# Patient Record
Sex: Female | Born: 1965 | Race: White | Hispanic: No | Marital: Single | Smoking: Current every day smoker
Health system: Southern US, Community
[De-identification: ages and names within clinical notes are randomized; demographics above are authoritative.]

## PROBLEM LIST (undated history)

## (undated) ENCOUNTER — Emergency Department (HOSPITAL_COMMUNITY): Disposition: A | Discharge status: Home / Self Care | Payer: Medicaid Other

## (undated) DIAGNOSIS — M51369 Other intervertebral disc degeneration, lumbar region without mention of lumbar back pain or lower extremity pain: Secondary | ICD-10-CM

## (undated) DIAGNOSIS — J449 Chronic obstructive pulmonary disease, unspecified: Secondary | ICD-10-CM

## (undated) DIAGNOSIS — J45909 Unspecified asthma, uncomplicated: Secondary | ICD-10-CM

## (undated) DIAGNOSIS — M5136 Other intervertebral disc degeneration, lumbar region: Secondary | ICD-10-CM

## (undated) DIAGNOSIS — N200 Calculus of kidney: Secondary | ICD-10-CM

## (undated) DIAGNOSIS — G4700 Insomnia, unspecified: Secondary | ICD-10-CM

## (undated) DIAGNOSIS — Z8711 Personal history of peptic ulcer disease: Secondary | ICD-10-CM

## (undated) DIAGNOSIS — M357 Hypermobility syndrome: Secondary | ICD-10-CM

## (undated) DIAGNOSIS — F419 Anxiety disorder, unspecified: Secondary | ICD-10-CM

## (undated) DIAGNOSIS — K219 Gastro-esophageal reflux disease without esophagitis: Secondary | ICD-10-CM

## (undated) DIAGNOSIS — M5126 Other intervertebral disc displacement, lumbar region: Secondary | ICD-10-CM

## (undated) DIAGNOSIS — M5430 Sciatica, unspecified side: Secondary | ICD-10-CM

## (undated) DIAGNOSIS — K589 Irritable bowel syndrome without diarrhea: Secondary | ICD-10-CM

## (undated) DIAGNOSIS — R531 Weakness: Secondary | ICD-10-CM

## (undated) DIAGNOSIS — M5431 Sciatica, right side: Secondary | ICD-10-CM

## (undated) DIAGNOSIS — M2550 Pain in unspecified joint: Secondary | ICD-10-CM

## (undated) DIAGNOSIS — J189 Pneumonia, unspecified organism: Secondary | ICD-10-CM

## (undated) HISTORY — PX: SHOULDER SURGERY: SHX246

## (undated) HISTORY — PX: TUBAL LIGATION: SHX77

## (undated) HISTORY — PX: ABDOMINAL HYSTERECTOMY: SHX81

## (undated) HISTORY — PX: TONSILLECTOMY: SUR1361

---

## 2007-12-19 ENCOUNTER — Emergency Department: Payer: Self-pay | Admitting: Emergency Medicine

## 2008-01-31 ENCOUNTER — Emergency Department: Payer: Self-pay | Admitting: Emergency Medicine

## 2008-04-19 ENCOUNTER — Emergency Department: Payer: Self-pay | Admitting: Internal Medicine

## 2008-05-17 ENCOUNTER — Emergency Department: Payer: Self-pay | Admitting: Emergency Medicine

## 2008-05-31 ENCOUNTER — Emergency Department: Payer: Self-pay | Admitting: Emergency Medicine

## 2008-06-18 ENCOUNTER — Emergency Department: Payer: Self-pay | Admitting: Emergency Medicine

## 2008-07-25 ENCOUNTER — Ambulatory Visit: Payer: Self-pay

## 2008-12-16 ENCOUNTER — Emergency Department: Payer: Self-pay | Admitting: Emergency Medicine

## 2009-02-01 ENCOUNTER — Emergency Department: Payer: Self-pay | Admitting: Internal Medicine

## 2009-02-21 ENCOUNTER — Observation Stay: Payer: Self-pay | Admitting: Psychiatry

## 2009-02-25 ENCOUNTER — Emergency Department: Payer: Self-pay | Admitting: Emergency Medicine

## 2009-03-24 ENCOUNTER — Emergency Department: Payer: Self-pay | Admitting: Emergency Medicine

## 2009-04-04 ENCOUNTER — Emergency Department: Payer: Self-pay | Admitting: Emergency Medicine

## 2009-11-10 ENCOUNTER — Inpatient Hospital Stay: Payer: Self-pay | Admitting: Surgery

## 2009-11-21 ENCOUNTER — Observation Stay: Payer: Self-pay | Admitting: Internal Medicine

## 2010-10-16 ENCOUNTER — Emergency Department: Payer: Self-pay | Admitting: Internal Medicine

## 2010-12-12 ENCOUNTER — Emergency Department: Payer: Self-pay | Admitting: Emergency Medicine

## 2011-03-19 ENCOUNTER — Emergency Department: Payer: Self-pay | Admitting: Unknown Physician Specialty

## 2011-03-31 ENCOUNTER — Ambulatory Visit: Payer: Self-pay | Admitting: Internal Medicine

## 2011-05-30 ENCOUNTER — Emergency Department: Payer: Self-pay | Admitting: Internal Medicine

## 2011-05-30 LAB — COMPREHENSIVE METABOLIC PANEL
Albumin: 3.8 g/dL (ref 3.4–5.0)
Anion Gap: 13 (ref 7–16)
BUN: 14 mg/dL (ref 7–18)
Bilirubin,Total: 0.4 mg/dL (ref 0.2–1.0)
Chloride: 111 mmol/L — ABNORMAL HIGH (ref 98–107)
Creatinine: 0.76 mg/dL (ref 0.60–1.30)
EGFR (African American): >60
Glucose: 109 mg/dL — ABNORMAL HIGH (ref 65–99)
Osmolality: 288 (ref 275–301)
Potassium: 3.6 mmol/L (ref 3.5–5.1)
Sodium: 144 mmol/L (ref 136–145)
Total Protein: 7 g/dL (ref 6.4–8.2)

## 2011-05-30 LAB — URINALYSIS, COMPLETE
Bacteria: NONE SEEN
Bilirubin,UR: NEGATIVE
Glucose,UR: NEGATIVE mg/dL (ref 0–75)
Ketone: NEGATIVE
Ph: 5 (ref 4.5–8.0)
Protein: 30
RBC,UR: 3 /HPF (ref 0–5)
Specific Gravity: 1.029 (ref 1.003–1.030)
Squamous Epithelial: 3
WBC UR: 1 /HPF (ref 0–5)

## 2011-05-30 LAB — CBC
HCT: 41.6 % (ref 35.0–47.0)
HGB: 14.3 g/dL (ref 12.0–16.0)
MCH: 33.4 pg (ref 26.0–34.0)
RBC: 4.26 10*6/uL (ref 3.80–5.20)
RDW: 12.7 % (ref 11.5–14.5)
WBC: 11.5 10*3/uL — ABNORMAL HIGH (ref 3.6–11.0)

## 2011-05-30 LAB — TROPONIN I: Troponin-I: <0.02 ng/mL

## 2011-06-26 ENCOUNTER — Emergency Department: Payer: Self-pay | Admitting: Emergency Medicine

## 2011-06-26 LAB — COMPREHENSIVE METABOLIC PANEL
Albumin: 4.5 g/dL (ref 3.4–5.0)
Alkaline Phosphatase: 70 U/L (ref 50–136)
Anion Gap: 12 (ref 7–16)
BUN: 11 mg/dL (ref 7–18)
Bilirubin,Total: 0.6 mg/dL (ref 0.2–1.0)
Calcium, Total: 9.4 mg/dL (ref 8.5–10.1)
Co2: 23 mmol/L (ref 21–32)
Creatinine: 0.89 mg/dL (ref 0.60–1.30)
EGFR (African American): >60
EGFR (Non-African Amer.): >60
Glucose: 104 mg/dL — ABNORMAL HIGH (ref 65–99)
Osmolality: 281 (ref 275–301)
Potassium: 3.8 mmol/L (ref 3.5–5.1)
SGOT(AST): 24 U/L (ref 15–37)
SGPT (ALT): 16 U/L
Sodium: 141 mmol/L (ref 136–145)
Total Protein: 8.3 g/dL — ABNORMAL HIGH (ref 6.4–8.2)

## 2011-06-26 LAB — URINALYSIS, COMPLETE
Bilirubin,UR: NEGATIVE
Blood: NEGATIVE
Glucose,UR: NEGATIVE mg/dL (ref 0–75)
Leukocyte Esterase: NEGATIVE
Nitrite: NEGATIVE
Ph: 6 (ref 4.5–8.0)
RBC,UR: <1 /HPF (ref 0–5)

## 2011-06-26 LAB — CBC
HGB: 16.4 g/dL — ABNORMAL HIGH (ref 12.0–16.0)
MCH: 33.3 pg (ref 26.0–34.0)
Platelet: 309 10*3/uL (ref 150–440)
RBC: 4.93 10*6/uL (ref 3.80–5.20)
RDW: 12.6 % (ref 11.5–14.5)
WBC: 18.4 10*3/uL — ABNORMAL HIGH (ref 3.6–11.0)

## 2011-06-26 LAB — PREGNANCY, URINE: Pregnancy Test, Urine: NEGATIVE m[IU]/mL

## 2011-10-26 ENCOUNTER — Inpatient Hospital Stay: Payer: Self-pay | Admitting: Internal Medicine

## 2011-10-26 LAB — ETHANOL
Ethanol %: <0.003 % (ref 0.000–0.080)
Ethanol: <3 mg/dL

## 2011-10-26 LAB — COMPREHENSIVE METABOLIC PANEL
Albumin: 3.8 g/dL (ref 3.4–5.0)
Bilirubin,Total: 0.4 mg/dL (ref 0.2–1.0)
Calcium, Total: 8.5 mg/dL (ref 8.5–10.1)
Chloride: 110 mmol/L — ABNORMAL HIGH (ref 98–107)
Co2: 26 mmol/L (ref 21–32)
Creatinine: 0.77 mg/dL (ref 0.60–1.30)
EGFR (African American): >60
EGFR (Non-African Amer.): >60
SGOT(AST): 44 U/L — ABNORMAL HIGH (ref 15–37)
SGPT (ALT): 17 U/L

## 2011-10-26 LAB — URINALYSIS, COMPLETE
Bacteria: NONE SEEN
Bilirubin,UR: NEGATIVE
Ketone: NEGATIVE
Leukocyte Esterase: NEGATIVE
Ph: 6 (ref 4.5–8.0)
Protein: NEGATIVE
Squamous Epithelial: 1
WBC UR: 2 /HPF (ref 0–5)

## 2011-10-26 LAB — PREGNANCY, URINE: Pregnancy Test, Urine: NEGATIVE m[IU]/mL

## 2011-10-26 LAB — CBC
MCH: 33 pg (ref 26.0–34.0)
MCV: 101 fL — ABNORMAL HIGH (ref 80–100)
Platelet: 295 10*3/uL (ref 150–440)
RDW: 12.8 % (ref 11.5–14.5)
WBC: 9.2 10*3/uL (ref 3.6–11.0)

## 2011-10-26 LAB — TSH: Thyroid Stimulating Horm: 0.975 u[IU]/mL

## 2011-10-26 LAB — DRUG SCREEN, URINE
Amphetamines, Ur Screen: NEGATIVE (ref ?–1000)
Cannabinoid 50 Ng, Ur ~~LOC~~: POSITIVE (ref ?–50)
Cocaine Metabolite,Ur ~~LOC~~: NEGATIVE (ref ?–300)
MDMA (Ecstasy)Ur Screen: NEGATIVE (ref ?–500)
Methadone, Ur Screen: NEGATIVE (ref ?–300)
Tricyclic, Ur Screen: NEGATIVE (ref ?–1000)

## 2011-10-26 LAB — TROPONIN I
Troponin-I: <0.02 ng/mL
Troponin-I: 0.02 ng/mL

## 2011-10-26 LAB — CK TOTAL AND CKMB (NOT AT ARMC): CK-MB: 24.1 ng/mL — ABNORMAL HIGH (ref 0.5–3.6)

## 2011-10-26 LAB — MAGNESIUM: Magnesium: 1.8 mg/dL

## 2011-10-26 LAB — APTT: Activated PTT: 27.1 secs (ref 23.6–35.9)

## 2011-10-27 LAB — TROPONIN I: Troponin-I: 0.06 ng/mL — ABNORMAL HIGH

## 2011-10-27 LAB — CBC WITH DIFFERENTIAL/PLATELET
Basophil #: 0.1 10*3/uL (ref 0.0–0.1)
Basophil %: 0.6 %
Eosinophil #: 0.2 10*3/uL (ref 0.0–0.7)
Eosinophil %: 1.6 %
HCT: 33.5 % — ABNORMAL LOW (ref 35.0–47.0)
Lymphocyte #: 3.1 10*3/uL (ref 1.0–3.6)
Lymphocyte %: 32.1 %
MCH: 34.3 pg — ABNORMAL HIGH (ref 26.0–34.0)
MCHC: 33.7 g/dL (ref 32.0–36.0)
MCV: 102 fL — ABNORMAL HIGH (ref 80–100)
Monocyte #: 0.7 x10 3/mm (ref 0.2–0.9)
Neutrophil #: 5.7 10*3/uL (ref 1.4–6.5)
Platelet: 229 10*3/uL (ref 150–440)
RDW: 12.8 % (ref 11.5–14.5)

## 2011-10-27 LAB — COMPREHENSIVE METABOLIC PANEL
Alkaline Phosphatase: 76 U/L (ref 50–136)
BUN: 7 mg/dL (ref 7–18)
Bilirubin,Total: 0.3 mg/dL (ref 0.2–1.0)
Calcium, Total: 8 mg/dL — ABNORMAL LOW (ref 8.5–10.1)
Chloride: 115 mmol/L — ABNORMAL HIGH (ref 98–107)
Co2: 25 mmol/L (ref 21–32)
Creatinine: 1.02 mg/dL (ref 0.60–1.30)
EGFR (Non-African Amer.): >60
Glucose: 82 mg/dL (ref 65–99)
SGOT(AST): 55 U/L — ABNORMAL HIGH (ref 15–37)
SGPT (ALT): 17 U/L
Total Protein: 5.8 g/dL — ABNORMAL LOW (ref 6.4–8.2)

## 2011-10-28 LAB — LIPID PANEL
Cholesterol: 160 mg/dL (ref 0–200)
HDL Cholesterol: 23 mg/dL — ABNORMAL LOW (ref 40–60)
Ldl Cholesterol, Calc: 68 mg/dL (ref 0–100)
VLDL Cholesterol, Calc: 69 mg/dL — ABNORMAL HIGH (ref 5–40)

## 2011-10-28 LAB — CBC WITH DIFFERENTIAL/PLATELET
Basophil %: 0.5 %
Eosinophil #: 0.5 10*3/uL (ref 0.0–0.7)
Eosinophil %: 5.6 %
HGB: 11.8 g/dL — ABNORMAL LOW (ref 12.0–16.0)
Lymphocyte %: 47.6 %
MCHC: 33.4 g/dL (ref 32.0–36.0)
Monocyte #: 0.6 x10 3/mm (ref 0.2–0.9)
Neutrophil #: 3.6 10*3/uL (ref 1.4–6.5)
Neutrophil %: 39.9 %
Platelet: 237 10*3/uL (ref 150–440)
RDW: 12.6 % (ref 11.5–14.5)

## 2011-10-28 LAB — COMPREHENSIVE METABOLIC PANEL
Albumin: 3.2 g/dL — ABNORMAL LOW (ref 3.4–5.0)
Alkaline Phosphatase: 117 U/L (ref 50–136)
Anion Gap: 7 (ref 7–16)
BUN: 12 mg/dL (ref 7–18)
Calcium, Total: 8.5 mg/dL (ref 8.5–10.1)
Co2: 25 mmol/L (ref 21–32)
Creatinine: 1.01 mg/dL (ref 0.60–1.30)
EGFR (Non-African Amer.): >60
Glucose: 86 mg/dL (ref 65–99)
SGOT(AST): 54 U/L — ABNORMAL HIGH (ref 15–37)
SGPT (ALT): 26 U/L
Total Protein: 6.4 g/dL (ref 6.4–8.2)

## 2011-10-28 LAB — CK: CK, Total: 985 U/L — ABNORMAL HIGH (ref 21–215)

## 2011-10-29 LAB — CK: CK, Total: 530 U/L — ABNORMAL HIGH (ref 21–215)

## 2011-10-29 LAB — TROPONIN I: Troponin-I: 0.02 ng/mL

## 2011-10-29 LAB — HEPATIC FUNCTION PANEL A (ARMC)
Albumin: 3.1 g/dL — ABNORMAL LOW (ref 3.4–5.0)
Alkaline Phosphatase: 99 U/L (ref 50–136)
SGOT(AST): 53 U/L — ABNORMAL HIGH (ref 15–37)
Total Protein: 6.3 g/dL — ABNORMAL LOW (ref 6.4–8.2)

## 2011-10-30 LAB — SGOT (AST)(ARMC): SGOT(AST): 48 U/L — ABNORMAL HIGH (ref 15–37)

## 2011-10-30 LAB — CK: CK, Total: 682 U/L — ABNORMAL HIGH (ref 21–215)

## 2011-10-31 ENCOUNTER — Inpatient Hospital Stay: Payer: Self-pay | Admitting: Psychiatry

## 2011-10-31 LAB — SGOT (AST)(ARMC): SGOT(AST): 103 U/L — ABNORMAL HIGH (ref 15–37)

## 2011-11-14 ENCOUNTER — Emergency Department: Payer: Self-pay | Admitting: *Deleted

## 2011-11-14 LAB — BASIC METABOLIC PANEL
Anion Gap: 6 — ABNORMAL LOW (ref 7–16)
Calcium, Total: 8.3 mg/dL — ABNORMAL LOW (ref 8.5–10.1)
Chloride: 111 mmol/L — ABNORMAL HIGH (ref 98–107)
Co2: 26 mmol/L (ref 21–32)
Creatinine: 0.72 mg/dL (ref 0.60–1.30)
EGFR (African American): >60
EGFR (Non-African Amer.): >60
Osmolality: 283 (ref 275–301)

## 2011-11-14 LAB — CBC
MCV: 101 fL — ABNORMAL HIGH (ref 80–100)
Platelet: 308 10*3/uL (ref 150–440)
RBC: 3.56 10*6/uL — ABNORMAL LOW (ref 3.80–5.20)
RDW: 12.7 % (ref 11.5–14.5)
WBC: 9.2 10*3/uL (ref 3.6–11.0)

## 2011-11-14 LAB — CK TOTAL AND CKMB (NOT AT ARMC)
CK, Total: 66 U/L (ref 21–215)
CK-MB: 0.5 ng/mL (ref 0.5–3.6)

## 2011-11-14 LAB — TROPONIN I: Troponin-I: <0.02 ng/mL

## 2012-02-06 ENCOUNTER — Emergency Department: Payer: Self-pay | Admitting: Emergency Medicine

## 2012-02-06 LAB — COMPREHENSIVE METABOLIC PANEL
Anion Gap: 8 (ref 7–16)
BUN: 12 mg/dL (ref 7–18)
Chloride: 107 mmol/L (ref 98–107)
Co2: 24 mmol/L (ref 21–32)
Creatinine: 0.74 mg/dL (ref 0.60–1.30)
EGFR (African American): >60
EGFR (Non-African Amer.): >60
Potassium: 4 mmol/L (ref 3.5–5.1)
SGOT(AST): 18 U/L (ref 15–37)
SGPT (ALT): 19 U/L (ref 12–78)
Total Protein: 8.3 g/dL — ABNORMAL HIGH (ref 6.4–8.2)

## 2012-02-06 LAB — CBC
HCT: 44.7 % (ref 35.0–47.0)
HGB: 15.5 g/dL (ref 12.0–16.0)
MCH: 33.4 pg (ref 26.0–34.0)
MCHC: 34.7 g/dL (ref 32.0–36.0)
MCV: 96 fL (ref 80–100)

## 2012-02-06 LAB — URINALYSIS, COMPLETE
Blood: NEGATIVE
Glucose,UR: NEGATIVE mg/dL (ref 0–75)
Ketone: NEGATIVE
Leukocyte Esterase: NEGATIVE
Nitrite: NEGATIVE
Ph: 7 (ref 4.5–8.0)
Protein: NEGATIVE
RBC,UR: 1 /HPF (ref 0–5)

## 2012-02-09 ENCOUNTER — Emergency Department: Payer: Self-pay | Admitting: Unknown Physician Specialty

## 2012-02-09 LAB — URINALYSIS, COMPLETE
Bacteria: NONE SEEN
Bilirubin,UR: NEGATIVE
Glucose,UR: NEGATIVE mg/dL (ref 0–75)
Ketone: NEGATIVE
Nitrite: NEGATIVE
Specific Gravity: 1.015 (ref 1.003–1.030)
WBC UR: 1 /HPF (ref 0–5)

## 2012-02-09 LAB — COMPREHENSIVE METABOLIC PANEL
Albumin: 4 g/dL (ref 3.4–5.0)
Anion Gap: 7 (ref 7–16)
Calcium, Total: 8.8 mg/dL (ref 8.5–10.1)
Co2: 26 mmol/L (ref 21–32)
Creatinine: 0.87 mg/dL (ref 0.60–1.30)
EGFR (African American): >60
EGFR (Non-African Amer.): >60
Glucose: 133 mg/dL — ABNORMAL HIGH (ref 65–99)
SGOT(AST): 17 U/L (ref 15–37)
SGPT (ALT): 14 U/L (ref 12–78)
Sodium: 143 mmol/L (ref 136–145)
Total Protein: 7.5 g/dL (ref 6.4–8.2)

## 2012-02-09 LAB — LIPASE, BLOOD: Lipase: 85 U/L (ref 73–393)

## 2012-02-09 LAB — CBC
MCH: 32.9 pg (ref 26.0–34.0)
MCHC: 34.2 g/dL (ref 32.0–36.0)
MCV: 96 fL (ref 80–100)
RBC: 4.59 10*6/uL (ref 3.80–5.20)
RDW: 12.8 % (ref 11.5–14.5)

## 2013-09-21 ENCOUNTER — Emergency Department: Payer: Self-pay | Admitting: Emergency Medicine

## 2013-09-21 LAB — URINALYSIS, COMPLETE
BACTERIA: NONE SEEN
Bilirubin,UR: NEGATIVE
Blood: NEGATIVE
GLUCOSE, UR: NEGATIVE mg/dL (ref 0–75)
Ketone: NEGATIVE
Leukocyte Esterase: NEGATIVE
Nitrite: NEGATIVE
PROTEIN: NEGATIVE
Ph: 6 (ref 4.5–8.0)
RBC,UR: <1 /HPF (ref 0–5)
Specific Gravity: 1.009 (ref 1.003–1.030)

## 2014-01-15 ENCOUNTER — Ambulatory Visit: Payer: Self-pay | Admitting: Otolaryngology

## 2014-03-03 ENCOUNTER — Emergency Department: Payer: Self-pay | Admitting: Emergency Medicine

## 2014-03-03 LAB — CBC
HCT: 42.9 % (ref 35.0–47.0)
HGB: 14.5 g/dL (ref 12.0–16.0)
MCH: 33.5 pg (ref 26.0–34.0)
MCHC: 33.8 g/dL (ref 32.0–36.0)
MCV: 99 fL (ref 80–100)
Platelet: 392 10*3/uL (ref 150–440)
RBC: 4.33 10*6/uL (ref 3.80–5.20)
RDW: 13 % (ref 11.5–14.5)
WBC: 9.7 10*3/uL (ref 3.6–11.0)

## 2014-03-03 LAB — COMPREHENSIVE METABOLIC PANEL
ALBUMIN: 3.8 g/dL (ref 3.4–5.0)
Alkaline Phosphatase: 92 U/L
Anion Gap: 10 (ref 7–16)
BILIRUBIN TOTAL: 0.6 mg/dL (ref 0.2–1.0)
BUN: 14 mg/dL (ref 7–18)
CHLORIDE: 109 mmol/L — AB (ref 98–107)
CO2: 24 mmol/L (ref 21–32)
Calcium, Total: 8.7 mg/dL (ref 8.5–10.1)
Creatinine: 0.73 mg/dL (ref 0.60–1.30)
EGFR (Non-African Amer.): >60
Glucose: 107 mg/dL — ABNORMAL HIGH (ref 65–99)
OSMOLALITY: 286 (ref 275–301)
POTASSIUM: 3.9 mmol/L (ref 3.5–5.1)
SGOT(AST): 29 U/L (ref 15–37)
SGPT (ALT): 18 U/L
SODIUM: 143 mmol/L (ref 136–145)
Total Protein: 7.3 g/dL (ref 6.4–8.2)

## 2014-03-03 LAB — LIPASE, BLOOD: Lipase: 120 U/L (ref 73–393)

## 2014-03-05 ENCOUNTER — Emergency Department: Payer: Self-pay | Admitting: Emergency Medicine

## 2014-03-05 LAB — URINALYSIS, COMPLETE
BILIRUBIN, UR: NEGATIVE
BLOOD: NEGATIVE
GLUCOSE, UR: NEGATIVE mg/dL (ref 0–75)
Ketone: NEGATIVE
LEUKOCYTE ESTERASE: NEGATIVE
Nitrite: NEGATIVE
PROTEIN: NEGATIVE
Ph: 6 (ref 4.5–8.0)
RBC,UR: 2 /HPF (ref 0–5)
Specific Gravity: 1.017 (ref 1.003–1.030)
WBC UR: 2 /HPF (ref 0–5)

## 2014-03-05 LAB — COMPREHENSIVE METABOLIC PANEL
Albumin: 4 g/dL (ref 3.4–5.0)
Alkaline Phosphatase: 86 U/L
Anion Gap: 8 (ref 7–16)
BUN: 14 mg/dL (ref 7–18)
Bilirubin,Total: 0.5 mg/dL (ref 0.2–1.0)
Calcium, Total: 8.7 mg/dL (ref 8.5–10.1)
Chloride: 107 mmol/L (ref 98–107)
Co2: 24 mmol/L (ref 21–32)
Creatinine: 0.89 mg/dL (ref 0.60–1.30)
EGFR (African American): >60
EGFR (Non-African Amer.): >60
Glucose: 138 mg/dL — ABNORMAL HIGH (ref 65–99)
Osmolality: 280 (ref 275–301)
Potassium: 3.4 mmol/L — ABNORMAL LOW (ref 3.5–5.1)
SGOT(AST): 9 U/L — ABNORMAL LOW (ref 15–37)
SGPT (ALT): 16 U/L
Sodium: 139 mmol/L (ref 136–145)
Total Protein: 7.1 g/dL (ref 6.4–8.2)

## 2014-03-05 LAB — CBC WITH DIFFERENTIAL/PLATELET
BASOS ABS: 0.1 10*3/uL (ref 0.0–0.1)
Basophil %: 0.8 %
EOS PCT: 0.2 %
Eosinophil #: 0 10*3/uL (ref 0.0–0.7)
HCT: 42.9 % (ref 35.0–47.0)
HGB: 14.3 g/dL (ref 12.0–16.0)
LYMPHS ABS: 2.7 10*3/uL (ref 1.0–3.6)
Lymphocyte %: 20.3 %
MCH: 32.8 pg (ref 26.0–34.0)
MCHC: 33.3 g/dL (ref 32.0–36.0)
MCV: 99 fL (ref 80–100)
MONO ABS: 0.8 x10 3/mm (ref 0.2–0.9)
Monocyte %: 5.6 %
NEUTROS ABS: 9.9 10*3/uL — AB (ref 1.4–6.5)
Neutrophil %: 73.1 %
PLATELETS: 426 10*3/uL (ref 150–440)
RBC: 4.35 10*6/uL (ref 3.80–5.20)
RDW: 12.8 % (ref 11.5–14.5)
WBC: 13.6 10*3/uL — ABNORMAL HIGH (ref 3.6–11.0)

## 2014-03-05 LAB — LIPASE, BLOOD: Lipase: 77 U/L (ref 73–393)

## 2014-03-07 ENCOUNTER — Inpatient Hospital Stay: Payer: Self-pay | Admitting: Internal Medicine

## 2014-03-07 LAB — COMPREHENSIVE METABOLIC PANEL
ALBUMIN: 3.9 g/dL (ref 3.4–5.0)
Alkaline Phosphatase: 83 U/L
Anion Gap: 10 (ref 7–16)
BILIRUBIN TOTAL: 0.8 mg/dL (ref 0.2–1.0)
BUN: 13 mg/dL (ref 7–18)
CO2: 23 mmol/L (ref 21–32)
Calcium, Total: 8.8 mg/dL (ref 8.5–10.1)
Chloride: 110 mmol/L — ABNORMAL HIGH (ref 98–107)
Creatinine: 1.1 mg/dL (ref 0.60–1.30)
GFR CALC NON AF AMER: 57 — AB
Glucose: 103 mg/dL — ABNORMAL HIGH (ref 65–99)
Osmolality: 285 (ref 275–301)
Potassium: 3.2 mmol/L — ABNORMAL LOW (ref 3.5–5.1)
SGOT(AST): 49 U/L — ABNORMAL HIGH (ref 15–37)
SGPT (ALT): 18 U/L
SODIUM: 143 mmol/L (ref 136–145)
TOTAL PROTEIN: 6.8 g/dL (ref 6.4–8.2)

## 2014-03-07 LAB — URINALYSIS, COMPLETE
BLOOD: NEGATIVE
Bilirubin,UR: NEGATIVE
Glucose,UR: NEGATIVE mg/dL (ref 0–75)
Ketone: NEGATIVE
Leukocyte Esterase: NEGATIVE
Nitrite: NEGATIVE
PH: 5 (ref 4.5–8.0)
Protein: NEGATIVE
RBC,UR: 1 /HPF (ref 0–5)
Specific Gravity: 1.008 (ref 1.003–1.030)
WBC UR: <1 /HPF (ref 0–5)

## 2014-03-07 LAB — DRUG SCREEN, URINE
Amphetamines, Ur Screen: NEGATIVE (ref ?–1000)
BARBITURATES, UR SCREEN: POSITIVE (ref ?–200)
Benzodiazepine, Ur Scrn: POSITIVE (ref ?–200)
CANNABINOID 50 NG, UR ~~LOC~~: NEGATIVE (ref ?–50)
Cocaine Metabolite,Ur ~~LOC~~: NEGATIVE (ref ?–300)
MDMA (ECSTASY) UR SCREEN: NEGATIVE (ref ?–500)
Methadone, Ur Screen: NEGATIVE (ref ?–300)
Opiate, Ur Screen: NEGATIVE (ref ?–300)
Phencyclidine (PCP) Ur S: NEGATIVE (ref ?–25)
TRICYCLIC, UR SCREEN: NEGATIVE (ref ?–1000)

## 2014-03-07 LAB — CBC WITH DIFFERENTIAL/PLATELET
Basophil #: 0.1 10*3/uL (ref 0.0–0.1)
Basophil %: 0.4 %
Eosinophil #: 0 10*3/uL (ref 0.0–0.7)
Eosinophil %: 0.1 %
HCT: 41.9 % (ref 35.0–47.0)
HGB: 13.9 g/dL (ref 12.0–16.0)
LYMPHS ABS: 1.4 10*3/uL (ref 1.0–3.6)
Lymphocyte %: 6.9 %
MCH: 32.8 pg (ref 26.0–34.0)
MCHC: 33.3 g/dL (ref 32.0–36.0)
MCV: 99 fL (ref 80–100)
Monocyte #: 1.1 x10 3/mm — ABNORMAL HIGH (ref 0.2–0.9)
Monocyte %: 5.5 %
Neutrophil #: 18 10*3/uL — ABNORMAL HIGH (ref 1.4–6.5)
Neutrophil %: 87.1 %
PLATELETS: 304 10*3/uL (ref 150–440)
RBC: 4.25 10*6/uL (ref 3.80–5.20)
RDW: 13.1 % (ref 11.5–14.5)
WBC: 20.6 10*3/uL — ABNORMAL HIGH (ref 3.6–11.0)

## 2014-03-07 LAB — SALICYLATE LEVEL: Salicylates, Serum: 2.7 mg/dL

## 2014-03-07 LAB — MAGNESIUM: Magnesium: 1.6 mg/dL — ABNORMAL LOW

## 2014-03-07 LAB — ACETAMINOPHEN LEVEL

## 2014-03-07 LAB — ETHANOL: Ethanol: <3 mg/dL

## 2014-03-08 LAB — CBC WITH DIFFERENTIAL/PLATELET
BASOS ABS: 0.1 10*3/uL (ref 0.0–0.1)
Basophil %: 0.8 %
EOS PCT: 1.7 %
Eosinophil #: 0.2 10*3/uL (ref 0.0–0.7)
HCT: 34.9 % — AB (ref 35.0–47.0)
HGB: 11.9 g/dL — AB (ref 12.0–16.0)
Lymphocyte #: 2.8 10*3/uL (ref 1.0–3.6)
Lymphocyte %: 29.3 %
MCH: 33.6 pg (ref 26.0–34.0)
MCHC: 34.1 g/dL (ref 32.0–36.0)
MCV: 99 fL (ref 80–100)
MONO ABS: 0.7 x10 3/mm (ref 0.2–0.9)
MONOS PCT: 7.2 %
Neutrophil #: 5.8 10*3/uL (ref 1.4–6.5)
Neutrophil %: 61 %
Platelet: 272 10*3/uL (ref 150–440)
RBC: 3.53 10*6/uL — ABNORMAL LOW (ref 3.80–5.20)
RDW: 13.1 % (ref 11.5–14.5)
WBC: 9.5 10*3/uL (ref 3.6–11.0)

## 2014-03-08 LAB — BASIC METABOLIC PANEL
Anion Gap: 6 — ABNORMAL LOW (ref 7–16)
BUN: 10 mg/dL (ref 7–18)
CREATININE: 1.18 mg/dL (ref 0.60–1.30)
Calcium, Total: 7.5 mg/dL — ABNORMAL LOW (ref 8.5–10.1)
Chloride: 117 mmol/L — ABNORMAL HIGH (ref 98–107)
Co2: 27 mmol/L (ref 21–32)
EGFR (African American): >60
GFR CALC NON AF AMER: 52 — AB
Glucose: 103 mg/dL — ABNORMAL HIGH (ref 65–99)
Osmolality: 297 (ref 275–301)
Potassium: 2.9 mmol/L — ABNORMAL LOW (ref 3.5–5.1)
SODIUM: 150 mmol/L — AB (ref 136–145)

## 2014-03-08 LAB — POTASSIUM: Potassium: 3.3 mmol/L — ABNORMAL LOW (ref 3.5–5.1)

## 2014-03-08 LAB — MAGNESIUM: Magnesium: 1.9 mg/dL

## 2014-06-01 ENCOUNTER — Ambulatory Visit: Payer: Self-pay | Admitting: Orthopedic Surgery

## 2014-08-18 NOTE — Discharge Summary (Signed)
PATIENT NAMRandall [name]:  [name], Shawna MR#:  [id number] DATE OF BIRTH:  01/07/1966  DATE OF ADMISSION:  03/07/2014 DATE OF DISCHARGE:  03/08/2014  CHIEF COMPLAINT AT THE TIME OF ADMISSION: Altered mental status.   ADMITTING DIAGNOSES:  1.  Acute encephalopathy due to drug overdose.  2.  Hypokalemia.  3.  Hypomagnesemia.  4.  History of anxiety and depression.  5.  Tobacco abuse.  DISCHARGE DIAGNOSES:  1.  Acute encephalopathy secondary to intentional drug overdose, resolved.  2.  Hypokalemia, repleted.  3.  Hypomagnesemia, repleted.  4.  History of anxiety and depression.  5.  Tobacco use.   CONSULTATIONS: Psychiatry.   PROCEDURES: None.   BRIEF HISTORY AND PHYSICAL AND HOSPITAL COURSE: The patient is a 49 year old Caucasian female with history of anxiety and depression who was sent into the Emergency Department for altered mental status and confusion. The patient denies any suicidal ideation at the time of admission. Please see history and physical for details. The patient stated that she took only 1 pill of Xanax and Ambien, but according to the patient's daughter the patient recently had a new prescription for Xanax and Ambien, and both bottles were empty. The patient was admitted to the hospital with acute encephalopathy from drug overdose. She was given IV fluids, placed on aspiration and fall precautions. Xanax and Ambien were discontinued.   The patient was observed overnight. IV fluids were continued. Today, her encephalopathy is resolved and patient is back to her normal state. She started ambulating in the hallway. Her diet was advanced. The patient tolerated the diet very well. The patient denies any depression. She denies any suicidal or homicidal ideation. She claimed that she took 12 pills of Xanax and 2 pills of Ambien to get rid of her tinnitus which is chronic in nature. According to her daughter, the patient did drug overdose in the past intentionally and she was concerned about  repetition of this. Psychiatric consult was placed. The patient was evaluated by Dr. Toni Amendlapacs. According to Dr. Toni Amendlapacs' interview, patient is clearly abusing Xanax. So, he has recommended to discontinue Xanax and Ambien. The patient is advised to follow up with [facility] as an outpatient regarding SA. The patient is to follow up with Dr. Raynald KempHsu, her psychiatrist, as an outpatient as soon as possible.   Hypokalemia, resolved with potassium supplements, as well as IV fluids with potassium.   Hypomagnesemia is repleted with magnesium supplementation. Her leukocytosis seemed to be from stress induce. The patient is cleared from psychiatric standpoint to be discharged and patient is discharged home in stable condition.   CONDITION AT THE TIME OF DISCHARGE: Stable .  VITAL SIGNS: Temperature 98 degrees Fahrenheit, pulse 82, respirations 20, blood pressure 116/78, pulse oximetry is 98% on room air.   LABORATORIES AND IMAGING STUDIES: Repeat potassium was 3.3. BUN and creatinine are normal. Serum osmolality is normal. Calcium is 7.5. Urine drug screen is positive for barbiturates and benzodiazepines. WBC 9.5, today, hemoglobin 11.9, hematocrit is 34.9, platelet count is normal. Urinalysis: Nitrites and leukocyte esterase are negative. Serum acetaminophen level is less than 2 and salicylate level is 2.7. Telemetry: Normal sinus rhythm, heart rate fluctuating between 60s to 70s. CT head without contrast and also Ct of the cervical spine multifocal paranasal sinus disease. Study otherwise unremarkable. No acute infarct apparent. CT cervical spine has revealed mild osteoarthritic changes. No fracture or spondylolisthesis.   MEDICATIONS AT THE TIME OF DISCHARGE: Omeprazole 40 mg p.o. once daily, hydroxyzine hydrochloride 50 mg 1 tablet p.o.  4 times a day as needed for anxiety and nervousness, Zofran over the counter 8 mg 3 times a day as needed for nausea and vomiting, dicyclomine 20 mg 1 tablet p.o. every 8 hours as needed  for diarrhea, loratadine 10 mg p.o. once daily at bedtime, meclizine 25 mg 1 tablet p.o. 3 times a day as needed for vertigo. Discontinued zolpidem and alprazolam as patient overdosed these medications and is abusing them.   DIET: Low sodium.   ACTIVITY: As tolerated. Return to work after following up with MD.   Elenor Quinones APPOINTMENTS: With primary care physician in 1 week and Dr. Raynald Kemp, psychiatrist, as an outpatient in 1 to 2 days or sooner as needed.   The diagnosis and plan of care were discussed in detail with the patient and her daughter at bedside. They both verbalized understanding of the plan.   TOTAL TIME SPENT ON DISCHARGE: 45 minutes.   ____________________________ Ramonita Lab, MD ag:am D: 03/08/2014 20:55:33 ET T: 03/08/2014 22:40:44 ET JOB#: [id number]  cc: Ramonita Lab, MD, <Dictator> Ramonita Lab MD ELECTRONICALLY SIGNED 03/14/2014 22:13

## 2014-08-18 NOTE — H&P (Signed)
PATIENT NAMECASSIE, [name] MR#:  [id number] DATE OF BIRTH:  1965/06/15  DATE OF ADMISSION:  03/07/2014  PRIMARY CARE PHYSICIAN:  Nonlocal.   REFERRING PHYSICIAN:  Anika S. Valentino Saxon, MD.  CHIEF COMPLAINT:  Altered mental status.   HISTORY OF PRESENT ILLNESS: This is a 49 year old Caucasian female with a history of anxiety and depression, was sent to the ED due to altered mental status and confusion. The patient now is alert, awake but confused. The patient denies any symptoms. Denies any anxiety, depression or suicidal ideation. She said she only takes 1 tab Xanax p.o. and Ambien, but according to the patient's daughter, the patient recently had a new prescription of her Xanax and Ambien, 1 bottle each, which are empty today.   PAST MEDICAL HISTORY:  Vertigo, pneumonia, GERD,  asthma, anxiety, depression, arthritis, bulging disc.   PAST SURGICAL HISTORY: Tonsillectomy, hysterectomy, tubal ligation, left rotator cuff surgery.   FAMILY HISTORY: Diabetes, hypertension, CAD in her mother, father died at a very young age.     Social  history:  the patient has smoked 1/2 pack a day for many years.  Denies any alcohol drinking or illicit drugs.    ALLERGIES:  AN ANTIBIOTIC THAT STARTS WITH "E," MORPHINE, TALWIN.   HOME MEDICATIONS: Ambien 10 mg p.o. at bedtime p.r.n., Zofran 8 mg p.o. 3 times a day p.r.n., omeprazole 40 mg p.o. once a day, magnesium 25 mg p.o. 3 times a day, loratadine 10 mg p.o. once a day at bedtime, hydroxyzine hydrochloride 50 mg p.o. 4 times a day p.r.n. for anxiety, dicyclomine 20 mg p.o. every 8 hours p.r.n. for diarrhea, Xanax 1 mg 1 tablet 3-4 times a day p.r.n. for anxiety.   REVIEW OF SYSTEMS: The patient is confused, denies any symptoms. Unreliable.  PHYSICAL EXAMINATION:  VITAL SIGNS: Temperature 98.2, blood pressure 105/89, pulse 81, oxygen saturation 96% on room air.  GENERAL: The patient is alert, awake, confused, in no acute distress.  HEENT: Pupils round and  equal and reactive to light and accommodation. Dry oral mucosa. Clear oropharynx.  NECK: Supple. No JVD or carotid bruits. No lymphadenopathy. No thyromegaly.  CARDIOVASCULAR: S1 and S2. Regular rate and rhythm. No murmurs or gallops.  PULMONARY: Bilateral air entry. No wheezing or rales. No use of accessory muscles to breathe.  ABDOMEN: Soft. No distention or tenderness. No organomegaly. Bowel sounds present.  EXTREMITIES: No edema, clubbing or cyanosis. No calf tenderness. Bilateral pedal pulses present.   NEUROLOGIC: Alert and oriented x 3, but confused, follows commands. No focal deficit. Power 4/5. Sensation intact but the patient has slurred speech.   LABORATORY DATA: WBC 20.6, hemoglobin 13.9, platelet 304,000, glucose 103, BUN 13, creatinine 1.1, sodium 143, potassium 3.2, chloride 110, bicarbonate 23, magnesium 1.6, SGPT 18, SGOT 89, acetaminophen and less than 2, salicylates 2.7.   Urine toxicology showed barbiturate positive, benzodiazepine positive. Urinalysis is negative.   IMAGING:  A CAT scan of head showed multifocal paranasal sinus disease, otherwise unremarkable cervical spine. No fracture. Mild osteoarthritic changes.   Chest x-ray: No edema or consolidation.   EKG shows normal sinus rhythm at 83 beats per minute with PVC.   IMPRESSION:  1.  Acute encephalopathy due to drug overdose.  2.  Hypokalemia.  3.  Hypomagnesemia.  4.  History of anxiety and depression,  5.  Tobacco abuse  PLAN OF TREATMENT:  1.  The patient will be admitted to the medical floor and will start telemetry. Give IV fluid support. Keep n.p.o. except  medications. Aspiration, fall, and seizure precautions.  2.  Hold the patient's home medications, Xanax and Ambien. Watch for withdrawl. 3.  Leukocytosis,  possibly reactive. We will follow up CBC.  4.  For hypokalemia, we will give IV normal saline with potassium and follow up potassium level.  5.  For hypomagnesemia, will give magnesium and follow up  level.  6.  I discussed the patient's condition and plan of treatment with the patient and the patient's daughter.  As the patient is confused she cannot make decisions. The patient's daughter says that she is the patient's health power of attorney. She wants full code for the patient.  7. Smoking cessation was counselled for 4 min. nicotine patch.  TIME SPENT: About 62 minutes.    ____________________________ Shaune PollackQing Jeweliana Dudgeon, MD qc:lt D: 03/07/2014 12:06:34 ET T: 03/07/2014 14:28:38 ET JOB#: [id number]  cc: Shaune PollackQing Rashaunda Rahl, MD, <Dictator> Shaune PollackQING Iqra Rotundo MD ELECTRONICALLY SIGNED 03/07/2014 19:37

## 2014-08-18 NOTE — Consult Note (Signed)
PATIENT NAMRandall Hiss:  Copes, Salia MR#:  [id number] DATE OF BIRTH:  Jan 24, 1966  DATE OF CONSULTATION:  03/08/2014  CONSULTING PHYSICIAN:  Audery AmelJohn T. Clapacs, MD  IDENTIFYING INFORMATION AND REASON FOR CONSULTATION:  A 49 year old woman brought in to the hospital with altered mental status and presumed overdose on prescription medicine.   CHIEF COMPLAINT:  "They said I took pills."   HISTORY OF PRESENT ILLNESS:  Information is obtained from the patient and the chart. She was brought to the hospital yesterday with an altered mental status and family reporting that they found both her Xanax bottle and her Ambien bottle empty. The patient claims that she remembers taking the pills and says that she thinks she took 12 Xanax pills and 2 or 3 of the Ambiens. Her reason for doing this is that she wanted to stop the ringing in her ears. She totally denies that she was having any suicidal ideation. She admits that her mood is a little bit down at times, but denies feeling actually depressed. She does not feel hopeless. She says that she sleeps okay with her current medicine. She does get tired during the day. She denies any suicidal thoughts. Denies any hallucinations or psychotic thoughts. She complains that she has ringing in her ears and says that she is planning to have surgery for it in the next couple of weeks. She claims that usually she is compliant with her Xanax at 1 mg 4 times a day. Denies that she is using alcohol. Denies that she is using any other drugs. Family reports that they think that she overuses her medication frequently. They are concerned also that she had been more depressed recently and had been a couple of weeks ago making vague statements about suicidality.   PAST PSYCHIATRIC HISTORY:  The patient has had previous hospitalizations for overdose of medication. She was last on the unit at our hospital in July 2013, at which time she was diagnosed with mood disorder, not otherwise specified, and anxiety  disorder, not otherwise specified, status post an overdose. At that time, she was taking Tegretol and Effexor. Recently, she says that she is only taking Xanax and says that she cannot remember ever being prescribed any other psychiatric medicine. The children of the patient metastatic me out in the hallway after the interview and report that they have witnessed her overdose on her medicines several times over the course of 20 years.   FAMILY HISTORY:  The patient says there are several people in her family who have psychiatric problems; it is vaguely described.   SOCIAL HISTORY:  She is living in a boarding house currently. She has adult children who look in on her daily and are trying to keep an eye on her. The patient is not working. She has a limited social life.   PAST MEDICAL HISTORY:  She says that she has chronic ringing in her ears. She also has some kind of vague complaints about her eye and says that she has been told that her sinuses were so clogged up that they might cause her to go blind.   MEDICATIONS:  However, seem to primarily be Ambien, Xanax, hydroxyzine, omeprazole, and some supplements.   SUBSTANCE ABUSE HISTORY:  She denies alcohol use. Denies that she uses any other drugs. I noted that there are barbiturates in her drug screen. There is no record of her being prescribed barbiturates. I inquired as to whether she might have taken some Fioricet, which are a common cause of positive  barbiturate screens. The patient tells me that she could not be sure that she did not take such a medicine.   REVIEW OF SYSTEMS:  Denies suicidal ideation. Denies hallucinations. Mood is slightly down, but she will not describe herself as depressed. She has chronic ringing in her ears. She feels weak. Otherwise, negative review of systems.   MENTAL STATUS EXAMINATION:  A somewhat disheveled and very thin woman who looks older than her stated age. She is cooperative with the interview, but insists that  her family stay in the room. She is somewhat passive at times and tends to answer questions minimally. Eye contact is good. Psychomotor activity is very limited and guarded. Speech is normal rate, tone, and volume. Affect is flat. Mood is stated as okay. Thoughts are lucid without any obvious loosening of associations. Denies auditory or visual hallucinations. Denies suicidal or homicidal ideation. She can recall 3/3 objects immediately and 2/3 at 3 minutes. Alert and oriented x 4. Judgment and insight about her medication use is probably somewhat impaired, but otherwise right now she seems to be in her right mind and understanding her situation.   LABORATORY RESULTS:  Labs done yesterday included a drug screen positive for benzodiazepines and barbiturates. Magnesium is low at 1.6. Chemistry panel:  Low potassium at 3.2, otherwise unremarkable. CBC is showing an elevated white count at 20, otherwise unremarkable. Urinalysis is unremarkable.   VITAL SIGNS:  Blood pressure was 116/78, respirations 20, pulse 82, temperature 98.   ASSESSMENT:  This is a 49 year old woman with a history of anxiety and past history of depression, who is currently being prescribed Xanax 1 mg 4 times a day by Dr. Janeece Riggers. The evidence would strongly suggest that she took a very large number of her Xanax. The documentation is that she just got it filled with 120 pills on November 4, and now she presents with an empty bottle. The family states that they do not believe that she took only 12 either. The patient, however, was not put under involuntary commitment at the time she came in to the Emergency Room and at this point is denying suicidality and does not appear to be psychotic or unable to take care of herself. The patient is not interested in any kind of hospital treatment for substance abuse.   TREATMENT PLAN:  The patient was educated about the dangers of use of benzodiazepine as well as combinations of benzodiazepine with other  sedatives. We pointed out to her that these sort of things could result in a fatal overdose. She was strongly encouraged as was her family to talk to Dr. Janeece Riggers about her continued medicine use and whether this is still appropriate. The children additionally were asking about resources for getting her into substance abuse treatment. I advised them that [facility] would be the best place to start for outpatient treatment, and I have asked social work to give them that referral information before they leave.   DIAGNOSIS, PRINCIPAL AND PRIMARY:  AXIS I:  Delirium due to sedative intoxication, resolved.   SECONDARY DIAGNOSES: AXIS I:   1.  Sedative abuse. 2.  Depression, not otherwise specified.     ____________________________ Audery Amel, MD jtc:nb D: 03/08/2014 22:50:32 ET T: 03/08/2014 23:28:42 ET JOB#: [id number]  cc: Audery Amel, MD, <Dictator> Audery Amel MD ELECTRONICALLY SIGNED 03/12/2014 17:03

## 2014-08-18 NOTE — Consult Note (Signed)
Brief Consult Note: Diagnosis: sedative abuse/ delirium from sedative intoxication, resolved.   Patient was seen by consultant.   Orders entered.   Discussed with Attending MD.   Comments: PSychiatry: PAtient seen and chart reviewed. Spoke with family and qwith hospitalist. Note done. Patient clearly abuses xanax. Not currently commitable. Encourage her to consider the dangers and to work on changing behavior. Will give referral to [facility] to consider SA treatment. Otherwise no need to detain pt in hospital.  Electronic Signatures: Clapacs, Jackquline DenmarkJohn T (MD)  (Signed 12-Nov-15 16:09)  Authored: Brief Consult Note   Last Updated: 12-Nov-15 16:09 by Audery Amellapacs, John T (MD)

## 2014-08-19 NOTE — Consult Note (Signed)
Brief Consult Note: Diagnosis: mood disorder NOS, S/P intentional overdose.   Patient was seen by consultant.   Comments: [name] OD on medications and left a suicide note. She is too sedated to be properly interviewed. She is NOT glad to be alive.   PLAN: 1. Please continue sitter.  2. Will atempt to interview tomorrow.   3. [facility].  Electronic Signatures: Kristine LineaPucilowska, Tashawn Laswell (MD)  (Signed 01-Jul-13 18:43)  Authored: Brief Consult Note   Last Updated: 01-Jul-13 18:43 by Kristine LineaPucilowska, Nysha Koplin (MD)

## 2014-08-19 NOTE — Consult Note (Signed)
PATIENT NAMRandall [name]:  [name], Shawna MR#:  [id number] DATE OF BIRTH:  1965-05-25  DATE OF CONSULTATION:  10/27/2011  REFERRING PHYSICIAN:  Dr. Katharina Caperima Va[place]ute  CONSULTING PHYSICIAN:  Raequon Catanzaro B. Danee Soller, MD  REASON FOR CONSULTATION: To evaluate a patient after a suicide attempt.   IDENTIFYING DATA: Shawna [name] is a 49 year old female with history of depression and anxiety.   CHIEF COMPLAINT: "I have a lot of problems."   HISTORY OF PRESENT ILLNESS: Shawna [name] reports that she has been treated for depression since before her youngest daughter who is now 48 years old was born. She has been maintained lately on a combination of Celexa, Xanax and Ambien prescribed by Dr. Janeece RiggersSu. She feels that she has been stable on her medication. She overdosed on all of her medications in the context of multiple stressors. She had been separated from her husband for five years now, started dating someone, wanted to move in with this man but he turned out to be an alcoholic so she had to stay at her current apartment living with her daughter. She had an argument with the daughter on the night of admission. Apparently her daughter [place] to [place] with her husband and the patient is unwilling to do so. She has very limited resources, less than $600 of disability check a month. The only way she is not homeless is to split the rent with her daughter. The patient adamantly refuses to move away from [facility]. Apparently this created problem big enough that the patient overdosed. There is also conflict with her oldest son. The patient does not like his girlfriend and did not participate in two consecutive birthday parties for her grandson. She denies psychotic symptoms or symptoms suggestive of bipolar mania. She does not use alcohol, illicit drugs. She does not abuse prescription pills.  PAST PSYCHIATRIC HISTORY: This is her second hospitalization. She has one prior suicide attempt by overdose. She is rather vague about the  circumstances. There is no history of substance abuse. [facility] The patient reports severe anxiety in addition to depression with agoraphobia and panic attacks, however, on the current regimen of medications she has no more than three or four anxiety attacks a month and has been able to function adequately.    FAMILY PSYCHIATRIC HISTORY: Mother with depression and anxiety.    PAST MEDICAL HISTORY:  1. Dyslipidemia.  2. Osteoarthritis.  3. Stomach ulcer.  4. Gastroesophageal reflux disease.   ALLERGIES: Morphine and Vicodin.   MEDICATIONS ON ADMISSION:  1. Xanax 1 mg 3 times daily plus 1 at bedtime.  2. Ambien 10 mg at bedtime. 3. Celexa 20 mg daily.  4. Omeprazole 10 mg daily.  5. Zofran as needed.   SOCIAL HISTORY: She is divorced. She is on disability since 2004 for mental illness and sciatica. She has very little money each month. She pays half the rent and lives with her daughter and her husband. 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 She does not do well in crowds or as a Technical sales engineerteam worker, she prefers to do her own projects.   REVIEW OF SYSTEMS: CONSTITUTIONAL: No fevers or chills. No weight changes. EYES: No double or blurred vision. ENT: No hearing loss. RESPIRATORY: No shortness of breath or cough. CARDIOVASCULAR: No chest pain or orthopnea. GASTROINTESTINAL: No abdominal pain, nausea, vomiting, or diarrhea. GENITOURINARY: No incontinence or frequency. ENDOCRINE: No heat or cold intolerance. LYMPHATIC: No anemia or easy bruising.  INTEGUMENTARY: No acne or rash. MUSCULOSKELETAL: No muscle or joint pain. NEUROLOGIC: No tingling or weakness. PSYCHIATRIC: See history of present illness for details.   PHYSICAL EXAMINATION:  VITAL SIGNS: Blood pressure 105/53, pulse 56, respirations 17, temperature 98.   GENERAL: This is a slender female in no acute distress. The rest of the physical examination is deferred to her primary  attending.   LABORATORY, DIAGNOSTIC AND RADIOLOGICAL DATA: Chemistries are within normal limits except for sodium of 147. LFTs within normal limits except for AST of 55. Elevated cardiac enzymes with CK 1516, CPK-MB 22.9 and troponin 0.06. TSH 0.97. Urine tox screen positive for benzodiazepines, cannabinoid and PCP. CBC within normal limits. Urinalysis is not suggestive of urinary tract infection. Pregnancy urine test is negative. EKG: Sinus bradycardia, ST and T wave abnormality, prolonged QT.   MENTAL STATUS EXAMINATION: I tried to interview the patient last night, she was too sedated. She is alert and oriented to person, place, time, and situation. She is pleasant, polite, and cooperative. She is in bed wearing hospital gown. She maintains good eye contact. Her speech is soft. Mood is depressed with flat affect. Thought processing is logical and goal oriented. Thought content: She denies suicidal ideation but is clearly unhappy that she survived the suicide attempt. She denies intention or a plan and is able to contract for safety in the hospital. There are no thoughts of hurting others. There are no delusions or paranoia. There are no auditory or visual hallucinations. Her cognition is grossly intact. She registers three out of three and recalls three out of three after five minutes. She can spell world forward and backward. She can name current president. Her insight is fair. Her judgment is questionable.   SUICIDE RISK ASSESSMENT: This is a patient with long history of depression and anxiety who overdosed on medication in a suicide attempt, left suicidal note in the face of her multiple social stressors. She is still at risk of suicide.   ASSESSMENT:  AXIS I:  1. Major depressive disorder, recurrent, severe.  2. Anxiety disorder, not otherwise specified.   AXIS II: Deferred.   AXIS III:  1. Status post overdose. 2. Gastroesophageal reflux disease. 3. Hypertension.   AXIS IV: Mental illness,  financial, housing, family conflict, relationship.   AXIS V: GAF 25.       PLAN: Please continue sitter. The patient will be transferred to [facility] Medicine unit when medically stable. Please contact admission nurse at [phone] to coordinate transfer.   ____________________________ Ellin Goodie. Jennet Maduro, MD jbp:cms D: 10/27/2011 14:35:59 ET T: 10/27/2011 15:14:23 ET JOB#: [id number]  cc: Ashton Belote B. Jennet Maduro, MD, <Dictator>  Shari Prows MD ELECTRONICALLY SIGNED 10/28/2011 19:58

## 2014-08-19 NOTE — Consult Note (Signed)
Brief Consult Note: Diagnosis: mood disorder NOS, S/P intentional overdose.   Patient was seen by consultant.   Consult note dictated.   Recommend further assessment or treatment.   Orders entered.   Discussed with Attending MD.   Comments: [name] OD on medications and left a suicide note. She feels better today but is very tearful. She also complains of a new onset stutter. [facility]   PLAN: 1.[facility]. Please contact admission nurse at [phone] to coordinate transfer.  Electronic Signatures: Kristine LineaPucilowska, Jolanta (MD)  (Signed 04-Jul-13 11:58)  Authored: Brief Consult Note   Last Updated: 04-Jul-13 11:58 by Kristine LineaPucilowska, Jolanta (MD)

## 2014-08-19 NOTE — Consult Note (Signed)
Brief Consult Note: Diagnosis: mood disorder NOS, S/P intentional overdose.   Patient was seen by consultant.   Consult note dictated.   Recommend further assessment or treatment.   Discussed with Attending MD.   Comments: [name] OD on medications and left a suicide note. She is still suicidal w/o intention or a plan. She is able to CFS.   PLAN: 1. Please continue sitter.  2. [facility] Please contact admission nurse at [phone] to coordinate transfer.  Electronic Signatures: Kristine LineaPucilowska, Jacyln Carmer (MD)  (Signed 02-Jul-13 14:36)  Authored: Brief Consult Note   Last Updated: 02-Jul-13 14:36 by Kristine LineaPucilowska, Toryn Mcclinton (MD)

## 2014-08-19 NOTE — Consult Note (Signed)
Brief Consult Note: Diagnosis: mood disorder NOS, S/P intentional overdose.   Patient was seen by consultant.   Consult note dictated.   Recommend further assessment or treatment.   Orders entered.   Discussed with Attending MD.   Comments: [name] OD on medications and left a suicide note. She is no longer suicidal. She is able to CFS. [facility]   PLAN: 1. I will d/c sitter.   2. [facility] Please contact admission nurse at [phone] to coordinate transfer.  Electronic Signatures: Kristine LineaPucilowska, Fran Neiswonger (MD)  (Signed 03-Jul-13 12:41)  Authored: Brief Consult Note   Last Updated: 03-Jul-13 12:41 by Kristine LineaPucilowska, Cassadee Vanzandt (MD)

## 2014-08-19 NOTE — Discharge Summary (Signed)
PATIENT NAMRandall [name]:  Bradburn, Shawna [name] MR#:  [id number] DATE OF BIRTH:  Jul 13, 1965  DATE OF ADMISSION:  10/26/2011 DATE OF DISCHARGE:  10/31/2011  PRIMARY CARE PHYSICIAN: [facility]   DISPOSITION: [facility].  DISCHARGE DIAGNOSES: 1. Medical overdose on Xanax, Remeron, and Celexa.  2. Major depressive disorder.  3. Anxiety disorder. 4. Rhabdomyolysis. 5. Elevated troponin secondary to rhabdomyolysis.  6. Acute encephalopathy secondary to medication overdose. 7. Tobacco abuse. 8. Hypertriglyceridemia.  9. Hyperglycemia. 10. Mild anemia, stable.  11. Suicide attempt.  CONSULTANTS: Kristine LineaJolanta Pucilowska, MD - Psychiatry.  IMAGING STUDIES: Chest x-ray showed no acute cardiopulmonary disease.  CT scan of the head showed no acute intracranial abnormality, but showed some chronic maxillary sinus inflammation.  CT scan of the cervical spine showed no acute fractures or masses.   ADMITTING HISTORY AND PHYSICAL: Please see the detailed History and Physical dictated on 10/26/2011.    In brief, this is a 49 year old Caucasian female patient with past history of TIA and depression who was brought in by the family with complaint of unresponsiveness.  The patient had empty bottles of Remeron, Celexa, and Xanax beside her and was admitted to the hospitalist service with diagnosis of polydrug overdose.    HOSPITAL COURSE:  1. Polydrug overdose.  The patient with overdose of unknown number of pills of Celexa, Remeron, and Xanax was admitted to the ICU for close monitoring on telemetry. The patient was initially hypotensive and bradycardic, but quickly recovered as the medications wore off with IV fluids.  Also her encephalopathy secondary to the medications had resolved and the patient was moved to the telemetry floor.   2. Suicide attempt. The patient had an IVC placed initially with a one-to-one sitter.  Psychiatry was consulted who suggested a sitter be continued initially. Later the  IVC was discontinued and discharged and then later admission to the Behavior Medical unit was advised.  The patient's medications for depression and anxiety have been discontinued at this time and will be placed back on later by psychiatry.  The patient seems to be improving at this time. She does not have any suicidal ideation on the day of discharge. 3. Mild rhabdomyolysis. The patient secondary to the drug overdose had some mild rhabdomyolysis with CK elevated at 1200 which also caused some mild elevation in the troponin with maximum level being 0.06 which trended down and the patient did not have any chest pain or shortness of breath.  The patient's CK is slowly trending down and on the day of discharge is at 325, close to normal. The patient does not have any myalgias or muscle tenderness.   4. The patient did have transient hyperglycemia and hypertension secondary to IV fluids and stress, which has resolved.   DISCHARGE MEDICATIONS: 1. Nicotine 21 mg patch topical once a day. 2. Aspirin 81 mg oral once a day. 3. Ibuprofen 200 mg oral tablet one tablet oral every 12 hours as needed for pain. 4. Citalopram 40 mg oral tablet once daily. 5. Xanax one tablet oral three times daily and at bedtime as needed. 6. Ambien 10 mg oral once a day as needed. 7. Omeprazole 20 mg oral once a day. 8. Zofran 4 mg oral three times daily as needed for nausea and vomiting.  DISCHARGE INSTRUCTIONS: The patient will be on a low fat, low cholesterol diet. Activity as tolerated.  Further management as per psychiatry, in the behavioral unit. She will followup with her primary care physician within a week after discharge  from the behavioral medical unit and will need repeat fasting lipid profile in 6 weeks.  This plan was discussed with the patient who is in agreement with the plan.   TIME SPENT: Time spent on discharging patient along with coordinating care, counseling of the patient, and this discharge dictation was 35  minutes.  ____________________________ Molinda Bailiff Shealyn Sean, MD srs:slb D: 10/31/2011 13:08:00 ET T: 11/02/2011 12:06:41 ET JOB#: [id number]  cc: Wardell Heath R. Rakesh Dutko, MD, <Dictator> [facility] [place] MD ELECTRONICALLY SIGNED 11/06/2011 14:12

## 2014-08-19 NOTE — H&P (Signed)
PATIENT NAMENATEISHA, [name] MR#:  [id number] DATE OF BIRTH:  08-04-1965  DATE OF ADMISSION:  10/26/2011  PRIMARY CARE PHYSICIAN: [facility]   HISTORY OF PRESENT ILLNESS: The patient is a 49 year old Caucasian female with past medical history significant for history of admission in July 2011 for TIA who presented to the hospital with complaints of unresponsiveness. Apparently the patient was found by her family, the patient's daughter, at home earlier today. It is unclear further details of her discovery unavailable but, according to medical records, the patient apparently was found lethargic as well as poorly arousable just to verbal stimuli. She had bottles of Celexa as well as Remeron empty. Her bottle of Xanax was also empty. She also left a suicide note. She was seen last at her baseline at around 22:30 last night. This morning she was noted to be unresponsive and the patient's daughter who found her called EMS. On arrival to the Emergency Room, the patient was hypotensive with systolic blood pressure around 90's as well as somewhat bradycardic with heart rate of 50's to high 40's. Oxygen saturation remained stable at around 95% on room air. She was poorly awake, not able to communicate, and hospitalist services were contacted for admission.   PAST MEDICAL HISTORY:  1. History of admission in July 2011 for what sounded like TIA with left upper extremity weakness of unclear etiology, could not rule out transient ischemic attack. At that time she also was diagnosed with infection, questionably left lower lobe pneumonia.  2. Tobacco abuse. 3. Anxiety/depression. 4. Hyperlipidemia. 5. History of osteoarthritis.   MEDICATIONS: According to medical records, the patient was on: 1. Xanax 1 mg 3 times daily as well as at bedtime, last prescribed and filled by pharmacy on 10/09/2011.  2. Ambien 10 mg p.o. at bedtime, last filled by pharmacist on 10/15/2011.  3. Citalopram 40 mg p.o. half to 1 tablet  daily, last filled on 09/30/2011.  4. Omeprazole 10 mg p.o. daily, last filled on 10/15/2011.  5. Zofran 4 mg 3 times daily. It is unclear if she is still using this medication. 6. In the past she was also on Mobic, Percocet, thiamine, nicotine patch, and Pravachol as well as aspirin. It is unclear if she is still using those medications at all.   ALLERGIES: Morphine as well as Vicodin.   SOCIAL HISTORY: She used to smoke about half pack a day as of July 2011. Has been smoking for the past 30 years. No history of alcohol abuse. No illicit drug abuse. Lives at home with her husband. Apparently has a daughter, however, no family members are available here in the hospital.   FAMILY HISTORY: According to medical records, the patient's mother has diabetes as well as hypertension as well as coronary disease. The patient's father died of unknown reasons at very young age.   REVIEW OF SYSTEMS: Not available as the patient is poorly responsive. She admits of having no significant pains, however, not able to provide much more input.   PHYSICAL EXAMINATION:   VITAL SIGNS: On arrival to the hospital, temperature was not measured, pulse 48 to 60's, respiration rate 9, blood pressure 90's to 103/60's, oxygen saturation 95% on room air.   GENERAL: This is a well developed, well nourished thin pale Caucasian female in no significant distress laying on the stretcher.   HEENT: Pupils are 1 mm, equal and reactive to light. Extraocular muscles intact. No icterus or conjunctivitis. Has normal hearing. No pharyngeal erythema. Mucosa is very dry.  NECK: Neck did not reveal any masses. It was supple, nontender. Thyroid not enlarged. No adenopathy. No JVD or carotid bruits bilaterally. Full range of motion.   LUNGS: Clear to auscultation. Few rhonchi were heard bilaterally especially on inspiration. Somewhat diminished breath sounds bilaterally but no wheezing. No labored respirations, increased effort. No dullness  to percussion. Not in overt respiratory distress.   CARDIOVASCULAR: S1, S2 appreciated. No murmurs, gallops, or rubs were noted. PMI not lateralized. Chest is nontender to palpation. 1+ pedal pulses. No lower extremity edema, calf tenderness, or cyanosis.   ABDOMEN: Soft, nontender. Bowel sounds are present. No hepatosplenomegaly or masses.   RECTAL: Deferred.   MUSCULOSKELETAL: Muscle strength not able to move her lower extremities. She is able to move upper extremities, however, not able to examine her lower extremity movement due to poor cooperation as well as significant somnolence. No cyanosis, degenerative joint disease. Not able to assess her for kyphosis. Gait is not tested.   SKIN: Skin revealed multiple lesions, bruises. No erythema, nodularity, or induration. It was warm and dry to palpation.   LYMPH: No adenopathy in the cervical region.   NEUROLOGIC: Cranial nerves grossly intact. Sensory is difficult to evaluate, however, grossly intact.  No Babinski. The patient is dysarthric. No aphasia.   PSYCH: The patient is somnolent, poorly cooperative, not able to assess memory. She has no confusion or agitation. She does have somewhat altered mental status. Not able to respond to questions or stimuli appropriately because of decreased level of consciousness.   LABORATORY, DIAGNOSTIC, AND RADIOLOGICAL DATA: BMP within normal limits except of low chloride of 110, otherwise BMP was unremarkable. Alcohol level less than 0.003%. Liver enzymes showed AST slightly elevated to 44. CK total is elevated to 1215. MB fraction was elevated to 24.1. Troponin is less than 0.02. Urine drug screen was positive for benzodiazepines, cannabinoids, as well as phencyclidine. CBC white blood cell count 9.2, hemoglobin 12.6, platelet count 295, MCV high at 101. Coagulation panel within normal limits. Urinalysis straw clear urine, negative for glucose, bilirubin, or ketones. Specific gravity 1.006, pH 6.0, 1+ blood,  negative for protein, nitrites, or leukocyte esterase, 1 red blood cell, 2 white blood cells, no bacteria, 1 epithelial cell. Mucus was present. Pregnancy test in urine was negative. ABGs were done on room air and pH showed 7.35, pCO2 44, pO2 78, saturation 95.2% on room air. Lactic acid level of 0.5.   EKG sinus brady at 56 beats per minute, ST-T wave abnormality, consider inferior ischemia according to EKG criteria. Prolonged QTc to 494 ms. No acute ST-T changes were noted.   ASSESSMENT AND PLAN:  1. Overdose with medications, likely citalopram as well as possibly Remeron as well as benzodiazepines, cannabinoids, and phencyclidine which is probably due to citalopram. Admit patient to medical floor Intensive Care Unit. Continue patient on IV fluids. Supportive therapy. Will continue neuro checks. Will follow the patient and make decisions about therapy.  2. Metabolic encephalopathy. Continue neuro checks.  3. Rhabdomyolysis. Continue IV fluids.  4. Abnormal EKG. Check cardiac enzymes x3.  5. Hypertension. Will continue IV fluids.  6. Suicide attempt. Will get Psychiatry consultation. Involuntary commitment already prepared for the patient in the Emergency Room.  7. Elevated transaminases likely due to elevated CK total level.  8. History of tobacco abuse. Nicotine replacement therapy will be initiated. I'm not able to discuss with the patient, unfortunately, due to her altered mental status.   TIME SPENT: 50 minutes.   ____________________________ Katharina Caper, MD  rv:drc D: 10/26/2011 14:06:55 ET T: 10/26/2011 15:15:33 ET JOB#: [id number]  cc: Katharina Caperima Stephano Arrants, MD, <Dictator> [facility] Tadashi Burkel MD ELECTRONICALLY SIGNED 11/15/2011 18:35

## 2014-08-19 NOTE — Consult Note (Signed)
Brief Consult Note: Diagnosis: mood disorder NOS, S/P intentional overdose.   Patient was seen by consultant.   Consult note dictated.   Recommend further assessment or treatment.   Discussed with Attending MD.   Comments: Ms. Burnetta Sabinvey OD on medications and left a suicide note. She feels better today but is very tearful. She also complains of a new onset stutter. [facility]   MSE: Alert, oriented, tearful, denies SI/HI and is able to CFS. Still interested in BMU admission.  PLAN: 1.[facility] Please contact admission nurse at [phone] to coordinate transfer.  2. She needs admission to start treatment od depression and assure proper f/u.  Electronic Signatures: Kristine LineaPucilowska, Jolanta (MD)  (Signed 05-Jul-13 23:28)  Authored: Brief Consult Note   Last Updated: 05-Jul-13 23:28 by Kristine LineaPucilowska, Jolanta (MD)

## 2014-09-16 ENCOUNTER — Encounter: Payer: Self-pay | Admitting: Emergency Medicine

## 2014-09-16 ENCOUNTER — Emergency Department
Admission: EM | Admit: 2014-09-16 | Discharge: 2014-09-16 | Disposition: A | Discharge status: Home / Self Care | Payer: Medicaid Other | Attending: Emergency Medicine | Admitting: Emergency Medicine

## 2014-09-16 DIAGNOSIS — M25561 Pain in right knee: Secondary | ICD-10-CM | POA: Insufficient documentation

## 2014-09-16 DIAGNOSIS — M25552 Pain in left hip: Secondary | ICD-10-CM | POA: Insufficient documentation

## 2014-09-16 DIAGNOSIS — M25571 Pain in right ankle and joints of right foot: Secondary | ICD-10-CM | POA: Insufficient documentation

## 2014-09-16 DIAGNOSIS — M25562 Pain in left knee: Secondary | ICD-10-CM | POA: Diagnosis not present

## 2014-09-16 DIAGNOSIS — Z720 Tobacco use: Secondary | ICD-10-CM | POA: Diagnosis not present

## 2014-09-16 DIAGNOSIS — M25551 Pain in right hip: Secondary | ICD-10-CM | POA: Diagnosis present

## 2014-09-16 DIAGNOSIS — M25572 Pain in left ankle and joints of left foot: Secondary | ICD-10-CM | POA: Diagnosis not present

## 2014-09-16 HISTORY — DX: Insomnia, unspecified: G47.00

## 2014-09-16 HISTORY — DX: Other intervertebral disc displacement, lumbar region: M51.26

## 2014-09-16 HISTORY — DX: Sciatica, right side: M54.31

## 2014-09-16 HISTORY — DX: Other intervertebral disc degeneration, lumbar region: M51.36

## 2014-09-16 HISTORY — DX: Other intervertebral disc degeneration, lumbar region without mention of lumbar back pain or lower extremity pain: M51.369

## 2014-09-16 MED ORDER — TRAMADOL HCL 50 MG PO TABS: 100.0000 mg | ORAL_TABLET | Freq: Four times a day (QID) | ORAL | Status: DC | PRN

## 2014-09-16 MED ORDER — KETOROLAC TROMETHAMINE 30 MG/ML IJ SOLN
60.0000 mg | Freq: Once | INTRAMUSCULAR | Status: AC
  Administered 2014-09-16: 60 mg via INTRAMUSCULAR

## 2014-09-16 MED ORDER — PROMETHAZINE HCL 25 MG PO TABS
ORAL_TABLET | ORAL | Status: AC
  Filled 2014-09-16: qty 1

## 2014-09-16 MED ORDER — PROMETHAZINE HCL 25 MG PO TABS
25.0000 mg | ORAL_TABLET | Freq: Once | ORAL | Status: AC
  Administered 2014-09-16: 25 mg via ORAL

## 2014-09-16 MED ORDER — HYDROCODONE-ACETAMINOPHEN 5-325 MG PO TABS
2.0000 | ORAL_TABLET | Freq: Once | ORAL | Status: AC
  Administered 2014-09-16: 2 via ORAL

## 2014-09-16 MED ORDER — PROMETHAZINE HCL 25 MG/ML IJ SOLN: 25.0000 mg | Freq: Once | INTRAMUSCULAR | Status: DC

## 2014-09-16 MED ORDER — HYDROCODONE-ACETAMINOPHEN 5-325 MG PO TABS
ORAL_TABLET | ORAL | Status: AC
  Filled 2014-09-16: qty 2

## 2014-09-16 MED ORDER — KETOROLAC TROMETHAMINE 30 MG/ML IJ SOLN
INTRAMUSCULAR | Status: AC
  Filled 2014-09-16: qty 1

## 2014-09-16 MED ORDER — KETOROLAC TROMETHAMINE 60 MG/2ML IM SOLN
INTRAMUSCULAR | Status: AC
  Filled 2014-09-16: qty 2
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
## 2014-09-16 NOTE — Discharge Instructions (Signed)
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
## 2014-12-19 ENCOUNTER — Emergency Department: Payer: Medicaid Other

## 2014-12-19 ENCOUNTER — Encounter: Payer: Self-pay | Admitting: Emergency Medicine

## 2014-12-19 ENCOUNTER — Emergency Department
Admission: EM | Admit: 2014-12-19 | Discharge: 2014-12-19 | Disposition: A | Discharge status: Home / Self Care | Payer: Medicaid Other | Attending: Emergency Medicine | Admitting: Emergency Medicine

## 2014-12-19 DIAGNOSIS — Z96611 Presence of right artificial shoulder joint: Secondary | ICD-10-CM | POA: Diagnosis not present

## 2014-12-19 DIAGNOSIS — M25511 Pain in right shoulder: Secondary | ICD-10-CM | POA: Diagnosis not present

## 2014-12-19 DIAGNOSIS — Z720 Tobacco use: Secondary | ICD-10-CM | POA: Insufficient documentation

## 2014-12-19 LAB — CBC WITH DIFFERENTIAL/PLATELET
Basophils Absolute: 0.1 10*3/uL (ref 0–0.1)
Basophils Relative: 1 %
EOS PCT: 2 %
Eosinophils Absolute: 0.2 10*3/uL (ref 0–0.7)
HEMATOCRIT: 40.3 % (ref 35.0–47.0)
Hemoglobin: 13.5 g/dL (ref 12.0–16.0)
LYMPHS ABS: 2.3 10*3/uL (ref 1.0–3.6)
LYMPHS PCT: 22 %
MCH: 28.5 pg (ref 26.0–34.0)
MCHC: 33.4 g/dL (ref 32.0–36.0)
MCV: 85.2 fL (ref 80.0–100.0)
MONO ABS: 0.5 10*3/uL (ref 0.2–0.9)
MONOS PCT: 5 %
NEUTROS ABS: 7.3 10*3/uL — AB (ref 1.4–6.5)
Neutrophils Relative %: 70 %
PLATELETS: 386 10*3/uL (ref 150–440)
RBC: 4.74 MIL/uL (ref 3.80–5.20)
RDW: 15.5 % — AB (ref 11.5–14.5)
WBC: 10.4 10*3/uL (ref 3.6–11.0)

## 2014-12-19 LAB — BASIC METABOLIC PANEL
Anion gap: 10 (ref 5–15)
BUN: 8 mg/dL (ref 6–20)
CALCIUM: 9.4 mg/dL (ref 8.9–10.3)
CO2: 22 mmol/L (ref 22–32)
Chloride: 108 mmol/L (ref 101–111)
Creatinine, Ser: 0.78 mg/dL (ref 0.44–1.00)
GFR calc Af Amer: >60 mL/min (ref >60)
Glucose, Bld: 114 mg/dL — ABNORMAL HIGH (ref 65–99)
Potassium: 3.9 mmol/L (ref 3.5–5.1)
Sodium: 140 mmol/L (ref 135–145)

## 2014-12-19 MED ORDER — ONDANSETRON HCL 4 MG/2ML IJ SOLN
4.0000 mg | Freq: Once | INTRAMUSCULAR | Status: AC
  Administered 2014-12-19: 4 mg via INTRAVENOUS
  Filled 2014-12-19: qty 2

## 2014-12-19 MED ORDER — HYDROMORPHONE HCL 1 MG/ML IJ SOLN
1.0000 mg | Freq: Once | INTRAMUSCULAR | Status: AC
  Administered 2014-12-19: 1 mg via INTRAVENOUS
  Filled 2014-12-19: qty 1

## 2014-12-19 NOTE — ED Provider Notes (Signed)
[facility] Emergency Department Provider Note  ____________________________________________  Time seen: Approximately 12:27 PM  I have reviewed the triage vital signs and the nursing notes.   HISTORY  Chief Complaint Shoulder Pain    HPI Shawna [name] is a 49 y.o. female patient complaining of right shoulder pain for a couple days. Patient states the pain is increased in the last 24 hours. Patient had a shoulder replacement June of this year. Patient states she felt like something has slipped in her shoulder the movement is abnormal. Patient states she called her surgeon and was told to go to the nearest ER to have a CT scan of the shoulder. Patient arrived via EMS. Patient is taking Percocet and Dilaudid which she states is not helping her pain. Patient rates the pain as a 10 over 10. Patient also states she is in the pain management at [facility]. Patient also state the surgeons at [facility] of patient's narcotic log show that she had a prescription for 90 Percocets on a August 4 prescription from different provider on 12/06/2014 for 90 Percocets.   Past Medical History  Diagnosis Date  . Sciatica of right side   . Bulging lumbar disc   . Insomnia     There are no active problems to display for this patient.   Past Surgical History  Procedure Laterality Date  . Tubal ligation    . Abdominal hysterectomy      Partial  . Tonsillectomy    . Shoulder surgery Right     Current Outpatient Rx  Name  Route  Sig  Dispense  Refill  . HYDROcodone-acetaminophen (NORCO/VICODIN) 5-325 MG per tablet   Oral   Take 1 tablet by mouth every 6 (six) hours as needed for moderate pain.         . traMADol (ULTRAM) 50 MG tablet   Oral   Take 2 tablets (100 mg total) by mouth every 6 (six) hours as needed for moderate pain.   30 tablet   0     Allergies Morphine and related  No family history on file.  Social History Social History  Substance Use  Topics  . Smoking status: Current Every Day Smoker -- 1.00 packs/day for 40 years  . Smokeless tobacco: Never Used  . Alcohol Use: No    Review of Systems Constitutional: No fever/chills Eyes: No visual changes. ENT: No sore throat. Cardiovascular: Denies chest pain. Respiratory: Denies shortness of breath. Gastrointestinal: No abdominal pain.  No nausea, no vomiting.  No diarrhea.  No constipation. Genitourinary: Negative for dysuria. Musculoskeletal: Right shoulder pain Skin: Negative for rash. Neurological: Negative for headaches, focal weakness or numbness. 10-point ROS otherwise negative.  ____________________________________________   PHYSICAL EXAM:  VITAL SIGNS: ED Triage Vitals  Enc Vitals Group     BP 12/19/14 1149 118/86 mmHg     Pulse Rate 12/19/14 1149 105     Resp 12/19/14 1149 18     Temp 12/19/14 1149 97.8 F (36.6 C)     Temp Source 12/19/14 1149 Oral     SpO2 12/19/14 1149 98 %     Weight 12/19/14 1149 157 lb (71.215 kg)     Height 12/19/14 1149  (1.651 m)     Head Cir --      Peak Flow --      Pain Score 12/19/14 1149 10     Pain Loc --      Pain Edu? --  Excl. in GC? --     Constitutional: Alert and oriented. Appears acute distress Eyes: Conjunctivae are normal. PERRL. EOMI. Head: Atraumatic. Nose: No congestion/rhinnorhea. Mouth/Throat: Mucous membranes are moist.  Oropharynx non-erythematous. Neck: No stridor.  No cervical spine tenderness to palpation. Hematological/Lymphatic/Immunilogical: No cervical lymphadenopathy. Cardiovascular: Normal rate, regular rhythm. Grossly normal heart sounds.  Good peripheral circulation. Respiratory: Normal respiratory effort.  No retractions. Lungs CTAB. Gastrointestinal: Soft and nontender. No distention. No abdominal bruits. No CVA tenderness. Musculoskeletal: No obvious deformity patient hold the extremity in abduction. Surgical scar consistent with history. Patient refuses any range of  motion. Neurologic:  Normal speech and language. No gross focal neurologic deficits are appreciated. No gait instability. Skin:  Skin is warm, dry and intact. No rash noted. Psychiatric: Mood and affect are normal. Speech and behavior are normal.  ____________________________________________   LABS (all labs ordered are listed, but only abnormal results are displayed)  Labs Reviewed  CBC WITH DIFFERENTIAL/PLATELET - Abnormal; Notable for the following:    RDW 15.5 (*)    Neutro Abs 7.3 (*)    All other components within normal limits  BASIC METABOLIC PANEL - Abnormal; Notable for the following:    Glucose, Bld 114 (*)    All other components within normal limits   ____________________________________________  EKG   ____________________________________________  RADIOLOGY  X-ray and CT scan of the right shoulder show no acute findings. ____________________________________________   PROCEDURES  Procedure(s) performed: None  Critical Care performed: No  ____________________________________________   INITIAL IMPRESSION / ASSESSMENT AND PLAN / ED COURSE  Pertinent labs & imaging results that were available during my care of the patient were reviewed by me and considered in my medical decision making (see chart for details).  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 Discussed negative findings of CT and x-ray of the shoulder. Advised patient to follow-up with treatment surgeon. Advised patient no  stronger narcotic medication will be written. Patient given a consult pain management. ____________________________________________   FINAL CLINICAL IMPRESSION(S) / ED DIAGNOSES  Final diagnoses:  Acute shoulder pain, right      Joni Reining, PA-C 12/19/14 1518  Governor Rooks, MD 12/20/14 0830

## 2014-12-19 NOTE — ED Notes (Signed)
Patient transported back from X-ray 

## 2014-12-19 NOTE — ED Notes (Signed)
Patient transported to X-ray 

## 2014-12-19 NOTE — Discharge Instructions (Signed)

## 2014-12-19 NOTE — ED Notes (Signed)
Inform PA and this nurse that she was told to comet o nearest ED for poss ct scan of shoulder. Thinks that something may have slipped in shoulder

## 2014-12-19 NOTE — ED Notes (Signed)
States she had shoulder surgery in June.  Tried to sleep in her bed couple of days ago  Now having increased pain

## 2014-12-21 ENCOUNTER — Emergency Department: Payer: Medicaid Other

## 2014-12-21 ENCOUNTER — Emergency Department
Admission: EM | Admit: 2014-12-21 | Discharge: 2014-12-21 | Disposition: A | Discharge status: Home / Self Care | Payer: Medicaid Other | Attending: Emergency Medicine | Admitting: Emergency Medicine

## 2014-12-21 DIAGNOSIS — W108XXA Fall (on) (from) other stairs and steps, initial encounter: Secondary | ICD-10-CM | POA: Diagnosis not present

## 2014-12-21 DIAGNOSIS — W19XXXA Unspecified fall, initial encounter: Secondary | ICD-10-CM

## 2014-12-21 DIAGNOSIS — S299XXA Unspecified injury of thorax, initial encounter: Secondary | ICD-10-CM | POA: Insufficient documentation

## 2014-12-21 DIAGNOSIS — Y9289 Other specified places as the place of occurrence of the external cause: Secondary | ICD-10-CM | POA: Diagnosis not present

## 2014-12-21 DIAGNOSIS — Y9389 Activity, other specified: Secondary | ICD-10-CM | POA: Insufficient documentation

## 2014-12-21 DIAGNOSIS — Z720 Tobacco use: Secondary | ICD-10-CM | POA: Diagnosis not present

## 2014-12-21 DIAGNOSIS — M546 Pain in thoracic spine: Secondary | ICD-10-CM

## 2014-12-21 DIAGNOSIS — Z79899 Other long term (current) drug therapy: Secondary | ICD-10-CM | POA: Diagnosis not present

## 2014-12-21 DIAGNOSIS — Y998 Other external cause status: Secondary | ICD-10-CM | POA: Insufficient documentation

## 2014-12-21 HISTORY — DX: Weakness: R53.1

## 2014-12-21 HISTORY — DX: Sciatica, unspecified side: M54.30

## 2014-12-21 MED ORDER — ONDANSETRON HCL 4 MG/2ML IJ SOLN
4.0000 mg | Freq: Once | INTRAMUSCULAR | Status: AC
  Administered 2014-12-21: 4 mg via INTRAVENOUS
  Filled 2014-12-21: qty 2

## 2014-12-21 MED ORDER — HYDROMORPHONE HCL 1 MG/ML IJ SOLN
1.0000 mg | Freq: Once | INTRAMUSCULAR | Status: AC
  Administered 2014-12-21: 1 mg via INTRAVENOUS
  Filled 2014-12-21: qty 1

## 2014-12-21 NOTE — ED Notes (Signed)
Blood sent to lab

## 2014-12-21 NOTE — ED Notes (Signed)
Pt assisted on bedpan.

## 2014-12-21 NOTE — ED Notes (Signed)
Discharge instructions reviewed, all questions answered

## 2014-12-21 NOTE — ED Provider Notes (Signed)
[facility] Emergency Department Provider Note  ____________________________________________  Time seen: Approximately 2:59 AM  I have reviewed the triage vital signs and the nursing notes.   HISTORY  Chief Complaint Fall    HPI Shawna [name] is a 49 y.o. female who presents to the ED from home via EMS s/p fall down 6 steps. Patient took an Ambien to sleep, took some blankets upstairs, slipped and fell. Denies striking head or loss of consciousness. History of chronic back pain, recent right shoulder surgery 2 months ago.Complains of 9/10 thoracic back pain. Denies associated numbness, tingling, bowel or bladder incontinence, or leg weakness.   Past Medical History  Diagnosis Date  . Sciatica of right side   . Bulging lumbar disc   . Insomnia   . Sciatic leg pain     Right side  . Weakness     Bil knees, and ankles  . Bulging lumbar disc     There are no active problems to display for this patient.   Past Surgical History  Procedure Laterality Date  . Tubal ligation    . Abdominal hysterectomy      Partial  . Tonsillectomy    . Shoulder surgery Right     Current Outpatient Rx  Name  Route  Sig  Dispense  Refill  . hydrOXYzine (ATARAX/VISTARIL) 25 MG tablet   Oral   Take 25 mg by mouth at bedtime as needed.         Marland Kitchen omeprazole (PRILOSEC) 40 MG capsule   Oral   Take 40 mg by mouth daily. QHS         . zolpidem (AMBIEN) 10 MG tablet   Oral   Take 10 mg by mouth at bedtime.         Marland Kitchen HYDROcodone-acetaminophen (NORCO/VICODIN) 5-325 MG per tablet   Oral   Take 1 tablet by mouth every 6 (six) hours as needed for moderate pain.         . traMADol (ULTRAM) 50 MG tablet   Oral   Take 2 tablets (100 mg total) by mouth every 6 (six) hours as needed for moderate pain.   30 tablet   0     Allergies Morphine and related  Family History  Problem Relation Age of Onset  . Fibromyalgia Mother     Social History Social History   Substance Use Topics  . Smoking status: Current Every Day Smoker -- 1.00 packs/day for 40 years    Types: Cigarettes  . Smokeless tobacco: Never Used  . Alcohol Use: No    Review of Systems Constitutional: No fever/chills Eyes: No visual changes. ENT: No sore throat. Cardiovascular: Denies chest pain. Respiratory: Denies shortness of breath. Gastrointestinal: No abdominal pain.  No nausea, no vomiting.  No diarrhea.  No constipation. Genitourinary: Negative for dysuria. Musculoskeletal: Positive for back pain. Skin: Negative for rash. Neurological: Negative for headaches, focal weakness or numbness.  10-point ROS otherwise negative.  ____________________________________________   PHYSICAL EXAM:  VITAL SIGNS: ED Triage Vitals  Enc Vitals Group     BP 12/21/14 0048 121/95 mmHg     Pulse Rate 12/21/14 0048 91     Resp 12/21/14 0048 21     Temp 12/21/14 0048 98.3 F (36.8 C)     Temp Source 12/21/14 0048 Oral     SpO2 12/21/14 0048 95 %     Weight 12/21/14 0048 156 lb (70.761 kg)     Height 12/21/14 0048 5\' 6"  (1.676  m)     Head Cir --      Peak Flow --      Pain Score 12/21/14 0105 10     Pain Loc --      Pain Edu? --      Excl. in GC? --     Constitutional: Alert and oriented. Well appearing and in mild acute distress. Eyes: Conjunctivae are normal. PERRL. EOMI. Head: Atraumatic. Nose: No congestion/rhinnorhea. Mouth/Throat: Mucous membranes are moist.  Oropharynx non-erythematous. Neck: No stridor. No cervical spine tenderness to palpation. Cardiovascular: Normal rate, regular rhythm. Grossly normal heart sounds.  Good peripheral circulation. Respiratory: Normal respiratory effort.  No retractions. Lungs CTAB. Gastrointestinal: Soft and nontender. No distention. No abdominal bruits. No CVA tenderness. Musculoskeletal: Thoracic spine mildly tender to palpation without step off or deformity. No lower extremity tenderness nor edema.  No joint  effusions. Neurologic:  Normal speech and language. No gross focal neurologic deficits are appreciated. No gait instability. Skin:  Skin is warm, dry and intact. No rash noted. Psychiatric: Mood and affect are normal. Speech and behavior are normal.  ____________________________________________   LABS (all labs ordered are listed, but only abnormal results are displayed)  Labs Reviewed - No data to display ____________________________________________  EKG  None ____________________________________________  RADIOLOGY  Chest 2 view (view by me, interpreted per Dr. Manus Gunning): No acute pulmonary process. No displaced rib fracture.  Thoracic spine 2 view (view by me, interpreted per Dr. Manus Gunning): Negative. ____________________________________________   PROCEDURES  Procedure(s) performed: None  Critical Care performed: No  ____________________________________________   INITIAL IMPRESSION / ASSESSMENT AND PLAN / ED COURSE  Pertinent labs & imaging results that were available during my care of the patient were reviewed by me and considered in my medical decision making (see chart for details).  49 year old female with acute on chronic back pain secondary to mechanical fall. Will obtain imaging studies. I did note patient's recent ED visit and her narcotics history. Will give 1 dose of analgesia in the emergency department for symptomatic pain relief.  ----------------------------------------- 5:03 AM on 12/21/2014 -----------------------------------------  Pain improved. Discussed with patient who has an upcoming appointment with the [facility] pain clinic she will need to speak with her doctor regarding additional narcotic pain medicines. Strict return precautions given. Patient verbalizes understanding and agrees with plan of care. ____________________________________________   FINAL CLINICAL IMPRESSION(S) / ED DIAGNOSES  Final diagnoses:  Fall, initial encounter  Bilateral  thoracic back pain      Irean Hong, MD 12/21/14 0700

## 2014-12-21 NOTE — ED Notes (Signed)
MD at bedside. 

## 2014-12-21 NOTE — ED Notes (Signed)
Patient transported to X-ray 

## 2014-12-21 NOTE — ED Notes (Signed)
[place] tray provided.

## 2014-12-21 NOTE — ED Notes (Signed)
Pt BIB EMS from [place], pt s/p fall down 6 steps post sleep aide. Pt stated she took an Ambien to sleep around 9 or 10pm, pt stated she couldn't sleep. History of bulging disc in back, s/p right shoulder surgery 2 months ago, and sciatic nerve right side. EMS: 132/89, 96% RA, HR 95, RR 16.

## 2014-12-21 NOTE — Discharge Instructions (Signed)
1. Apply moist heat to affected area several times daily. 2. Use incentive spirometer as directed. 3. Return to the ER for worsening symptoms, persistent vomiting, difficulty breathing or other concerns.  Fall Prevention and Home Safety Falls cause injuries and can affect all age groups. It is possible to use preventive measures to significantly decrease the likelihood of falls. There are many simple measures which can make your home safer and prevent falls. OUTDOORS  Repair cracks and edges of walkways and driveways.  Remove high doorway thresholds.  Trim shrubbery on the main path into your home.  Have good outside lighting.  Clear walkways of tools, rocks, debris, and clutter.  Check that handrails are not broken and are securely fastened. Both sides of steps should have handrails.  Have leaves, snow, and ice cleared regularly.  Use sand or salt on walkways during winter months.  In the garage, clean up grease or oil spills. BATHROOM  Install night lights.  Install grab bars by the toilet and in the tub and shower.  Use non-skid mats or decals in the tub or shower.  Place a plastic non-slip stool in the shower to sit on, if needed.  Keep floors dry and clean up all water on the floor immediately.  Remove soap buildup in the tub or shower on a regular basis.  Secure bath mats with non-slip, double-sided rug tape.  Remove throw rugs and tripping hazards from the floors. BEDROOMS  Install night lights.  Make sure a bedside light is easy to reach.  Do not use oversized bedding.  Keep a telephone by your bedside.  Have a firm chair with side arms to use for getting dressed.  Remove throw rugs and tripping hazards from the floor. KITCHEN  Keep handles on pots and pans turned toward the center of the stove. Use back burners when possible.  Clean up spills quickly and allow time for drying.  Avoid walking on wet floors.  Avoid hot utensils and  knives.  Position shelves so they are not too high or low.  Place commonly used objects within easy reach.  If necessary, use a sturdy step stool with a grab bar when reaching.  Keep electrical cables out of the way.  Do not use floor polish or wax that makes floors slippery. If you must use wax, use non-skid floor wax.  Remove throw rugs and tripping hazards from the floor. STAIRWAYS  Never leave objects on stairs.  Place handrails on both sides of stairways and use them. Fix any loose handrails. Make sure handrails on both sides of the stairways are as long as the stairs.  Check carpeting to make sure it is firmly attached along stairs. Make repairs to worn or loose carpet promptly.  Avoid placing throw rugs at the top or bottom of stairways, or properly secure the rug with carpet tape to prevent slippage. Get rid of throw rugs, if possible.  Have an electrician put in a light switch at the top and bottom of the stairs. OTHER FALL PREVENTION TIPS  Wear low-heel or rubber-soled shoes that are supportive and fit well. Wear closed toe shoes.  When using a stepladder, make sure it is fully opened and both spreaders are firmly locked. Do not climb a closed stepladder.  Add color or contrast paint or tape to grab bars and handrails in your home. Place contrasting color strips on first and last steps.  Learn and use mobility aids as needed. Install an electrical emergency response system.  Turn on lights to avoid dark areas. Replace light bulbs that burn out immediately. Get light switches that glow.  Arrange furniture to create clear pathways. Keep furniture in the same place.  Firmly attach carpet with non-skid or double-sided tape.  Eliminate uneven floor surfaces.  Select a carpet pattern that does not visually hide the edge of steps.  Be aware of all pets. OTHER HOME SAFETY TIPS  Set the water temperature for 120 F (48.8 C).  Keep emergency numbers on or near the  telephone.  Keep smoke detectors on every level of the home and near sleeping areas. Document Released: 04/03/2002 Document Revised: 10/13/2011 Document Reviewed: 07/03/2011 [facility] Patient Information 2015 [place], [place]. This information is not intended to replace advice given to you by your health care provider. Make sure you discuss any questions you have with your health care provider.  Back Pain, Adult Low back pain is very common. About 1 in 5 people have back pain.The cause of low back pain is rarely dangerous. The pain often gets better over time.About half of people with a sudden onset of back pain feel better in just 2 weeks. About 8 in 10 people feel better by 6 weeks.  CAUSES Some common causes of back pain include:  Strain of the muscles or ligaments supporting the spine.  Wear and tear (degeneration) of the spinal discs.  Arthritis.  Direct injury to the back. DIAGNOSIS Most of the time, the direct cause of low back pain is not known.However, back pain can be treated effectively even when the exact cause of the pain is unknown.Answering your caregiver's questions about your overall health and symptoms is one of the most accurate ways to make sure the cause of your pain is not dangerous. If your caregiver needs more information, he or she may order lab work or imaging tests (X-rays or MRIs).However, even if imaging tests show changes in your back, this usually does not require surgery. HOME CARE INSTRUCTIONS For many people, back pain returns.Since low back pain is rarely dangerous, it is often a condition that people can learn to [facility] their own.   Remain active. It is stressful on the back to sit or stand in one place. Do not sit, drive, or stand in one place for more than 30 minutes at a time. Take short walks on level surfaces as soon as pain allows.Try to increase the length of time you walk each day.  Do not stay in bed.Resting more than 1 or 2 days can  delay your recovery.  Do not avoid exercise or work.Your body is made to move.It is not dangerous to be active, even though your back may hurt.Your back will likely heal faster if you return to being active before your pain is gone.  Pay attention to your body when you bend and lift. Many people have less discomfortwhen lifting if they bend their knees, keep the load close to their bodies,and avoid twisting. Often, the most comfortable positions are those that put less stress on your recovering back.  Find a comfortable position to sleep. Use a firm mattress and lie on your side with your knees slightly bent. If you lie on your back, put a pillow under your knees.  Only take over-the-counter or prescription medicines as directed by your caregiver. Over-the-counter medicines to reduce pain and inflammation are often the most helpful.Your caregiver may prescribe muscle relaxant drugs.These medicines help dull your pain so you can more quickly return to your normal activities and  healthy exercise.  Put ice on the injured area.  Put ice in a plastic bag.  Place a towel between your skin and the bag.  Leave the ice on for 15-20 minutes, 03-04 times a day for the first 2 to 3 days. After that, ice and heat may be alternated to reduce pain and spasms.  Ask your caregiver about trying back exercises and gentle massage. This may be of some benefit.  Avoid feeling anxious or stressed.Stress increases muscle tension and can worsen back pain.It is important to recognize when you are anxious or stressed and learn ways to manage it.Exercise is a great option. SEEK MEDICAL CARE IF:  You have pain that is not relieved with rest or medicine.  You have pain that does not improve in 1 week.  You have new symptoms.  You are generally not feeling well. SEEK IMMEDIATE MEDICAL CARE IF:   You have pain that radiates from your back into your legs.  You develop new bowel or bladder control  problems.  You have unusual weakness or numbness in your arms or legs.  You develop nausea or vomiting.  You develop abdominal pain.  You feel faint. Document Released: 04/13/2005 Document Revised: 10/13/2011 Document Reviewed: 08/15/2013 [facility] Patient Information 2015 [place], [place]. This information is not intended to replace advice given to you by your health care provider. Make sure you discuss any questions you have with your health care provider.  Thoracic Strain You have injured the muscles or tendons that attach to the upper part of your back behind your chest. This injury is called a thoracic strain, thoracic sprain, or mid-back strain.  CAUSES  The cause of thoracic strain varies. A less severe injury involves pulling a muscle or tendon without tearing it. A more severe injury involves tearing (rupturing) a muscle or tendon. With less severe injuries, there may be little loss of strength. Sometimes, there are breaks (fractures) in the bones to which the muscles are attached. These fractures are rare, unless there was a direct hit (trauma) or you have weak bones due to osteoporosis or age. Longstanding strains may be caused by overuse or improper form during certain movements. Obesity can also increase your risk for back injuries. Sudden strains may occur due to injury or not warming up properly before exercise. Often, there is no obvious cause for a thoracic strain. SYMPTOMS  The main symptom is pain, especially with movement, such as during exercise. DIAGNOSIS  Your caregiver can usually tell what is wrong by taking an X-ray and doing a physical exam. TREATMENT   Physical therapy may be helpful for recovery. Your caregiver can give you exercises to do or refer you to a physical therapist after your pain improves.  After your pain improves, strengthening and conditioning programs appropriate for your sport or occupation may be helpful.  Always warm up before physical  activities or athletics. Stretching after physical activity may also help.  Certain over-the-counter medicines may also help. Ask your caregiver if there are medicines that would help you. If this is your first thoracic strain injury, proper care and proper healing time before starting activities should prevent long-term problems. Torn ligaments and tendons require as long to heal as broken bones. Average healing times may be only 1 week for a mild strain. For torn muscles and tendons, healing time may be up to 6 weeks to 2 months. HOME CARE INSTRUCTIONS   Apply ice to the injured area. Ice massages may also be used as  directed.  Put ice in a plastic bag.  Place a towel between your skin and the bag.  Leave the ice on for 15-20 minutes, 03-04 times a day, for the first 2 days.  Only take over-the-counter or prescription medicines for pain, discomfort, or fever as directed by your caregiver.  Keep your appointments for physical therapy if this was prescribed.  Use wraps and back braces as instructed. SEEK IMMEDIATE MEDICAL CARE IF:   You have an increase in bruising, swelling, or pain.  Your pain has not improved with medicines.  You develop new shortness of breath, chest pain, or fever.  Problems seem to be getting worse rather than better. MAKE SURE YOU:   Understand these instructions.  Will watch your condition.  Will get help right away if you are not doing well or get worse. Document Released: 07/04/2003 Document Revised: 07/06/2011 Document Reviewed: 05/30/2010 [facility] Patient Information 2015 [place], [place]. This information is not intended to replace advice given to you by your health care provider. Make sure you discuss any questions you have with your health care provider.

## 2014-12-22 ENCOUNTER — Emergency Department
Admission: EM | Admit: 2014-12-22 | Discharge: 2014-12-22 | Disposition: A | Discharge status: Home / Self Care | Payer: Medicaid Other | Attending: Emergency Medicine | Admitting: Emergency Medicine

## 2014-12-22 DIAGNOSIS — R197 Diarrhea, unspecified: Secondary | ICD-10-CM | POA: Diagnosis present

## 2014-12-22 DIAGNOSIS — Z79899 Other long term (current) drug therapy: Secondary | ICD-10-CM | POA: Diagnosis not present

## 2014-12-22 DIAGNOSIS — Z720 Tobacco use: Secondary | ICD-10-CM | POA: Diagnosis not present

## 2014-12-22 DIAGNOSIS — F1123 Opioid dependence with withdrawal: Secondary | ICD-10-CM | POA: Insufficient documentation

## 2014-12-22 DIAGNOSIS — F1193 Opioid use, unspecified with withdrawal: Secondary | ICD-10-CM

## 2014-12-22 DIAGNOSIS — M25511 Pain in right shoulder: Secondary | ICD-10-CM | POA: Insufficient documentation

## 2014-12-22 DIAGNOSIS — Z9889 Other specified postprocedural states: Secondary | ICD-10-CM | POA: Diagnosis not present

## 2014-12-22 LAB — COMPREHENSIVE METABOLIC PANEL
ALT: 15 U/L (ref 14–54)
ANION GAP: 12 (ref 5–15)
AST: 25 U/L (ref 15–41)
Albumin: 4.1 g/dL (ref 3.5–5.0)
Alkaline Phosphatase: 76 U/L (ref 38–126)
BILIRUBIN TOTAL: 0.9 mg/dL (ref 0.3–1.2)
BUN: 11 mg/dL (ref 6–20)
CO2: 26 mmol/L (ref 22–32)
Calcium: 9.1 mg/dL (ref 8.9–10.3)
Chloride: 102 mmol/L (ref 101–111)
Creatinine, Ser: 0.9 mg/dL (ref 0.44–1.00)
GFR calc non Af Amer: >60 mL/min (ref >60)
Glucose, Bld: 133 mg/dL — ABNORMAL HIGH (ref 65–99)
Potassium: 3.1 mmol/L — ABNORMAL LOW (ref 3.5–5.1)
Sodium: 140 mmol/L (ref 135–145)
TOTAL PROTEIN: 7.5 g/dL (ref 6.5–8.1)

## 2014-12-22 LAB — URINALYSIS COMPLETE WITH MICROSCOPIC (ARMC ONLY)
BILIRUBIN URINE: NEGATIVE
Bacteria, UA: NONE SEEN
GLUCOSE, UA: NEGATIVE mg/dL
Hgb urine dipstick: NEGATIVE
Leukocytes, UA: NEGATIVE
Nitrite: NEGATIVE
Protein, ur: 30 mg/dL — AB
RBC / HPF: NONE SEEN RBC/hpf (ref 0–5)
Specific Gravity, Urine: 1.023 (ref 1.005–1.030)
pH: 8 (ref 5.0–8.0)

## 2014-12-22 LAB — CBC WITH DIFFERENTIAL/PLATELET
Basophils Absolute: 0.1 10*3/uL (ref 0–0.1)
Basophils Relative: 1 %
EOS ABS: 0.2 10*3/uL (ref 0–0.7)
EOS PCT: 2 %
HCT: 39.6 % (ref 35.0–47.0)
Hemoglobin: 13.3 g/dL (ref 12.0–16.0)
Lymphocytes Relative: 29 %
Lymphs Abs: 2.9 10*3/uL (ref 1.0–3.6)
MCH: 28.4 pg (ref 26.0–34.0)
MCHC: 33.6 g/dL (ref 32.0–36.0)
MCV: 84.4 fL (ref 80.0–100.0)
MONOS PCT: 6 %
Monocytes Absolute: 0.6 10*3/uL (ref 0.2–0.9)
Neutro Abs: 6.2 10*3/uL (ref 1.4–6.5)
Neutrophils Relative %: 62 %
PLATELETS: 442 10*3/uL — AB (ref 150–440)
RBC: 4.69 MIL/uL (ref 3.80–5.20)
RDW: 15.5 % — ABNORMAL HIGH (ref 11.5–14.5)
WBC: 9.9 10*3/uL (ref 3.6–11.0)

## 2014-12-22 LAB — TROPONIN I: Troponin I: <0.03 ng/mL (ref <0.031)

## 2014-12-22 MED ORDER — SODIUM CHLORIDE 0.9 % IV BOLUS (SEPSIS)
1000.0000 mL | Freq: Once | INTRAVENOUS | Status: AC
  Administered 2014-12-22: 1000 mL via INTRAVENOUS

## 2014-12-22 MED ORDER — HYDROXYZINE HCL 25 MG PO TABS
ORAL_TABLET | ORAL | Status: AC
  Administered 2014-12-22: 25 mg via ORAL
  Filled 2014-12-22: qty 1

## 2014-12-22 MED ORDER — PROMETHAZINE HCL 12.5 MG PO TABS: 12.5000 mg | ORAL_TABLET | Freq: Four times a day (QID) | ORAL | Status: DC | PRN

## 2014-12-22 MED ORDER — PROMETHAZINE HCL 25 MG/ML IJ SOLN
25.0000 mg | Freq: Once | INTRAMUSCULAR | Status: AC
  Administered 2014-12-22: 25 mg via INTRAVENOUS
  Filled 2014-12-22: qty 1

## 2014-12-22 MED ORDER — KETOROLAC TROMETHAMINE 30 MG/ML IJ SOLN
30.0000 mg | Freq: Once | INTRAMUSCULAR | Status: AC
  Administered 2014-12-22: 30 mg via INTRAVENOUS

## 2014-12-22 MED ORDER — ONDANSETRON HCL 4 MG/2ML IJ SOLN: 4.0000 mg | Freq: Once | INTRAMUSCULAR | Status: DC

## 2014-12-22 MED ORDER — ONDANSETRON HCL 4 MG/2ML IJ SOLN
INTRAMUSCULAR | Status: AC
  Filled 2014-12-22: qty 2

## 2014-12-22 MED ORDER — KETOROLAC TROMETHAMINE 30 MG/ML IJ SOLN
INTRAMUSCULAR | Status: AC
  Administered 2014-12-22: 30 mg via INTRAVENOUS
  Filled 2014-12-22: qty 1

## 2014-12-22 MED ORDER — HYDROXYZINE HCL 25 MG PO TABS
25.0000 mg | ORAL_TABLET | Freq: Once | ORAL | Status: AC
  Administered 2014-12-22: 25 mg via ORAL

## 2014-12-22 MED ORDER — CLONIDINE HCL 0.1 MG PO TABS
0.1000 mg | ORAL_TABLET | Freq: Once | ORAL | Status: AC
  Administered 2014-12-22: 0.1 mg via ORAL
  Filled 2014-12-22: qty 1

## 2014-12-22 MED ORDER — ONDANSETRON HCL 4 MG/2ML IJ SOLN
4.0000 mg | Freq: Once | INTRAMUSCULAR | Status: AC
  Administered 2014-12-22: 4 mg via INTRAVENOUS

## 2014-12-22 MED ORDER — LOPERAMIDE HCL 2 MG PO CAPS
2.0000 mg | ORAL_CAPSULE | Freq: Once | ORAL | Status: AC
  Administered 2014-12-22: 2 mg via ORAL

## 2014-12-22 MED ORDER — CLONIDINE HCL 0.1 MG PO TABS: 0.1000 mg | ORAL_TABLET | Freq: Two times a day (BID) | ORAL | Status: DC

## 2014-12-22 MED ORDER — LOPERAMIDE HCL 2 MG PO CAPS
ORAL_CAPSULE | ORAL | Status: AC
  Administered 2014-12-22: 2 mg via ORAL
  Filled 2014-12-22: qty 1

## 2014-12-22 NOTE — ED Provider Notes (Signed)
[facility] Emergency Department Provider Note  Time seen: 8:47 AM  I have reviewed the triage vital signs and the nursing notes.   HISTORY  Chief Complaint Diarrhea; Emesis; and Chills    HPI Shawna [name] is a 49 y.o. female with a past medical history of shoulder surgery in June, who presents to the emergency department with nausea, vomiting, diarrhea, diffuse weakness, chills. The patient states she has been on OP weight pain medication for 4-5 months at this point. She was stopped cold [place], and is currently awaiting to get in with pain management clinic.Patient believes her symptoms are due to opiate withdrawal. Patient denies any fever, chest pain, shortness of breath. Denies any black or bloody stool or vomit. States the pain in her shoulder is unchanged since her surgery.     Past Medical History  Diagnosis Date  . Sciatica of right side   . Bulging lumbar disc   . Insomnia   . Sciatic leg pain     Right side  . Weakness     Bil knees, and ankles  . Bulging lumbar disc     There are no active problems to display for this patient.   Past Surgical History  Procedure Laterality Date  . Tubal ligation    . Abdominal hysterectomy      Partial  . Tonsillectomy    . Shoulder surgery Right     Current Outpatient Rx  Name  Route  Sig  Dispense  Refill  . HYDROcodone-acetaminophen (NORCO/VICODIN) 5-325 MG per tablet   Oral   Take 1 tablet by mouth every 6 (six) hours as needed for moderate pain.         . hydrOXYzine (ATARAX/VISTARIL) 25 MG tablet   Oral   Take 25 mg by mouth at bedtime as needed.         Marland Kitchen omeprazole (PRILOSEC) 40 MG capsule   Oral   Take 40 mg by mouth daily. QHS         . traMADol (ULTRAM) 50 MG tablet   Oral   Take 2 tablets (100 mg total) by mouth every 6 (six) hours as needed for moderate pain.   30 tablet   0   . zolpidem (AMBIEN) 10 MG tablet   Oral   Take 10 mg by mouth at bedtime.            Allergies Morphine and related  Family History  Problem Relation Age of Onset  . Fibromyalgia Mother     Social History Social History  Substance Use Topics  . Smoking status: Current Every Day Smoker -- 1.00 packs/day for 40 years    Types: Cigarettes  . Smokeless tobacco: Never Used  . Alcohol Use: No    Review of Systems Constitutional: Negative for fever Cardiovascular: Negative for chest pain. Respiratory: Negative for shortness of breath. Gastrointestinal: Positive for abdominal cramping. Positive for nausea, vomiting, diarrhea. Genitourinary: Negative for dysuria. Neurological: Negative for headache 10-point ROS otherwise negative.  ____________________________________________   PHYSICAL EXAM:  VITAL SIGNS: ED Triage Vitals  Enc Vitals Group     BP 12/22/14 0617 115/95 mmHg     Pulse Rate 12/22/14 0617 101     Resp 12/22/14 0617 20     Temp 12/22/14 0617 97 F (36.1 C)     Temp Source 12/22/14 0617 Oral     SpO2 12/22/14 0617 99 %     Weight 12/22/14 0617 156 lb (70.761 kg)  Height 12/22/14 0617  (1.676 m)     Head Cir --      Peak Flow --      Pain Score 12/22/14 0617 9     Pain Loc --      Pain Edu? --      Excl. in GC? --     Constitutional: Alert and oriented. Well appearing and in no distress. Eyes: Normal exam ENT   Mouth/Throat: Dry mucous membranes. Cardiovascular: Normal rate, regular rhythm. Respiratory: Normal respiratory effort without tachypnea nor retractions. Breath sounds are clear and equal bilaterally. No wheezes/rales/rhonchi. Gastrointestinal: Soft and nontender. No distention.  Musculoskeletal: Moderate tenderness to palpation of the right shoulder. No erythema. Neurovascularly intact distally. Otherwise extremities appear well. Neurologic:  Normal speech and language. No gross focal neurologic deficits are appreciated. Speech is normal. Skin:  Skin is warm, dry and intact.  Psychiatric: Mood and affect are  normal. Speech and behavior are normal. Patient exhibits appropriate insight and judgment.  ____________________________________________    INITIAL IMPRESSION / ASSESSMENT AND PLAN / ED COURSE  Pertinent labs & imaging results that were available during my care of the patient were reviewed by me and considered in my medical decision making (see chart for details).  Patient with abdominal cramping, generalized weakness, nausea, vomiting, diarrhea since stopping opiate pain medications. We'll monitor closely in the emergency department, IV hydrate, treat with clonidine and Phenergan. We will check labs.  Labs largely within normal limits. We will discharge patient home on a short course of clonidine as well as loperamide and Phenergan. Patient agreeable to plan and will follow up with her doctor on Monday.  ____________________________________________   FINAL CLINICAL IMPRESSION(S) / ED DIAGNOSES  Opiate withdrawal Diarrhea Nausea   Minna Antis, MD 12/22/14 [phone]

## 2014-12-22 NOTE — ED Notes (Signed)
Pt states "i think i'm going through withdrawal from the medicine they gave me after my shoulder surgery". Pt states was taking oxycodone and now has chills, back pain, "hot flashes", emesis, nausea and diarrhea.

## 2014-12-22 NOTE — ED Notes (Signed)
Patient states she is unable to tolerate IV fluids. States "It's making my arm cold and that doesn't work with having metal in my shoulder". Dr. Lenard Lance notified. IV fluids stopped. Patient received Toradol and Immodium per patient request for pain medication and diarrhea.

## 2014-12-22 NOTE — ED Notes (Signed)
Pt placed in subwait provided with call bell.

## 2014-12-22 NOTE — ED Notes (Signed)
Spoke with dr. Manson Passey regarding pt's symptoms and chief complaint, order for iv zofran received, no orders for blood work received.

## 2014-12-22 NOTE — Discharge Instructions (Signed)
Chemical Dependency Chemical dependency is an addiction to drugs or alcohol. It is characterized by the repeated behavior of seeking out and using drugs and alcohol despite harmful consequences to the health and safety of ones self and others.  RISK FACTORS There are certain situations or behaviors that increase a person's risk for chemical dependency. These include:  A family history of chemical dependency.  A history of mental health issues, including depression and anxiety.  A home environment where drugs and alcohol are easily available to you.  Drug or alcohol use at a young age. SYMPTOMS  The following symptoms can indicate chemical dependency:  Inability to limit the use of drugs or alcohol.  Nausea, sweating, shakiness, and anxiety that occurs when alcohol or drugs are not being used.  An increase in amount of drugs or alcohol that is necessary to get drunk or high. People who experience these symptoms can assess their use of drugs and alcohol by asking themselves the following questions:  Have you been told by friends or family that they are worried about your use of alcohol or drugs?  Do friends and family ever tell you about things you did while drinking alcohol or using drugs that you do not remember?  Do you lie about using alcohol or drugs or about the amounts you use?  Do you have difficulty completing daily tasks unless you use alcohol or drugs?  Is the level of your work or school performance lower because of your drug or alcohol use?  Do you get sick from using drugs or alcohol but keep using anyway?  Do you feel uncomfortable in social situations unless you use alcohol or drugs?  Do you use drugs or alcohol to help forget problems? An answer of yes to any of these questions may indicate chemical dependency. Professional evaluation is suggested. Document Released: 04/07/2001 Document Revised: 07/06/2011 Document Reviewed: 06/19/2010 [facility] Patient  Information 2015 [place], [place]. This information is not intended to replace advice given to you by your health care provider. Make sure you discuss any questions you have with your health care provider.  Opioid Withdrawal Opioids are a group of narcotic drugs. They include the street drug heroin. They also include pain medicines, such as morphine, hydrocodone, oxycodone, and fentanyl. Opioid withdrawal is a group of characteristic physical and mental signs and symptoms. It typically occurs if you have been using opioids daily for several weeks or longer and stop using or rapidly decrease use. Opioid withdrawal can also occur if you have used opioids daily for a long time and are given a medicine to block the effect.  SIGNS AND SYMPTOMS Opioid withdrawal includes three or more of the following symptoms:   Depressed, anxious, or irritable mood.  Nausea or vomiting.  Muscle aches or spasms.   Watery eyes.   Runny nose.  Dilated pupils, sweating, or hairs standing on end.  Diarrhea or intestinal cramping.  Yawning.   Fever.  Increased blood pressure.  Fast pulse.  Restlessness or trouble sleeping. These signs and symptoms occur within several hours of stopping or reducing short-acting opioids, such as heroin. They can occur within 3 days of stopping or reducing long-acting opioids, such as methadone. Withdrawal begins within minutes of receiving a drug that blocks the effects of opioids, such as naltrexone or naloxone. DIAGNOSIS  Opioid use disorder is diagnosed by your health care provider. You will be asked about your symptoms, drug and alcohol use, medical history, and use of medicines. A physical exam may  be done. Lab tests may be ordered. Your health care provider may have you see a mental health professional.  TREATMENT  The treatment for opioid withdrawal is usually provided by medical doctors with special training in substance use disorders (addiction specialists). The  following medicines may be included in treatment:  Opioids given in place of the abused opioid. They turn on opioid receptors in the brain and lessen or prevent withdrawal symptoms. They are gradually decreased (opioid substitution and taper).  Non-opioids that can lessen certain opioid withdrawal symptoms. They may be used alone or with opioid substitution and taper. Successful long-term recovery usually requires medicine, counseling, and group support. HOME CARE INSTRUCTIONS   Take medicines only as directed by your health care provider.  Check with your health care provider before starting new medicines.  Keep all follow-up visits as directed by your health care provider. SEEK MEDICAL CARE IF:  You are not able to take your medicines as directed.  Your symptoms get worse.  You relapse. SEEK IMMEDIATE MEDICAL CARE IF:  You have serious thoughts about hurting yourself or others.  You have a seizure.  You lose consciousness. Document Released: 04/16/2003 Document Revised: 08/28/2013 Document Reviewed: 04/26/2013 [facility] Patient Information 2015 [place], [place]. This information is not intended to replace advice given to you by your health care provider. Make sure you discuss any questions you have with your health care provider.

## 2014-12-29 LAB — POCT PREGNANCY, URINE: Preg Test, Ur: NEGATIVE

## 2014-12-30 ENCOUNTER — Emergency Department
Admission: EM | Admit: 2014-12-30 | Discharge: 2014-12-31 | Disposition: A | Discharge status: Other Acute Inpatient Hospital | Payer: Medicaid Other | Attending: Emergency Medicine | Admitting: Emergency Medicine

## 2014-12-30 DIAGNOSIS — Z79899 Other long term (current) drug therapy: Secondary | ICD-10-CM | POA: Diagnosis not present

## 2014-12-30 DIAGNOSIS — F19959 Other psychoactive substance use, unspecified with psychoactive substance-induced psychotic disorder, unspecified: Secondary | ICD-10-CM

## 2014-12-30 DIAGNOSIS — F19259 Other psychoactive substance dependence with psychoactive substance-induced psychotic disorder, unspecified: Secondary | ICD-10-CM

## 2014-12-30 DIAGNOSIS — F1120 Opioid dependence, uncomplicated: Secondary | ICD-10-CM | POA: Diagnosis not present

## 2014-12-30 DIAGNOSIS — F19939 Other psychoactive substance use, unspecified with withdrawal, unspecified: Secondary | ICD-10-CM

## 2014-12-30 DIAGNOSIS — Z720 Tobacco use: Secondary | ICD-10-CM | POA: Insufficient documentation

## 2014-12-30 DIAGNOSIS — F419 Anxiety disorder, unspecified: Secondary | ICD-10-CM

## 2014-12-30 DIAGNOSIS — F1310 Sedative, hypnotic or anxiolytic abuse, uncomplicated: Secondary | ICD-10-CM | POA: Diagnosis not present

## 2014-12-30 DIAGNOSIS — F19239 Other psychoactive substance dependence with withdrawal, unspecified: Secondary | ICD-10-CM

## 2014-12-30 DIAGNOSIS — Z008 Encounter for other general examination: Secondary | ICD-10-CM | POA: Diagnosis present

## 2014-12-30 DIAGNOSIS — F19951 Other psychoactive substance use, unspecified with psychoactive substance-induced psychotic disorder with hallucinations: Secondary | ICD-10-CM | POA: Diagnosis not present

## 2014-12-30 LAB — COMPREHENSIVE METABOLIC PANEL
ALBUMIN: 4.2 g/dL (ref 3.5–5.0)
ALT: 11 U/L — ABNORMAL LOW (ref 14–54)
AST: 20 U/L (ref 15–41)
Alkaline Phosphatase: 72 U/L (ref 38–126)
Anion gap: 10 (ref 5–15)
BUN: 17 mg/dL (ref 6–20)
CHLORIDE: 108 mmol/L (ref 101–111)
CO2: 22 mmol/L (ref 22–32)
Calcium: 9.1 mg/dL (ref 8.9–10.3)
Creatinine, Ser: 0.78 mg/dL (ref 0.44–1.00)
GFR calc Af Amer: >60 mL/min (ref >60)
GFR calc non Af Amer: >60 mL/min (ref >60)
GLUCOSE: 124 mg/dL — AB (ref 65–99)
POTASSIUM: 3.6 mmol/L (ref 3.5–5.1)
Sodium: 140 mmol/L (ref 135–145)
Total Bilirubin: 0.7 mg/dL (ref 0.3–1.2)
Total Protein: 7.1 g/dL (ref 6.5–8.1)

## 2014-12-30 LAB — CBC
HEMATOCRIT: 39.9 % (ref 35.0–47.0)
Hemoglobin: 13.4 g/dL (ref 12.0–16.0)
MCH: 28.9 pg (ref 26.0–34.0)
MCHC: 33.6 g/dL (ref 32.0–36.0)
MCV: 85.8 fL (ref 80.0–100.0)
Platelets: 389 10*3/uL (ref 150–440)
RBC: 4.65 MIL/uL (ref 3.80–5.20)
RDW: 16.1 % — ABNORMAL HIGH (ref 11.5–14.5)
WBC: 11.8 10*3/uL — AB (ref 3.6–11.0)

## 2014-12-30 LAB — SALICYLATE LEVEL: Salicylate Lvl: <4 mg/dL (ref 2.8–30.0)

## 2014-12-30 LAB — ETHANOL: Alcohol, Ethyl (B): <5 mg/dL (ref <5)

## 2014-12-30 LAB — ACETAMINOPHEN LEVEL: Acetaminophen (Tylenol), Serum: <10 ug/mL — ABNORMAL LOW (ref 10–30)

## 2014-12-30 MED ORDER — CLONIDINE HCL 0.1 MG PO TABS
0.1000 mg | ORAL_TABLET | Freq: Two times a day (BID) | ORAL | Status: DC
Start: 2014-12-30 — End: 2014-12-31
  Filled 2014-12-30: qty 1

## 2014-12-30 MED ORDER — PANTOPRAZOLE SODIUM 40 MG PO TBEC
40.0000 mg | DELAYED_RELEASE_TABLET | Freq: Every day | ORAL | Status: DC
  Administered 2014-12-30: 40 mg via ORAL
  Filled 2014-12-30: qty 1

## 2014-12-30 MED ORDER — IBUPROFEN 800 MG PO TABS
800.0000 mg | ORAL_TABLET | Freq: Three times a day (TID) | ORAL | Status: DC | PRN
  Administered 2014-12-30 – 2014-12-31 (×2): 800 mg via ORAL
  Filled 2014-12-30 (×3): qty 1

## 2014-12-30 MED ORDER — CLONAZEPAM 1 MG PO TABS
1.0000 mg | ORAL_TABLET | Freq: Two times a day (BID) | ORAL | Status: DC
  Administered 2014-12-30 – 2014-12-31 (×2): 1 mg via ORAL
  Filled 2014-12-30 (×2): qty 1

## 2014-12-30 NOTE — ED Notes (Signed)

## 2014-12-30 NOTE — ED Notes (Signed)
BEHAVIORAL HEALTH ROUNDING Patient sleeping: No. Patient alert and oriented: yes Behavior appropriate: Yes.  ; If no, describe:  Nutrition and fluids offered: Yes  Toileting and hygiene offered: Yes  Sitter present: no Law enforcement present: Yes  

## 2014-12-30 NOTE — ED Notes (Signed)
Per family - patient needs psych evaluation due to crying all the time, having hallicunations.

## 2014-12-30 NOTE — ED Notes (Signed)
Patient denies seeing or hearing things at this time, explains it as an "inner feeling".

## 2014-12-30 NOTE — ED Notes (Signed)
TTS at bedside. 

## 2014-12-30 NOTE — ED Notes (Signed)
Pt here for help with withdrawals from oxycodone and hydrocodone; pt says she was prescribed the medication for several months for her shoulder and then the medication was stopped suddenly; finds herself crying for no reason;

## 2014-12-30 NOTE — ED Provider Notes (Signed)
[facility] Emergency Department Provider Note REMINDER - THIS NOTE IS NOT A FINAL MEDICAL RECORD UNTIL IT IS SIGNED. UNTIL THEN, THE CONTENT BELOW MAY REFLECT INFORMATION FROM A DOCUMENTATION TEMPLATE, NOT THE ACTUAL PATIENT VISIT. ____________________________________________  Time seen: Approximately 9:34 PM  I have reviewed the triage vital signs and the nursing notes.   HISTORY  Chief Complaint Medical Clearance    HPI Shawna [name] is a 49 y.o. female history of previous shoulder surgery and then also significant problems with narcotic withdrawal after stopping. She presents today states that for the last day she having reports of symptoms from her family that she is been seeming off, specifically they reported that tonight she had an episode where that she called and was very upset but didn't recall this happening. She reports she has a strange "feeling" but denies that she is hearing anything, denies wanting to hurt herself or anyone else. She denies any hallucinations. She does report that she is currently on clonidine by her psychiatrist for narcotic withdrawal and does not seem to be helping too much.  She denies being in pain except for right shoulder which been chronically sore for several months. No fevers or chills. No recent illness. Denies pregnancy.   Past Medical History  Diagnosis Date  . Sciatica of right side   . Bulging lumbar disc   . Insomnia   . Sciatic leg pain     Right side  . Weakness     Bil knees, and ankles  . Bulging lumbar disc     There are no active problems to display for this patient.   Past Surgical History  Procedure Laterality Date  . Tubal ligation    . Abdominal hysterectomy      Partial  . Tonsillectomy    . Shoulder surgery Right     Current Outpatient Rx  Name  Route  Sig  Dispense  Refill  . clonazePAM (KLONOPIN) 1 MG tablet   Oral   Take 1 mg by mouth 2 (two) times daily.         Marland Kitchen ibuprofen  (ADVIL,MOTRIN) 800 MG tablet   Oral   Take 800 mg by mouth every 8 (eight) hours as needed for mild pain.         . cloNIDine (CATAPRES) 0.1 MG tablet   Oral   Take 1 tablet (0.1 mg total) by mouth 2 (two) times daily.   10 tablet   11   . HYDROcodone-acetaminophen (NORCO/VICODIN) 5-325 MG per tablet   Oral   Take 1 tablet by mouth every 6 (six) hours as needed for moderate pain.         . hydrOXYzine (ATARAX/VISTARIL) 25 MG tablet   Oral   Take 25 mg by mouth at bedtime as needed.         Marland Kitchen omeprazole (PRILOSEC) 40 MG capsule   Oral   Take 40 mg by mouth daily. QHS         . promethazine (PHENERGAN) 12.5 MG tablet   Oral   Take 1 tablet (12.5 mg total) by mouth every 6 (six) hours as needed for nausea or vomiting.   15 tablet   0   . traMADol (ULTRAM) 50 MG tablet   Oral   Take 2 tablets (100 mg total) by mouth every 6 (six) hours as needed for moderate pain.   30 tablet   0   . zolpidem (AMBIEN) 10 MG tablet   Oral  Take 10 mg by mouth at bedtime.           Allergies Morphine and related  Family History  Problem Relation Age of Onset  . Fibromyalgia Mother     Social History Social History  Substance Use Topics  . Smoking status: Current Every Day Smoker -- 1.00 packs/day for 40 years    Types: Cigarettes  . Smokeless tobacco: Never Used  . Alcohol Use: No    Review of Systems Constitutional: No fever/chills Eyes: No visual changes. ENT: No sore throat. Cardiovascular: Denies chest pain. Respiratory: Denies shortness of breath. Gastrointestinal: No abdominal pain.  No nausea, no vomiting.  No diarrhea.  No constipation. Genitourinary: Negative for dysuria. Musculoskeletal: Negative for back pain. Skin: Negative for rash. Neurological: Negative for headaches, focal weakness or numbness.  On repeat evaluation, the patient states that  10-point ROS otherwise negative.  ____________________________________________   PHYSICAL  EXAM:  VITAL SIGNS: ED Triage Vitals  Enc Vitals Group     BP 12/30/14 2027 114/75 mmHg     Pulse Rate 12/30/14 2027 103     Resp 12/30/14 2027 20     Temp 12/30/14 2027 98.7 F (37.1 C)     Temp Source 12/30/14 2027 Oral     SpO2 12/30/14 2027 96 %     Weight 12/30/14 2027 156 lb (70.761 kg)     Height 12/30/14 2027 5\' 6"  (1.676 m)     Head Cir --      Peak Flow --      Pain Score 12/30/14 2028 0     Pain Loc --      Pain Edu? --      Excl. in GC? --    Constitutional: Alert and oriented. Well appearing and in no acute distress. Eyes: Conjunctivae are normal. PERRL. EOMI. Head: Atraumatic. Nose: No congestion/rhinnorhea. Mouth/Throat: Mucous membranes are moist.  Oropharynx non-erythematous. Neck: No stridor.   Cardiovascular: Normal rate, regular rhythm. Grossly normal heart sounds.  Good peripheral circulation. Respiratory: Normal respiratory effort.  No retractions. Lungs CTAB. Gastrointestinal: Soft and nontender. No distention. No abdominal bruits. No CVA tenderness. Musculoskeletal: No lower extremity tenderness nor edema.  No joint effusions. Neurologic:  Normal speech and language. No gross focal neurologic deficits are appreciated. No gait instability. Skin:  Skin is warm, dry and intact. No rash noted. Psychiatric: Mood and affect are normal. Speech and behavior are normal.  Patient's exam very reassuring. She again on requestioning denies any acute psychiatric symptoms. No hallucinations, no SI no HI.  ____________________________________________   LABS (all labs ordered are listed, but only abnormal results are displayed)  Labs Reviewed  COMPREHENSIVE METABOLIC PANEL - Abnormal; Notable for the following:    Glucose, Bld 124 (*)    ALT 11 (*)    All other components within normal limits  ACETAMINOPHEN LEVEL - Abnormal; Notable for the following:    Acetaminophen (Tylenol), Serum <10 (*)    All other components within normal limits  CBC - Abnormal;  Notable for the following:    WBC 11.8 (*)    RDW 16.1 (*)    All other components within normal limits  ETHANOL  SALICYLATE LEVEL  URINE DRUG SCREEN, QUALITATIVE ([facility] ONLY)   ____________________________________________  EKG   ____________________________________________  RADIOLOGY   ____________________________________________   PROCEDURES  Procedure(s) performed: None  Critical Care performed: No  ____________________________________________   INITIAL IMPRESSION / ASSESSMENT AND PLAN / ED COURSE  Pertinent labs & imaging results that were  available during my care of the patient were reviewed by me and considered in my medical decision making (see chart for details).  Patient presents with concerns of odd feeling, possibly having some type of acting out at home, though this is not very clear. She denies any acute psychiatric symptoms which would suggest the need for involuntary commitment, but she does report feeling odd, feeling slightly off, and is slightly tearful. I will place the patient for consultation with psychiatry, discussed with the patient and she is very agreeable with this plan. She will be a voluntary patient for now, she has outpatient psychiatry and adamantly denies any desire to hurt herself and no report of actions taken that would suggest she is at risk of harming herself or others.  ----------------------------------------- 11:01 PM on 12/30/2014 -----------------------------------------  Patient medically cleared for psychiatric evaluation. Signed out to Dr. Zenda Alpers. Patient is voluntary at this time. ____________________________________________   FINAL CLINICAL IMPRESSION(S) / ED DIAGNOSES  Final diagnoses:  Anxiety      Sharyn Creamer, MD 12/30/14 [phone]

## 2014-12-30 NOTE — ED Notes (Signed)
MD at bedside. 

## 2014-12-31 DIAGNOSIS — F419 Anxiety disorder, unspecified: Secondary | ICD-10-CM | POA: Diagnosis not present

## 2014-12-31 DIAGNOSIS — F19951 Other psychoactive substance use, unspecified with psychoactive substance-induced psychotic disorder with hallucinations: Secondary | ICD-10-CM

## 2014-12-31 DIAGNOSIS — F19239 Other psychoactive substance dependence with withdrawal, unspecified: Secondary | ICD-10-CM

## 2014-12-31 DIAGNOSIS — F1120 Opioid dependence, uncomplicated: Secondary | ICD-10-CM

## 2014-12-31 DIAGNOSIS — F19939 Other psychoactive substance use, unspecified with withdrawal, unspecified: Secondary | ICD-10-CM

## 2014-12-31 DIAGNOSIS — F19259 Other psychoactive substance dependence with psychoactive substance-induced psychotic disorder, unspecified: Secondary | ICD-10-CM

## 2014-12-31 LAB — URINE DRUG SCREEN, QUALITATIVE (ARMC ONLY)
Amphetamines, Ur Screen: NOT DETECTED
Barbiturates, Ur Screen: NOT DETECTED
Benzodiazepine, Ur Scrn: POSITIVE — AB
CANNABINOID 50 NG, UR ~~LOC~~: NOT DETECTED
COCAINE METABOLITE, UR ~~LOC~~: NOT DETECTED
MDMA (ECSTASY) UR SCREEN: NOT DETECTED
Methadone Scn, Ur: NOT DETECTED
OPIATE, UR SCREEN: NOT DETECTED
PHENCYCLIDINE (PCP) UR S: NOT DETECTED
Tricyclic, Ur Screen: NOT DETECTED

## 2014-12-31 MED ORDER — ACETAMINOPHEN 500 MG PO TABS
1000.0000 mg | ORAL_TABLET | Freq: Once | ORAL | Status: AC
  Administered 2014-12-31: 1000 mg via ORAL

## 2014-12-31 MED ORDER — NICOTINE 21 MG/24HR TD PT24
MEDICATED_PATCH | TRANSDERMAL | Status: AC
  Administered 2014-12-31: 21 mg via TRANSDERMAL
  Filled 2014-12-31: qty 1

## 2014-12-31 MED ORDER — ACETAMINOPHEN 500 MG PO TABS
ORAL_TABLET | ORAL | Status: AC
  Administered 2014-12-31: 1000 mg via ORAL
  Filled 2014-12-31: qty 2

## 2014-12-31 MED ORDER — NICOTINE 21 MG/24HR TD PT24
21.0000 mg | MEDICATED_PATCH | Freq: Every day | TRANSDERMAL | Status: DC
  Administered 2014-12-31: 21 mg via TRANSDERMAL

## 2014-12-31 MED ORDER — LORATADINE 10 MG PO TABS
10.0000 mg | ORAL_TABLET | Freq: Every day | ORAL | Status: DC
  Administered 2014-12-31: 10 mg via ORAL

## 2014-12-31 MED ORDER — LORATADINE 10 MG PO TABS
ORAL_TABLET | ORAL | Status: AC
  Administered 2014-12-31: 10 mg via ORAL
  Filled 2014-12-31: qty 1

## 2014-12-31 NOTE — ED Notes (Signed)
Pt c/o of position in bed, given extra pillows, pt refused distraction (TV)

## 2014-12-31 NOTE — Discharge Instructions (Signed)

## 2014-12-31 NOTE — ED Notes (Signed)
BEHAVIORAL HEALTH ROUNDING Patient sleeping: No. Patient alert and oriented: yes Behavior appropriate: Yes.  ; If no, describe:  Nutrition and fluids offered: Yes  Toileting and hygiene offered: Yes  Sitter present: no Law enforcement present: Yes  

## 2014-12-31 NOTE — ED Notes (Signed)

## 2014-12-31 NOTE — ED Notes (Signed)
BEHAVIORAL HEALTH ROUNDING Patient sleeping: No. Patient alert and oriented: not applicable Behavior appropriate: Yes.  ; If no, describe: pt c/o right shoulder pain Nutrition and fluids offered: Yes  Toileting and hygiene offered: Yes  Sitter present: no Law enforcement present: Yes ODS

## 2014-12-31 NOTE — ED Notes (Signed)
BEHAVIORAL HEALTH ROUNDING Patient sleeping: Yes.   Patient alert and oriented: not applicable Behavior appropriate: Yes.   Nutrition and fluids offered: Yes  Toileting and hygiene offered: Yes  Sitter present: yes Law enforcement present: Yes  

## 2014-12-31 NOTE — ED Notes (Signed)
Pt sitting up watching TV, mattress rolled/positioned for pt comfort.

## 2014-12-31 NOTE — ED Notes (Signed)
Pt is upset that she was not able to have catapres for withdrawal, pt was informed of the negative side effect if her b/p was to drop too low.

## 2014-12-31 NOTE — ED Notes (Signed)
Patient escorted to the restroom by this nurse.  Escorted her back to bed.  Gave patient a new warm blanket.

## 2014-12-31 NOTE — ED Notes (Signed)
ED BHU PLACEMENT JUSTIFICATION Is the patient under IVC or is there intent for IVC: Yes.   Is the patient medically cleared: Yes.   Is there vacancy in the ED BHU: Yes.   Is the population mix appropriate for patient: Yes.   Is the patient awaiting placement in inpatient or outpatient setting: Yes.   Has the patient had a psychiatric consult: No. Survey of unit performed for contraband, proper placement and condition of furniture, tampering with fixtures in bathroom, shower, and each patient room: Yes.  ; Findings: all clear APPEARANCE/BEHAVIOR calm, cooperative and adequate rapport can be established NEURO ASSESSMENT Orientation: time, place and person Hallucinations: No.None noted (Hallucinations) Speech: Normal Gait: normal RESPIRATORY ASSESSMENT Normal expansion.  Clear to auscultation.  No rales, rhonchi, or wheezing. CARDIOVASCULAR ASSESSMENT regular rate and rhythm, S1, S2 normal, no murmur, click, rub or gallop GASTROINTESTINAL ASSESSMENT soft, nontender, BS WNL, no r/g EXTREMITIES normal strength, tone, and muscle mass, ROM of all joints is normal PLAN OF CARE Provide calm/safe environment. Vital signs assessed twice daily. ED BHU Assessment once each 12-hour shift. Collaborate with intake RN daily or as condition indicates. Assure the ED provider has rounded once each shift. Provide and encourage hygiene. Provide redirection as needed. Assess for escalating behavior; address immediately and inform ED provider.  Assess family dynamic and appropriateness for visitation as needed: Yes.  ; If necessary, describe findings:  Educate the patient/family about BHU procedures/visitation: Yes.  ; If necessary, describe findings:  
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
## 2014-12-31 NOTE — ED Notes (Signed)
Pt not transferred at this time because no appropriate room available in BHU.

## 2014-12-31 NOTE — ED Notes (Signed)
Pt c/o back pain and asked if she could possible get some tylenol for her pain, informed the pt that I will have to talk with the EDP to obtain an order.

## 2014-12-31 NOTE — ED Notes (Signed)
Spoke with pt daughter Tonna Corner who is suppose to be picking the pt up, states she is in route, pt informed.

## 2014-12-31 NOTE — Consult Note (Addendum)
Shawna Shawna [name] Psychiatry Consult   Reason for Consult:  "I'm better today."  Referring Physician:  Delman Kitten, MD  Patient Identification: Shawna Shawna [name] MRN:  [id number] Principal Diagnosis: Anxiety disorder Diagnosis:   Patient Active Problem List   Diagnosis Date Noted  . Opioid use disorder, moderate, dependence [F11.20] 12/31/2014  . Anxiety disorder, unspecified [F41.9] 12/31/2014   Total Time spent with patient: 30 minutes  Subjective:   Shawna Shawna [name] is a 48 y.o. female patient admitted with a h/o shoulder surgery then having issues with narcotic withdrawal after stopping medications. For Shawna past day, she has reports from family that she seems off. There was an episode where she called Shawna family very upset but Shawna Shawna [name] does not recall this happening. She denies hallucinations and wanting to harm herself or anyone else.  She reports being on clonidine from Shawna psychiatrist for narcotic withdrawal but this does not seem to be helping much.    Per nursing notes, Shawna Shawna [name]'s family shared she has been crying frequently and having hallucinations. Shawna [name] denied AVH but described it as an "inner feeling." Shawna [name] noted she is here for withdrawals and finds herself crying for no reason after oxycodone and hydrocodone were stopped abruptly after shoulder surgery.   Protective factors include Shawna children, grandchildren, religion, friends, Shawna [name] and sister.   Today on interview, Shawna Shawna [name] is tearful because she is ready to go home. She shared that she feels better than she did yesterday and wants help with Shawna withdrawals from pain medications. She has right shoulder replacement surgery on October 19, 2014 and since then has been on opioid analgesics. She took Hydrocodone, Oxycodone and tramadol. Around Shawna 1st of this month, Shawna Shawna [name]'s opioid analgesics were discontinued. She has been having withdrawals since that time. She was placed on clonidine for opiate withdrawal which has been somewhat helpful. She shared  opiate withdrawal symptoms include muscle aches & tearfulness. She noted she has done well in regards to depression since a hospitalization for a suicide attempt in November 2015.   She also shared she recalls Shawna feeling that something told Shawna "to go somewhere yesterday." This concerned Shawna Shawna [name] so she called Shawna daughter who brought Shawna Shawna [name] to Shawna ED. Shawna Shawna [name] stated Shawna episode lasted about 30 minutes then resolved. She is not certain why this happened and denies a similar episodes in Shawna past.   She denies having issues with tearfulness prior to Shawna surgery. She also denies dissociative symptoms.  She shared she is willing to follow-up with Shawna psychiatrist as soon as possible. She currently has an appt in October 2016.   She also shared Shawna sleep has been decreased since Shawna surgery. Shawna Shawna [name] stated she is not able to take Ambien at this time. She does endorse guilt. She denies anhedonia. She has variable changes in energy which she relates to decreased sleep. Attention and concentration are adequate. Shawna [name] denies SI, HI and AVH. Shawna [name] denies a h/o mania.    Other information: This provider spoke with Shawna Shawna [name]'s daughter, Shawna Shawna [name] at [phone]. Shawna daughter was concerned about Shawna issue that Shawna Shawna [name] felt like something spoke to Shawna yesterday telling Shawna "to go somewhere." She noted Shawna Shawna [name] called Shawna screaming and yelling. Shawna duration of Shawna event was about 30 minutes and due to Shawna Shawna [name]'s history, Shawna [name] was concerned for Shawna Shawna [name]'s psychiatric wellbeing and had Shawna come to Shawna ED.   Shawna [name] stated Shawna Shawna [name] has not been sleeping well recently. She shared  Shawna Shawna [name] has experienced pain from Shawna shoulder surgery and feels no one is listening to Shawna in regards to pain and withdrawals. Shawna [name]'s daughter feels Shawna Shawna [name]'s would be safe at home. Shawna [name]'s daughter contracted for safety. Shawna [name]'s daughter can get Shawna [name] to Shawna psychiatrist in Shawna next 1-2 weeks. Shawna [name]'s daughter asked for a note stating Shawna Shawna [name] needs a care taker to ride  with Shawna to psychiatric appts.   HPI:  See above HPI Elements:   Quality:  resolved. Severity:  mild to moderate. Timing:  yesterday. Duration:  since shoulder surgery 60 days ago. Context:  after shoulder surgery, withdrawals from opiates.  Past Psychiatric History: Dx: Suicidal ideation in Shawna past Psychiatrist: Dr. Collie Siad at [facility] in [place], [place] Therapist: Denies Hospitalizations: [facility] and [facility], Shawna ECT: Denies Suicide attempt/Self-harm: 2 in Shawna past Homicide attempts/harming others: Denies Previous Medication Trials: Unknown  Past Medical History:  Past Medical History  Diagnosis Date  . Sciatica of right side   . Bulging lumbar disc   . Insomnia   . Sciatic leg pain     Right side  . Weakness     Bil knees, and ankles  . Bulging lumbar disc     Past Surgical History  Procedure Laterality Date  . Tubal ligation    . Abdominal hysterectomy      Partial  . Tonsillectomy    . Shoulder surgery Right    Family History:  Family History  Problem Relation Age of Onset  . Fibromyalgia Shawna [name]    Social History:  History  Alcohol Use No     History  Drug Use No    Social History   Social History  . Marital Status: Single    Spouse Name: N/A  . Number of Children: N/A  . Years of Education: N/A   Social History Main Topics  . Smoking status: Current Every Day Smoker -- 1.00 packs/day for 40 years    Types: Cigarettes  . Smokeless tobacco: Never Used  . Alcohol Use: No  . Drug Use: No  . Sexual Activity: No   Other Topics Concern  . Not on file   Social History Narrative   Additional Social History:    Pain Medications: None Reported Prescriptions: None Reported Over Shawna Counter: None Reported History of alcohol / drug use?: No history of alcohol / drug abuse Longest period of sobriety (when/how long): N/A (N/A)   Allergies:   Allergies  Allergen Reactions  . Morphine And Related Nausea And Vomiting    Labs:  Results for  orders placed or performed during Shawna hospital encounter of 12/30/14 (from Shawna past 48 hour(s))  Comprehensive metabolic panel     Status: Abnormal   Collection Time: 12/30/14  8:34 PM  Result Value Ref Range   Sodium 140 135 - 145 mmol/L   Potassium 3.6 3.5 - 5.1 mmol/L   Chloride 108 101 - 111 mmol/L   CO2 22 22 - 32 mmol/L   Glucose, Bld 124 (H) 65 - 99 mg/dL   BUN 17 6 - 20 mg/dL   Creatinine, Ser 0.78 0.44 - 1.00 mg/dL   Calcium 9.1 8.9 - 10.3 mg/dL   Total Protein 7.1 6.5 - 8.1 g/dL   Albumin 4.2 3.5 - 5.0 g/dL   AST 20 15 - 41 U/L   ALT 11 (L) 14 - 54 U/L   Alkaline Phosphatase 72 38 - 126 U/L   Total Bilirubin 0.7 0.3 - 1.2 mg/dL   GFR  calc non Af Amer >60 >60 mL/min   GFR calc Af Amer >60 >60 mL/min    Comment: (NOTE) Shawna eGFR has been calculated using Shawna CKD EPI equation. This calculation has not been validated in all clinical situations. eGFR's persistently <60 mL/min signify possible Chronic Kidney Disease.    Anion gap 10 5 - 15  Ethanol (ETOH)     Status: None   Collection Time: 12/30/14  8:34 PM  Result Value Ref Range   Alcohol, Ethyl (B) <5 <5 mg/dL    Comment:        LOWEST DETECTABLE LIMIT FOR SERUM ALCOHOL IS 5 mg/dL FOR MEDICAL PURPOSES ONLY   Salicylate level     Status: None   Collection Time: 12/30/14  8:34 PM  Result Value Ref Range   Salicylate Lvl <0.9 2.8 - 30.0 mg/dL  Acetaminophen level     Status: Abnormal   Collection Time: 12/30/14  8:34 PM  Result Value Ref Range   Acetaminophen (Tylenol), Serum <10 (L) 10 - 30 ug/mL    Comment:        THERAPEUTIC CONCENTRATIONS VARY SIGNIFICANTLY. A RANGE OF 10-30 ug/mL MAY BE AN EFFECTIVE CONCENTRATION FOR MANY PATIENTS. HOWEVER, SOME ARE BEST TREATED AT CONCENTRATIONS OUTSIDE THIS RANGE. ACETAMINOPHEN CONCENTRATIONS >150 ug/mL AT 4 HOURS AFTER INGESTION AND >50 ug/mL AT 12 HOURS AFTER INGESTION ARE OFTEN ASSOCIATED WITH TOXIC REACTIONS.   CBC     Status: Abnormal   Collection Time:  12/30/14  8:34 PM  Result Value Ref Range   WBC 11.8 (H) 3.6 - 11.0 K/uL   RBC 4.65 3.80 - 5.20 MIL/uL   Hemoglobin 13.4 12.0 - 16.0 g/dL   HCT 39.9 35.0 - 47.0 %   MCV 85.8 80.0 - 100.0 fL   MCH 28.9 26.0 - 34.0 pg   MCHC 33.6 32.0 - 36.0 g/dL   RDW 16.1 (H) 11.5 - 14.5 %   Platelets 389 150 - 440 K/uL    Vitals: Blood pressure 114/75, pulse 103, temperature 98.7 F (37.1 C), temperature source Oral, resp. rate 20, height 5' 6"  (1.676 m), weight 70.761 kg (156 lb), SpO2 96 %.  Risk to Self: Suicidal Ideation: No Suicidal Intent: No Is patient at risk for suicide?: No Suicidal Plan?: No Access to Means: No What has been your use of drugs/alcohol within Shawna last 12 months?: None Reported How many times?: 4 Other Self Harm Risks: None Reported Triggers for Past Attempts: Unknown Intentional Self Injurious Behavior: None Risk to Others: Homicidal Ideation: No Thoughts of Harm to Others: No Current Homicidal Intent: No Current Homicidal Plan: No Access to Homicidal Means: No Identified Victim: N/A History of harm to others?: No Assessment of Violence: None Noted Violent Behavior Description: N/A Does patient have access to weapons?: No Criminal Charges Pending?: No Does patient have a court date: No Prior Inpatient Therapy: Prior Inpatient Therapy: Yes Prior Therapy Dates: 2016, 2015, 2013, 2008 or 2009 Prior Therapy Facilty/Provider(s): [place], [place] Reason for Treatment: Suicide Attempt Prior Outpatient Therapy: Prior Outpatient Therapy: Yes (Current Psych. Provider - Dr. Collie Siad) Prior Therapy Dates: "for years" - current provider Prior Therapy Facilty/Provider(s): [facility] Reason for Treatment: Depression, Anxiety Does patient have an ACCT team?: No Does patient have Intensive In-House Services?  : No Does patient have [facility] services? : No Does patient have P4CC services?: No  Current Facility-Administered Medications  Medication  Dose Route Frequency Provider Last Rate Last Dose  . clonazePAM (KLONOPIN) tablet 1  mg  1 mg Oral BID Loney Hering, MD   1 mg at 12/30/14 2347  . cloNIDine (CATAPRES) tablet 0.1 mg  0.1 mg Oral BID Loney Hering, MD   0.1 mg at 12/30/14 2330  . ibuprofen (ADVIL,MOTRIN) tablet 800 mg  800 mg Oral TID PRN Loney Hering, MD   800 mg at 12/30/14 2346  . pantoprazole (PROTONIX) EC tablet 40 mg  40 mg Oral QHS Loney Hering, MD   40 mg at 12/30/14 2356   Current Outpatient Prescriptions  Medication Sig Dispense Refill  . clonazePAM (KLONOPIN) 1 MG tablet Take 1 mg by mouth 2 (two) times daily.    . cloNIDine (CATAPRES) 0.1 MG tablet Take 1 tablet (0.1 mg total) by mouth 2 (two) times daily. 10 tablet 11  . hydrOXYzine (ATARAX/VISTARIL) 25 MG tablet Take 25 mg by mouth at bedtime as needed.    Marland Kitchen ibuprofen (ADVIL,MOTRIN) 800 MG tablet Take 800 mg by mouth every 8 (eight) hours as needed for mild pain.    . promethazine (PHENERGAN) 12.5 MG tablet Take 1 tablet (12.5 mg total) by mouth every 6 (six) hours as needed for nausea or vomiting. 15 tablet 0  . zolpidem (AMBIEN) 10 MG tablet Take 10 mg by mouth at bedtime.    Marland Kitchen HYDROcodone-acetaminophen (NORCO/VICODIN) 5-325 MG per tablet Take 1 tablet by mouth every 6 (six) hours as needed for moderate pain.    Marland Kitchen omeprazole (PRILOSEC) 40 MG capsule Take 40 mg by mouth daily. QHS    . traMADol (ULTRAM) 50 MG tablet Take 2 tablets (100 mg total) by mouth every 6 (six) hours as needed for moderate pain. 30 tablet 0    Musculoskeletal: Strength & Muscle Tone: within normal limits Gait & Station: not assessed Patient leans: N/A  Psychiatric Specialty Exam: Physical Exam  Review of Systems  Constitutional: Negative.   HENT: Negative.   Eyes: Negative.   Respiratory: Negative.   Cardiovascular: Negative.   Gastrointestinal: Negative.   Genitourinary: Negative.   Musculoskeletal: Negative.   Skin: Negative.   Neurological: Negative.    Endo/Heme/Allergies: Negative.   Psychiatric/Behavioral: Positive for substance abuse. Negative for depression, suicidal ideas, hallucinations and memory loss. Shawna patient has insomnia. Shawna patient is not nervous/anxious.     Blood pressure 114/75, pulse 103, temperature 98.7 F (37.1 C), temperature source Oral, resp. rate 20, height 5' 6"  (1.676 m), weight 70.761 kg (156 lb), SpO2 96 %.Body mass index is 25.19 kg/(m^2).  General Appearance: Casual and Neat  Eye Contact::  Good  Speech:  Clear and Coherent and Normal Rate  Volume:  Normal  Mood:  "I feel better today."   Affect:  Full Range, tearful at times  Thought Process:  Coherent, Goal Directed, Intact, Linear and Logical  Orientation:  Full (Time, Place, and Person)  Thought Content:  Negative  Suicidal Thoughts:  No  Homicidal Thoughts:  No  Memory:  Immediate;   Good Recent;   Good Remote;   Good  Judgement:  Fair  Insight:  Fair  Psychomotor Activity:  Normal  Concentration:  Good  Recall:  Good  Fund of Knowledge:Good  Language: Good  Akathisia:  Negative  Handed:  Left  AIMS (if indicated):     Assets:  Communication Skills Desire for Improvement Housing Leisure Time Resilience Social Support Talents/Skills  ADL's:  Intact  Cognition: WNL  Sleep:      Medical Decision Making: Established Problem, Stable/Improving (1), Review of Psycho-Social Stressors (1) and  Review or order clinical lab tests (1)  Treatment Plan Summary: Plan Shawna [name] is not in need of psychiatric hospitalization at this time.   2. Follow-up with Dr. Collie Siad (psychiatry) at [facility] in [place], [place]  3. Patient may use melatonin 84m po QHS for insomnia.   Plan:  No evidence of imminent risk to self or others at present.   Patient does not meet criteria for psychiatric inpatient admission. Supportive therapy provided about ongoing stressors. Discussed crisis plan, support from social network, calling 911, coming to Shawna  Emergency Department, and calling Suicide Hotline. Disposition: Home  MDonita Brooks9/08/2014 8:14 AM

## 2014-12-31 NOTE — ED Notes (Signed)
Pt resting in bed; calm and cooperative; eating sandwich tray

## 2014-12-31 NOTE — ED Notes (Signed)
BEHAVIORAL HEALTH ROUNDING Patient sleeping: Yes.   Patient alert and oriented: pt sleeping Behavior appropriate: Yes.  ; If no, describe: Nutrition and fluids offered: Yes  Toileting and hygiene offered: Yes  Sitter present: no Law enforcement present: Yes  

## 2014-12-31 NOTE — ED Notes (Signed)
Verbal report given to Danelle Earthly, RN in [place]; to move pt now to that area

## 2014-12-31 NOTE — ED Provider Notes (Addendum)
-----------------------------------------   7:27 AM on 12/31/2014 -----------------------------------------   Blood pressure 114/75, pulse 103, temperature 98.7 F (37.1 C), temperature source Oral, resp. rate 20, height  (1.676 m), weight 156 lb (70.761 kg), SpO2 96 %.  The patient had no acute events since last update.  Calm and cooperative at this time.  Disposition is pending per Psychiatry/Behavioral Medicine team recommendations.   Patient was seen by psychiatry and felt to be clear for discharge. She was referred to [facility].   Jennye Moccasin, MD 12/31/14 4540  Jennye Moccasin, MD 12/31/14 [phone]

## 2014-12-31 NOTE — ED Notes (Signed)
Pt given extra blankets d/t c/o shoulder pain and bed being hard

## 2014-12-31 NOTE — ED Notes (Signed)
Pt requesting a nicotine patch and loratadine for allergies.  MD notified.Marland Kitchen

## 2014-12-31 NOTE — ED Notes (Signed)
BEHAVIORAL HEALTH ROUNDING Patient sleeping: No. Patient alert and oriented: yes Behavior appropriate: Yes.  ; If no, describe:   Nutrition and fluids offered: Yes  Toileting and hygiene offered: Yes  Sitter present: no Law enforcement present: Yes  and ODS  Patient assigned to appropriate care area. Patient oriented to unit/care area: Informed that, for their safety, care areas are designed for safety and monitored by security cameras at all times; and visiting hours explained to patient. Patient verbalizes understanding, and verbal contract for safety obtained.  ED BHU PLACEMENT JUSTIFICATION Is the patient under IVC or is there intent for IVC: Yes.   Is the patient medically cleared: Yes.   Is there vacancy in the ED BHU: Yes.   Is the population mix appropriate for patient: Yes.   Is the patient awaiting placement in inpatient or outpatient setting: Yes.   Has the patient had a psychiatric consult: No. Survey of unit performed for contraband, proper placement and condition of furniture, tampering with fixtures in bathroom, shower, and each patient room: Yes.  ; Findings: all clear APPEARANCE/BEHAVIOR calm and cooperative NEURO ASSESSMENT Orientation: time, place and person Hallucinations: No.None noted (Hallucinations) Speech: Normal Gait: normal RESPIRATORY ASSESSMENT Normal expansion.  Clear to auscultation.  No rales, rhonchi, or wheezing. CARDIOVASCULAR ASSESSMENT regular rate and rhythm, S1, S2 normal, no murmur, click, rub or gallop GASTROINTESTINAL ASSESSMENT soft, nontender, BS WNL, no r/g EXTREMITIES normal strength, tone, and muscle mass PLAN OF CARE Provide calm/safe environment. Vital signs assessed twice daily. ED BHU Assessment once each 12-hour shift. Collaborate with intake RN daily or as condition indicates. Assure the ED provider has rounded once each shift. Provide and encourage hygiene. Provide redirection as needed. Assess for escalating behavior; address  immediately and inform ED provider.  Assess family dynamic and appropriateness for visitation as needed: No.; If necessary, describe findings:   Educate the patient/family about BHU procedures/visitation: No.; If necessary, describe findings:    ENVIRONMENTAL ASSESSMENT Potentially harmful objects out of patient reach: Yes.   Personal belongings secured: Yes.   Patient dressed in hospital provided attire only: Yes.   Plastic bags out of patient reach: Yes.   Patient care equipment (cords, cables, call bells, lines, and drains) shortened, removed, or accounted for: Yes.   Equipment and supplies removed from bottom of stretcher: Yes.   Potentially toxic materials out of patient reach: Yes.   Sharps container removed or out of patient reach: Yes.   

## 2014-12-31 NOTE — ED Notes (Signed)
BEHAVIORAL HEALTH ROUNDING Patient sleeping: No. Patient alert and oriented: yes Behavior appropriate: Yes.  ; If no, describe: pt c/o pain in shoulder and insufficient padding on bed, pt talking to room 7 Nutrition and fluids offered: Yes  Toileting and hygiene offered: Yes  Sitter present: no Law enforcement present: Yes  and ODS

## 2014-12-31 NOTE — ED Notes (Signed)
Pt is ready for discharge.the patient is waiting for her daughter to pick her up, states she had to do a couple things before she was able to get here.

## 2015-01-23 ENCOUNTER — Emergency Department: Payer: Medicaid Other

## 2015-01-23 ENCOUNTER — Encounter: Payer: Self-pay | Admitting: *Deleted

## 2015-01-23 ENCOUNTER — Inpatient Hospital Stay
Admission: EM | Admit: 2015-01-23 | Discharge: 2015-01-24 | DRG: 871 | Discharge status: Against Medical Advice | Payer: Medicaid Other | Attending: Internal Medicine | Admitting: Internal Medicine

## 2015-01-23 ENCOUNTER — Inpatient Hospital Stay: Payer: Medicaid Other

## 2015-01-23 DIAGNOSIS — F17210 Nicotine dependence, cigarettes, uncomplicated: Secondary | ICD-10-CM | POA: Diagnosis present

## 2015-01-23 DIAGNOSIS — Z886 Allergy status to analgesic agent status: Secondary | ICD-10-CM | POA: Diagnosis not present

## 2015-01-23 DIAGNOSIS — J441 Chronic obstructive pulmonary disease with (acute) exacerbation: Secondary | ICD-10-CM | POA: Diagnosis present

## 2015-01-23 DIAGNOSIS — R0902 Hypoxemia: Secondary | ICD-10-CM

## 2015-01-23 DIAGNOSIS — A419 Sepsis, unspecified organism: Secondary | ICD-10-CM | POA: Diagnosis not present

## 2015-01-23 DIAGNOSIS — J440 Chronic obstructive pulmonary disease with acute lower respiratory infection: Secondary | ICD-10-CM | POA: Diagnosis present

## 2015-01-23 DIAGNOSIS — J219 Acute bronchiolitis, unspecified: Secondary | ICD-10-CM | POA: Diagnosis present

## 2015-01-23 DIAGNOSIS — Z888 Allergy status to other drugs, medicaments and biological substances status: Secondary | ICD-10-CM

## 2015-01-23 DIAGNOSIS — Z79891 Long term (current) use of opiate analgesic: Secondary | ICD-10-CM

## 2015-01-23 DIAGNOSIS — Z885 Allergy status to narcotic agent status: Secondary | ICD-10-CM | POA: Diagnosis not present

## 2015-01-23 DIAGNOSIS — M549 Dorsalgia, unspecified: Secondary | ICD-10-CM | POA: Diagnosis present

## 2015-01-23 DIAGNOSIS — G8929 Other chronic pain: Secondary | ICD-10-CM | POA: Diagnosis present

## 2015-01-23 DIAGNOSIS — J45909 Unspecified asthma, uncomplicated: Secondary | ICD-10-CM | POA: Diagnosis present

## 2015-01-23 DIAGNOSIS — D72829 Elevated white blood cell count, unspecified: Secondary | ICD-10-CM

## 2015-01-23 DIAGNOSIS — J159 Unspecified bacterial pneumonia: Secondary | ICD-10-CM | POA: Insufficient documentation

## 2015-01-23 DIAGNOSIS — J9601 Acute respiratory failure with hypoxia: Secondary | ICD-10-CM | POA: Diagnosis present

## 2015-01-23 DIAGNOSIS — Z79899 Other long term (current) drug therapy: Secondary | ICD-10-CM | POA: Diagnosis not present

## 2015-01-23 HISTORY — DX: Unspecified asthma, uncomplicated: J45.909

## 2015-01-23 LAB — URINE DRUG SCREEN, QUALITATIVE (ARMC ONLY)
Amphetamines, Ur Screen: NOT DETECTED
BARBITURATES, UR SCREEN: NOT DETECTED
BENZODIAZEPINE, UR SCRN: POSITIVE — AB
Cannabinoid 50 Ng, Ur ~~LOC~~: NOT DETECTED
Cocaine Metabolite,Ur ~~LOC~~: NOT DETECTED
MDMA (Ecstasy)Ur Screen: NOT DETECTED
METHADONE SCREEN, URINE: NOT DETECTED
OPIATE, UR SCREEN: NOT DETECTED
PHENCYCLIDINE (PCP) UR S: NOT DETECTED
Tricyclic, Ur Screen: POSITIVE — AB

## 2015-01-23 LAB — CBC WITH DIFFERENTIAL/PLATELET
BASOS PCT: 1 %
Basophils Absolute: 0.1 10*3/uL (ref 0–0.1)
EOS ABS: 0.1 10*3/uL (ref 0–0.7)
Eosinophils Relative: 0 %
HEMATOCRIT: 38.8 % (ref 35.0–47.0)
HEMOGLOBIN: 13.1 g/dL (ref 12.0–16.0)
LYMPHS ABS: 3.3 10*3/uL (ref 1.0–3.6)
Lymphocytes Relative: 18 %
MCH: 29.3 pg (ref 26.0–34.0)
MCHC: 33.9 g/dL (ref 32.0–36.0)
MCV: 86.5 fL (ref 80.0–100.0)
MONO ABS: 0.8 10*3/uL (ref 0.2–0.9)
MONOS PCT: 5 %
Neutro Abs: 14.4 10*3/uL — ABNORMAL HIGH (ref 1.4–6.5)
Neutrophils Relative %: 76 %
Platelets: 426 10*3/uL (ref 150–440)
RBC: 4.49 MIL/uL (ref 3.80–5.20)
RDW: 16.6 % — AB (ref 11.5–14.5)
WBC: 18.8 10*3/uL — ABNORMAL HIGH (ref 3.6–11.0)

## 2015-01-23 LAB — CREATININE, SERUM
CREATININE: 0.93 mg/dL (ref 0.44–1.00)
GFR calc Af Amer: >60 mL/min (ref >60)

## 2015-01-23 LAB — URINALYSIS COMPLETE WITH MICROSCOPIC (ARMC ONLY)
BACTERIA UA: NONE SEEN
Bilirubin Urine: NEGATIVE
GLUCOSE, UA: NEGATIVE mg/dL
Hgb urine dipstick: NEGATIVE
Leukocytes, UA: NEGATIVE
NITRITE: NEGATIVE
Protein, ur: NEGATIVE mg/dL
Specific Gravity, Urine: >1.06 — ABNORMAL HIGH (ref 1.005–1.030)
pH: 6 (ref 5.0–8.0)

## 2015-01-23 LAB — BASIC METABOLIC PANEL
Anion gap: 12 (ref 5–15)
BUN: 12 mg/dL (ref 6–20)
CALCIUM: 9.5 mg/dL (ref 8.9–10.3)
CHLORIDE: 104 mmol/L (ref 101–111)
CO2: 22 mmol/L (ref 22–32)
CREATININE: 0.73 mg/dL (ref 0.44–1.00)
GFR calc non Af Amer: >60 mL/min (ref >60)
Glucose, Bld: 114 mg/dL — ABNORMAL HIGH (ref 65–99)
Potassium: 3.7 mmol/L (ref 3.5–5.1)
Sodium: 138 mmol/L (ref 135–145)

## 2015-01-23 LAB — CBC
HCT: 33.8 % — ABNORMAL LOW (ref 35.0–47.0)
HEMOGLOBIN: 11.1 g/dL — AB (ref 12.0–16.0)
MCH: 28.4 pg (ref 26.0–34.0)
MCHC: 32.9 g/dL (ref 32.0–36.0)
MCV: 86.5 fL (ref 80.0–100.0)
Platelets: 324 10*3/uL (ref 150–440)
RBC: 3.92 MIL/uL (ref 3.80–5.20)
RDW: 16.8 % — AB (ref 11.5–14.5)
WBC: 13.7 10*3/uL — AB (ref 3.6–11.0)

## 2015-01-23 LAB — LACTIC ACID, PLASMA
LACTIC ACID, VENOUS: 0.8 mmol/L (ref 0.5–2.0)
Lactic Acid, Venous: 3.3 mmol/L (ref 0.5–2.0)

## 2015-01-23 MED ORDER — POTASSIUM CHLORIDE IN NACL 20-0.9 MEQ/L-% IV SOLN
INTRAVENOUS | Status: DC
  Administered 2015-01-24 (×2): via INTRAVENOUS
  Filled 2015-01-23 (×5): qty 1000

## 2015-01-23 MED ORDER — LEVOFLOXACIN IN D5W 750 MG/150ML IV SOLN
750.0000 mg | INTRAVENOUS | Status: DC
  Administered 2015-01-24: 750 mg via INTRAVENOUS
  Filled 2015-01-23: qty 150

## 2015-01-23 MED ORDER — PANTOPRAZOLE SODIUM 40 MG PO TBEC
40.0000 mg | DELAYED_RELEASE_TABLET | Freq: Every day | ORAL | Status: DC
  Administered 2015-01-23: 40 mg via ORAL
  Filled 2015-01-23: qty 1

## 2015-01-23 MED ORDER — ONDANSETRON HCL 4 MG/2ML IJ SOLN: 4.0000 mg | Freq: Four times a day (QID) | INTRAMUSCULAR | Status: DC | PRN

## 2015-01-23 MED ORDER — METHYLPREDNISOLONE SODIUM SUCC 125 MG IJ SOLR
125.0000 mg | INTRAMUSCULAR | Status: AC
  Administered 2015-01-23: 125 mg via INTRAVENOUS
  Filled 2015-01-23: qty 2

## 2015-01-23 MED ORDER — TRAMADOL HCL 50 MG PO TABS
100.0000 mg | ORAL_TABLET | Freq: Four times a day (QID) | ORAL | Status: DC | PRN
  Administered 2015-01-24: 100 mg via ORAL
  Filled 2015-01-23: qty 2

## 2015-01-23 MED ORDER — IPRATROPIUM-ALBUTEROL 0.5-2.5 (3) MG/3ML IN SOLN
3.0000 mL | RESPIRATORY_TRACT | Status: DC
  Administered 2015-01-23 – 2015-01-24 (×5): 3 mL via RESPIRATORY_TRACT
  Filled 2015-01-23 (×7): qty 3

## 2015-01-23 MED ORDER — ONDANSETRON 4 MG PO TBDP
4.0000 mg | ORAL_TABLET | Freq: Once | ORAL | Status: AC
Start: 2015-01-23 — End: 2015-01-23
  Administered 2015-01-23: 4 mg via ORAL

## 2015-01-23 MED ORDER — NICOTINE 21 MG/24HR TD PT24
21.0000 mg | MEDICATED_PATCH | Freq: Every day | TRANSDERMAL | Status: DC
  Administered 2015-01-23 – 2015-01-24 (×2): 21 mg via TRANSDERMAL
  Filled 2015-01-23 (×2): qty 1

## 2015-01-23 MED ORDER — ACETAMINOPHEN 325 MG PO TABS
650.0000 mg | ORAL_TABLET | Freq: Four times a day (QID) | ORAL | Status: DC | PRN
  Administered 2015-01-24 (×3): 650 mg via ORAL
  Filled 2015-01-23 (×3): qty 2

## 2015-01-23 MED ORDER — SODIUM CHLORIDE 0.9 % IV BOLUS (SEPSIS)
1000.0000 mL | Freq: Once | INTRAVENOUS | Status: AC
  Administered 2015-01-23: 1000 mL via INTRAVENOUS

## 2015-01-23 MED ORDER — ENOXAPARIN SODIUM 40 MG/0.4ML ~~LOC~~ SOLN
40.0000 mg | SUBCUTANEOUS | Status: DC
  Administered 2015-01-23 – 2015-01-24 (×2): 40 mg via SUBCUTANEOUS
  Filled 2015-01-23 (×2): qty 0.4

## 2015-01-23 MED ORDER — HYDROXYZINE HCL 50 MG PO TABS
25.0000 mg | ORAL_TABLET | Freq: Four times a day (QID) | ORAL | Status: DC | PRN
  Administered 2015-01-24: 25 mg via ORAL
  Filled 2015-01-23 (×2): qty 1

## 2015-01-23 MED ORDER — NICOTINE 10 MG IN INHA
1.0000 | RESPIRATORY_TRACT | Status: DC | PRN
  Administered 2015-01-23: 1 via RESPIRATORY_TRACT
  Filled 2015-01-23: qty 36

## 2015-01-23 MED ORDER — ACETAMINOPHEN 500 MG PO TABS
1000.0000 mg | ORAL_TABLET | ORAL | Status: AC
  Administered 2015-01-23: 1000 mg via ORAL
  Filled 2015-01-23: qty 2

## 2015-01-23 MED ORDER — CLONAZEPAM 1 MG PO TABS
1.0000 mg | ORAL_TABLET | Freq: Two times a day (BID) | ORAL | Status: DC
  Administered 2015-01-23 – 2015-01-24 (×2): 1 mg via ORAL
  Filled 2015-01-23 (×2): qty 1

## 2015-01-23 MED ORDER — IPRATROPIUM-ALBUTEROL 0.5-2.5 (3) MG/3ML IN SOLN
3.0000 mL | Freq: Once | RESPIRATORY_TRACT | Status: AC
  Administered 2015-01-23: 3 mL via RESPIRATORY_TRACT
  Filled 2015-01-23: qty 3

## 2015-01-23 MED ORDER — IOHEXOL 350 MG/ML SOLN
100.0000 mL | Freq: Once | INTRAVENOUS | Status: AC | PRN
  Administered 2015-01-23: 100 mL via INTRAVENOUS

## 2015-01-23 MED ORDER — SODIUM CHLORIDE 0.9 % IJ SOLN
3.0000 mL | Freq: Two times a day (BID) | INTRAMUSCULAR | Status: DC
  Administered 2015-01-23: 3 mL via INTRAVENOUS

## 2015-01-23 MED ORDER — LEVOFLOXACIN IN D5W 750 MG/150ML IV SOLN
750.0000 mg | Freq: Once | INTRAVENOUS | Status: AC
  Administered 2015-01-23: 750 mg via INTRAVENOUS
  Filled 2015-01-23: qty 150

## 2015-01-23 MED ORDER — ONDANSETRON HCL 4 MG/2ML IJ SOLN
4.0000 mg | INTRAMUSCULAR | Status: AC
  Filled 2015-01-23: qty 2

## 2015-01-23 MED ORDER — BUDESONIDE-FORMOTEROL FUMARATE 160-4.5 MCG/ACT IN AERO
2.0000 | INHALATION_SPRAY | Freq: Two times a day (BID) | RESPIRATORY_TRACT | Status: DC
  Administered 2015-01-23 – 2015-01-24 (×3): 2 via RESPIRATORY_TRACT
  Filled 2015-01-23: qty 6

## 2015-01-23 MED ORDER — ONDANSETRON 4 MG PO TBDP
ORAL_TABLET | ORAL | Status: AC
  Administered 2015-01-23: 4 mg via ORAL
  Filled 2015-01-23: qty 1

## 2015-01-23 MED ORDER — ONDANSETRON HCL 4 MG PO TABS: 4.0000 mg | ORAL_TABLET | Freq: Four times a day (QID) | ORAL | Status: DC | PRN

## 2015-01-23 MED ORDER — ZOLPIDEM TARTRATE 5 MG PO TABS
5.0000 mg | ORAL_TABLET | Freq: Every day | ORAL | Status: DC
  Administered 2015-01-23: 5 mg via ORAL
  Filled 2015-01-23: qty 1

## 2015-01-23 MED ORDER — ALBUTEROL SULFATE (2.5 MG/3ML) 0.083% IN NEBU: 2.5000 mg | INHALATION_SOLUTION | Freq: Four times a day (QID) | RESPIRATORY_TRACT | Status: DC

## 2015-01-23 MED ORDER — PREDNISONE 50 MG PO TABS
50.0000 mg | ORAL_TABLET | Freq: Every day | ORAL | Status: DC
  Administered 2015-01-24: 50 mg via ORAL
  Filled 2015-01-23: qty 1

## 2015-01-23 MED ORDER — DOCUSATE SODIUM 100 MG PO CAPS
100.0000 mg | ORAL_CAPSULE | Freq: Two times a day (BID) | ORAL | Status: DC
  Filled 2015-01-23 (×2): qty 1

## 2015-01-23 MED ORDER — ACETAMINOPHEN 650 MG RE SUPP: 650.0000 mg | Freq: Four times a day (QID) | RECTAL | Status: DC | PRN

## 2015-01-23 NOTE — H&P (Signed)
[facility] - [place] at [facility]   PATIENT NAME: Shawna [name]    MR#:  [id number]  DATE OF BIRTH:  1965/08/16  DATE OF ADMISSION:  01/23/2015  PRIMARY CARE PHYSICIAN: Luna Fuse, MD   REQUESTING/REFERRING PHYSICIAN:   CHIEF COMPLAINT:   Chief Complaint  Patient presents with  . Cough    HISTORY OF PRESENT ILLNESS: Shawna [name]  is a 49 y.o. female with a known history of back pain who presents to the hospital with complaints of fevers, chills, cough for the past for 5 days and feeling fatigued. In emergency room, she was noted to be hypotensive as well as tachycardic and hypoxic with O2 sats of 91-93%. CT scan of chest showed bilateral basilar pneumonia, but no pulmonary embolism. She is white blood cell count is elevated at more than 18,000. Hospitalist services where contacted for admission.   PAST MEDICAL HISTORY:   Past Medical History  Diagnosis Date  . Sciatica of right side   . Bulging lumbar disc   . Insomnia   . Sciatic leg pain     Right side  . Weakness     Bil knees, and ankles  . Bulging lumbar disc   . Asthma     PAST SURGICAL HISTORY:  Past Surgical History  Procedure Laterality Date  . Tubal ligation    . Abdominal hysterectomy      Partial  . Tonsillectomy    . Shoulder surgery Right     SOCIAL HISTORY:  Social History  Substance Use Topics  . Smoking status: Current Every Day Smoker -- 1.00 packs/day for 40 years    Types: Cigarettes  . Smokeless tobacco: Never Used  . Alcohol Use: No    FAMILY HISTORY:  Family History  Problem Relation Age of Onset  . Fibromyalgia Mother     DRUG ALLERGIES:  Allergies  Allergen Reactions  . Aspirin Other (See Comments)    Reaction:  GI upset   . Morphine And Related Nausea And Vomiting  . Nsaids Other (See Comments)    Reaction:  GI upset   . Vicodin [Hydrocodone-Acetaminophen] Nausea And Vomiting    Review of Systems  Constitutional: Positive for fever,  chills and weight loss. Negative for malaise/fatigue.  HENT: Negative for congestion.   Eyes: Positive for blurred vision. Negative for double vision.  Respiratory: Positive for cough, sputum production, shortness of breath and wheezing.   Cardiovascular: Positive for chest pain. Negative for palpitations, orthopnea, leg swelling and PND.  Gastrointestinal: Positive for nausea. Negative for vomiting, abdominal pain, diarrhea, constipation, blood in stool and melena.  Genitourinary: Positive for dysuria. Negative for urgency, frequency and hematuria.  Musculoskeletal: Negative for falls.  Skin: Negative for rash.  Neurological: Negative for dizziness and weakness.  Psychiatric/Behavioral: Negative for depression and memory loss. The patient is not nervous/anxious.     MEDICATIONS AT HOME:  Prior to Admission medications   Medication Sig Start Date End Date Taking? Authorizing Khadeja Abt  albuterol (PROVENTIL HFA;VENTOLIN HFA) 108 (90 BASE) MCG/ACT inhaler Inhale 2 puffs into the lungs every 6 (six) hours as needed for wheezing or shortness of breath.   Yes Historical Erinn Mendosa, MD  cetirizine (ZYRTEC) 10 MG tablet Take 10 mg by mouth daily.   Yes Historical Mavrik Bynum, MD  clonazePAM (KLONOPIN) 1 MG tablet Take 1-2 mg by mouth 2 (two) times daily. Pt takes one tablet in the morning and two at bedtime.   Yes Historical Karl Knarr, MD  hydrOXYzine (ATARAX/VISTARIL) 50  MG tablet Take 50-100 mg by mouth 5 (five) times daily. Pt takes one tablet every 6 hours and two tablets at bedtime.   Yes Historical Liberato Stansbery, MD  ibuprofen (ADVIL,MOTRIN) 200 MG tablet Take 400 mg by mouth 3 (three) times daily as needed for mild pain.   Yes Historical Allah Reason, MD  omeprazole (PRILOSEC) 40 MG capsule Take 40 mg by mouth at bedtime.    Yes Historical Masae Lukacs, MD  zolpidem (AMBIEN) 10 MG tablet Take 10 mg by mouth at bedtime.   Yes Historical Alexismarie Flaim, MD      PHYSICAL EXAMINATION:   VITAL SIGNS: Blood pressure  110/72, pulse 117, temperature 99.5 F (37.5 C), temperature source Oral, resp. rate 12, height  (1.676 m), weight 68.04 kg (150 lb), SpO2 93 %.  GENERAL:  49 y.o.-year-old patient lying in the bed with no acute distress. Dry cough intermittently EYES: Pupils equal, round, reactive to light and accommodation. No scleral icterus. Extraocular muscles intact.  HEENT: Head atraumatic, normocephalic. Oropharynx and nasopharynx clear.  NECK:  Supple, no jugular venous distention. No thyroid enlargement, no tenderness.  LUNGS: Somewhat diminished breath sounds bilaterally, but no wheezing, rales,rhonchi or crepitation at present. Apparently some areas on the rhonchi at bases on admission to emergency room per emergency room M.D. No use of accessory muscles of respiration.  CARDIOVASCULAR: S1, S2 normal, tachycardic. No murmurs, rubs, or gallops.  ABDOMEN: Soft, nontender, nondistended. Bowel sounds present. No organomegaly or mass.  EXTREMITIES: No pedal edema, cyanosis, or clubbing.  NEUROLOGIC: Cranial nerves II through XII are intact. Muscle strength 5/5 in all extremities. Sensation intact. Gait not checked.  PSYCHIATRIC: The patient is alert and oriented x 3.  SKIN: No obvious rash, lesion, or ulcer.   LABORATORY PANEL:   CBC  Recent Labs Lab 01/23/15 1638  WBC 18.8*  HGB 13.1  HCT 38.8  PLT 426  MCV 86.5  MCH 29.3  MCHC 33.9  RDW 16.6*  LYMPHSABS 3.3  MONOABS 0.8  EOSABS 0.1  BASOSABS 0.1   ------------------------------------------------------------------------------------------------------------------  Chemistries   Recent Labs Lab 01/23/15 1638  NA 138  K 3.7  CL 104  CO2 22  GLUCOSE 114*  BUN 12  CREATININE 0.73  CALCIUM 9.5   ------------------------------------------------------------------------------------------------------------------  Cardiac Enzymes No results for input(s): TROPONINI in the last 168  hours. ------------------------------------------------------------------------------------------------------------------  RADIOLOGY: Dg Chest 2 View  01/23/2015   CLINICAL DATA:  Cough and congestion, short of breath. Chest pain. Intermittent fever.  EXAM: CHEST  2 VIEW  COMPARISON:  Radiograph 12/21/2014  FINDINGS: Normal cardiac silhouette. No effusion, infiltrate, or pneumothorax. Lungs are hyperinflated. RIGHT shoulder arthroplasty.  IMPRESSION: Hyperinflated lungs without acute findings.   Electronically Signed   By: Genevive Bi M.D.   On: 01/23/2015 17:08   Ct Angio Chest Pe W/cm &/or Wo Cm  01/23/2015   CLINICAL DATA:  Hypoxemia. Productive cough, fever, chills and fatigue for 3 days.  EXAM: CT ANGIOGRAPHY CHEST WITH CONTRAST  TECHNIQUE: Multidetector CT imaging of the chest was performed using the standard protocol during bolus administration of intravenous contrast. Multiplanar CT image reconstructions and MIPs were obtained to evaluate the vascular anatomy.  CONTRAST:  OMNIPAQUE IOHEXOL 350 MG/ML SOLN  COMPARISON:  Chest radiograph earlier this day.  FINDINGS: There are no filling defects within the pulmonary arteries to suggest pulmonary embolus. Evaluation of the mid chest and subsegmental branches in the lower lobes is partially obscured by breathing motion artifact.  The thoracic aorta is normal in caliber. The  heart size is normal. Shotty mediastinal lymph nodes with AP window lymph node measuring 6 mm in short axis dimension. Shotty bilateral hilar lymph nodes. There is no pleural or pericardial effusion.  Moderate emphysema most significant at the lung apices and upper lobes. There is lower lobe bronchial thickening with subsegmental bronchial obstruction in the dependent lower lobes, left greater than right. Associated left greater than right basilar opacity. No confluent opacity in the upper or middle lobes. There is biapical pleural parenchymal scarring. No dominant pulmonary  mass allowing for limitations related to breathing artifact.  No acute abnormality in the included upper abdomen.  There are no acute or suspicious osseous abnormalities. Prominent Schmorl's node mid thoracic vertebra. Right proximal humeral prosthesis partially included.  Review of the MIP images confirms the above findings.  IMPRESSION: 1. Allowing for motion degraded exam, no pulmonary embolus. 2. Moderate emphysema. There is bronchial thickening. Subsegmental bronchial obstruction in both lower lobes may be related to mucoid impaction or aspiration. Endobronchial lesion is considered less likely given the location and bilaterality. Adjacent dependent bibasilar opacities likely sequela of bronchial obstruction, atelectasis or aspiration.   Electronically Signed   By: Rubye Oaks M.D.   On: 01/23/2015 19:33    EKG: Orders placed or performed during the hospital encounter of 01/23/15  . ED EKG  . ED EKG  . EKG 12-Lead  . EKG 12-Lead    IMPRESSION AND PLAN:  Active Problems:   Bacterial pneumonia   Leukocytosis   Sepsis   Hypoxemia 1. Sepsis due to bacterial pneumonia. Admit patient to medical floor. Blood cultures are taken. Initiate patient on levofloxacin IV. Follow culture results and adjust antibiotics depending on culture results 2. Bacterial pneumonia, get sputum cultures. Continue on levofloxacin, which was initiated on emergency room admission, follow culture results as above 3. COPD exacerbation, initiate patient on steroids, nebulizers, inhalers, follow clinically 4. Hypoxemia. Continue oxygen therapy per nasal cannulas as needed 5. Hypotension. Continue IV fluids 6. Leukocytosis. Follow with therapy 7. Tobacco abuse. Initiate patient on nicotine replacement therapy. Discussed this patient for approximately 4 to 5 minutes. She is agreeable       All the records are reviewed and case discussed with ED Marcos Peloso. Management plans discussed with the patient, family and  they are in agreement.  CODE STATUS:   full code   TOTAL TIME TAKING CARE OF THIS PATIENT: 50  minutes.    Katharina Caper M.D on 01/23/2015 at 7:58 PM  Between 7am to 6pm - Pager - [phone] After 6pm go to [email or web address] - password [email or web address] [facility]  [place] [facility]  Office  [phone]  CC: Primary care physician; Luna Fuse, MD

## 2015-01-23 NOTE — ED Notes (Signed)
Per EMS:  Pt from PCP.  States that she has had a cough, fever and tachycardia x 4 days.

## 2015-01-23 NOTE — ED Provider Notes (Signed)
[facility] Emergency Department Provider Note REMINDER - THIS NOTE IS NOT A FINAL MEDICAL RECORD UNTIL IT IS SIGNED. UNTIL THEN, THE CONTENT BELOW MAY REFLECT INFORMATION FROM A DOCUMENTATION TEMPLATE, NOT THE ACTUAL PATIENT VISIT. ____________________________________________  Time seen: Approximately 4:28 PM  I have reviewed the triage vital signs and the nursing notes.   HISTORY  Chief Complaint Cough    HPI Shawna [name] is a 49 y.o. female  history of back pain, recent evaluation by psychiatry. She reports last 3-4 days she's been having productive cough, chills, fever and generalized fatigue. She saw her primary care doctor today and was sent by EMS for concerns of "pneumonia". She do not have a chest x-ray done at the clinic, but does report fever to 100 at home, tachycardia.  No chest pain. No abdominal pain. She denies wheezing though she is a smoker. She does not have a known history of COPD per the patient.  She does report feeling short of breath, describes as mild to moderate and worse with exertion.   Past Medical History  Diagnosis Date  . Sciatica of right side   . Bulging lumbar disc   . Insomnia   . Sciatic leg pain     Right side  . Weakness     Bil knees, and ankles  . Bulging lumbar disc     Patient Active Problem List   Diagnosis Date Noted  . Bronchitis, acute 01/23/2015  . Leukocytosis 01/23/2015  . Sepsis 01/23/2015  . Hypoxemia 01/23/2015  . Opioid use disorder, moderate, dependence 12/31/2014  . Anxiety disorder, unspecified 12/31/2014    Past Surgical History  Procedure Laterality Date  . Tubal ligation    . Abdominal hysterectomy      Partial  . Tonsillectomy    . Shoulder surgery Right     Current Outpatient Rx  Name  Route  Sig  Dispense  Refill  . clonazePAM (KLONOPIN) 1 MG tablet   Oral   Take 1 mg by mouth 2 (two) times daily.         . cloNIDine (CATAPRES) 0.1 MG tablet   Oral   Take 1 tablet  (0.1 mg total) by mouth 2 (two) times daily.   10 tablet   11   . HYDROcodone-acetaminophen (NORCO/VICODIN) 5-325 MG per tablet   Oral   Take 1 tablet by mouth every 6 (six) hours as needed for moderate pain.         . hydrOXYzine (ATARAX/VISTARIL) 25 MG tablet   Oral   Take 25 mg by mouth at bedtime as needed.         Marland Kitchen ibuprofen (ADVIL,MOTRIN) 800 MG tablet   Oral   Take 800 mg by mouth every 8 (eight) hours as needed for mild pain.         Marland Kitchen omeprazole (PRILOSEC) 40 MG capsule   Oral   Take 40 mg by mouth daily. QHS         . promethazine (PHENERGAN) 12.5 MG tablet   Oral   Take 1 tablet (12.5 mg total) by mouth every 6 (six) hours as needed for nausea or vomiting.   15 tablet   0   . traMADol (ULTRAM) 50 MG tablet   Oral   Take 2 tablets (100 mg total) by mouth every 6 (six) hours as needed for moderate pain.   30 tablet   0   . zolpidem (AMBIEN) 10 MG tablet   Oral   Take  10 mg by mouth at bedtime.           Allergies Morphine and related  Family History  Problem Relation Age of Onset  . Fibromyalgia Mother     Social History Social History  Substance Use Topics  . Smoking status: Current Every Day Smoker -- 1.00 packs/day for 40 years    Types: Cigarettes  . Smokeless tobacco: Never Used  . Alcohol Use: No    Review of Systems Constitutional: See history of present illness Eyes: No visual changes. ENT: No sore throat. Cardiovascular: Denies chest pain. Respiratory: See history of present illness Gastrointestinal: No abdominal pain.  No nausea, no vomiting.  No diarrhea.  No constipation. Genitourinary: Negative for dysuria. Musculoskeletal: Negative for back pain. Skin: Negative for rash. Neurological: Negative for headaches, focal weakness or numbness.  10-point ROS otherwise negative.  ____________________________________________   PHYSICAL EXAM:  VITAL SIGNS: ED Triage Vitals  Enc Vitals Group     BP 01/23/15 1618 120/89  mmHg     Pulse Rate 01/23/15 1618 117     Resp 01/23/15 1618 20     Temp 01/23/15 1618 99.5 F (37.5 C)     Temp Source 01/23/15 1618 Oral     SpO2 01/23/15 1618 91 %     Weight 01/23/15 1618 150 lb (68.04 kg)     Height 01/23/15 1618  (1.676 m)     Head Cir --      Peak Flow --      Pain Score 01/23/15 1617 7     Pain Loc --      Pain Edu? --      Excl. in GC? --    Constitutional: Alert and oriented. Patient slowly diaphoretic with mild increased work of breathing speaking in moderate phrases. She does appear generally fatigued. Eyes: Conjunctivae are normal. PERRL. EOMI. Head: Atraumatic. Nose: No congestion/rhinnorhea. Mouth/Throat: Mucous membranes are dry.  Oropharynx non-erythematous. Neck: No stridor.   Cardiovascular: Tachycardic rate, regular rhythm. Grossly normal heart sounds.  Good peripheral circulation. Respiratory: Mild increased work of breathing with slight use of accessory muscles. Upper lung fields are clear. There is no wheezing, the patient does have frequent rhonchi within the lower lobes and possibly rales. Gastrointestinal: Soft and nontender. No distention. No abdominal bruits. No CVA tenderness. Musculoskeletal: No lower extremity tenderness nor edema.  No joint effusions. Neurologic:  Normal speech and language. No gross focal neurologic deficits are appreciated. Skin:  Skin is warm, dry and intact. No rash noted. Psychiatric: Mood and affect are normal. Speech and behavior are normal.  ____________________________________________   LABS (all labs ordered are listed, but only abnormal results are displayed)  Labs Reviewed  CBC WITH DIFFERENTIAL/PLATELET - Abnormal; Notable for the following:    WBC 18.8 (*)    RDW 16.6 (*)    Neutro Abs 14.4 (*)    All other components within normal limits  BASIC METABOLIC PANEL - Abnormal; Notable for the following:    Glucose, Bld 114 (*)    All other components within normal limits  CULTURE, BLOOD  (ROUTINE X 2)  CULTURE, BLOOD (ROUTINE X 2)  LACTIC ACID, PLASMA  LACTIC ACID, PLASMA  URINALYSIS COMPLETEWITH MICROSCOPIC ([facility] ONLY)  URINE DRUG SCREEN, QUALITATIVE ([facility] ONLY)   ____________________________________________  EKG  Reviewed and interpreted by me Sinus tachycardia  ____________________________________________  RADIOLOGY  DG Chest 2 View (Final result) Result time: 01/23/15 17:08:37   Final result by Rad Results In Interface (01/23/15 17:08:37)  Narrative:   CLINICAL DATA: Cough and congestion, short of breath. Chest pain. Intermittent fever.  EXAM: CHEST 2 VIEW  COMPARISON: Radiograph 12/21/2014  FINDINGS: Normal cardiac silhouette. No effusion, infiltrate, or pneumothorax. Lungs are hyperinflated. RIGHT shoulder arthroplasty.  IMPRESSION: Hyperinflated lungs without acute findings.     ____________________________________________   PROCEDURES  Procedure(s) performed: None  Critical Care performed: No  ____________________________________________   INITIAL IMPRESSION / ASSESSMENT AND PLAN / ED COURSE  Pertinent labs & imaging results that were available during my care of the patient were reviewed by me and considered in my medical decision making (see chart for details).  Patient presented with fever, tachycardia and productive cough with dyspnea. Her oxygen saturation is borderline hypoxic at 91% requiring nasal cannula. She is a smoker but does not demonstrate wheezing, but does demonstrate increased work of breathing. I'll place her on oxygen, administer a nebulizer treatment, obtain x-rays, blood cultures and evaluate for possibility of what appears to be pneumonia, possible bronchitis, or other acute chest infection based on history.  ----------------------------------------- 6:37 PM on 01/23/2015 -----------------------------------------  Patient improved after breathing treatments and with oxygen administration. She does  report mild nausea this time. Admitting the hospital for ongoing treatment. ____________________________________________   FINAL CLINICAL IMPRESSION(S) / ED DIAGNOSES  Final diagnoses:  Acute bronchiolitis due to unspecified organism  Sepsis, due to unspecified organism      Sharyn Creamer, MD 01/23/15 [phone]

## 2015-01-23 NOTE — ED Notes (Signed)
Patient transported to CT 

## 2015-01-24 LAB — CBC
HCT: 31.4 % — ABNORMAL LOW (ref 35.0–47.0)
HEMOGLOBIN: 10.6 g/dL — AB (ref 12.0–16.0)
MCH: 29.9 pg (ref 26.0–34.0)
MCHC: 33.9 g/dL (ref 32.0–36.0)
MCV: 88.3 fL (ref 80.0–100.0)
Platelets: 327 10*3/uL (ref 150–440)
RBC: 3.56 MIL/uL — ABNORMAL LOW (ref 3.80–5.20)
RDW: 16.8 % — AB (ref 11.5–14.5)
WBC: 12.4 10*3/uL — ABNORMAL HIGH (ref 3.6–11.0)

## 2015-01-24 LAB — BASIC METABOLIC PANEL
ANION GAP: 9 (ref 5–15)
BUN: 14 mg/dL (ref 6–20)
CALCIUM: 8.8 mg/dL — AB (ref 8.9–10.3)
CO2: 19 mmol/L — ABNORMAL LOW (ref 22–32)
Chloride: 113 mmol/L — ABNORMAL HIGH (ref 101–111)
Creatinine, Ser: 0.85 mg/dL (ref 0.44–1.00)
GFR calc Af Amer: >60 mL/min (ref >60)
GLUCOSE: 255 mg/dL — AB (ref 65–99)
Potassium: 3.8 mmol/L (ref 3.5–5.1)
Sodium: 141 mmol/L (ref 135–145)

## 2015-01-24 LAB — LACTIC ACID, PLASMA: LACTIC ACID, VENOUS: 4.6 mmol/L — AB (ref 0.5–2.0)

## 2015-01-24 LAB — EXPECTORATED SPUTUM ASSESSMENT W REFEX TO RESP CULTURE

## 2015-01-24 LAB — EXPECTORATED SPUTUM ASSESSMENT W GRAM STAIN, RFLX TO RESP C: Special Requests: NORMAL

## 2015-01-24 LAB — HEMOGLOBIN A1C: HEMOGLOBIN A1C: 5.6 % (ref 4.0–6.0)

## 2015-01-24 MED ORDER — GUAIFENESIN 100 MG/5ML PO SOLN
15.0000 mL | Freq: Three times a day (TID) | ORAL | Status: DC | PRN
  Administered 2015-01-24: 300 mg via ORAL
  Filled 2015-01-24 (×2): qty 20

## 2015-01-24 MED ORDER — INFLUENZA VAC SPLIT QUAD 0.5 ML IM SUSY: 0.5000 mL | PREFILLED_SYRINGE | INTRAMUSCULAR | Status: DC

## 2015-01-24 NOTE — Progress Notes (Signed)
Initial Nutrition Assessment    INTERVENTION:   Meals and Snacks: Cater to patient preferences   NUTRITION DIAGNOSIS:   No nutrition diagnosis at this time  GOAL:   Patient will meet greater than or equal to 90% of their needs  MONITOR:    (Energy Intake, Anthropometrics, Electrolyte/Renal Profile,  Digestive System)  REASON FOR ASSESSMENT:   Malnutrition Screening Tool    ASSESSMENT:    Pt admitted with acute respiratory failure due to bilateral pneumonia  Past Medical History  Diagnosis Date  . Sciatica of right side   . Bulging lumbar disc   . Insomnia   . Sciatic leg pain     Right side  . Weakness     Bil knees, and ankles  . Bulging lumbar disc   . Asthma      Diet Order:  Diet regular Room service appropriate?: Yes; Fluid consistency:: Thin   Energy Intake: no recorded po intake, pt reports she ate really well today, ate breakfast and lunch, good appetite  Food and Nutrition related history: appetite good prior to admission  Electrolyte and Renal Profile:  Recent Labs Lab 01/23/15 1638 01/23/15 2124 01/24/15 0437  BUN 12  --  14  CREATININE 0.73 0.93 0.85  NA 138  --  141  K 3.7  --  3.8   Meds: solumedrol  Height:   Ht Readings from Last 1 Encounters:  01/23/15  (1.676 m)    Weight: weight relatively stable  Wt Readings from Last 1 Encounters:  01/23/15 153 lb 10.6 oz (69.7 kg)    Filed Weights   01/23/15 1618 01/23/15 2045  Weight: 150 lb (68.04 kg) 153 lb 10.6 oz (69.7 kg)   Wt Readings from Last 10 Encounters:  01/23/15 153 lb 10.6 oz (69.7 kg)  12/30/14 156 lb (70.761 kg)  12/22/14 156 lb (70.761 kg)  12/21/14 156 lb (70.761 kg)  12/19/14 157 lb (71.215 kg)  09/16/14 152 lb (68.947 kg)    BMI:  Body mass index is 24.81 kg/(m^2).  LOW Care Level  Romelle Starcher MS, [place], LDN [phone] Pager

## 2015-01-24 NOTE — Progress Notes (Addendum)
Lab called with Critical Lactic Acid lab value of 3.3 around 10:30pm. DR. Anne Hahn notified and gave new order for 1 liter bolus of NS and redraw lactic acid in AM. Bolus given and patient tolerated it well.

## 2015-01-24 NOTE — Progress Notes (Signed)
Spoke with Dr. Juliene Pina regarding patient complaint of lower abdominal pain in the bladder area.  Continue to monitor.  No new orders.

## 2015-01-24 NOTE — Progress Notes (Signed)
Pt refused nebulizer treatment. Pt states that she is nervous and just wants to sleep. Pt doesn't want a nebulizer treatment unless she calls for one. Dawn, RN assigned to pt was notified.

## 2015-01-24 NOTE — Progress Notes (Signed)
Called by nursing due to patient leaving AMA.  Pt walked out stating that "she had everything she needed at home".  Nursing explained to patient the risks of leaving with incompletely treated pneumonia, including death.  Patient stated she understood and signed AMA forms and left.  This Clinical research associate did not have the opportunity to discuss the matter with the patient prior to her leaving.  Kristeen Miss [facility] 01/24/2015, 10:12 PM

## 2015-01-24 NOTE — Progress Notes (Signed)
Spoke with Dr. Juliene Pina to obtain order for cough medicine for patient.  Order received.

## 2015-01-24 NOTE — Progress Notes (Signed)
Inpatient Diabetes Program Recommendations  AACE/ADA: New Consensus Statement on Inpatient Glycemic Control (2015)  Target Ranges:  Prepandial:   less than 140 mg/dL      Peak postprandial:   less than 180 mg/dL (1-2 hours)      Critically ill patients:  140 - 180 mg/dL   Review of Glycemic Control   Results for Shawna [name], BROSHEARS (MRN [id number]) as of 01/24/2015 10:46  Ref. Range 12/30/2014 20:34 12/31/2014 08:55 01/23/2015 16:38 01/23/2015 19:48 01/23/2015 21:24 01/24/2015 00:19 01/24/2015 04:37  Glucose Latest Ref Range: 65-99 mg/dL 409 (H)  811 (H)    914 (H)   Diabetes history: None Outpatient Diabetes medications: none Current orders for Inpatient glycemic control: none  Inpatient Diabetes Program Recommendations:    A1C pending.  Consider starting Novolog sensitive correction 0-9 units tid with meals.   Susette Racer, RN, BA, MHA, CDE Diabetes Coordinator Inpatient Diabetes Program  [phone] (Team Pager) [phone] [facility] Office) 01/24/2015 10:47 AM

## 2015-01-24 NOTE — Progress Notes (Signed)
Paged and spoke with Dr. Juliene Pina at patient's request - patient feels like IV fluid rate is causing abdominal pain/bladder fullness and would like to see if rate can be reduced.  Per Dr. Juliene Pina will change rate to 50.

## 2015-01-24 NOTE — Progress Notes (Signed)
Lab called with Critical Lactic Acid lab value of 4.6 around 0605. DR. Betti Cruz notified and stated it was most likely due to her admitting diagnosis and to just follow up with AM Doctors.

## 2015-01-24 NOTE — Progress Notes (Signed)
[facility] - [place] at [facility]   PATIENT NAME: Lachae Hohler    MR#:  [id number]  DATE OF BIRTH:  10-30-1965  SUBJECTIVE:  Patient is feeling better than yesterday. Patient is also complaining of bilateral leg pain which says she is spoken with her physician about.  REVIEW OF SYSTEMS:    Review of Systems  Constitutional: Negative for fever, chills and malaise/fatigue.  HENT: Negative for sore throat.   Eyes: Negative for blurred vision.  Respiratory: Positive for cough. Negative for hemoptysis, shortness of breath and wheezing.   Cardiovascular: Negative for chest pain, palpitations and leg swelling.  Gastrointestinal: Negative for nausea, vomiting, abdominal pain, diarrhea and blood in stool.  Genitourinary: Negative for dysuria.  Musculoskeletal: Positive for back pain.  Neurological: Negative for dizziness, tremors and headaches.  Endo/Heme/Allergies: Does not bruise/bleed easily.  Psychiatric/Behavioral: Negative for depression.    Tolerating Diet:yes      DRUG ALLERGIES:   Allergies  Allergen Reactions  . Aspirin Other (See Comments)    Reaction:  GI upset   . Morphine And Related Nausea And Vomiting  . Nsaids Other (See Comments)    Reaction:  GI upset   . Vicodin [Hydrocodone-Acetaminophen] Nausea And Vomiting    VITALS:  Blood pressure 102/54, pulse 107, temperature 97.6 F (36.4 C), temperature source Oral, resp. rate 20, height  (1.676 m), weight 69.7 kg (153 lb 10.6 oz), SpO2 93 %.  PHYSICAL EXAMINATION:   Physical Exam    LABORATORY PANEL:   CBC  Recent Labs Lab 01/24/15 0437  WBC 12.4*  HGB 10.6*  HCT 31.4*  PLT 327   ------------------------------------------------------------------------------------------------------------------  Chemistries   Recent Labs Lab 01/24/15 0437  NA 141  K 3.8  CL 113*  CO2 19*  GLUCOSE 255*  BUN 14  CREATININE 0.85  CALCIUM 8.8*    ------------------------------------------------------------------------------------------------------------------  Cardiac Enzymes No results for input(s): TROPONINI in the last 168 hours. ------------------------------------------------------------------------------------------------------------------  RADIOLOGY:  Dg Chest 2 View  01/23/2015   CLINICAL DATA:  Cough and congestion, short of breath. Chest pain. Intermittent fever.  EXAM: CHEST  2 VIEW  COMPARISON:  Radiograph 12/21/2014  FINDINGS: Normal cardiac silhouette. No effusion, infiltrate, or pneumothorax. Lungs are hyperinflated. RIGHT shoulder arthroplasty.  IMPRESSION: Hyperinflated lungs without acute findings.   Electronically Signed   By: Genevive Bi M.D.   On: 01/23/2015 17:08   Ct Angio Chest Pe W/cm &/or Wo Cm  01/23/2015   CLINICAL DATA:  Hypoxemia. Productive cough, fever, chills and fatigue for 3 days.  EXAM: CT ANGIOGRAPHY CHEST WITH CONTRAST  TECHNIQUE: Multidetector CT imaging of the chest was performed using the standard protocol during bolus administration of intravenous contrast. Multiplanar CT image reconstructions and MIPs were obtained to evaluate the vascular anatomy.  CONTRAST:  OMNIPAQUE IOHEXOL 350 MG/ML SOLN  COMPARISON:  Chest radiograph earlier this day.  FINDINGS: There are no filling defects within the pulmonary arteries to suggest pulmonary embolus. Evaluation of the mid chest and subsegmental branches in the lower lobes is partially obscured by breathing motion artifact.  The thoracic aorta is normal in caliber. The heart size is normal. Shotty mediastinal lymph nodes with AP window lymph node measuring 6 mm in short axis dimension. Shotty bilateral hilar lymph nodes. There is no pleural or pericardial effusion.  Moderate emphysema most significant at the lung apices and upper lobes. There is lower lobe bronchial thickening with subsegmental bronchial obstruction in the dependent lower lobes, left  greater than right.  Associated left greater than right basilar opacity. No confluent opacity in the upper or middle lobes. There is biapical pleural parenchymal scarring. No dominant pulmonary mass allowing for limitations related to breathing artifact.  No acute abnormality in the included upper abdomen.  There are no acute or suspicious osseous abnormalities. Prominent Schmorl's node mid thoracic vertebra. Right proximal humeral prosthesis partially included.  Review of the MIP images confirms the above findings.  IMPRESSION: 1. Allowing for motion degraded exam, no pulmonary embolus. 2. Moderate emphysema. There is bronchial thickening. Subsegmental bronchial obstruction in both lower lobes may be related to mucoid impaction or aspiration. Endobronchial lesion is considered less likely given the location and bilaterality. Adjacent dependent bibasilar opacities likely sequela of bronchial obstruction, atelectasis or aspiration.   Electronically Signed   By: Rubye Oaks M.D.   On: 01/23/2015 19:33     ASSESSMENT AND PLAN:    49 year old female with a history of chronic back pain, asthma and tobacco dependence who presents with cough, tachycardia and hypoxia.  1. Acute hypoxic respiratory failure: This is secondary to bilateral basilar pneumonia. Patient underwent CT scan which did not show evidence of pulmonary emboli. It is concerning for pneumonia versus bronchial obstruction. Patient's symptoms have improved since admission. I will order pulmonary consultation due to the abnormal CT scan.  2. Community-acquired pneumonia: Patient will continue on Levaquin. Blood cultures are negative today.  3. COPD/asthma: Patient does not appear to be in acute exacerbation at this time. She will have a quick taper of her steroids which was started yesterday.  4. Tobacco dependence: Patient is trying to quit smoking. She will continue nicotine patch.  5. Hypotension with sepsis: Sepsis is secondary to  problem #2. Blood pressure is slowly improving with IV fluids.  Management plans discussed with the patient and she is in agreement.  CODE STATUS: Full  TOTAL TIME TAKING CARE OF THIS PATIENT: 30 minutes.     POSSIBLE D/C 1-2 days, DEPENDING ON CLINICAL CONDITION.   Arno Cullers M.D on 01/24/2015 at 12:23 PM  Between 7am to 6pm - Pager - [phone] After 6pm go to [email or web address] - password [email or web address] [facility]  [place] [facility]  Office  [phone]  CC: Primary care physician; Luna Fuse, MD

## 2015-01-25 NOTE — Progress Notes (Signed)
Pt left AMA. Documented against med in mar.

## 2015-01-25 NOTE — Discharge Summary (Signed)
This a 49 year old female with a history of chronic back pain, asthma and tobacco dependence who is admitted for acute hypoxic respiratory failure and community-acquired pneumonia.  She left AMA on Synthroid 29th 2016.  Please see progress notes for further information on her hospitalization.

## 2015-01-25 NOTE — Progress Notes (Signed)
Pt was quite relaxed and cooperative at beginning of shift. She complained of pain in the upper chest and below the breast and noted that Tylenol which she had been taking was not helping. She had previously noted that she was taking Tylenol because she went through withdrawals after her shoulder surgery in June and was trying not to take anything stronger. However, she noted that she needed something stronger for pain. Tramadol 2 tabs was admin. at 20:12. Approx. an hour later she pulled her TELE leads off and noted that she felt agitated.  I was in another room at the time with an admission, when I came out of the room I noticed a female walking by fully dressed with bags on her shoulder and in her hands. As she passed I realized that this was my Pt. I asked a couple others staff members standing by if that was the Pt from 214 and we rushed to the room and realized that it was. We called security and went behind her and caught up with her in the first floor lobby. The Pt. noted that she was leaving and her mother was on the way to pick her up. She was asked to come back to the floor to sign AMA papers. I met with her and asked why she was leaving and she noted that she had spoken to her mother and had stuff at the house to treat herself. I advised the Pt. that she had a serious illness and the Dr's would not have admitted her if they did not think she needed to be here. She refused to stay, signed the [place] papers at 22:08 and went on her way. The Hospitalis was called and informed of the situation.

## 2015-01-28 LAB — CULTURE, RESPIRATORY W GRAM STAIN: Special Requests: NORMAL

## 2015-01-28 LAB — CULTURE, RESPIRATORY

## 2015-01-30 LAB — CULTURE, BLOOD (ROUTINE X 2)
Culture: NO GROWTH
Culture: NO GROWTH

## 2015-03-28 DIAGNOSIS — J189 Pneumonia, unspecified organism: Secondary | ICD-10-CM

## 2015-03-28 HISTORY — DX: Pneumonia, unspecified organism: J18.9

## 2015-04-08 DIAGNOSIS — Z79899 Other long term (current) drug therapy: Secondary | ICD-10-CM | POA: Insufficient documentation

## 2015-04-08 DIAGNOSIS — R1012 Left upper quadrant pain: Secondary | ICD-10-CM | POA: Diagnosis present

## 2015-04-08 DIAGNOSIS — F17210 Nicotine dependence, cigarettes, uncomplicated: Secondary | ICD-10-CM | POA: Diagnosis not present

## 2015-04-08 DIAGNOSIS — N200 Calculus of kidney: Secondary | ICD-10-CM | POA: Diagnosis not present

## 2015-04-09 ENCOUNTER — Emergency Department
Admission: EM | Admit: 2015-04-09 | Discharge: 2015-04-09 | Disposition: A | Discharge status: Home / Self Care | Payer: Medicaid Other | Attending: Emergency Medicine | Admitting: Emergency Medicine

## 2015-04-09 ENCOUNTER — Encounter: Payer: Self-pay | Admitting: *Deleted

## 2015-04-09 ENCOUNTER — Emergency Department: Payer: Medicaid Other

## 2015-04-09 DIAGNOSIS — N200 Calculus of kidney: Secondary | ICD-10-CM

## 2015-04-09 LAB — CBC WITH DIFFERENTIAL/PLATELET
BASOS ABS: 0.1 10*3/uL (ref 0–0.1)
Basophils Relative: 0 %
Eosinophils Absolute: 0.2 10*3/uL (ref 0–0.7)
Eosinophils Relative: 1 %
HCT: 39.9 % (ref 35.0–47.0)
HEMOGLOBIN: 13.2 g/dL (ref 12.0–16.0)
LYMPHS ABS: 0.8 10*3/uL — AB (ref 1.0–3.6)
LYMPHS PCT: 5 %
MCH: 29.1 pg (ref 26.0–34.0)
MCHC: 33 g/dL (ref 32.0–36.0)
MCV: 88.3 fL (ref 80.0–100.0)
Monocytes Absolute: 0.6 10*3/uL (ref 0.2–0.9)
Monocytes Relative: 4 %
NEUTROS PCT: 90 %
Neutro Abs: 14.2 10*3/uL — ABNORMAL HIGH (ref 1.4–6.5)
Platelets: 310 10*3/uL (ref 150–440)
RBC: 4.51 MIL/uL (ref 3.80–5.20)
RDW: 14.4 % (ref 11.5–14.5)
WBC: 15.8 10*3/uL — AB (ref 3.6–11.0)

## 2015-04-09 LAB — URINALYSIS COMPLETE WITH MICROSCOPIC (ARMC ONLY)
Bacteria, UA: NONE SEEN
Bilirubin Urine: NEGATIVE
Glucose, UA: NEGATIVE mg/dL
Hgb urine dipstick: NEGATIVE
Leukocytes, UA: NEGATIVE
Nitrite: NEGATIVE
PROTEIN: 30 mg/dL — AB
Specific Gravity, Urine: 1.021 (ref 1.005–1.030)
pH: 5 (ref 5.0–8.0)

## 2015-04-09 LAB — COMPREHENSIVE METABOLIC PANEL
ALK PHOS: 98 U/L (ref 38–126)
ALT: 13 U/L — AB (ref 14–54)
AST: 18 U/L (ref 15–41)
Albumin: 4 g/dL (ref 3.5–5.0)
Anion gap: 9 (ref 5–15)
BUN: 14 mg/dL (ref 6–20)
CALCIUM: 9 mg/dL (ref 8.9–10.3)
CO2: 22 mmol/L (ref 22–32)
CREATININE: 0.81 mg/dL (ref 0.44–1.00)
Chloride: 109 mmol/L (ref 101–111)
Glucose, Bld: 165 mg/dL — ABNORMAL HIGH (ref 65–99)
Potassium: 3.9 mmol/L (ref 3.5–5.1)
SODIUM: 140 mmol/L (ref 135–145)
Total Bilirubin: 0.7 mg/dL (ref 0.3–1.2)
Total Protein: 7 g/dL (ref 6.5–8.1)

## 2015-04-09 LAB — LIPASE, BLOOD: Lipase: 26 U/L (ref 11–51)

## 2015-04-09 MED ORDER — KETOROLAC TROMETHAMINE 10 MG PO TABS: 10.0000 mg | ORAL_TABLET | Freq: Three times a day (TID) | ORAL | Status: DC | PRN

## 2015-04-09 MED ORDER — ONDANSETRON 4 MG PO TBDP: 4.0000 mg | ORAL_TABLET | Freq: Three times a day (TID) | ORAL | Status: DC | PRN

## 2015-04-09 MED ORDER — ONDANSETRON 4 MG PO TBDP
4.0000 mg | ORAL_TABLET | Freq: Once | ORAL | Status: AC
  Administered 2015-04-09: 4 mg via ORAL

## 2015-04-09 MED ORDER — HYDROMORPHONE HCL 1 MG/ML IJ SOLN
1.0000 mg | Freq: Once | INTRAMUSCULAR | Status: AC
  Administered 2015-04-09: 1 mg via INTRAVENOUS
  Filled 2015-04-09: qty 1

## 2015-04-09 MED ORDER — ONDANSETRON HCL 4 MG/2ML IJ SOLN
4.0000 mg | Freq: Once | INTRAMUSCULAR | Status: AC
  Administered 2015-04-09: 4 mg via INTRAVENOUS
  Filled 2015-04-09: qty 2

## 2015-04-09 MED ORDER — IOHEXOL 300 MG/ML  SOLN
100.0000 mL | Freq: Once | INTRAMUSCULAR | Status: AC | PRN
  Administered 2015-04-09: 100 mL via INTRAVENOUS

## 2015-04-09 MED ORDER — KETOROLAC TROMETHAMINE 10 MG PO TABS
10.0000 mg | ORAL_TABLET | Freq: Once | ORAL | Status: AC
  Administered 2015-04-09: 10 mg via ORAL
  Filled 2015-04-09: qty 1

## 2015-04-09 MED ORDER — IOHEXOL 240 MG/ML SOLN
25.0000 mL | Freq: Once | INTRAMUSCULAR | Status: AC | PRN
  Administered 2015-04-09: 25 mL via ORAL

## 2015-04-09 MED ORDER — ONDANSETRON 4 MG PO TBDP
ORAL_TABLET | ORAL | Status: AC
  Administered 2015-04-09: 4 mg via ORAL
  Filled 2015-04-09: qty 1

## 2015-04-09 NOTE — ED Notes (Signed)
Pt brought in via ems from home with abd pain.  Sx for 2 weeks.  Pt has vomiting today.  No diarrhea.

## 2015-04-09 NOTE — ED Notes (Signed)
Pt reports abd pain for 2 weeks and states pain worse today with vomiting.  Vomited x 4 today.  No diarrhea.  No dysuria.

## 2015-04-09 NOTE — Discharge Instructions (Signed)
Kidney Stones °Kidney stones (urolithiasis) are deposits that form inside your kidneys. The intense pain is caused by the stone moving through the urinary tract. When the stone moves, the ureter goes into spasm around the stone. The stone is usually passed in the urine.  °CAUSES  °· A disorder that makes certain neck glands produce too much parathyroid hormone (primary hyperparathyroidism). °· A buildup of uric acid crystals, similar to gout in your joints. °· Narrowing (stricture) of the ureter. °· A kidney obstruction present at birth (congenital obstruction). °· Previous surgery on the kidney or ureters. °· Numerous kidney infections. °SYMPTOMS  °· Feeling sick to your stomach (nauseous). °· Throwing up (vomiting). °· Blood in the urine (hematuria). °· Pain that usually spreads (radiates) to the groin. °· Frequency or urgency of urination. °DIAGNOSIS  °· Taking a history and physical exam. °· Blood or urine tests. °· CT scan. °· Occasionally, an examination of the inside of the urinary bladder (cystoscopy) is performed. °TREATMENT  °· Observation. °· Increasing your fluid intake. °· Extracorporeal shock wave lithotripsy--This is a noninvasive procedure that uses shock waves to break up kidney stones. °· Surgery may be needed if you have severe pain or persistent obstruction. There are various surgical procedures. Most of the procedures are performed with the use of small instruments. Only small incisions are needed to accommodate these instruments, so recovery time is minimized. °The size, location, and chemical composition are all important variables that will determine the proper choice of action for you. Talk to your health care provider to better understand your situation so that you will minimize the risk of injury to yourself and your kidney.  °HOME CARE INSTRUCTIONS  °· Drink enough water and fluids to keep your urine clear or pale yellow. This will help you to pass the stone or stone fragments. °· Strain  all urine through the provided strainer. Keep all particulate matter and stones for your health care provider to see. The stone causing the pain may be as small as a grain of salt. It is very important to use the strainer each and every time you pass your urine. The collection of your stone will allow your health care provider to analyze it and verify that a stone has actually passed. The stone analysis will often identify what you can do to reduce the incidence of recurrences. °· Only take over-the-counter or prescription medicines for pain, discomfort, or fever as directed by your health care provider. °· Keep all follow-up visits as told by your health care provider. This is important. °· Get follow-up X-rays if required. The absence of pain does not always mean that the stone has passed. It may have only stopped moving. If the urine remains completely obstructed, it can cause loss of kidney function or even complete destruction of the kidney. It is your responsibility to make sure X-rays and follow-ups are completed. Ultrasounds of the kidney can show blockages and the status of the kidney. Ultrasounds are not associated with any radiation and can be performed easily in a matter of minutes. °· Make changes to your daily diet as told by your health care provider. You may be told to: °¨ Limit the amount of salt that you eat. °¨ Eat 5 or more servings of fruits and vegetables each day. °¨ Limit the amount of meat, poultry, fish, and eggs that you eat. °· Collect a 24-hour urine sample as told by your health care provider. You may need to collect another urine sample every 6-12   months. °SEEK MEDICAL CARE IF: °· You experience pain that is progressive and unresponsive to any pain medicine you have been prescribed. °SEEK IMMEDIATE MEDICAL CARE IF:  °· Pain cannot be controlled with the prescribed medicine. °· You have a fever or shaking chills. °· The severity or intensity of pain increases over 18 hours and is not  relieved by pain medicine. °· You develop a new onset of abdominal pain. °· You feel faint or pass out. °· You are unable to urinate. °  °This information is not intended to replace advice given to you by your health care provider. Make sure you discuss any questions you have with your health care provider. °  °Document Released: 04/13/2005 Document Revised: 01/02/2015 Document Reviewed: 09/14/2012 °Elsevier Interactive Patient Education ©2016 [employer] ° °

## 2015-04-09 NOTE — ED Notes (Signed)
States pain improved with meds.

## 2015-04-09 NOTE — ED Notes (Signed)
Patient transported to CT 

## 2015-04-09 NOTE — ED Provider Notes (Signed)
[facility]  Time seen: 12:15 AM   I have reviewed the triage vital signs and the nursing notes.   HISTORY  Chief Complaint Abdominal Pain     HPI Shawna [name] is a 49 y.o. female presents with left upper quadrant/left lower quadrant 10 out of 10 pain 2 weeks. Patient states her time when the pain improves however it never completely goes away lowest pain score during the period of time is 6 out of 10. She states that she was seen by urgent care however they could not perform any imaging studies. Patient also admits to vomiting which started today of which she's had for nonbloody episodes.     Past Medical History  Diagnosis Date  . Sciatica of right side   . Bulging lumbar disc   . Insomnia   . Sciatic leg pain     Right side  . Weakness     Bil knees, and ankles  . Bulging lumbar disc   . Asthma     Patient Active Problem List   Diagnosis Date Noted  . Bacterial pneumonia 01/23/2015  . Leukocytosis 01/23/2015  . Sepsis (HCC) 01/23/2015  . Hypoxemia 01/23/2015  . Opioid use disorder, moderate, dependence (HCC) 12/31/2014  . Anxiety disorder, unspecified 12/31/2014    Past Surgical History  Procedure Laterality Date  . Tubal ligation    . Abdominal hysterectomy      Partial  . Tonsillectomy    . Shoulder surgery Right     Current Outpatient Rx  Name  Route  Sig  Dispense  Refill  . albuterol (PROVENTIL HFA;VENTOLIN HFA) 108 (90 BASE) MCG/ACT inhaler   Inhalation   Inhale 2 puffs into the lungs every 6 (six) hours as needed for wheezing or shortness of breath.         . cetirizine (ZYRTEC) 10 MG tablet   Oral   Take 10 mg by mouth daily.         . clonazePAM (KLONOPIN) 1 MG tablet   Oral   Take 1-2 mg by mouth 2 (two) times daily. Pt takes one tablet in the morning and two at bedtime.         . hydrOXYzine (ATARAX/VISTARIL) 50 MG tablet  Oral   Take 50-100 mg by mouth 5 (five) times daily. Pt takes one tablet every 6 hours and two tablets at bedtime.         Marland Kitchen ibuprofen (ADVIL,MOTRIN) 200 MG tablet   Oral   Take 400 mg by mouth 3 (three) times daily as needed for mild pain.         Marland Kitchen omeprazole (PRILOSEC) 40 MG capsule   Oral   Take 40 mg by mouth at bedtime.          Marland Kitchen zolpidem (AMBIEN) 10 MG tablet   Oral   Take 10 mg by mouth at bedtime.           Allergies Aspirin; Morphine and related; Nsaids; and Vicodin  Family History  Problem Relation Age of Onset  . Fibromyalgia Mother     Social History Social History  Substance Use Topics  . Smoking status: Current Every Day Smoker -- 1.00 packs/day for 40 years    Types: Cigarettes  . Smokeless tobacco: Never Used  . Alcohol Use: No    Review of Systems  Constitutional: Negative for fever. Eyes: Negative for visual changes. ENT: Negative for sore throat. Cardiovascular: Negative for chest  pain. Respiratory: Negative for shortness of breath. Gastrointestinal: Positive for abdominal pain and vomiting Genitourinary: Negative for dysuria. Musculoskeletal: Negative for back pain. Skin: Negative for rash. Neurological: Negative for headaches, focal weakness or numbness.   10-point ROS otherwise negative.  ____________________________________________   PHYSICAL EXAM:  VITAL SIGNS: ED Triage Vitals  Enc Vitals Group     BP 04/09/15 0003 111/74 mmHg     Pulse Rate 04/09/15 0003 91     Resp 04/09/15 0003 20     Temp 04/09/15 0003 99.2 F (37.3 C)     Temp Source 04/09/15 0003 Oral     SpO2 04/09/15 0003 97 %     Weight 04/09/15 0003 160 lb (72.576 kg)     Height 04/09/15 0003  (1.676 m)     Head Cir --      Peak Flow --      Pain Score 04/09/15 0008 8     Pain Loc --      Pain Edu? --      Excl. in GC? --      Constitutional: Alert and oriented. Well appearing and in no distress. Eyes: Conjunctivae are normal. PERRL. Normal  extraocular movements. ENT   Head: Normocephalic and atraumatic.   Nose: No congestion/rhinnorhea.   Mouth/Throat: Mucous membranes are moist.   Neck: No stridor. Hematological/Lymphatic/Immunilogical: No cervical lymphadenopathy. Cardiovascular: Normal rate, regular rhythm. Normal and symmetric distal pulses are present in all extremities. No murmurs, rubs, or gallops. Respiratory: Normal respiratory effort without tachypnea nor retractions. Breath sounds are clear and equal bilaterally. No wheezes/rales/rhonchi. Gastrointestinal: Tender to palpation left upper quadrant/left lower quadrant. No distention. There is no CVA tenderness. Genitourinary: deferred Musculoskeletal: Nontender with normal range of motion in all extremities. No joint effusions.  No lower extremity tenderness nor edema. Neurologic:  Normal speech and language. No gross focal neurologic deficits are appreciated. Speech is normal.  Skin:  Skin is warm, dry and intact. No rash noted. Psychiatric: Mood and affect are normal. Speech and behavior are normal. Patient exhibits appropriate insight and judgment.  ____________________________________________    LABS (pertinent positives/negatives)  Labs Reviewed  CBC WITH DIFFERENTIAL/PLATELET - Abnormal; Notable for the following:    WBC 15.8 (*)    Neutro Abs 14.2 (*)    Lymphs Abs 0.8 (*)    All other components within normal limits  COMPREHENSIVE METABOLIC PANEL - Abnormal; Notable for the following:    Glucose, Bld 165 (*)    ALT 13 (*)    All other components within normal limits  URINALYSIS COMPLETEWITH MICROSCOPIC ([facility] ONLY) - Abnormal; Notable for the following:    Color, Urine YELLOW (*)    APPearance CLEAR (*)    Ketones, ur TRACE (*)    Protein, ur 30 (*)    Squamous Epithelial / LPF 0-5 (*)    All other components within normal limits  LIPASE, BLOOD       RADIOLOGY CT Abdomen Pelvis W Contrast (Final result) Result time: 04/09/15  02:15:01   Final result by Rad Results In Interface (04/09/15 02:15:01)   Narrative:   CLINICAL DATA: Abdominal pain for 2 weeks, worse today. Vomiting today.  EXAM: CT ABDOMEN AND PELVIS WITH CONTRAST  TECHNIQUE: Multidetector CT imaging of the abdomen and pelvis was performed using the standard protocol following bolus administration of intravenous contrast.  CONTRAST: OMNIPAQUE IOHEXOL 300 MG/ML SOLN  COMPARISON: 03/05/2014  FINDINGS: Atelectasis in the lung bases.  Mild diffuse fatty infiltration of the liver. No  focal liver lesions. The gallbladder, spleen, pancreas, adrenal glands, inferior vena cava, and retroperitoneal lymph nodes are unremarkable. Atherosclerotic changes of mild calcification of the abdominal aorta. Subcentimeter parenchymal cysts in the kidneys. Stones in both kidneys, largest in the lower pole left kidney measures 6 mm diameter. No ureteral stones. No hydronephrosis or hydroureter. Renal nephrograms are symmetrical. Stomach, small bowel, and colon are not abnormally distended. No free air or free fluid in the abdomen.  Pelvis: The appendix is not identified. No free or loculated pelvic fluid collections. No pelvic mass or lymphadenopathy. Uterus appears surgically absent. Bladder wall is not thickened. Focal areas of sclerosis in the superior femoral heads bilaterally suggesting avascular necrosis. No acute fracture.  IMPRESSION: No focal acute process demonstrated in the abdomen or pelvis. No evidence of bowel obstruction or inflammation. Bilateral nonobstructing renal stones. 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   Electronically Signed By: Burman NievesWilliam Stevens M.D. On: 04/09/2015 02:15        ECG Results          INITIAL IMPRESSION / ASSESSMENT AND PLAN / ED COURSE  Pertinent labs & imaging results that were available during my care of the patient were reviewed by me and considered in my  medical decision making (see chart for details).  Agent received IV Dilaudid and Zofran for pain and analgesia in the emergency department. No focal acute process was noted on the CT scan of the abdomen and pelvis nonobstructing bilateral renal stones were noted.  ____________________________________________   FINAL CLINICAL IMPRESSION(S) / ED DIAGNOSES  Final diagnoses:  Kidney stones      Darci Currentandolph N Oris Staffieri, MD 04/09/15 [phone]

## 2015-04-12 ENCOUNTER — Encounter: Payer: Self-pay | Admitting: Obstetrics and Gynecology

## 2015-04-12 ENCOUNTER — Ambulatory Visit (INDEPENDENT_AMBULATORY_CARE_PROVIDER_SITE_OTHER): Payer: Medicaid Other | Admitting: Obstetrics and Gynecology

## 2015-04-12 VITALS — BP 129/89 | HR 98 | Resp 16 | Ht 66.0 in | Wt 164.1 lb

## 2015-04-12 DIAGNOSIS — R3129 Other microscopic hematuria: Secondary | ICD-10-CM

## 2015-04-12 DIAGNOSIS — N200 Calculus of kidney: Secondary | ICD-10-CM | POA: Diagnosis not present

## 2015-04-12 LAB — URINALYSIS, COMPLETE
BILIRUBIN UA: NEGATIVE
Glucose, UA: NEGATIVE
Ketones, UA: NEGATIVE
LEUKOCYTES UA: NEGATIVE
Nitrite, UA: NEGATIVE
PH UA: 5.5 (ref 5.0–7.5)
Urobilinogen, Ur: 0.2 mg/dL (ref 0.2–1.0)

## 2015-04-12 LAB — MICROSCOPIC EXAMINATION: Epithelial Cells (non renal): >10 /hpf — ABNORMAL HIGH (ref 0–10)

## 2015-04-12 NOTE — Progress Notes (Signed)
04/12/2015 2:00 PM   Shawna [name] 1966-01-31 [id number]  Referring provider: Sherron Monday, MD [address] [place], [place] [address]  Chief Complaint  Patient presents with  . Nephrolithiasis  . Establish Care    HPI: Patient is a 49 year old female who presented to the emergency department on 04/09/15 with onset of left upper quadrant pain. She presents today for follow-up for nonobstructing bilateral renal stones seen on CT scan performed in ED. Scan showed the largest in the left lower pole measuring 6 mm in diameter. There were no ureteral stones or hydronephrosis. Today she complains of continued left upper quadrant and left flank pain. She reports no gross hematuria or other urinary symptoms. No fevers.  Previous history of stones seen on x-ray but unsure if she has ever passed one  No cardiac issues. Does have asthma but states that she only requires an inhaler when she has a respiritory illness.   Microscopic hematuria was noted on urinalysis in the emergency department. Patient is a current smoker.   PMH: Past Medical History  Diagnosis Date  . Sciatica of right side   . Bulging lumbar disc   . Insomnia   . Sciatic leg pain     Right side  . Weakness     Bil knees, and ankles  . Bulging lumbar disc   . Asthma     Surgical History: Past Surgical History  Procedure Laterality Date  . Tubal ligation    . Abdominal hysterectomy      Partial  . Tonsillectomy    . Shoulder surgery Right     Home Medications:    Medication List       This list is accurate as of: 04/12/15  2:00 PM.  Always use your most recent med list.               albuterol 108 (90 BASE) MCG/ACT inhaler  Commonly known as:  PROVENTIL HFA;VENTOLIN HFA  Inhale 2 puffs into the lungs every 6 (six) hours as needed for wheezing or shortness of breath.     hydrOXYzine 50 MG tablet  Commonly known as:  ATARAX/VISTARIL  Take 50-100 mg by mouth 5 (five) times daily. Pt takes one  tablet every 6 hours and two tablets at bedtime.     ibuprofen 200 MG tablet  Commonly known as:  ADVIL,MOTRIN  Take 400 mg by mouth 3 (three) times daily as needed for mild pain.     ketorolac 10 MG tablet  Commonly known as:  TORADOL  Take 1 tablet (10 mg total) by mouth every 8 (eight) hours as needed.     loratadine 10 MG tablet  Commonly known as:  CLARITIN  Take 10 mg by mouth.     omeprazole 40 MG capsule  Commonly known as:  PRILOSEC  Take 40 mg by mouth at bedtime.     ondansetron 4 MG disintegrating tablet  Commonly known as:  ZOFRAN ODT  Take 1 tablet (4 mg total) by mouth every 8 (eight) hours as needed for nausea or vomiting.     traMADol-acetaminophen 37.5-325 MG tablet  Commonly known as:  ULTRACET  TK 1 T PO Q 6 H     zolpidem 10 MG tablet  Commonly known as:  AMBIEN  Take 10 mg by mouth at bedtime.        Allergies:  Allergies  Allergen Reactions  . Aspirin Other (See Comments)    Reaction:  GI upset   . Morphine  And Related Nausea And Vomiting  . Nsaids Other (See Comments)    Reaction:  GI upset   . Vicodin [Hydrocodone-Acetaminophen] Nausea And Vomiting    Family History: Family History  Problem Relation Age of Onset  . Fibromyalgia Mother     Social History:  reports that she has been smoking Cigarettes.  She has a 40 pack-year smoking history. She has never used smokeless tobacco. She reports that she does not drink alcohol or use illicit drugs.  ROS: UROLOGY Frequent Urination?: No Hard to postpone urination?: No Burning/pain with urination?: No Get up at night to urinate?: Yes Leakage of urine?: No Urine stream starts and stops?: No Trouble starting stream?: Yes Do you have to strain to urinate?: No Blood in urine?: No Urinary tract infection?: No Sexually transmitted disease?: No Injury to kidneys or bladder?: No Painful intercourse?: No Weak stream?: No Currently pregnant?: No Vaginal bleeding?: No Last menstrual period?:  n  Gastrointestinal Nausea?: Yes Vomiting?: Yes Indigestion/heartburn?: No Diarrhea?: Yes Constipation?: No  Constitutional Fever: No Night sweats?: Yes Weight loss?: No Fatigue?: No  Skin Skin rash/lesions?: No Itching?: No  Eyes Blurred vision?: No Double vision?: No  Ears/Nose/Throat Sore throat?: No Sinus problems?: No  Hematologic/Lymphatic Swollen glands?: No Easy bruising?: No  Cardiovascular Leg swelling?: No Chest pain?: No  Respiratory Cough?: No Shortness of breath?: No  Endocrine Excessive thirst?: No  Musculoskeletal Back pain?: Yes Joint pain?: No  Neurological Headaches?: Yes Dizziness?: No  Psychologic Depression?: No Anxiety?: Yes  Physical Exam: BP 129/89 mmHg  Pulse 98  Resp 16  Ht  (1.676 m)  Wt 164 lb 1.6 oz (74.435 kg)  BMI 26.50 kg/m2  Constitutional:  Alert and oriented, No acute distress. HEENT: [place] AT, moist mucus membranes.  Trachea midline, no masses. Cardiovascular: No clubbing, cyanosis, or edema, RRR Respiratory: Normal respiratory effort, no increased work of breathing, CTAB GI: Abdomen is soft, LUQ tenderness, nondistended, no abdominal masses GU: Left CVA tenderness.  Skin: No rashes, bruises or suspicious lesions. Lymph: No cervical or inguinal adenopathy. Neurologic: Grossly intact, no focal deficits, moving all 4 extremities. Psychiatric: Normal mood and affect.  Laboratory Data:   Urinalysis Results for orders placed or performed in visit on 04/12/15  Microscopic Examination  Result Value Ref Range   WBC, UA 0-5 0 -  5 /hpf   RBC, UA 0-2 0 -  2 /hpf   Epithelial Cells (non renal) >10 (H) 0 - 10 /hpf   Crystals Present (A) N/A   Crystal Type Calcium Oxalate N/A   Mucus, UA Present (A) Not Estab.   Bacteria, UA Many (A) None seen/Few  Urinalysis, Complete  Result Value Ref Range   Specific Gravity, UA >1.030 (H) 1.005 - 1.030   pH, UA 5.5 5.0 - 7.5   Color, UA Yellow Yellow   Appearance  Ur Clear Clear   Leukocytes, UA Negative Negative   Protein, UA Trace (A) Negative/Trace   Glucose, UA Negative Negative   Ketones, UA Negative Negative   RBC, UA Trace (A) Negative   Bilirubin, UA Negative Negative   Urobilinogen, Ur 0.2 0.2 - 1.0 mg/dL   Nitrite, UA Negative Negative   Microscopic Examination See below:     Pertinent Imaging: CLINICAL DATA: Abdominal pain for 2 weeks, worse today. Vomiting today.  EXAM: CT ABDOMEN AND PELVIS WITH CONTRAST  TECHNIQUE: Multidetector CT imaging of the abdomen and pelvis was performed using the standard protocol following bolus administration of intravenous contrast.  CONTRAST:  100mL OMNIPAQUE IOHEXOL 300 MG/ML SOLN  COMPARISON: 03/05/2014  FINDINGS: Atelectasis in the lung bases.  Mild diffuse fatty infiltration of the liver. No focal liver lesions. The gallbladder, spleen, pancreas, adrenal glands, inferior vena cava, and retroperitoneal lymph nodes are unremarkable. Atherosclerotic changes of mild calcification of the abdominal aorta. Subcentimeter parenchymal cysts in the kidneys. Stones in both kidneys, largest in the lower pole left kidney measures 6 mm diameter. No ureteral stones. No hydronephrosis or hydroureter. Renal nephrograms are symmetrical. Stomach, small bowel, and colon are not abnormally distended. No free air or free fluid in the abdomen.  Pelvis: The appendix is not identified. No free or loculated pelvic fluid collections. No pelvic mass or lymphadenopathy. Uterus appears surgically absent. Bladder wall is not thickened. Focal areas of sclerosis in the superior femoral heads bilaterally suggesting avascular necrosis. No acute fracture.  IMPRESSION: No focal acute process demonstrated in the abdomen or pelvis. No evidence of bowel obstruction or inflammation. Bilateral nonobstructing renal stones. 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 Electronically Signed   By: Burman NievesWilliam Stevens M.D.  On: 04/09/2015 02:15  Pertinent labs & imaging results that were available during my care of the patient were reviewed by me and considered in my medical decision making (see chart for details).  Assessment & Plan:    1. Nephrolithiasis- We discussed various treatment options including ESWL vs. ureteroscopy, laser lithotripsy, and stent. We discussed the risks and benefits of both including bleeding, infection, damage to surrounding structures, efficacy with need for possible further intervention, and need for temporary ureteral stent. Patient's renal stone is in her left lower pole that she is not the best candidate for ESWL. Patient opted for ureteroscopy. Patient's questions were answered and surgery will be scheduled. She was instructed to discontinue Toradol and only take tramadol for her pain. She reports she has had previous surgeries and had no complications with general anesthesia. -Urine Culture  2. Microscopic hematuria- Microscopic hematuria noted on urinalysis performed in the emergency department. We discussed the differential diagnosis for microscopic hematuria including nephrolithiasis, renal or upper tract tumors, bladder stones, UTIs, or bladder tumors as well as undetermined etiologies. Per AUA guidelines, I did recommend complete microscopic hematuria evaluation including CTU, possible urine cytology, and office cystoscopy.  Blood is most likely due to patient's renal stones though she is a current smoker.  We will perform bilateral retrograde pyelograms and cystoscopy while patient is under anesthesia for ureteroscopy for stone removal for completion of hematuria work up.  I spent 30 min with this patient of which greater than 50% was spent in counseling and coordination of care with the patient.    There are no diagnoses linked to this encounter.  Return for schedule surgery.  These notes generated with voice recognition software. I apologize  for typographical errors.  Earlie LouLindsay Esme Durkin, FNP  [facility] [address], [address] [place], [place] [address] [phone]

## 2015-04-12 NOTE — Patient Instructions (Signed)
                                                     Ureteroscopy  A Ureteroscopy is an examination of the upper urinary tract, performed with a ureteroscope that is passed through the urethra, the bladder, and then directly into the ureter. It is performed to find the cause of urine blockage in a ureter, perform a biopsy or identify and evaluate other abnormalities inside the ureters or kidneys. It is useful in the diagnosis and treatment of disorders such as kidney stones, ureteral strictures or other abnormalities of the ureter. Smaller stones in the bladder or lower ureter can be removed in one piece, while bigger ones are usually broken before removal during ureteroscopy.  *After your ureteroscopy, your doctor may need to place a stent in a ureter to drain urine from the kidney to the bladder while swelling in the ureter goes away. The stent may cause some discomfort to your kidney and ureter. The discomfort is generally mild. The stent may be left in for a week or more. You will be instructed to either remove the stent yourself at home by pulling a string protruding from your urethra or to return to our office for removal by your doctor.  After a ureteroscopy you may  have a mild burning feeling when urinating see small amounts of blood in the urine have mild discomfort in the bladder area or kidney area when urinating need to urinate more frequently or urgently *These problems should not last more than 24 hours unless a ureteral stent was placed.  If a stent was placed symptoms symptoms may continue until it is removed. You should notify your doctor right away if bleeding or pain is severe.  To prevent or relieve discomfort it can be helpful to drink 16 ounces of water each hour for 2 hours after the procedure take a warm bath to relieve the burning feeling hold a warm, damp washcloth over the urethral opening to relieve discomfort take an over-the-counter pain reliever  Please notify  our office if you experience any of the following symptoms burning with urination lasting more than 24hrs Fever greater than 100F or present directly to emergency department abdominal or flank pain very bloody, cloudy or foul smelling urine  *If you have any additional questions or concerns please call our office at [phone]  [facility] [address], [address] [place], [place] [address] [phone]    Ureteral Stent  Patient Education A stent is a hollow tube that maintains patency until healing can take place or an obstruction is relieved. It allows urine to flow from the kidney to the bladder.  Indications for stenting include: Relief of ureteral obstruction (stones, cancer, stricture) and provide drainage; Promote healing of the ureter by providing internal support after a ureteral procedure  Prevent potential complications by helping place a guidewire into the ureter; Assist in dilating the ureter before the next ureteroscopy; Bypass obstructions, either from internal or external causes;  Procedure: Under general anesthesia in a cystoscopy suite, the ureteroscope is introduced into the bladder through the urethra and then up into the desired ureter.   Stent Removal: Remove in 2-7 days after ureteroscopy in uncomplicated cases; Remove in 1-2 weeks in cases of ureteral perforation or persisting concern of obstruction; A string   may be left on your stent and you will be instructed by your provider when to gently pull the string to remove your stent at home.  Can be removed in the office with topical anesthesia with a flexible ureteroscope and grasper; Can be removed in the cysto suite under anesthesia for patients unable to tolerate topical anesthesia; If required for long term use (extrinsic compression by tumor, stricture), stents should be changed every 6 months  What to expect while stent is in place Blood in the urine intermittently while  stent is in place. Usually it is most severe the first few days after surgery, but it can persist the entire time that the stent is in place. Symptoms of urinary frequency and urgency that is caused by the lower end of the stent coiling into and irritating the bladder.  Precautions: Do not have sexual intercourse while stent is in place or participate in other strenuous activity.  Please notify your physician's office as soon as possible if: your stent falls out. It is very rare but if the stent comes out, please rinse it with water, place it in a ziplock bag and bring it with you to your appointment. you are experiencing extreme pain, prolonged nausea/vomiting or fever >100.5 F   [facility] [address], [address] , [place] [address] [phone]             

## 2015-04-15 ENCOUNTER — Telehealth: Payer: Self-pay

## 2015-04-15 ENCOUNTER — Other Ambulatory Visit: Payer: Self-pay

## 2015-04-15 ENCOUNTER — Other Ambulatory Visit: Payer: Medicaid Other

## 2015-04-15 DIAGNOSIS — N390 Urinary tract infection, site not specified: Secondary | ICD-10-CM

## 2015-04-15 LAB — CULTURE, URINE COMPREHENSIVE

## 2015-04-15 NOTE — Telephone Encounter (Signed)
-----   Message from Fernanda DrumLindsay C Overton, FNP sent at 04/15/2015 10:31 AM EST ----- Please notify patient that her urine culture grew out a small amount of bacteria. This is typically not considered clinically significant but since she is being set up for surgery I would like for her to return to provide us with another clean-catch specimen to be sent for culture prior to surgery. thanks

## 2015-04-15 NOTE — Telephone Encounter (Signed)
Spoke with pt in reference to ucx. Pt voiced understanding. Pt stated she does not have a car therefore she will have schedule a ride. Reinforced with pt to call before she comes so we can be prepared for her.

## 2015-04-16 ENCOUNTER — Telehealth: Payer: Self-pay

## 2015-04-16 ENCOUNTER — Emergency Department: Payer: Medicaid Other

## 2015-04-16 ENCOUNTER — Emergency Department
Admission: EM | Admit: 2015-04-16 | Discharge: 2015-04-16 | Disposition: A | Discharge status: Home / Self Care | Payer: Medicaid Other | Attending: Emergency Medicine | Admitting: Emergency Medicine

## 2015-04-16 ENCOUNTER — Encounter: Payer: Self-pay | Admitting: Emergency Medicine

## 2015-04-16 ENCOUNTER — Telehealth: Payer: Self-pay | Admitting: Radiology

## 2015-04-16 DIAGNOSIS — R1012 Left upper quadrant pain: Secondary | ICD-10-CM | POA: Diagnosis not present

## 2015-04-16 DIAGNOSIS — R109 Unspecified abdominal pain: Secondary | ICD-10-CM

## 2015-04-16 DIAGNOSIS — F17210 Nicotine dependence, cigarettes, uncomplicated: Secondary | ICD-10-CM | POA: Insufficient documentation

## 2015-04-16 DIAGNOSIS — Z79899 Other long term (current) drug therapy: Secondary | ICD-10-CM | POA: Diagnosis not present

## 2015-04-16 DIAGNOSIS — Z791 Long term (current) use of non-steroidal anti-inflammatories (NSAID): Secondary | ICD-10-CM | POA: Diagnosis not present

## 2015-04-16 HISTORY — DX: Calculus of kidney: N20.0

## 2015-04-16 LAB — URINALYSIS COMPLETE WITH MICROSCOPIC (ARMC ONLY)
BILIRUBIN URINE: NEGATIVE
Bacteria, UA: NONE SEEN
GLUCOSE, UA: NEGATIVE mg/dL
Hgb urine dipstick: NEGATIVE
KETONES UR: NEGATIVE mg/dL
Leukocytes, UA: NEGATIVE
Nitrite: NEGATIVE
PROTEIN: NEGATIVE mg/dL
Specific Gravity, Urine: 1.02 (ref 1.005–1.030)
pH: 6 (ref 5.0–8.0)

## 2015-04-16 LAB — MICROSCOPIC EXAMINATION: RBC MICROSCOPIC, UA: NONE SEEN /HPF (ref 0–?)

## 2015-04-16 LAB — URINALYSIS, COMPLETE
Bilirubin, UA: NEGATIVE
Glucose, UA: NEGATIVE
LEUKOCYTES UA: NEGATIVE
NITRITE UA: NEGATIVE
PH UA: 6 (ref 5.0–7.5)
Protein, UA: NEGATIVE
RBC, UA: NEGATIVE
Specific Gravity, UA: 1.025 (ref 1.005–1.030)
Urobilinogen, Ur: 1 mg/dL (ref 0.2–1.0)

## 2015-04-16 MED ORDER — GI COCKTAIL ~~LOC~~
30.0000 mL | Freq: Once | ORAL | Status: AC
  Administered 2015-04-16: 30 mL via ORAL
  Filled 2015-04-16: qty 30

## 2015-04-16 MED ORDER — OXYCODONE-ACETAMINOPHEN 5-325 MG PO TABS
ORAL_TABLET | ORAL | Status: AC
  Administered 2015-04-16: 1 via ORAL
  Filled 2015-04-16: qty 1

## 2015-04-16 MED ORDER — OXYCODONE-ACETAMINOPHEN 5-325 MG PO TABS
1.0000 | ORAL_TABLET | Freq: Once | ORAL | Status: AC
  Administered 2015-04-16: 1 via ORAL

## 2015-04-16 MED ORDER — ONDANSETRON 4 MG PO TBDP
ORAL_TABLET | ORAL | Status: AC
  Administered 2015-04-16: 4 mg via ORAL
  Filled 2015-04-16: qty 1

## 2015-04-16 MED ORDER — ONDANSETRON 4 MG PO TBDP
4.0000 mg | ORAL_TABLET | Freq: Once | ORAL | Status: AC
  Administered 2015-04-16: 4 mg via ORAL

## 2015-04-16 NOTE — ED Notes (Addendum)
Went to pt's room to reassess pain and pt and family not in room. Housekeeping cleaning room and states it was empty. MD aware and states he was working on her discharge paperwork.

## 2015-04-16 NOTE — Telephone Encounter (Signed)
Notified pt of surgery scheduled 05/07/15, pre-admit testing appt on 04/30/15 @8 :15 and to call day prior to surgery for arrival time to SDS. Pt voices understanding.

## 2015-04-16 NOTE — Telephone Encounter (Signed)
No sorry.  She does not have any obstructing stones therefore her stones should not be causing pain.    If she thinks she is passing a stone, she can have a KUB but CT just 2 weeks ago showed no ureteral stones.    Shawna ScotlandAshley Robinette Esters, MD

## 2015-04-16 NOTE — ED Notes (Signed)
Pt states she was seen here one week ago and diagnosed with a kidney stone on the left side. Pt states the pain increased tonight and she couldn't take it. Pt states she has an appt. on January 10th with the Urologist.

## 2015-04-16 NOTE — ED Notes (Signed)
Patient transported to X-ray 

## 2015-04-16 NOTE — ED Provider Notes (Signed)
[facility] Emergency Department Provider Note  ____________________________________________  Time seen: 2015  I have reviewed the triage vital signs and the nursing notes.   HISTORY  Chief Complaint Flank Pain   History limited by: Not Limited   HPI BLEU MOISAN is a 49 y.o. female who presents to the emergency department today because of concerns for continued left sided abdominal pain. The patient was seen in the emergency department and diagnosed with a kidney stone 1 week ago. Since that time she did see her neurologist. She states she was scheduled for surgery in January. She states she has continued to have pain. She did run out of her pain medication. She did state to me that the pain was worse with eating. She has had some chronic diarrhea issues. She denies any fevers. No shortness breath or chest pain.   Past Medical History  Diagnosis Date  . Sciatica of right side   . Bulging lumbar disc   . Insomnia   . Sciatic leg pain     Right side  . Weakness     Bil knees, and ankles  . Bulging lumbar disc   . Asthma     Patient Active Problem List   Diagnosis Date Noted  . Bacterial pneumonia 01/23/2015  . Leukocytosis 01/23/2015  . Sepsis (HCC) 01/23/2015  . Hypoxemia 01/23/2015  . Opioid use disorder, moderate, dependence (HCC) 12/31/2014  . Anxiety disorder, unspecified 12/31/2014    Past Surgical History  Procedure Laterality Date  . Tubal ligation    . Abdominal hysterectomy      Partial  . Tonsillectomy    . Shoulder surgery Right     Current Outpatient Rx  Name  Route  Sig  Dispense  Refill  . albuterol (PROVENTIL HFA;VENTOLIN HFA) 108 (90 BASE) MCG/ACT inhaler   Inhalation   Inhale 2 puffs into the lungs every 6 (six) hours as needed for wheezing or shortness of breath.         . hydrOXYzine (ATARAX/VISTARIL) 50 MG tablet   Oral   Take 50-100 mg by mouth 5 (five) times daily. Pt takes one tablet every 6 hours and two  tablets at bedtime.         Marland Kitchen ibuprofen (ADVIL,MOTRIN) 200 MG tablet   Oral   Take 400 mg by mouth 3 (three) times daily as needed for mild pain.         Marland Kitchen ketorolac (TORADOL) 10 MG tablet   Oral   Take 1 tablet (10 mg total) by mouth every 8 (eight) hours as needed.   20 tablet   0   . loratadine (CLARITIN) 10 MG tablet   Oral   Take 10 mg by mouth.         Marland Kitchen omeprazole (PRILOSEC) 40 MG capsule   Oral   Take 40 mg by mouth at bedtime.          . ondansetron (ZOFRAN ODT) 4 MG disintegrating tablet   Oral   Take 1 tablet (4 mg total) by mouth every 8 (eight) hours as needed for nausea or vomiting.   20 tablet   0   . traMADol-acetaminophen (ULTRACET) 37.5-325 MG tablet      TK 1 T PO Q 6 H      0   . zolpidem (AMBIEN) 10 MG tablet   Oral   Take 10 mg by mouth at bedtime.           Allergies Aspirin; Morphine  and related; Nsaids; and Vicodin  Family History  Problem Relation Age of Onset  . Fibromyalgia Mother     Social History Social History  Substance Use Topics  . Smoking status: Current Every Day Smoker -- 1.00 packs/day for 40 years    Types: Cigarettes  . Smokeless tobacco: Never Used  . Alcohol Use: No    Review of Systems  Constitutional: Negative for fever. Cardiovascular: Negative for chest pain. Respiratory: Negative for shortness of breath. Gastrointestinal: Positive for left-sided abdominal pain Neurological: Negative for headaches, focal weakness or numbness.   10-point ROS otherwise negative.  ____________________________________________   PHYSICAL EXAM:  VITAL SIGNS:    98.3 F (36.8 C)  90  18   132/92 mmHg  97     Constitutional: Alert and oriented. Appears mildly uncomfortable Eyes: Conjunctivae are normal. PERRL. Normal extraocular movements. ENT   Head: Normocephalic and atraumatic.   Nose: No congestion/rhinnorhea.   Mouth/Throat: Mucous membranes are moist.   Neck: No  stridor. Hematological/Lymphatic/Immunilogical: No cervical lymphadenopathy. Cardiovascular: Normal rate, regular rhythm.  No murmurs, rubs, or gallops. Respiratory: Normal respiratory effort without tachypnea nor retractions. Breath sounds are clear and equal bilaterally. No wheezes/rales/rhonchi. Gastrointestinal: Soft and minimally tender to palpation left upper quadrant. No rebound. No guarding. No CVA tenderness. Genitourinary: Deferred Musculoskeletal: Normal range of motion in all extremities. No joint effusions.  No lower extremity tenderness nor edema. Neurologic:  Normal speech and language. No gross focal neurologic deficits are appreciated.  Skin:  Skin is warm, dry and intact. No rash noted. Psychiatric: Mood and affect are normal. Speech and behavior are normal. Patient exhibits appropriate insight and judgment.  ____________________________________________    LABS (pertinent positives/negatives)  Labs Reviewed  URINALYSIS COMPLETEWITH MICROSCOPIC ([facility] ONLY) - Abnormal; Notable for the following:    Color, Urine YELLOW (*)    APPearance CLEAR (*)    Squamous Epithelial / LPF 0-5 (*)    All other components within normal limits     ____________________________________________   EKG  None  ____________________________________________    RADIOLOGY  KUB  IMPRESSION: No plain film findings for an acute abdominal process.  Stable lower pole left renal calculus.  Bilateral hip AVN. ____________________________________________   PROCEDURES  Procedure(s) performed: None  Critical Care performed: No  ____________________________________________   INITIAL IMPRESSION / ASSESSMENT AND PLAN / ED COURSE  Pertinent labs & imaging results that were available during my care of the patient were reviewed by me and considered in my medical decision making (see chart for details).  Patient presented to the emergency department today because of concerns for  left-sided abdominal pain. She thinks it might be due to a kidney stone however urine and KUB today would not necessarily support this. On exam patient did not have any significant tenderness to palpation. She did have blood work and a CT scan performed last week. Because of the recent imaging that did not show any obvious etiology of pain I do not feel repeat imaging warranted at this time. Patient did leave the emergency department prior to my preparation of discharge paperwork.  ____________________________________________   FINAL CLINICAL IMPRESSION(S) / ED DIAGNOSES  Final diagnoses:  Left sided abdominal pain     Phineas SemenGraydon Sofia Jaquith, MD 04/16/15 [id number]

## 2015-04-16 NOTE — Telephone Encounter (Signed)
Pt called c/o of being in a lot of pain. Pt states she is out of pain medication and request another script. Please advise.

## 2015-04-17 ENCOUNTER — Telehealth: Payer: Self-pay

## 2015-04-17 LAB — CULTURE, URINE COMPREHENSIVE

## 2015-04-17 NOTE — Telephone Encounter (Signed)
Pt called stating she did not understand what a urologist is and wanted to know why we didn't think her pain was her kidney stone. Nurse reinforced with pt what organs a urologist covers. Also reinforced with pt that at the ED pt saw a hospitalist and she needs to be evaluated by a urologist in reference to her pain. Pt voiced understanding stating she does not have a ride today but does have an appt with her PCP Monday. Nurse reinforced with pt to keep appt with PCP. Pt voiced understanding.

## 2015-04-17 NOTE — Telephone Encounter (Signed)
Spoke with pt in reference to pain medication. Offered pt an appt today to see Lillia AbedLindsay. Pt stated she is not sure what to do as she does not have a ride. Pt stated she would call back if she was able to come for an appt.

## 2015-04-17 NOTE — Telephone Encounter (Signed)
Spoke with pt and made aware not able to give any more pain medication. Pt stated the pain was so severe last night she was in the ER. A KUB was done. Pt stated pain medication was given at the ER but no script was given. Pt asked "then why am I in so much pain if its not the stone". Please advise.

## 2015-04-17 NOTE — Telephone Encounter (Signed)
From the ED note, it looks like her KUB was negative for obstructing stone and the ED doc did not feel that this was the source of her pain.  Unfortunately, she left prior to being discharged from the ED.    She can be seen in the clinic and reevaluated but not comfortable giving her pain medications.    Perhaps see if she can be worked into [facility] schedule (I've never actually seen her before).    Vanna ScotlandAshley Gita Dilger, MD

## 2015-04-30 ENCOUNTER — Telehealth: Payer: Self-pay | Admitting: Radiology

## 2015-04-30 ENCOUNTER — Inpatient Hospital Stay: Admission: RE | Admit: 2015-04-30 | Payer: Self-pay | Source: Ambulatory Visit

## 2015-04-30 NOTE — Telephone Encounter (Signed)
Pt notified that she missed her pre-admit appt on 04/30/15 & it has been r/s as a phone interview on 05/02/15 between 9am-1pm. Pt voices understanding.

## 2015-05-02 ENCOUNTER — Other Ambulatory Visit: Payer: Self-pay

## 2015-05-02 ENCOUNTER — Encounter: Payer: Self-pay | Admitting: *Deleted

## 2015-05-02 NOTE — Patient Instructions (Addendum)
  Your procedure is scheduled on: 05-07-15 Report to [facility] 2ND FLOOR To find out your arrival time please call [phone] between 1PM - 3PM on 05-06-15  Remember: Instructions that are not followed completely may result in serious medical risk, up to and including death, or upon the discretion of your surgeon and anesthesiologist your surgery may need to be rescheduled.    _X___ 1. Do not eat food or drink liquids after midnight. No gum chewing or hard candies.     _X___ 2. No Alcohol for 24 hours before or after surgery.   ____ 3. Bring all medications with you on the day of surgery if instructed.    _X___ 4. Notify your doctor if there is any change in your medical condition     (cold, fever, infections).     Do not wear jewelry, make-up, hairpins, clips or nail polish.  Do not wear lotions, powders, or perfumes. You may wear deodorant.  Do not shave 48 hours prior to surgery. Men may shave face and neck.  Do not bring valuables to the hospital.    [facility] any belongings or valuables.               Contacts, dentures or bridgework may not be worn into surgery.  Leave your suitcase in the car. After surgery it may be brought to your room.  For patients admitted to the hospital, discharge time is determined by your treatment team.   Patients discharged the day of surgery will not be allowed to drive home.   Please read over the following fact sheets that you were given:     _X___ Take these medicines the morning of surgery with A SIP OF WATER:    1. CLONAZEPAM  2. OMEPRAZOLE  3.   4.  5.  6.  ____ Fleet Enema (as directed)   ____ Use CHG Soap as directed  _X___ Use inhalers on the day of surgery-USE ALBUTEROL INHALER AT HOME AND BRING TO THE HOSPITAL  ____ Stop metformin 2 days prior to surgery    ____ Take 1/2 of usual insulin dose the night before surgery and none on the morning of surgery.   ____ Stop  Coumadin/Plavix/aspirin-N/A  ____ Stop Anti-inflammatories-NO NSAIDS OR ASA PRODUCTS-TYLENOL OK TAKE   ____ Stop supplements until after surgery.    ____ Bring C-Pap to the hospital.

## 2015-05-07 ENCOUNTER — Ambulatory Visit: Admission: RE | Admit: 2015-05-07 | Payer: Medicaid Other | Source: Ambulatory Visit | Admitting: Urology

## 2015-05-07 HISTORY — DX: Pneumonia, unspecified organism: J18.9

## 2015-05-07 SURGERY — CYSTOSCOPY, WITH RETROGRADE PYELOGRAM
Anesthesia: Choice | Laterality: Bilateral

## 2015-05-13 ENCOUNTER — Telehealth: Payer: Self-pay | Admitting: Radiology

## 2015-05-13 DIAGNOSIS — R3129 Other microscopic hematuria: Secondary | ICD-10-CM

## 2015-05-13 NOTE — Telephone Encounter (Signed)
Advised pt that it is recommended that she be scheduled for a cystoscopy and CT Urogram to complete hematuria work up. She is aware that microscopic hematuria can be caused by a number of factors including potentially life threatening cancer & declines further workup at this time.

## 2015-05-13 NOTE — Telephone Encounter (Signed)
She does not need to proceed with surgery if she does not want to and her pain has resolved.  She did however have microscopic hematuria on her UA which could be from her stone but can also be a sign of GU cancers.  She is a smoker which puts her at increased risk.  I recommend that she be scheduled for a cystoscopy for completion of the recommended hematuria work up or a potentially life threatening cancer could go undiagnosed.  thanks

## 2015-05-13 NOTE — Telephone Encounter (Signed)
Pt was contacted to r/s surgery with Dr Apolinar JunesBrandon. Pt states that [facility] where she was advised that she had a "GI tract problem" that was causing her pain & that the kidney stone in question had "been there for 7 years and hasn't moved". Pt states her pain has resolved, she doesn't want to proceed with surgery at this time & will call back if pain returns.

## 2015-05-13 NOTE — Telephone Encounter (Signed)
Agree that her stone is unlikely to be cause of her pain.  My concern is the microscopic hematuria (blood in urine) which was going to be worked up during surgery via cytoscopy to look at the bladder and bilateral retrogrades to evaluate the ureters.    If she does not want to have surgery (which is totally fine), I would recommend follow up with CT Urogram and office cystoscopy to be sure that we are not missing a bladder tumor/ ureteral tumor or other very serious pathology.    Order to CT urogram placed and please schedule office cystoscopy.    Vanna ScotlandAshley Dorse Locy, MD

## 2015-08-19 ENCOUNTER — Encounter: Admission: RE | Payer: Self-pay | Source: Ambulatory Visit

## 2015-08-19 ENCOUNTER — Ambulatory Visit: Admission: RE | Admit: 2015-08-19 | Payer: Medicaid Other | Source: Ambulatory Visit | Admitting: Gastroenterology

## 2015-08-19 SURGERY — ESOPHAGOGASTRODUODENOSCOPY (EGD) WITH PROPOFOL
Anesthesia: General

## 2015-08-21 ENCOUNTER — Other Ambulatory Visit: Payer: Self-pay

## 2015-08-21 ENCOUNTER — Emergency Department
Admission: EM | Admit: 2015-08-21 | Discharge: 2015-08-21 | Disposition: A | Discharge status: Home / Self Care | Payer: Medicaid Other | Attending: Emergency Medicine | Admitting: Emergency Medicine

## 2015-08-21 ENCOUNTER — Encounter: Payer: Self-pay | Admitting: Emergency Medicine

## 2015-08-21 DIAGNOSIS — M87059 Idiopathic aseptic necrosis of unspecified femur: Secondary | ICD-10-CM

## 2015-08-21 DIAGNOSIS — K760 Fatty (change of) liver, not elsewhere classified: Secondary | ICD-10-CM

## 2015-08-21 DIAGNOSIS — M879 Osteonecrosis, unspecified: Secondary | ICD-10-CM | POA: Insufficient documentation

## 2015-08-21 DIAGNOSIS — F17210 Nicotine dependence, cigarettes, uncomplicated: Secondary | ICD-10-CM | POA: Insufficient documentation

## 2015-08-21 DIAGNOSIS — J45909 Unspecified asthma, uncomplicated: Secondary | ICD-10-CM | POA: Diagnosis not present

## 2015-08-21 DIAGNOSIS — R1012 Left upper quadrant pain: Secondary | ICD-10-CM | POA: Diagnosis present

## 2015-08-21 LAB — CBC WITH DIFFERENTIAL/PLATELET
BASOS ABS: 0.1 10*3/uL (ref 0–0.1)
Basophils Relative: 1 %
Eosinophils Absolute: 0.2 10*3/uL (ref 0–0.7)
Eosinophils Relative: 3 %
HEMATOCRIT: 38.5 % (ref 35.0–47.0)
Hemoglobin: 12.9 g/dL (ref 12.0–16.0)
LYMPHS ABS: 1.6 10*3/uL (ref 1.0–3.6)
LYMPHS PCT: 21 %
MCH: 29.4 pg (ref 26.0–34.0)
MCHC: 33.4 g/dL (ref 32.0–36.0)
MCV: 87.9 fL (ref 80.0–100.0)
MONOS PCT: 3 %
Monocytes Absolute: 0.2 10*3/uL (ref 0.2–0.9)
NEUTROS ABS: 5.3 10*3/uL (ref 1.4–6.5)
Neutrophils Relative %: 72 %
Platelets: 307 10*3/uL (ref 150–440)
RBC: 4.38 MIL/uL (ref 3.80–5.20)
RDW: 15.9 % — AB (ref 11.5–14.5)
WBC: 7.4 10*3/uL (ref 3.6–11.0)

## 2015-08-21 LAB — URINALYSIS COMPLETE WITH MICROSCOPIC (ARMC ONLY)
BILIRUBIN URINE: NEGATIVE
Glucose, UA: NEGATIVE mg/dL
HGB URINE DIPSTICK: NEGATIVE
Ketones, ur: NEGATIVE mg/dL
LEUKOCYTES UA: NEGATIVE
NITRITE: NEGATIVE
PH: 6 (ref 5.0–8.0)
Protein, ur: NEGATIVE mg/dL
RBC / HPF: NONE SEEN RBC/hpf (ref 0–5)
Specific Gravity, Urine: 1.004 — ABNORMAL LOW (ref 1.005–1.030)
WBC, UA: NONE SEEN WBC/hpf (ref 0–5)

## 2015-08-21 LAB — COMPREHENSIVE METABOLIC PANEL
ALT: 9 U/L — AB (ref 14–54)
AST: 21 U/L (ref 15–41)
Albumin: 4.2 g/dL (ref 3.5–5.0)
Alkaline Phosphatase: 99 U/L (ref 38–126)
Anion gap: 10 (ref 5–15)
BUN: 10 mg/dL (ref 6–20)
CHLORIDE: 108 mmol/L (ref 101–111)
CO2: 21 mmol/L — AB (ref 22–32)
CREATININE: 0.8 mg/dL (ref 0.44–1.00)
Calcium: 9 mg/dL (ref 8.9–10.3)
GFR calc Af Amer: >60 mL/min (ref >60)
GFR calc non Af Amer: >60 mL/min (ref >60)
GLUCOSE: 139 mg/dL — AB (ref 65–99)
Potassium: 3.9 mmol/L (ref 3.5–5.1)
SODIUM: 139 mmol/L (ref 135–145)
Total Bilirubin: 0.6 mg/dL (ref 0.3–1.2)
Total Protein: 7.2 g/dL (ref 6.5–8.1)

## 2015-08-21 LAB — LACTIC ACID, PLASMA: Lactic Acid, Venous: 1.6 mmol/L (ref 0.5–2.0)

## 2015-08-21 LAB — URINE DRUG SCREEN, QUALITATIVE (ARMC ONLY)
AMPHETAMINES, UR SCREEN: NOT DETECTED
BENZODIAZEPINE, UR SCRN: POSITIVE — AB
Barbiturates, Ur Screen: NOT DETECTED
Cannabinoid 50 Ng, Ur ~~LOC~~: POSITIVE — AB
Cocaine Metabolite,Ur ~~LOC~~: NOT DETECTED
MDMA (ECSTASY) UR SCREEN: NOT DETECTED
METHADONE SCREEN, URINE: NOT DETECTED
Opiate, Ur Screen: NOT DETECTED
Phencyclidine (PCP) Ur S: NOT DETECTED
Tricyclic, Ur Screen: NOT DETECTED

## 2015-08-21 LAB — LIPASE, BLOOD: Lipase: 14 U/L (ref 11–51)

## 2015-08-21 MED ORDER — GI COCKTAIL ~~LOC~~
30.0000 mL | Freq: Once | ORAL | Status: AC
  Administered 2015-08-21: 30 mL via ORAL
  Filled 2015-08-21: qty 30

## 2015-08-21 MED ORDER — TRAMADOL HCL 50 MG PO TABS: 50.0000 mg | ORAL_TABLET | Freq: Four times a day (QID) | ORAL | Status: DC | PRN

## 2015-08-21 MED ORDER — ONDANSETRON HCL 4 MG/2ML IJ SOLN
4.0000 mg | Freq: Once | INTRAMUSCULAR | Status: AC
  Administered 2015-08-21: 4 mg via INTRAVENOUS
  Filled 2015-08-21: qty 2

## 2015-08-21 MED ORDER — FENTANYL CITRATE (PF) 100 MCG/2ML IJ SOLN
75.0000 ug | Freq: Once | INTRAMUSCULAR | Status: AC
  Administered 2015-08-21: 75 ug via INTRAVENOUS
  Filled 2015-08-21: qty 2

## 2015-08-21 MED ORDER — SODIUM CHLORIDE 0.9 % IV BOLUS (SEPSIS)
1000.0000 mL | Freq: Once | INTRAVENOUS | Status: AC
  Administered 2015-08-21: 1000 mL via INTRAVENOUS

## 2015-08-21 NOTE — Discharge Instructions (Signed)
It is extremely important that he follow up with your GI doctor to reschedule your endoscopy for further evaluation. Be sure you are taking your Carafate on an empty stomach with water. All other medication should be taken 2 hours before.  Return to the ER for new or worsening pain, vomiting, fever, or for any other concerns. Be sure your doctors know that you have had multiple CT scans in the past as further CT scans increase your long-term risk of cancer.  Follow-up with orthopedics for the abnormalities seen in your hips (avascular necrosis) on your CT scan December.   Abdominal Pain, Adult Many things can cause abdominal pain. Usually, abdominal pain is not caused by a disease and will improve without treatment. It can often be observed and treated at home. Your health care provider will do a physical exam and possibly order blood tests and X-rays to help determine the seriousness of your pain. However, in many cases, more time must pass before a clear cause of the pain can be found. Before that point, your health care provider may not know if you need more testing or further treatment. HOME CARE INSTRUCTIONS Monitor your abdominal pain for any changes. The following actions may help to alleviate any discomfort you are experiencing:  Only take over-the-counter or prescription medicines as directed by your health care provider.  Do not take laxatives unless directed to do so by your health care provider.  Try a clear liquid diet (broth, tea, or water) as directed by your health care provider. Slowly move to a bland diet as tolerated. SEEK MEDICAL CARE IF:  You have unexplained abdominal pain.  You have abdominal pain associated with nausea or diarrhea.  You have pain when you urinate or have a bowel movement.  You experience abdominal pain that wakes you in the night.  You have abdominal pain that is worsened or improved by eating food.  You have abdominal pain that is worsened with  eating fatty foods.  You have a fever. SEEK IMMEDIATE MEDICAL CARE IF:  Your pain does not go away within 2 hours.  You keep throwing up (vomiting).  Your pain is felt only in portions of the abdomen, such as the right side or the left lower portion of the abdomen.  You pass bloody or black tarry stools. MAKE SURE YOU:  Understand these instructions.  Will watch your condition.  Will get help right away if you are not doing well or get worse.   This information is not intended to replace advice given to you by your health care provider. Make sure you discuss any questions you have with your health care provider.   Document Released: 01/21/2005 Document Revised: 01/02/2015 Document Reviewed: 12/21/2012 Elsevier Interactive Patient Education 2016 Elsevier Inc.  Avascular Necrosis Avascular necrosis is a disease resulting from the temporary or permanent loss of blood supply to a bone. This disease may also be known as:   Osteonecrosis.   Aseptic necrosis.   Ischemic bone necrosis. Without proper blood supply, the internal layer of the affected bone dies and the outer layer of the bone may break down. If this process affects a bone near a joint, it may lead to collapse of that joint. Common bones that are affected by this condition include:  The top of your thigh bone (femoral head).  One or more bones in your wrist (scaphoid orlunate).  One or more bones in your foot (metatarsals).  One of the bones in your ankle (navicular). The  joint most commonly affected by this condition is the hip joint. Avascular necrosis rarely occurs in more than one bone at a time.  CAUSES   Damage or injury to a bone or joint.  Using corticosteroid medicine for a long period of time.  Changes in your immune or hormone systems.  Excessive exposure to radiation. RISK FACTORS  Alcohol abuse.  Previous traumatic injury to a joint.  Using corticosteroid medicines for a long period  of time or often.  Having a medical condition such as:  HIV or AIDS.   Diabetes.   Sickle cell disease.  An autoimmune disease. SIGNS AND SYMPTOMS  The main symptoms of avascular necrosis are pain and decreased motion in the affected bone or joint. In the early stages the pain may be minor and occur only with activity. As avascular necrosis progresses, pain may gradually worsen and occur while at rest. The pain may suddenly become severe if an affected joint collapses. DIAGNOSIS  Avascular necrosis may be diagnosed with:   A medical history.  A physical exam.   X-rays.  An MRI.  A bone scan. TREATMENT  Treatments may include:  Medicine to help relieve pain.  Avoiding placing any pressure or weight ontheaffected area. If avascular necrosis occurs in your hip, ankle, or foot, you may be instructed to use crutches or a rolling scooter.  Surgery, such as:   Core decompression. In this surgery, one or more holes are placed in the bone for new blood vessels to grow into. This provides a renewed blood supply to the bone. Core decompression can often reduce pain and pressure in the affected bone and slow the progression of bone and joint destruction.  Osteotomy. In this surgery, the bone is reshaped to reduce stress on the affected area of the joint.   Bone grafting. In this surgery, healthy bone from one part of your body is transplanted to the affected area.   Arthroplasty. Arthroplasty is also known as total joint replacement. In this surgery, the affected surface on one or both sides of a joint is replaced with artificial parts (prostheses).  Electrical stimulation. This may help encourage new bone growth. HOME CARE INSTRUCTIONS  Take medicines only as directed by your health care provider.   Follow your health care provider's recommendations on limiting activities or using crutches to rest your affected joint.   Meet with aphysical therapist as directed by  your health care provider.   Keep all follow-up visits as directed by your health care provider. This is important. SEEK MEDICAL CARE IF:   Your pain worsens.  You have decreased motion in your affected joint. SEEK IMMEDIATE MEDICAL CARE IF:  Your pain suddenly becomes severe.   This information is not intended to replace advice given to you by your health care provider. Make sure you discuss any questions you have with your health care provider.   Document Released: 10/03/2001 Document Revised: 05/04/2014 Document Reviewed: 06/21/2013 Elsevier Interactive Patient Education 2016 Elsevier Inc.  Fatty Liver Fatty liver, also called hepatic steatosis or steatohepatitis, is a condition in which too much fat has built up in your liver cells. The liver removes harmful substances from your bloodstream. It produces fluids your body needs. It also helps your body use and store energy from the food you eat. In many cases, fatty liver does not cause symptoms or problems. It is often diagnosed when tests are being done for other reasons. However, over time, fatty liver can cause inflammation that may lead  to more serious liver problems, such as scarring of the liver (cirrhosis). CAUSES  Causes of fatty liver may include:   Drinking too much alcohol.  Poor nutrition.  Obesity.  Cushing syndrome.  Diabetes.  Hyperlipidemia.  Pregnancy.  Certain drugs.  Poisons.  Some viral infections. RISK FACTORS You may be more likely to develop fatty liver if you:  Abuse alcohol.  Are pregnant.  Are overweight.  Have diabetes.  Have hepatitis.  Have a high triglyceride level.  SIGNS AND SYMPTOMS  Fatty liver often does not cause any symptoms. In cases where symptoms develop, they can include:  Fatigue.  Weakness.  Weight loss.  Confusion.   Abdominal pain.  Yellowing of your skin and the white parts of your eyes (jaundice).  Nausea and vomiting. DIAGNOSIS  Fatty liver  may be diagnosed by:   Physical exam and medical history.  Blood tests.  Imaging tests, such as an ultrasound, CT scan, or MRI.  Liver biopsy. A small sample of liver tissue is removed using a needle. The sample is then looked at under a microscope. TREATMENT  Fatty liver is often caused by other health conditions. Treatment for fatty liver may involve medicines and lifestyle changes to manage conditions such as:   Alcoholism.  High cholesterol.  Diabetes.  Being overweight or obese.  HOME CARE INSTRUCTIONS  Eat a healthy diet as directed by your health care provider.  Exercise regularly. This can help you lose weight and control your cholesterol and diabetes. Talk to your health care provider about an exercise plan and which activities are best for you.  Do not drink alcohol.   Take medicines only as directed by your health care provider. SEEK MEDICAL CARE IF: You have difficulty controlling your:  Blood sugar.  Cholesterol.  Alcohol consumption. SEEK IMMEDIATE MEDICAL CARE IF:  You have abdominal pain.  You have jaundice.  You have nausea and vomiting.   This information is not intended to replace advice given to you by your health care provider. Make sure you discuss any questions you have with your health care provider.   Document Released: 05/29/2005 Document Revised: 05/04/2014 Document Reviewed: 08/23/2013 Elsevier Interactive Patient Education [employer] Elsevier Inc.

## 2015-08-21 NOTE — ED Notes (Signed)
Pt ems from home for upper left abd pain that has been going on for 6 months. For past 3 days pt has been unable to eat or drink and had increasing weakness. Called MD today and was told to come here.

## 2015-08-21 NOTE — ED Provider Notes (Signed)
[facility] Emergency Department Provider Note ____________________________________________  Time seen: Approximately 3:53 PM  I have reviewed the triage vital signs and the nursing notes.   HISTORY  Chief Complaint Abdominal Pain   HPI Shawna [name] is a 50 y.o. female who presents with a 6 month history of left sided abdominal pain under her ribcage. She states the pain is constant; it eases at times but it never resolves. She states it is worse immediately while eating and afterwards. She states it feels like something is "swelling and pushing "out of her abdomen. It is also worse when she lays down. She states she's been very uncomfortable laying down because it hurts if she lays on her back or left side and her right sided shoulder replacement bothers her if she lays on her right side. She has had some nausea which is controlled with Phenergan. She has not been vomiting. She states her weight fluctuates up and down but is generally stable. She denies any night sweats. Her bowel movements are normal. She is taking a probiotic and also Carafate 4 times a day. She just received her third bottle of Carafate. She was scheduled to have an upper endoscopy 2 days ago but canceled it because she didn't have anybody to go with her.  She had a CT abdomen pelvis for the same symptoms 04/09/15 that showed fatty liver, stable bilateral kidney stones, and bilateral sclerosis of the femoral heads possibly consistent with avascular necrosis. She has had numerous CTs in the past as well for similar symptoms. 03/05/14-NAD, 02/06/12-thickened antrum, o/w NAD, 06/26/11-duodenitis, 11/10/09.    Past Medical History  Diagnosis Date  . Sciatica of right side   . Bulging lumbar disc   . Insomnia   . Sciatic leg pain     Right side  . Weakness     Bil knees, and ankles  . Bulging lumbar disc   . Asthma   . Kidney stone   . Pneumonia DEC 2016    Patient Active Problem List   Diagnosis  Date Noted  . Bacterial pneumonia 01/23/2015  . Leukocytosis 01/23/2015  . Sepsis (HCC) 01/23/2015  . Hypoxemia 01/23/2015  . Opioid use disorder, moderate, dependence (HCC) 12/31/2014  . Anxiety disorder, unspecified 12/31/2014    Past Surgical History  Procedure Laterality Date  . Tubal ligation    . Abdominal hysterectomy      Partial  . Tonsillectomy    . Shoulder surgery Right     Current Outpatient Rx  Name  Route  Sig  Dispense  Refill  . albuterol (PROVENTIL HFA;VENTOLIN HFA) 108 (90 BASE) MCG/ACT inhaler   Inhalation   Inhale 2 puffs into the lungs every 6 (six) hours as needed for wheezing or shortness of breath.         . clonazePAM (KLONOPIN) 0.5 MG tablet   Oral   Take 0.5 mg by mouth at bedtime.         . clonazePAM (KLONOPIN) 1 MG tablet   Oral   Take 1 mg by mouth 2 (two) times daily.         . hydrOXYzine (ATARAX/VISTARIL) 50 MG tablet   Oral   Take 50-100 mg by mouth 5 (five) times daily. Pt takes one tablet every 6 hours and two tablets at bedtime.         Marland Kitchen. loratadine (CLARITIN) 10 MG tablet   Oral   Take 10 mg by mouth daily.          .Marland Kitchen  omeprazole (PRILOSEC) 40 MG capsule   Oral   Take 40 mg by mouth at bedtime.          . ondansetron (ZOFRAN ODT) 4 MG disintegrating tablet   Oral   Take 1 tablet (4 mg total) by mouth every 8 (eight) hours as needed for nausea or vomiting.   20 tablet   0   . traMADol (ULTRAM) 50 MG tablet   Oral   Take 1 tablet (50 mg total) by mouth every 6 (six) hours as needed for severe pain.   10 tablet   0   . zolpidem (AMBIEN) 10 MG tablet   Oral   Take 10 mg by mouth at bedtime.           Allergies Aspirin; Morphine and related; Nsaids; and Vicodin  Family History  Problem Relation Age of Onset  . Fibromyalgia Mother     Social History Social History  Substance Use Topics  . Smoking status: Current Every Day Smoker -- 1.00 packs/day for 40 years    Types: Cigarettes  . Smokeless  tobacco: Never Used  . Alcohol Use: No  lives alone; has never been a heavy drinker; on disability  Review of Systems Constitutional: No fever/chills Cardiovascular: Denies chest pain. Respiratory: Denies shortness of breath. Gastrointestinal: see hpi. Musculoskeletal: Negative for back pain. 10-point ROS otherwise negative.  ____________________________________________   PHYSICAL EXAM:  VITAL SIGNS: 121/82,78,98%/RA, 97.9deg F; RR-19  Filed Vitals:   08/21/15 1715 08/21/15 1800  BP:  113/75  Pulse: 74   Temp:    Resp: 10 11    Constitutional: Alert and oriented. Well appearing and in no acute distress. Eyes: Conjunctivae are normal. PERRL. EOMI. Head: Atraumatic. Nose: No congestion/rhinnorhea. Mouth/Throat: Mucous membranes are moist.  Oropharynx non-erythematous. Neck: No stridor.   Cardiovascular: Normal rate, regular rhythm. Grossly normal heart sounds.  Good peripheral circulation. Respiratory: Normal respiratory effort.  No retractions. Lungs CTAB. Gastrointestinal: Soft and nontender with distraction. No distention. No abdominal bruits. No CVA tenderness. Musculoskeletal: No lower extremity tenderness nor edema.   Neurologic:  Normal speech and language. No gross focal neurologic deficits are appreciated. No gait instability. Skin:  Skin is warm, dry and intact. No rash noted. Psychiatric: Mood and affect are normal. Speech and behavior are normal.  ____________________________________________   LABS (all labs ordered are listed, but only abnormal results are displayed)  Labs Reviewed  URINALYSIS COMPLETEWITH MICROSCOPIC ([facility] ONLY) - Abnormal; Notable for the following:    Color, Urine STRAW (*)    APPearance HAZY (*)    Specific Gravity, Urine 1.004 (*)    Bacteria, UA RARE (*)    Squamous Epithelial / LPF 6-30 (*)    All other components within normal limits  URINE DRUG SCREEN, QUALITATIVE ([facility] ONLY) - Abnormal; Notable for the following:     Cannabinoid 50 Ng, Ur [place] POSITIVE (*)    Benzodiazepine, Ur Scrn POSITIVE (*)    All other components within normal limits  COMPREHENSIVE METABOLIC PANEL - Abnormal; Notable for the following:    CO2 21 (*)    Glucose, Bld 139 (*)    ALT 9 (*)    All other components within normal limits  CBC WITH DIFFERENTIAL/PLATELET - Abnormal; Notable for the following:    RDW 15.9 (*)    All other components within normal limits  LIPASE, BLOOD  LACTIC ACID, PLASMA   ____________________________________________  EKG  ED ECG REPORT I, Maurilio Lovely, the attending physician, personally viewed and  interpreted this ECG.   Date: 08/21/2015  EKG Time: 1548  Rate: 75  Rhythm: nsr   Axis: nl  Intervals:none  ST&T Change: nl  ____________________________________________   INITIAL IMPRESSION / ASSESSMENT AND PLAN / ED COURSE  Pertinent labs & imaging results that were available during my care of the patient were reviewed by me and considered in my medical decision making (see chart for details).  Shawna [name] has had numerous CT scans with similar symptoms. I am concerned about increasing her cancer risk with radiation exposure given that she does not have any pain to palpation when distracted on exam. I extensively discussed with her the importance of following up with GI.  ----------------------------------------- 5:51 PM on 08/21/2015 -----------------------------------------  Pt has gotten in touch with a cousin who she thinks can help drive her to her appointments. She states she will follow up with GI in the morning. I also advised her to talk with her PCP.  She is requesting something for pain at discharge. I will give her a small dose of tramadol.   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  Whole family ER at 1  Maurilio Lovely, MD 08/21/15 1811

## 2015-09-19 ENCOUNTER — Emergency Department
Admission: EM | Admit: 2015-09-19 | Discharge: 2015-09-20 | Disposition: A | Discharge status: Home / Self Care | Payer: Medicaid Other | Attending: Emergency Medicine | Admitting: Emergency Medicine

## 2015-09-19 DIAGNOSIS — Z79899 Other long term (current) drug therapy: Secondary | ICD-10-CM | POA: Diagnosis not present

## 2015-09-19 DIAGNOSIS — J45909 Unspecified asthma, uncomplicated: Secondary | ICD-10-CM | POA: Insufficient documentation

## 2015-09-19 DIAGNOSIS — F17210 Nicotine dependence, cigarettes, uncomplicated: Secondary | ICD-10-CM | POA: Insufficient documentation

## 2015-09-19 DIAGNOSIS — R1012 Left upper quadrant pain: Secondary | ICD-10-CM | POA: Insufficient documentation

## 2015-09-19 LAB — COMPREHENSIVE METABOLIC PANEL
ALBUMIN: 4 g/dL (ref 3.5–5.0)
ALT: 10 U/L — ABNORMAL LOW (ref 14–54)
AST: 16 U/L (ref 15–41)
Alkaline Phosphatase: 84 U/L (ref 38–126)
Anion gap: 8 (ref 5–15)
BILIRUBIN TOTAL: 0.3 mg/dL (ref 0.3–1.2)
BUN: 19 mg/dL (ref 6–20)
CO2: 22 mmol/L (ref 22–32)
Calcium: 9 mg/dL (ref 8.9–10.3)
Chloride: 109 mmol/L (ref 101–111)
Creatinine, Ser: 0.87 mg/dL (ref 0.44–1.00)
GFR calc Af Amer: >60 mL/min (ref >60)
GLUCOSE: 133 mg/dL — AB (ref 65–99)
Potassium: 3.4 mmol/L — ABNORMAL LOW (ref 3.5–5.1)
Sodium: 139 mmol/L (ref 135–145)
TOTAL PROTEIN: 7 g/dL (ref 6.5–8.1)

## 2015-09-19 LAB — LIPASE, BLOOD: Lipase: 31 U/L (ref 11–51)

## 2015-09-19 LAB — URINALYSIS COMPLETE WITH MICROSCOPIC (ARMC ONLY)
BILIRUBIN URINE: NEGATIVE
Glucose, UA: NEGATIVE mg/dL
HGB URINE DIPSTICK: NEGATIVE
KETONES UR: NEGATIVE mg/dL
NITRITE: NEGATIVE
PH: 6 (ref 5.0–8.0)
PROTEIN: NEGATIVE mg/dL
Specific Gravity, Urine: 1.026 (ref 1.005–1.030)

## 2015-09-19 LAB — CBC
HCT: 40.3 % (ref 35.0–47.0)
HEMOGLOBIN: 13.5 g/dL (ref 12.0–16.0)
MCH: 29.3 pg (ref 26.0–34.0)
MCHC: 33.6 g/dL (ref 32.0–36.0)
MCV: 87.2 fL (ref 80.0–100.0)
PLATELETS: 325 10*3/uL (ref 150–440)
RBC: 4.62 MIL/uL (ref 3.80–5.20)
RDW: 15.3 % — ABNORMAL HIGH (ref 11.5–14.5)
WBC: 8.6 10*3/uL (ref 3.6–11.0)

## 2015-09-19 NOTE — ED Notes (Signed)
Pt brought in by [facility] from home LUQ pain for 8 months has seen GI for the same.  Co nausea and chills, denies fever.

## 2015-09-19 NOTE — ED Notes (Signed)
Patient to ED via EMS for left upper quadrant/rib cage pain x 8 months. States today she "just can't take the pain anymore". States she was told she can't have any more CT scans done. States she can't take the pain anymore because she has been sick so long.

## 2015-09-20 LAB — TROPONIN I

## 2015-09-20 MED ORDER — TRAMADOL HCL 50 MG PO TABS
50.0000 mg | ORAL_TABLET | Freq: Once | ORAL | Status: AC
  Administered 2015-09-20: 50 mg via ORAL
  Filled 2015-09-20: qty 1

## 2015-09-20 MED ORDER — KETOROLAC TROMETHAMINE 60 MG/2ML IM SOLN
60.0000 mg | Freq: Once | INTRAMUSCULAR | Status: AC
  Administered 2015-09-20: 60 mg via INTRAMUSCULAR
  Filled 2015-09-20: qty 2

## 2015-09-20 MED ORDER — GI COCKTAIL ~~LOC~~
30.0000 mL | Freq: Once | ORAL | Status: AC
  Administered 2015-09-20: 30 mL via ORAL
  Filled 2015-09-20: qty 30

## 2015-09-20 NOTE — ED Provider Notes (Signed)
[facility] Emergency Department Provider Note   ____________________________________________  Time seen: Approximately 2354 PM  I have reviewed the triage vital signs and the nursing notes.   HISTORY  Chief Complaint Abdominal Pain    HPI Shawna [name] is a 50 y.o. female who comes into the hospital today with abdominal pain. The patient reports that she has had pain in the left upper quadrant for 8 months. She reports that she has been seen multiple times but still does not know the cause of this pain. She was told the last time she was in the GI doctor's office that if the pain became worse to come into the ER. She reports that the pain seemed to be worse today. She was unable to sleep well last night and reports that she's been awake since 7 AM. She reports that no one can tell what's going on but she has not yet received her upper endoscopy. The patient is taking Protonix, Carafate and probiotics but nothing has made the pain better. She reports that nothing seems to make it worse but eating sometimes does make it worse. She was instructed to stop taking Goody powders and nonsteroidal anti-inflammatories and she reports that she has been doing less but not consistently. The patient reports her pain as a 10 out of 10 in intensity. She reports that she has nausea and upper abdominal pain. She is also had some constipation. She reports that she has been unable to sleep. She has some mild shortness of breath with no cough and mild dizziness. The patient is here today for evaluation and treatment of her symptoms.She reports that the pain is so bad and her abdomen that it is making her back hurt.   Past Medical History  Diagnosis Date  . Sciatica of right side   . Bulging lumbar disc   . Insomnia   . Sciatic leg pain     Right side  . Weakness     Bil knees, and ankles  . Bulging lumbar disc   . Asthma   . Kidney stone   . Pneumonia DEC 2016    Patient  Active Problem List   Diagnosis Date Noted  . Bacterial pneumonia 01/23/2015  . Leukocytosis 01/23/2015  . Sepsis (HCC) 01/23/2015  . Hypoxemia 01/23/2015  . Opioid use disorder, moderate, dependence (HCC) 12/31/2014  . Anxiety disorder, unspecified 12/31/2014    Past Surgical History  Procedure Laterality Date  . Tubal ligation    . Abdominal hysterectomy      Partial  . Tonsillectomy    . Shoulder surgery Right     Current Outpatient Rx  Name  Route  Sig  Dispense  Refill  . albuterol (PROVENTIL HFA;VENTOLIN HFA) 108 (90 BASE) MCG/ACT inhaler   Inhalation   Inhale 2 puffs into the lungs every 6 (six) hours as needed for wheezing or shortness of breath.         . diazepam (VALIUM) 5 MG tablet   Oral   Take 5 mg by mouth every 6 (six) hours as needed for anxiety.         . hydrOXYzine (ATARAX/VISTARIL) 50 MG tablet   Oral   Take 50-100 mg by mouth 5 (five) times daily. Pt takes one tablet every 6 hours and two tablets at bedtime.         Marland Kitchen loratadine (CLARITIN) 10 MG tablet   Oral   Take 10 mg by mouth daily.          Marland Kitchen  pantoprazole (PROTONIX) 40 MG tablet   Oral   Take 40 mg by mouth 2 (two) times daily before a meal.         . Probiotic Product (PROBIOTIC DAILY PO)   Oral   Take 1 tablet by mouth daily.         . promethazine (PHENERGAN) 25 MG tablet   Oral   Take 25 mg by mouth every 6 (six) hours as needed for nausea or vomiting.         . sucralfate (CARAFATE) 1 g tablet   Oral   Take 1 g by mouth 4 (four) times daily -  with meals and at bedtime.         Marland Kitchen zolpidem (AMBIEN) 10 MG tablet   Oral   Take 10 mg by mouth at bedtime.         . clonazePAM (KLONOPIN) 0.5 MG tablet   Oral   Take 0.5 mg by mouth at bedtime.         . clonazePAM (KLONOPIN) 1 MG tablet   Oral   Take 1 mg by mouth 2 (two) times daily.         Marland Kitchen omeprazole (PRILOSEC) 40 MG capsule   Oral   Take 40 mg by mouth at bedtime.          . ondansetron  (ZOFRAN ODT) 4 MG disintegrating tablet   Oral   Take 1 tablet (4 mg total) by mouth every 8 (eight) hours as needed for nausea or vomiting.   20 tablet   0   . traMADol (ULTRAM) 50 MG tablet   Oral   Take 1 tablet (50 mg total) by mouth every 6 (six) hours as needed for severe pain.   10 tablet   0     Allergies Morphine and related and Vicodin  Family History  Problem Relation Age of Onset  . Fibromyalgia Mother     Social History Social History  Substance Use Topics  . Smoking status: Current Every Day Smoker -- 1.00 packs/day for 40 years    Types: Cigarettes  . Smokeless tobacco: Never Used  . Alcohol Use: No    Review of Systems Constitutional: No fever/chills Eyes: No visual changes. ENT: No sore throat. Cardiovascular: Denies chest pain. Respiratory: Denies shortness of breath. Gastrointestinal:  abdominal pain.   nausea, no vomiting.  No diarrhea.  No constipation. Genitourinary: Negative for dysuria. Musculoskeletal: back pain. Skin: Negative for rash. Neurological: Negative for headaches, focal weakness or numbness.  10-point ROS otherwise negative.  ____________________________________________   PHYSICAL EXAM:  VITAL SIGNS: ED Triage Vitals  Enc Vitals Group     BP 09/19/15 2159 117/80 mmHg     Pulse Rate 09/19/15 2159 99     Resp 09/19/15 2159 20     Temp 09/19/15 2159 98.3 F (36.8 C)     Temp Source 09/19/15 2159 Oral     SpO2 09/19/15 2159 100 %     Weight 09/19/15 2159 158 lb (71.668 kg)     Height 09/19/15 2159 5\' 6"  (1.676 m)     Head Cir --      Peak Flow --      Pain Score 09/19/15 2200 10     Pain Loc --      Pain Edu? --      Excl. in GC? --     Constitutional: Alert and oriented. Well appearing and in Moderate distress. Eyes: Conjunctivae are normal. PERRL. EOMI. Head:  Atraumatic. Nose: No congestion/rhinnorhea. Mouth/Throat: Mucous membranes are moist.  Oropharynx non-erythematous. Cardiovascular: Normal rate, regular  rhythm. Grossly normal heart sounds.  Good peripheral circulation. Respiratory: Normal respiratory effort.  No retractions. Lungs CTAB. Gastrointestinal: Soft with left upper quadrant and epigastric tenderness to palpation. No distention. Positive bowel sounds. Musculoskeletal: No lower extremity tenderness nor edema.   Neurologic:  Normal speech and language.  Skin:  Skin is warm, dry and intact.  Psychiatric: Mood and affect are normal.   ____________________________________________   LABS (all labs ordered are listed, but only abnormal results are displayed)  Labs Reviewed  CBC - Abnormal; Notable for the following:    RDW 15.3 (*)    All other components within normal limits  URINALYSIS COMPLETEWITH MICROSCOPIC ([facility] ONLY) - Abnormal; Notable for the following:    Color, Urine YELLOW (*)    APPearance HAZY (*)    Leukocytes, UA TRACE (*)    Bacteria, UA RARE (*)    Squamous Epithelial / LPF 6-30 (*)    All other components within normal limits  COMPREHENSIVE METABOLIC PANEL - Abnormal; Notable for the following:    Potassium 3.4 (*)    Glucose, Bld 133 (*)    ALT 10 (*)    All other components within normal limits  LIPASE, BLOOD  TROPONIN I   ____________________________________________  EKG  ED ECG REPORT I, Rebecka ApleyWebster,  Mertice Uffelman P, the attending physician, personally viewed and interpreted this ECG.   Date: 09/20/2015  EKG Time: 0047  Rate: 70  Rhythm: normal sinus rhythm  Axis: normal  Intervals:none  ST&T Change: none  ____________________________________________  RADIOLOGY  none ____________________________________________   PROCEDURES  Procedure(s) performed: None  Critical Care performed: No  ____________________________________________   INITIAL IMPRESSION / ASSESSMENT AND PLAN / ED COURSE  Pertinent labs & imaging results that were available during my care of the patient were reviewed by me and considered in my medical decision making (see  chart for details).  This is a 50 year old female who comes into the hospital today with left upper quadrant pain. The patient has been having this pain for over 8 months. The patient had a CT scan back in December and she has been seen here as well as at her GI physicians office. The patient was supposed to have an endoscopy on April 24 but reports that she could not go due to transportation issues. The patient was seen by GI on May 9 and reports that she has another endoscopy scheduled for the end of June. She reports that she just is unable to tolerate the pain at this time. I will check a chest x-ray as well as an EKG and I will give the patient some tramadol as well as some GI cocktail. The patient will be reassessed when she's received her medication.  The patient reports that she is still having some pain after the tramadol and the GI cocktail. She reports that it didn't help. I ordered some Toradol for the patient. At this point the patient needs an endoscopy to evaluate the cause of her pain. I did explain this to the patient extensively. The patient has been seen multiple times and due to the quantity of CT scan she is obtained and it is felt that a CT scan would not help to identify the cause of the patient's pain. After the Toradol shot the patient reports that she is ready to go home. The patient will be discharged to follow-up with GI physician. ____________________________________________   FINAL  CLINICAL IMPRESSION(S) / ED DIAGNOSES  Final diagnoses:  Left upper quadrant pain      NEW MEDICATIONS STARTED DURING THIS VISIT:  Discharge Medication List as of 09/20/2015  1:51 AM       Note:  This document was prepared using Dragon voice recognition software and may include unintentional dictation errors.    Rebecka Apley, MD 09/20/15 [phone]

## 2015-09-20 NOTE — Discharge Instructions (Signed)
Abdominal Pain, Adult °Many things can cause abdominal pain. Usually, abdominal pain is not caused by a disease and will improve without treatment. It can often be observed and treated at home. Your health care provider will do a physical exam and possibly order blood tests and X-rays to help determine the seriousness of your pain. However, in many cases, more time must pass before a clear cause of the pain can be found. Before that point, your health care provider may not know if you need more testing or further treatment. °HOME CARE INSTRUCTIONS °Monitor your abdominal pain for any changes. The following actions may help to alleviate any discomfort you are experiencing: °· Only take over-the-counter or prescription medicines as directed by your health care provider. °· Do not take laxatives unless directed to do so by your health care provider. °· Try a clear liquid diet (broth, tea, or water) as directed by your health care provider. Slowly move to a bland diet as tolerated. °SEEK MEDICAL CARE IF: °· You have unexplained abdominal pain. °· You have abdominal pain associated with nausea or diarrhea. °· You have pain when you urinate or have a bowel movement. °· You experience abdominal pain that wakes you in the night. °· You have abdominal pain that is worsened or improved by eating food. °· You have abdominal pain that is worsened with eating fatty foods. °· You have a fever. °SEEK IMMEDIATE MEDICAL CARE IF: °· Your pain does not go away within 2 hours. °· You keep throwing up (vomiting). °· Your pain is felt only in portions of the abdomen, such as the right side or the left lower portion of the abdomen. °· You pass bloody or black tarry stools. °MAKE SURE YOU: °· Understand these instructions. °· Will watch your condition. °· Will get help right away if you are not doing well or get worse. °  °This information is not intended to replace advice given to you by your health care provider. Make sure you discuss  any questions you have with your health care provider. °  °Document Released: 01/21/2005 Document Revised: 01/02/2015 Document Reviewed: 12/21/2012 °Elsevier Interactive Patient Education ©2016 [employer] ° °Pain Without a Known Cause °WHAT IS PAIN WITHOUT A KNOWN CAUSE? °Pain can occur in any part of the body and can range from mild to severe. Sometimes no cause can be found for why you are having pain. Some types of pain that can occur without a known cause include:  °· Headache. °· Back pain. °· Abdominal pain. °· Neck pain. °HOW IS PAIN WITHOUT A KNOWN CAUSE DIAGNOSED?  °Your health care provider will try to find the cause of your pain. This may include: °· Physical exam. °· Medical history. °· Blood tests. °· Urine tests. °· X-rays. °If no cause is found, your health care provider may diagnose you with pain without a known cause.  °IS THERE TREATMENT FOR PAIN WITHOUT A CAUSE?  °Treatment depends on the kind of pain you have. Your health care provider may prescribe medicines to help relieve your pain.  °WHAT CAN I DO AT HOME FOR MY PAIN?  °· Take medicines only as directed by your health care provider. °· Stop any activities that cause pain. During periods of severe pain, bed rest may help. °· Try to reduce your stress with activities such as yoga or meditation. Talk to your health care provider for other stress-reducing activity recommendations. °· Exercise regularly, if approved by your health care provider. °· Eat a healthy   diet that includes fruits and vegetables. This may improve pain. Talk to your health care provider if you have any questions about your diet. °WHAT IF MY PAIN DOES NOT GET BETTER?  °If you have a painful condition and no reason can be found for the pain or the pain gets worse, it is important to follow up with your health care provider. It may be necessary to repeat tests and look further for a possible cause.  °  °This information is not intended to replace advice given to you by your  health care provider. Make sure you discuss any questions you have with your health care provider. °  °Document Released: 01/06/2001 Document Revised: 05/04/2014 Document Reviewed: 08/29/2013 °Elsevier Interactive Patient Education ©2016 [employer] ° °

## 2015-10-25 ENCOUNTER — Encounter: Payer: Self-pay | Admitting: *Deleted

## 2015-10-28 ENCOUNTER — Encounter
Admission: RE | Disposition: A | Discharge status: Home / Self Care | Payer: Self-pay | Source: Ambulatory Visit | Attending: Gastroenterology

## 2015-10-28 ENCOUNTER — Encounter: Payer: Self-pay | Admitting: Anesthesiology

## 2015-10-28 ENCOUNTER — Ambulatory Visit
Admission: RE | Admit: 2015-10-28 | Discharge: 2015-10-28 | Disposition: A | Discharge status: Home / Self Care | Payer: Medicaid Other | Source: Ambulatory Visit | Attending: Gastroenterology | Admitting: Gastroenterology

## 2015-10-28 DIAGNOSIS — R109 Unspecified abdominal pain: Secondary | ICD-10-CM | POA: Insufficient documentation

## 2015-10-28 DIAGNOSIS — Z539 Procedure and treatment not carried out, unspecified reason: Secondary | ICD-10-CM | POA: Insufficient documentation

## 2015-10-28 DIAGNOSIS — R110 Nausea: Secondary | ICD-10-CM | POA: Insufficient documentation

## 2015-10-28 HISTORY — DX: Gastro-esophageal reflux disease without esophagitis: K21.9

## 2015-10-28 SURGERY — ESOPHAGOGASTRODUODENOSCOPY (EGD) WITH PROPOFOL
Anesthesia: General

## 2015-10-28 MED ORDER — SODIUM CHLORIDE 0.9 % IV SOLN: INTRAVENOUS | Status: DC

## 2015-11-23 ENCOUNTER — Emergency Department: Payer: Medicaid Other

## 2015-11-23 ENCOUNTER — Emergency Department
Admission: EM | Admit: 2015-11-23 | Discharge: 2015-11-23 | Disposition: A | Discharge status: Home / Self Care | Payer: Medicaid Other | Attending: Emergency Medicine | Admitting: Emergency Medicine

## 2015-11-23 DIAGNOSIS — R0789 Other chest pain: Secondary | ICD-10-CM | POA: Insufficient documentation

## 2015-11-23 DIAGNOSIS — J45909 Unspecified asthma, uncomplicated: Secondary | ICD-10-CM | POA: Diagnosis not present

## 2015-11-23 DIAGNOSIS — R079 Chest pain, unspecified: Secondary | ICD-10-CM

## 2015-11-23 DIAGNOSIS — F17210 Nicotine dependence, cigarettes, uncomplicated: Secondary | ICD-10-CM | POA: Insufficient documentation

## 2015-11-23 LAB — CBC
HCT: 39.7 % (ref 35.0–47.0)
Hemoglobin: 13.8 g/dL (ref 12.0–16.0)
MCH: 31.5 pg (ref 26.0–34.0)
MCHC: 34.6 g/dL (ref 32.0–36.0)
MCV: 90.8 fL (ref 80.0–100.0)
PLATELETS: 264 10*3/uL (ref 150–440)
RBC: 4.38 MIL/uL (ref 3.80–5.20)
RDW: 14 % (ref 11.5–14.5)
WBC: 6.1 10*3/uL (ref 3.6–11.0)

## 2015-11-23 LAB — BASIC METABOLIC PANEL
Anion gap: 8 (ref 5–15)
BUN: 6 mg/dL (ref 6–20)
CALCIUM: 8.9 mg/dL (ref 8.9–10.3)
CHLORIDE: 110 mmol/L (ref 101–111)
CO2: 22 mmol/L (ref 22–32)
CREATININE: 0.77 mg/dL (ref 0.44–1.00)
GFR calc non Af Amer: >60 mL/min (ref >60)
Glucose, Bld: 123 mg/dL — ABNORMAL HIGH (ref 65–99)
Potassium: 3.7 mmol/L (ref 3.5–5.1)
SODIUM: 140 mmol/L (ref 135–145)

## 2015-11-23 LAB — TROPONIN I

## 2015-11-23 MED ORDER — DOCUSATE SODIUM 100 MG PO CAPS: ORAL_CAPSULE | ORAL | 0 refills | Status: DC

## 2015-11-23 MED ORDER — OXYCODONE-ACETAMINOPHEN 5-325 MG PO TABS
2.0000 | ORAL_TABLET | Freq: Once | ORAL | Status: AC
  Administered 2015-11-23: 2 via ORAL
  Filled 2015-11-23: qty 2

## 2015-11-23 MED ORDER — OXYCODONE-ACETAMINOPHEN 5-325 MG PO TABS: 1.0000 | ORAL_TABLET | ORAL | 0 refills | Status: DC | PRN

## 2015-11-23 NOTE — ED Provider Notes (Signed)
[facility] Emergency Department Provider Note  ____________________________________________   First MD Initiated Contact with Patient 11/23/15 1915     (approximate)  I have reviewed the triage vital signs and the nursing notes.   HISTORY  Chief Complaint Chest Pain    HPI Shawna [name] is a 50 y.o. female with an extensive medical history that includes chronic pain from several sources as well as psychiatric illness and a history of opioid dependence due to chronic orthopedic issues.  She presents for evaluation of chest pain which she states started gradually yesterday and has been constant for more than 24 hours.  She describes it as being below her sternum at the bottom of her sternum and occasionally radiating to her back.  Movement of her torso makes it worse as does deep breaths.  It is also tender to palpation.  Nothing makes it better.  She cannot take NSAIDs at this time because she is scheduled for an outpatient endoscopy (I verified this in her medical record).  She denies fever/chills, shortness of breath, nausea, vomiting, diarrhea, dysuria, abdominal pain.  She states that the pain is severe, sharp, stabbing, and aching.  She uses tobacco.  She does not have hypertension, diabetes, hyperlipidemia, and has no first-degree relatives with a history of heart disease.  She has no history of blood clots in her legs or lungs, she has not had any recent swelling in her legs, and has had no recent immobilizations.  Past Medical History:  Diagnosis Date  . Asthma   . Bulging lumbar disc   . Bulging lumbar disc   . GERD (gastroesophageal reflux disease)   . Insomnia   . Kidney stone   . Pneumonia DEC 2016  . Sciatic leg pain    Right side  . Sciatica of right side   . Weakness    Bil knees, and ankles    Patient Active Problem List   Diagnosis Date Noted  . Bacterial pneumonia 01/23/2015  . Leukocytosis 01/23/2015  . Sepsis (HCC) 01/23/2015  .  Hypoxemia 01/23/2015  . Opioid use disorder, moderate, dependence (HCC) 12/31/2014  . Anxiety disorder, unspecified 12/31/2014    Past Surgical History:  Procedure Laterality Date  . ABDOMINAL HYSTERECTOMY     Partial  . SHOULDER SURGERY Right   . TONSILLECTOMY    . TUBAL LIGATION      Prior to Admission medications   Medication Sig Start Date End Date Taking? Authorizing Provider  albuterol (PROVENTIL HFA;VENTOLIN HFA) 108 (90 BASE) MCG/ACT inhaler Inhale 2 puffs into the lungs every 6 (six) hours as needed for wheezing or shortness of breath.    Historical Provider, MD  ALPRAZolam Prudy Feeler) 1 MG tablet Take 1 mg by mouth at bedtime as needed for anxiety. Reported on 10/28/2015    Historical Provider, MD  clonazePAM (KLONOPIN) 0.5 MG tablet Take 0.5 mg by mouth at bedtime. Reported on 10/28/2015    Historical Provider, MD  clonazePAM (KLONOPIN) 1 MG tablet Take 1 mg by mouth 2 (two) times daily. Reported on 10/28/2015    Historical Provider, MD  diazepam (VALIUM) 5 MG tablet Take 5 mg by mouth every 6 (six) hours as needed for anxiety.    Historical Provider, MD  docusate sodium (COLACE) 100 MG capsule Take 1 tablet once or twice daily as needed for constipation while taking narcotic pain medicine 11/23/15   Loleta Rose, MD  hydrOXYzine (ATARAX/VISTARIL) 50 MG tablet Take 50-100 mg by mouth 5 (five) times daily.  Pt takes one tablet every 6 hours and two tablets at bedtime.    Historical Provider, MD  loratadine (CLARITIN) 10 MG tablet Take 10 mg by mouth daily.     Historical Provider, MD  omeprazole (PRILOSEC) 40 MG capsule Take 40 mg by mouth at bedtime. Reported on 10/28/2015    Historical Provider, MD  ondansetron (ZOFRAN ODT) 4 MG disintegrating tablet Take 1 tablet (4 mg total) by mouth every 8 (eight) hours as needed for nausea or vomiting. 04/09/15   Darci Current, MD  oxyCODONE-acetaminophen (ROXICET) 5-325 MG tablet Take 1-2 tablets by mouth every 4 (four) hours as needed for severe  pain. 11/23/15   Loleta Rose, MD  pantoprazole (PROTONIX) 40 MG tablet Take 40 mg by mouth 2 (two) times daily before a meal. Reported on 10/28/2015    Historical Provider, MD  Probiotic Product (PROBIOTIC DAILY PO) Take 1 tablet by mouth daily. Reported on 10/28/2015    Historical Provider, MD  promethazine (PHENERGAN) 25 MG tablet Take 25 mg by mouth every 6 (six) hours as needed for nausea or vomiting.    Historical Provider, MD  sucralfate (CARAFATE) 1 g tablet Take 1 g by mouth 4 (four) times daily -  with meals and at bedtime.    Historical Provider, MD  traMADol (ULTRAM) 50 MG tablet Take 1 tablet (50 mg total) by mouth every 6 (six) hours as needed for severe pain. 08/21/15 08/20/16  Maurilio Lovely, MD  zolpidem (AMBIEN) 10 MG tablet Take 10 mg by mouth at bedtime.    Historical Provider, MD    Allergies Morphine and related and Vicodin [hydrocodone-acetaminophen]  Family History  Problem Relation Age of Onset  . Fibromyalgia Mother     Social History Social History  Substance Use Topics  . Smoking status: Current Every Day Smoker    Packs/day: 1.00    Years: 40.00    Types: Cigarettes  . Smokeless tobacco: Current User  . Alcohol use No    Review of Systems Constitutional: No fever/chills Eyes: No visual changes. ENT: No sore throat. Cardiovascular: +chest pain. Respiratory: Denies shortness of breath. Gastrointestinal: No abdominal pain.  No nausea, no vomiting.  No diarrhea.  No constipation. Genitourinary: Negative for dysuria. Musculoskeletal: Negative for back pain. Skin: Negative for rash. Neurological: Negative for headaches, focal weakness or numbness.  10-point ROS otherwise negative.  ____________________________________________   PHYSICAL EXAM:  VITAL SIGNS: ED Triage Vitals  Enc Vitals Group     BP 11/23/15 1819 114/87     Pulse Rate 11/23/15 1817 81     Resp 11/23/15 1817 16     Temp 11/23/15 1817 98.5 F (36.9 C)     Temp Source 11/23/15 1817  Oral     SpO2 11/23/15 1817 95 %     Weight 11/23/15 1817 150 lb (68 kg)     Height 11/23/15 1817  (1.676 m)     Head Circumference --      Peak Flow --      Pain Score 11/23/15 1819 10     Pain Loc --      Pain Edu? --      Excl. in GC? --     Constitutional: Alert and oriented. Well appearing and in no acute distress Though she does appear somewhat uncomfortable from time to time. Eyes: Conjunctivae are normal. PERRL. EOMI. Head: Atraumatic. Nose: No congestion/rhinnorhea. Mouth/Throat: Mucous membranes are moist.  Oropharynx non-erythematous. Neck: No stridor.  No meningeal signs.  Cardiovascular: Normal rate, regular rhythm. Good peripheral circulation. Grossly normal heart sounds.   Respiratory: Normal respiratory effort.  No retractions. Lungs CTAB.  Reproducible anterior chest wall tenderness to palpation. Gastrointestinal: Soft and nontender. No distention.  Musculoskeletal: No lower extremity tenderness nor edema. No gross deformities of extremities. Neurologic:  Normal speech and language. No gross focal neurologic deficits are appreciated.  Skin:  Skin is warm, dry and intact. No rash noted. Psychiatric: Mood and affect are normal. Speech and behavior are normal.  ____________________________________________   LABS (all labs ordered are listed, but only abnormal results are displayed)  Labs Reviewed  BASIC METABOLIC PANEL - Abnormal; Notable for the following:       Result Value   Glucose, Bld 123 (*)    All other components within normal limits  CBC  TROPONIN I   ____________________________________________  EKG  ED ECG REPORT I, Taegan Haider, the attending physician, personally viewed and interpreted this ECG.  Date: 11/23/2015 EKG Time: 18:15 Rate: 78 Rhythm: normal sinus rhythm QRS Axis: normal Intervals: normal ST/T Wave abnormalities: normal Conduction Disturbances: none Narrative Interpretation:  unremarkable  ____________________________________________  RADIOLOGY   Dg Chest 2 View  Result Date: 11/23/2015 CLINICAL DATA:  Sharp chest pain beginning at 2 p.m. yesterday. Dizziness and nausea. EXAM: CHEST  2 VIEW COMPARISON:  CTA chest 01/23/2015. FINDINGS: The heart size is normal. Emphysema is again noted. Asymmetric scarring and blebs are again noted at the right apex. There is no focal airspace consolidation. No edema or effusion is present. Right shoulder hemiarthroplasty is noted. IMPRESSION: 1. Moderate emphysema. 2. No acute cardiopulmonary disease. Electronically Signed   By: Marin Roberts M.D.   On: 11/23/2015 19:00   ____________________________________________   PROCEDURES  Procedure(s) performed:   Procedures   ____________________________________________   INITIAL IMPRESSION / ASSESSMENT AND PLAN / ED COURSE  Pertinent labs & imaging results that were available during my care of the patient were reviewed by me and considered in my medical decision making (see chart for details).      Pulmonary Embolism Rule-out Criteria (PERC rule)                        If YES to ANY of the following, the PERC rule is not satisfied and cannot be used to rule out PE in this patient (consider d-dimer or imaging depending on pre-test probability).                      If NO to ALL of the following, AND the clinician's pre-test probability is <15%, the [facility] rule is satisfied and there is no need for further workup (including no need to obtain a d-dimer) as the post-test probability of pulmonary embolism is <2%.                      Mnemonic is HAD CLOTS   H - hormone use (exogenous estrogen)      No. A - age > 50                                                 No. D - DVT/PE history  No.   C - coughing blood (hemoptysis)                 No. L - leg swelling, unilateral                             No. O - O2 Sat on Room Air <  95%                  No. T - tachycardia (HR ? 100)                         No. S - surgery or trauma, recent                      No.   Based on my evaluation of the patient, including application of this decision instrument, further testing to evaluate for pulmonary embolism is not indicated at this time. I have discussed this recommendation with the patient who states understanding and agreement with this plan.  The patient is in general well-appearing with normal vital signs.  Her HEART score is 2 (low risk) and I have low suspicion for ACS.  She cannot take aspirin right now due to her impending outpatient procedure.  I reviewed the patient's prescription history over the last 12 months in the [place] Controlled Substances Database, and she has not had any narcotics filled for 11 months.  I had a talk with her about her prior dependence on opioids and how we would do a very small amount to help her through her acute pain at this time and I encouraged her to follow up with a cardiologist as well as her regular doctor.  I had my usual and customary nonspecific chest pain discussion including return precautions.   Clinical Course    ____________________________________________  FINAL CLINICAL IMPRESSION(S) / ED DIAGNOSES  Final diagnoses:  Nonspecific chest pain     MEDICATIONS GIVEN DURING THIS VISIT:  Medications  oxyCODONE-acetaminophen (PERCOCET/ROXICET) 5-325 MG per tablet 2 tablet (not administered)     NEW OUTPATIENT MEDICATIONS STARTED DURING THIS VISIT:  New Prescriptions   DOCUSATE SODIUM (COLACE) 100 MG CAPSULE    Take 1 tablet once or twice daily as needed for constipation while taking narcotic pain medicine   OXYCODONE-ACETAMINOPHEN (ROXICET) 5-325 MG TABLET    Take 1-2 tablets by mouth every 4 (four) hours as needed for severe pain.      Note:  This document was prepared using Dragon voice recognition software and may include unintentional dictation errors.    Loleta Rose, MD 11/23/15 [phone]

## 2015-11-23 NOTE — ED Triage Notes (Signed)
Pt c/o chest pain starting last PM. Pt reports dull pain to mid chest to the left side. Reports SOB associated with it. Stated pain has remained constant. + Nausea

## 2015-11-23 NOTE — Discharge Instructions (Signed)
You have been seen in the Emergency Department (ED) today for chest pain.  As we have discussed today?s test results are normal, but you may require further testing.  Please follow up with the recommended doctor as instructed above in these documents regarding today?s emergent visit and your recent symptoms to discuss further management.  Continue to take your regular medications.   Take Percocet as prescribed for severe pain. Do not drink alcohol, drive or participate in any other potentially dangerous activities while taking this medication as it may make you sleepy. Do not take this medication with any other sedating medications, either prescription or over-the-counter. If you were prescribed Percocet or Vicodin, do not take these with acetaminophen (Tylenol) as it is already contained within these medications.   This medication is an opiate (or narcotic) pain medication and can be habit forming.  Use it as little as possible to achieve adequate pain control.  Do not use or use it with extreme caution if you have a history of opiate abuse or dependence.  If you are on a pain contract with your primary care doctor or a pain specialist, be sure to let them know you were prescribed this medication today from the [facility] Emergency Department.  This medication is intended for your use only - do not give any to anyone else and keep it in a secure place where nobody else, especially children, have access to it.  It will also cause or worsen constipation, so you may want to consider taking an over-the-counter stool softener while you are taking this medication.  Return to the Emergency Department (ED) if you experience any further chest pain/pressure/tightness, difficulty breathing, or sudden sweating, or other symptoms that concern you.

## 2015-11-26 ENCOUNTER — Encounter: Payer: Self-pay | Admitting: *Deleted

## 2015-11-27 ENCOUNTER — Encounter: Payer: Self-pay | Admitting: *Deleted

## 2015-11-27 ENCOUNTER — Ambulatory Visit: Payer: Medicaid Other | Admitting: Anesthesiology

## 2015-11-27 ENCOUNTER — Encounter
Admission: RE | Disposition: A | Discharge status: Home / Self Care | Payer: Self-pay | Source: Ambulatory Visit | Attending: Gastroenterology

## 2015-11-27 ENCOUNTER — Ambulatory Visit
Admission: RE | Admit: 2015-11-27 | Discharge: 2015-11-27 | Disposition: A | Discharge status: Home / Self Care | Payer: Medicaid Other | Source: Ambulatory Visit | Attending: Gastroenterology | Admitting: Gastroenterology

## 2015-11-27 DIAGNOSIS — M5431 Sciatica, right side: Secondary | ICD-10-CM | POA: Insufficient documentation

## 2015-11-27 DIAGNOSIS — K208 Other esophagitis: Secondary | ICD-10-CM | POA: Insufficient documentation

## 2015-11-27 DIAGNOSIS — K2950 Unspecified chronic gastritis without bleeding: Secondary | ICD-10-CM | POA: Insufficient documentation

## 2015-11-27 DIAGNOSIS — F17200 Nicotine dependence, unspecified, uncomplicated: Secondary | ICD-10-CM | POA: Diagnosis not present

## 2015-11-27 DIAGNOSIS — M5126 Other intervertebral disc displacement, lumbar region: Secondary | ICD-10-CM | POA: Insufficient documentation

## 2015-11-27 DIAGNOSIS — K259 Gastric ulcer, unspecified as acute or chronic, without hemorrhage or perforation: Secondary | ICD-10-CM | POA: Insufficient documentation

## 2015-11-27 DIAGNOSIS — Z87442 Personal history of urinary calculi: Secondary | ICD-10-CM | POA: Diagnosis not present

## 2015-11-27 DIAGNOSIS — Z79899 Other long term (current) drug therapy: Secondary | ICD-10-CM | POA: Diagnosis not present

## 2015-11-27 DIAGNOSIS — J449 Chronic obstructive pulmonary disease, unspecified: Secondary | ICD-10-CM | POA: Insufficient documentation

## 2015-11-27 DIAGNOSIS — G4700 Insomnia, unspecified: Secondary | ICD-10-CM | POA: Insufficient documentation

## 2015-11-27 DIAGNOSIS — F419 Anxiety disorder, unspecified: Secondary | ICD-10-CM | POA: Diagnosis not present

## 2015-11-27 DIAGNOSIS — R1013 Epigastric pain: Secondary | ICD-10-CM | POA: Diagnosis present

## 2015-11-27 DIAGNOSIS — J45909 Unspecified asthma, uncomplicated: Secondary | ICD-10-CM | POA: Insufficient documentation

## 2015-11-27 DIAGNOSIS — K219 Gastro-esophageal reflux disease without esophagitis: Secondary | ICD-10-CM | POA: Insufficient documentation

## 2015-11-27 HISTORY — PX: ESOPHAGOGASTRODUODENOSCOPY (EGD) WITH PROPOFOL: SHX5813

## 2015-11-27 SURGERY — ESOPHAGOGASTRODUODENOSCOPY (EGD) WITH PROPOFOL
Anesthesia: General

## 2015-11-27 MED ORDER — MIDAZOLAM HCL 5 MG/5ML IJ SOLN
INTRAMUSCULAR | Status: DC | PRN
  Administered 2015-11-27: 1 mg via INTRAVENOUS

## 2015-11-27 MED ORDER — PROPOFOL 10 MG/ML IV BOLUS
INTRAVENOUS | Status: DC | PRN
  Administered 2015-11-27: 100 mg via INTRAVENOUS
  Administered 2015-11-27: 50 mg via INTRAVENOUS

## 2015-11-27 MED ORDER — PROPOFOL 500 MG/50ML IV EMUL
INTRAVENOUS | Status: DC | PRN
Start: 2015-11-27 — End: 2015-11-27
  Administered 2015-11-27: 160 ug/kg/min via INTRAVENOUS

## 2015-11-27 MED ORDER — SODIUM CHLORIDE 0.9 % IV SOLN: INTRAVENOUS | Status: DC

## 2015-11-27 MED ORDER — FENTANYL CITRATE (PF) 100 MCG/2ML IJ SOLN
INTRAMUSCULAR | Status: DC | PRN
  Administered 2015-11-27: 50 ug via INTRAVENOUS

## 2015-11-27 MED ORDER — LIDOCAINE 2% (20 MG/ML) 5 ML SYRINGE
INTRAMUSCULAR | Status: DC | PRN
  Administered 2015-11-27: 40 mg via INTRAVENOUS

## 2015-11-27 MED ORDER — PHENYLEPHRINE HCL 10 MG/ML IJ SOLN
INTRAMUSCULAR | Status: DC | PRN
  Administered 2015-11-27: 100 ug via INTRAVENOUS

## 2015-11-27 MED ORDER — SODIUM CHLORIDE 0.9 % IV SOLN
INTRAVENOUS | Status: DC
  Administered 2015-11-27: 1000 mL via INTRAVENOUS
  Administered 2015-11-27: 14:00:00 via INTRAVENOUS

## 2015-11-27 MED ORDER — SODIUM CHLORIDE 0.9 % IV SOLN
2.0000 g | Freq: Once | INTRAVENOUS | Status: AC
  Administered 2015-11-27: 2 g via INTRAVENOUS
  Filled 2015-11-27: qty 2000

## 2015-11-27 NOTE — Anesthesia Preprocedure Evaluation (Signed)
Anesthesia Evaluation  Patient identified by MRN, date of birth, ID band Patient awake    Reviewed: Allergy & Precautions, NPO status , Patient's Chart, lab work & pertinent test results  Airway Mallampati: III       Dental  (+) Partial Upper   Pulmonary COPD,  COPD inhaler, Current Smoker,     + decreased breath sounds      Cardiovascular Exercise Tolerance: Good  Rhythm:Regular     Neuro/Psych Anxiety    GI/Hepatic Neg liver ROS, GERD  Medicated,  Endo/Other  negative endocrine ROS  Renal/GU      Musculoskeletal   Abdominal Normal abdominal exam  (+)   Peds  Hematology negative hematology ROS (+)   Anesthesia Other Findings   Reproductive/Obstetrics                             Anesthesia Physical Anesthesia Plan  ASA: III  Anesthesia Plan: General   Post-op Pain Management:    Induction: Intravenous  Airway Management Planned: Natural Airway and Nasal Cannula  Additional Equipment:   Intra-op Plan:   Post-operative Plan:   Informed Consent: I have reviewed the patients History and Physical, chart, labs and discussed the procedure including the risks, benefits and alternatives for the proposed anesthesia with the patient or authorized representative who has indicated his/her understanding and acceptance.     Plan Discussed with: CRNA  Anesthesia Plan Comments:         Anesthesia Quick Evaluation

## 2015-11-27 NOTE — Transfer of Care (Signed)
Immediate Anesthesia Transfer of Care Note  Patient: Shawna [name]  Procedure(s) Performed: Procedure(s): ESOPHAGOGASTRODUODENOSCOPY (EGD) WITH PROPOFOL (N/A)  Patient Location: PACU and Endoscopy Unit  Anesthesia Type:General  Level of Consciousness: awake, oriented and patient cooperative  Airway & Oxygen Therapy: Patient Spontanous Breathing and Patient connected to nasal cannula oxygen  Post-op Assessment: Report given to RN and Post -op Vital signs reviewed and stable  Post vital signs: Reviewed and stable  Last Vitals:  Vitals:   11/27/15 1250  BP: 136/83  Pulse: (!) 101  Resp: 20  Temp: 37.6 C    Last Pain:  Vitals:   11/27/15 1250  TempSrc: Tympanic  PainSc: 7          Complications: No apparent anesthesia complications

## 2015-11-27 NOTE — Op Note (Signed)
[facility] Gastroenterology Patient Name: Shawna [name] Procedure Date: 11/27/2015 1:51 PM MRN: [id number] Account #: [id number] Date of Birth: 1965/07/26 Admit Type: Outpatient Age: 50 Room: [facility] ENDO ROOM 1 Gender: Female Note Status: Finalized Procedure:            Upper GI endoscopy Indications:          Epigastric abdominal pain, Dyspepsia Providers:            Christena Deem, MD Referring MD:         Silas Flood. Ellsworth Lennox, MD (Referring MD) Medicines:            Monitored Anesthesia Care Complications:        No immediate complications. Procedure:            Pre-Anesthesia Assessment:                       - ASA Grade Assessment: II - A patient with mild                        systemic disease.                       After obtaining informed consent, the endoscope was                        passed under direct vision. Throughout the procedure,                        the patient's blood pressure, pulse, and oxygen                        saturations were monitored continuously. The Endoscope                        was introduced through the mouth, and advanced to the                        duodenal bulb. The upper GI endoscopy was accomplished                        without difficulty. The patient tolerated the procedure                        well. Findings:      LA Grade B (one or more mucosal breaks greater than 5 mm, not extending       between the tops of two mucosal folds) esophagitis with no bleeding was       found. Biopsies were taken with a cold forceps for histology.      The exam of the esophagus was otherwise normal.      One friable cratered gastric ulcer with no visible vessel was found at       the incisura. The lesion was 23 mm in largest dimension.      One non-bleeding linear gastric ulcer with no stigmata of bleeding was       found at the pylorus. The lesion was 4 mm in largest dimension. I did no       t pass the scope into the duodenum  due to the pyloric channel ulcer.       Inspection of the duodenal bulb through  the pylorus opening was grossly       normal.      Biopsies were taken with a cold forceps in the gastric body and on the       greater curvature of the gastric antrum for histology and Helicobacter       pylori testing.      The cardia and gastric fundus were normal on retroflexion. Impression:           - LA Grade B erosive esophagitis. Biopsied.                       - Friable gastric ulcer with no visible vessel.                       - Non-bleeding gastric ulcer with no stigmata of                        bleeding.                       - Biopsies were taken with a cold forceps for histology                        and Helicobacter pylori testing. Recommendation:       - Use Protonix (pantoprazole) 40 mg PO BID for 6 weeks.                       - Use Protonix (pantoprazole) 40 mg PO daily.                       - Repeat upper endoscopy in 7 weeks to check healing.                       - Await pathology results. Procedure Code(s):    --- Professional ---                       [phone], Esophagogastroduodenoscopy, flexible, transoral;                        with biopsy, single or multiple Diagnosis Code(s):    --- Professional ---                       K20.8, Other esophagitis                       K25.9, Gastric ulcer, unspecified as acute or chronic,                        without hemorrhage or perforation                       R10.13, Epigastric pain CPT copyright 2016 [employer]. All rights reserved. The codes documented in this report are preliminary and upon coder review may  be revised to meet current compliance requirements. Christena Deem, MD 11/27/2015 2:22:26 PM This report has been signed electronically. Number of Addenda: 0 Note Initiated On: 11/27/2015 1:51 PM      [facility]

## 2015-11-27 NOTE — H&P (Signed)
Outpatient short stay form Pre-procedure 11/27/2015 1:53 PM Christena Deem MD  Primary Physician: Dr Yves Dill  Reason for visit:  EGD  History of present illness:  Patient is a 50 year old female with a history of epigastric pain. There is also been some amount of atypical chest pain. This was evaluated in the ER several days ago and patient says it was negative. He does have a history of taking BC or Goody powders regularly for hip and back pain. He has been on a proton pump inhibitor in the past however ran out several days ago. She continues to have epigastric to left upper quadrant discomfort. She states she has held any aspirin products since I saw her about 2 weeks ago. At that time she come in for an EGD however were unable to do the procedure because of her recent aspirin intake.    Current Facility-Administered Medications:  .  0.9 %  sodium chloride infusion, , Intravenous, Continuous, Christena Deem, MD, Last Rate: 20 mL/hr at 11/27/15 1323 .  0.9 %  sodium chloride infusion, , Intravenous, Continuous, Christena Deem, MD .  ampicillin (OMNIPEN) 2 g in sodium chloride 0.9 % 50 mL IVPB, 2 g, Intravenous, Once, Christena Deem, MD, 2 g at 11/27/15 1343  Prescriptions Prior to Admission  Medication Sig Dispense Refill Last Dose  . albuterol (PROVENTIL HFA;VENTOLIN HFA) 108 (90 BASE) MCG/ACT inhaler Inhale 2 puffs into the lungs every 6 (six) hours as needed for wheezing or shortness of breath.   11/27/2015 at 0900  . clonazePAM (KLONOPIN) 0.5 MG tablet Take 0.5 mg by mouth at bedtime. Reported on 10/28/2015   11/26/2015 at Unknown time  . clonazePAM (KLONOPIN) 1 MG tablet Take 1 mg by mouth 2 (two) times daily. Reported on 10/28/2015   11/26/2015 at Unknown time  . docusate sodium (COLACE) 100 MG capsule Take 1 tablet once or twice daily as needed for constipation while taking narcotic pain medicine 30 capsule 0 Past Week at Unknown time  . promethazine (PHENERGAN) 25 MG tablet Take  25 mg by mouth every 6 (six) hours as needed for nausea or vomiting.   Past Week at Unknown time  . zolpidem (AMBIEN) 10 MG tablet Take 10 mg by mouth at bedtime.   Past Week at Unknown time  . ALPRAZolam (XANAX) 1 MG tablet Take 1 mg by mouth at bedtime as needed for anxiety. Reported on 10/28/2015   Not Taking at Unknown time  . diazepam (VALIUM) 5 MG tablet Take 5 mg by mouth every 6 (six) hours as needed for anxiety.   Not Taking at Unknown time  . hydrOXYzine (ATARAX/VISTARIL) 50 MG tablet Take 50-100 mg by mouth 5 (five) times daily. Pt takes one tablet every 6 hours and two tablets at bedtime.   Not Taking at Unknown time  . loratadine (CLARITIN) 10 MG tablet Take 10 mg by mouth daily.    Not Taking at Unknown time  . omeprazole (PRILOSEC) 40 MG capsule Take 40 mg by mouth at bedtime. Reported on 10/28/2015   Not Taking at Unknown time  . ondansetron (ZOFRAN ODT) 4 MG disintegrating tablet Take 1 tablet (4 mg total) by mouth every 8 (eight) hours as needed for nausea or vomiting. (Patient not taking: Reported on 11/27/2015) 20 tablet 0 Not Taking  . oxyCODONE-acetaminophen (ROXICET) 5-325 MG tablet Take 1-2 tablets by mouth every 4 (four) hours as needed for severe pain. (Patient not taking: Reported on 11/27/2015) 20 tablet 0 Not  Taking at Unknown time  . pantoprazole (PROTONIX) 40 MG tablet Take 40 mg by mouth 2 (two) times daily before a meal. Reported on 10/28/2015   Not Taking at Unknown time  . Probiotic Product (PROBIOTIC DAILY PO) Take 1 tablet by mouth daily. Reported on 10/28/2015   Not Taking at Unknown time  . sucralfate (CARAFATE) 1 g tablet Take 1 g by mouth 4 (four) times daily -  with meals and at bedtime.   Not Taking at Unknown time  . traMADol (ULTRAM) 50 MG tablet Take 1 tablet (50 mg total) by mouth every 6 (six) hours as needed for severe pain. (Patient not taking: Reported on 11/27/2015) 10 tablet 0 Not Taking at Unknown time     Allergies  Allergen Reactions  . Morphine And Related  Nausea And Vomiting  . Vicodin [Hydrocodone-Acetaminophen] Nausea And Vomiting     Past Medical History:  Diagnosis Date  . Asthma   . Bulging lumbar disc   . Bulging lumbar disc   . GERD (gastroesophageal reflux disease)   . Insomnia   . Kidney stone   . Pneumonia DEC 2016  . Sciatic leg pain    Right side  . Sciatica of right side   . Weakness    Bil knees, and ankles    Review of systems:      Physical Exam    Heart and lungs: Regular rate and rhythm without rub or gallop, lungs are bilaterally    HEENT: Normocephalic atraumatic eyes are anicteric    Other:     Pertinant exam for procedure: Soft nontender nondistended bowel sounds positive normoactive.    Planned proceedures: EGD and indicated procedures.have discussed the risks benefits and complications of procedures to include not limited to bleeding, infection, perforation and the risk of sedation and the patient wishes to proceed.    Christena Deem, MD Gastroenterology 11/27/2015  1:53 PM

## 2015-11-27 NOTE — Anesthesia Postprocedure Evaluation (Signed)
Anesthesia Post Note  Patient: Shawna [name]  Procedure(s) Performed: Procedure(s) (LRB): ESOPHAGOGASTRODUODENOSCOPY (EGD) WITH PROPOFOL (N/A)  Patient location during evaluation: PACU Anesthesia Type: General Level of consciousness: awake Pain management: pain level controlled Vital Signs Assessment: post-procedure vital signs reviewed and stable Respiratory status: spontaneous breathing Cardiovascular status: stable Anesthetic complications: no    Last Vitals:  Vitals:   11/27/15 1420 11/27/15 1429  BP: (!) 81/51 98/68  Pulse: 79 82  Resp: 11 11  Temp:      Last Pain:  Vitals:   11/27/15 1410  TempSrc: (P) Tympanic  PainSc:                  VAN STAVEREN,[name]

## 2015-11-28 ENCOUNTER — Encounter: Payer: Self-pay | Admitting: Gastroenterology

## 2015-11-29 LAB — SURGICAL PATHOLOGY

## 2015-12-14 ENCOUNTER — Encounter: Payer: Self-pay | Admitting: Emergency Medicine

## 2015-12-14 ENCOUNTER — Emergency Department
Admission: EM | Admit: 2015-12-14 | Discharge: 2015-12-14 | Disposition: A | Discharge status: Home / Self Care | Payer: Medicaid Other | Attending: Emergency Medicine | Admitting: Emergency Medicine

## 2015-12-14 DIAGNOSIS — Z7951 Long term (current) use of inhaled steroids: Secondary | ICD-10-CM | POA: Diagnosis not present

## 2015-12-14 DIAGNOSIS — K2970 Gastritis, unspecified, without bleeding: Secondary | ICD-10-CM | POA: Diagnosis not present

## 2015-12-14 DIAGNOSIS — J45909 Unspecified asthma, uncomplicated: Secondary | ICD-10-CM | POA: Diagnosis not present

## 2015-12-14 DIAGNOSIS — R1012 Left upper quadrant pain: Secondary | ICD-10-CM | POA: Diagnosis present

## 2015-12-14 DIAGNOSIS — F17210 Nicotine dependence, cigarettes, uncomplicated: Secondary | ICD-10-CM | POA: Diagnosis not present

## 2015-12-14 LAB — CBC WITH DIFFERENTIAL/PLATELET
Basophils Absolute: 0 10*3/uL (ref 0–0.1)
Basophils Relative: 1 %
Eosinophils Absolute: 0.1 10*3/uL (ref 0–0.7)
Eosinophils Relative: 2 %
HEMATOCRIT: 37.8 % (ref 35.0–47.0)
Hemoglobin: 13.3 g/dL (ref 12.0–16.0)
LYMPHS PCT: 37 %
Lymphs Abs: 2.3 10*3/uL (ref 1.0–3.6)
MCH: 31.8 pg (ref 26.0–34.0)
MCHC: 35.1 g/dL (ref 32.0–36.0)
MCV: 90.5 fL (ref 80.0–100.0)
MONO ABS: 0.3 10*3/uL (ref 0.2–0.9)
MONOS PCT: 5 %
NEUTROS ABS: 3.4 10*3/uL (ref 1.4–6.5)
Neutrophils Relative %: 55 %
Platelets: 271 10*3/uL (ref 150–440)
RBC: 4.18 MIL/uL (ref 3.80–5.20)
RDW: 13.6 % (ref 11.5–14.5)
WBC: 6.1 10*3/uL (ref 3.6–11.0)

## 2015-12-14 LAB — URINALYSIS COMPLETE WITH MICROSCOPIC (ARMC ONLY)
BILIRUBIN URINE: NEGATIVE
Bacteria, UA: NONE SEEN
Glucose, UA: NEGATIVE mg/dL
Hgb urine dipstick: NEGATIVE
KETONES UR: NEGATIVE mg/dL
LEUKOCYTES UA: NEGATIVE
NITRITE: NEGATIVE
PH: 5 (ref 5.0–8.0)
Protein, ur: NEGATIVE mg/dL
Specific Gravity, Urine: 1.019 (ref 1.005–1.030)

## 2015-12-14 LAB — LIPASE, BLOOD: LIPASE: 20 U/L (ref 11–51)

## 2015-12-14 LAB — COMPREHENSIVE METABOLIC PANEL
ALT: 11 U/L — ABNORMAL LOW (ref 14–54)
ANION GAP: 6 (ref 5–15)
AST: 18 U/L (ref 15–41)
Albumin: 3.8 g/dL (ref 3.5–5.0)
Alkaline Phosphatase: 95 U/L (ref 38–126)
BILIRUBIN TOTAL: 0.4 mg/dL (ref 0.3–1.2)
BUN: 12 mg/dL (ref 6–20)
CO2: 23 mmol/L (ref 22–32)
Calcium: 8.4 mg/dL — ABNORMAL LOW (ref 8.9–10.3)
Chloride: 111 mmol/L (ref 101–111)
Creatinine, Ser: 0.76 mg/dL (ref 0.44–1.00)
Glucose, Bld: 106 mg/dL — ABNORMAL HIGH (ref 65–99)
POTASSIUM: 3.6 mmol/L (ref 3.5–5.1)
Sodium: 140 mmol/L (ref 135–145)
TOTAL PROTEIN: 6.6 g/dL (ref 6.5–8.1)

## 2015-12-14 MED ORDER — SODIUM CHLORIDE 0.9 % IV BOLUS (SEPSIS)
1000.0000 mL | Freq: Once | INTRAVENOUS | Status: AC
  Administered 2015-12-14: 1000 mL via INTRAVENOUS

## 2015-12-14 MED ORDER — HYDROMORPHONE HCL 1 MG/ML IJ SOLN
0.5000 mg | Freq: Once | INTRAMUSCULAR | Status: AC
  Administered 2015-12-14: 0.5 mg via INTRAVENOUS
  Filled 2015-12-14: qty 1

## 2015-12-14 MED ORDER — METOCLOPRAMIDE HCL 5 MG/ML IJ SOLN
10.0000 mg | Freq: Once | INTRAMUSCULAR | Status: AC
  Administered 2015-12-14: 10 mg via INTRAVENOUS
  Filled 2015-12-14: qty 2

## 2015-12-14 MED ORDER — FAMOTIDINE IN NACL 20-0.9 MG/50ML-% IV SOLN
20.0000 mg | Freq: Once | INTRAVENOUS | Status: AC
  Administered 2015-12-14: 20 mg via INTRAVENOUS
  Filled 2015-12-14: qty 50

## 2015-12-14 MED ORDER — GI COCKTAIL ~~LOC~~
30.0000 mL | Freq: Once | ORAL | Status: AC
  Administered 2015-12-14: 30 mL via ORAL
  Filled 2015-12-14: qty 30

## 2015-12-14 MED ORDER — ALUMINUM & MAGNESIUM HYDROXIDE 200-200 MG/5ML PO SUSP: 10.0000 mL | Freq: Four times a day (QID) | ORAL | 0 refills | Status: DC | PRN

## 2015-12-14 MED ORDER — DICYCLOMINE HCL 10 MG/ML IM SOLN
20.0000 mg | Freq: Once | INTRAMUSCULAR | Status: AC
  Administered 2015-12-14: 20 mg via INTRAMUSCULAR
  Filled 2015-12-14: qty 2

## 2015-12-14 NOTE — ED Notes (Signed)
20G IV removed from left forearm.  Area is clean, dry and intact and the IV cath was intact upon removal.

## 2015-12-14 NOTE — ED Triage Notes (Signed)
Pt arrived to ED by EMS with c/o abdominal pain. Pt recently diagnosed with  2 ulcers with associated  R and L upper quadrant pain. Pt had endoscopy performed on 8/2 and today received notification that she is negative for h-pylori. Pt advised to seek medical attention if pain increased.

## 2015-12-14 NOTE — Discharge Instructions (Signed)
Continue her home meds. Follow-up with her primary care doctor 2 days. Return to the emergency department for new or worsening abdominal pain, fever, chest pain, shortness of breath, urinary symptoms, or any other symptoms concerning to you.

## 2015-12-14 NOTE — ED Notes (Signed)
MD Veronese at bedside  

## 2015-12-14 NOTE — ED Provider Notes (Signed)
[facility] Emergency Department Provider Note  ____________________________________________  Time seen: Approximately 4:42 PM  I have reviewed the triage vital signs and the nursing notes.   HISTORY  Chief Complaint Abdominal Pain   HPI Shawna [name] is a 50 y.o. female with a history of peptic ulcer disease and gastritis who presents for evaluation of left upper quadrant abdominal pain. Patient reports that she has had this pain for 9 months. She underwent EGD on 11/28/15 that showed peptic ulcer disease and gastritis. Patient reports that the pain today was worse. She currently endorses 8 out of 10 sharp pain located in the left upper quadrant nonradiating and associated with nausea. She denies melena, vomiting, hematemesis, hematochezia, chest pain, shortness of breath, fever, urinary symptoms, diarrhea, constipation. She reports she's been taking Tylenol at home with no relief of her pain. She endorses compliance with her protonix. She also reports chills. She reports her symptoms are the same she has had for the last 9 months.  Past Medical History:  Diagnosis Date  . Asthma   . Bulging lumbar disc   . Bulging lumbar disc   . GERD (gastroesophageal reflux disease)   . Insomnia   . Kidney stone   . Pneumonia DEC 2016  . Sciatic leg pain    Right side  . Sciatica of right side   . Weakness    Bil knees, and ankles    Patient Active Problem List   Diagnosis Date Noted  . Bacterial pneumonia 01/23/2015  . Leukocytosis 01/23/2015  . Sepsis (HCC) 01/23/2015  . Hypoxemia 01/23/2015  . Opioid use disorder, moderate, dependence (HCC) 12/31/2014  . Anxiety disorder, unspecified 12/31/2014    Past Surgical History:  Procedure Laterality Date  . ABDOMINAL HYSTERECTOMY     Partial  . ESOPHAGOGASTRODUODENOSCOPY (EGD) WITH PROPOFOL N/A 11/27/2015   Procedure: ESOPHAGOGASTRODUODENOSCOPY (EGD) WITH PROPOFOL;  Surgeon: Christena DeemMartin U Skulskie, MD;  [facility];  Service: Endoscopy;  Laterality: N/A;  . SHOULDER SURGERY Right   . TONSILLECTOMY    . TUBAL LIGATION      Prior to Admission medications   Medication Sig Start Date End Date Taking? Authorizing Provider  albuterol (PROVENTIL HFA;VENTOLIN HFA) 108 (90 BASE) MCG/ACT inhaler Inhale 2 puffs into the lungs every 6 (six) hours as needed for wheezing or shortness of breath.   Yes Historical Provider, MD  clonazePAM (KLONOPIN) 1 MG tablet Take 1 mg by mouth 2 (two) times daily. Reported on 10/28/2015   Yes Historical Provider, MD  pantoprazole (PROTONIX) 40 MG tablet Take 40 mg by mouth 2 (two) times daily before a meal. Reported on 10/28/2015   Yes Historical Provider, MD  Probiotic Product (PROBIOTIC DAILY PO) Take 1 tablet by mouth daily. Reported on 10/28/2015   Yes Historical Provider, MD  promethazine (PHENERGAN) 25 MG tablet Take 25 mg by mouth every 6 (six) hours as needed for nausea or vomiting.   Yes Historical Provider, MD  zolpidem (AMBIEN) 10 MG tablet Take 10 mg by mouth at bedtime.   Yes Historical Provider, MD  aluminum-magnesium hydroxide 200-200 MG/5ML suspension Take 10 mLs by mouth every 6 (six) hours as needed for indigestion. 12/14/15   Nita Sicklearolina Shareena Nusz, MD  docusate sodium (COLACE) 100 MG capsule Take 1 tablet once or twice daily as needed for constipation while taking narcotic pain medicine 11/23/15   Loleta Roseory Forbach, MD  ondansetron (ZOFRAN ODT) 4 MG disintegrating tablet Take 1 tablet (4 mg total) by mouth every 8 (eight)  hours as needed for nausea or vomiting. Patient not taking: Reported on 11/27/2015 04/09/15   Darci Currentandolph N Brown, MD  oxyCODONE-acetaminophen (ROXICET) 5-325 MG tablet Take 1-2 tablets by mouth every 4 (four) hours as needed for severe pain. Patient not taking: Reported on 11/27/2015 11/23/15   Loleta Roseory Forbach, MD  traMADol (ULTRAM) 50 MG tablet Take 1 tablet (50 mg total) by mouth every 6 (six) hours as needed for severe pain. Patient not taking: Reported on  11/27/2015 08/21/15 08/20/16  Maurilio LovelyNoelle McLaurin, MD    Allergies Fentanyl; Morphine and related; and Vicodin [hydrocodone-acetaminophen]  Family History  Problem Relation Age of Onset  . Fibromyalgia Mother     Social History Social History  Substance Use Topics  . Smoking status: Current Every Day Smoker    Packs/day: 1.00    Years: 40.00    Types: Cigarettes  . Smokeless tobacco: Current User  . Alcohol use No    Review of Systems  Constitutional: Negative for fever. + chills Eyes: Negative for visual changes. ENT: Negative for sore throat. Cardiovascular: Negative for chest pain. Respiratory: Negative for shortness of breath. Gastrointestinal: + LUQ abdominal pain and nausea. No vomiting or diarrhea. Genitourinary: Negative for dysuria. Musculoskeletal: Negative for back pain. Skin: Negative for rash. Neurological: Negative for headaches, weakness or numbness.  ____________________________________________   PHYSICAL EXAM:  VITAL SIGNS: Vitals:   12/14/15 1641  BP: 112/90  Pulse: 71  Resp: 20  Temp: 98.1 F (36.7 C)   Constitutional: Alert and oriented. Well appearing and in no apparent distress. HEENT:      Head: Normocephalic and atraumatic.         Eyes: Conjunctivae are normal. Sclera is non-icteric. EOMI. PERRL      Mouth/Throat: Mucous membranes are moist.       Neck: Supple with no signs of meningismus. Cardiovascular: Regular rate and rhythm. No murmurs, gallops, or rubs. 2+ symmetrical distal pulses are present in all extremities. No JVD. Respiratory: Normal respiratory effort. Lungs are clear to auscultation bilaterally. No wheezes, crackles, or rhonchi.  Gastrointestinal: Soft, ttp over the LUQ, non distended with positive bowel sounds. No rebound or guarding. Rectal exam showing brown stool hemoccult negative. Genitourinary: No CVA tenderness. Musculoskeletal: Nontender with normal range of motion in all extremities. No edema, cyanosis, or erythema  of extremities. Neurologic: Normal speech and language. Face is symmetric. Moving all extremities. No gross focal neurologic deficits are appreciated. Skin: Skin is warm, dry and intact. No rash noted. Psychiatric: Mood and affect are normal. Speech and behavior are normal.  ____________________________________________   LABS (all labs ordered are listed, but only abnormal results are displayed)  Labs Reviewed  COMPREHENSIVE METABOLIC PANEL - Abnormal; Notable for the following:       Result Value   Glucose, Bld 106 (*)    Calcium 8.4 (*)    ALT 11 (*)    All other components within normal limits  URINALYSIS COMPLETEWITH MICROSCOPIC ([facility] ONLY) - Abnormal; Notable for the following:    Color, Urine YELLOW (*)    APPearance CLEAR (*)    Squamous Epithelial / LPF 0-5 (*)    All other components within normal limits  CBC WITH DIFFERENTIAL/PLATELET  LIPASE, BLOOD   ____________________________________________  EKG  ED ECG REPORT I, Nita Sicklearolina Summers Buendia, the attending physician, personally viewed and interpreted this ECG.  Normal sinus rhythm, rate of 67, normal intervals, normal axis, no ST elevations or depressions. T-wave inversions in V2 and V3. Unchanged from prior ____________________________________________  RADIOLOGY  none ____________________________________________   PROCEDURES  Procedure(s) performed: None Procedures Critical Care performed:  None ____________________________________________   INITIAL IMPRESSION / ASSESSMENT AND PLAN / ED COURSE  50 y.o. female with a history of peptic ulcer disease and gastritis who presents for evaluation of left upper quadrant abdominal pain x 9 months but worse today. Associated with nausea and chills. Patient is well-appearing, in no distress, her vital signs are within normal limits, she has tenderness to palpation on the left upper quadrant with no rebound or guarding, remainder of her abdomen is soft and nontender.  Rectal exam showing brown stool Hemoccult negative. Presentation concerning for flare of her ulcer disease. We'll treat symptoms with IV Pepcid, GI cocktail, IV morphine, IV Reglan, IV fluids. We'll check labs, UA.  Clinical Course  Comment By Time  Patient feels markedly improved and is requesting discharge. Her labs and urine are within normal limits. will discharge patent home at this time with close follow-up with her primary care doctor and continuing her home meds. Nita Sickle, MD 08/19 1932    Pertinent labs & imaging results that were available during my care of the patient were reviewed by me and considered in my medical decision making (see chart for details).    ____________________________________________   FINAL CLINICAL IMPRESSION(S) / ED DIAGNOSES  Final diagnoses:  Left upper quadrant pain  Gastritis      NEW MEDICATIONS STARTED DURING THIS VISIT:  New Prescriptions   ALUMINUM-MAGNESIUM HYDROXIDE 200-200 MG/5ML SUSPENSION    Take 10 mLs by mouth every 6 (six) hours as needed for indigestion.     Note:  This document was prepared using Dragon voice recognition software and may include unintentional dictation errors.    Nita Sickle, MD 12/14/15 [phone]

## 2016-01-13 ENCOUNTER — Other Ambulatory Visit: Payer: Self-pay | Admitting: Internal Medicine

## 2016-01-13 DIAGNOSIS — Z1231 Encounter for screening mammogram for malignant neoplasm of breast: Secondary | ICD-10-CM

## 2016-01-16 ENCOUNTER — Encounter: Payer: Self-pay | Admitting: Emergency Medicine

## 2016-01-16 ENCOUNTER — Emergency Department: Payer: Medicaid Other

## 2016-01-16 ENCOUNTER — Emergency Department
Admission: EM | Admit: 2016-01-16 | Discharge: 2016-01-16 | Disposition: A | Discharge status: Home / Self Care | Payer: Medicaid Other | Attending: Emergency Medicine | Admitting: Emergency Medicine

## 2016-01-16 DIAGNOSIS — J40 Bronchitis, not specified as acute or chronic: Secondary | ICD-10-CM | POA: Diagnosis not present

## 2016-01-16 DIAGNOSIS — Z791 Long term (current) use of non-steroidal anti-inflammatories (NSAID): Secondary | ICD-10-CM | POA: Diagnosis not present

## 2016-01-16 DIAGNOSIS — R05 Cough: Secondary | ICD-10-CM | POA: Diagnosis present

## 2016-01-16 DIAGNOSIS — F17210 Nicotine dependence, cigarettes, uncomplicated: Secondary | ICD-10-CM | POA: Insufficient documentation

## 2016-01-16 DIAGNOSIS — Z79899 Other long term (current) drug therapy: Secondary | ICD-10-CM | POA: Diagnosis not present

## 2016-01-16 LAB — CBC
HEMATOCRIT: 33.4 % — AB (ref 35.0–47.0)
Hemoglobin: 12 g/dL (ref 12.0–16.0)
MCH: 32.2 pg (ref 26.0–34.0)
MCHC: 35.8 g/dL (ref 32.0–36.0)
MCV: 90 fL (ref 80.0–100.0)
Platelets: 333 10*3/uL (ref 150–440)
RBC: 3.72 MIL/uL — AB (ref 3.80–5.20)
RDW: 14.2 % (ref 11.5–14.5)
WBC: 8.9 10*3/uL (ref 3.6–11.0)

## 2016-01-16 LAB — BASIC METABOLIC PANEL
Anion gap: 5 (ref 5–15)
BUN: 7 mg/dL (ref 6–20)
CHLORIDE: 110 mmol/L (ref 101–111)
CO2: 24 mmol/L (ref 22–32)
Calcium: 8.3 mg/dL — ABNORMAL LOW (ref 8.9–10.3)
Creatinine, Ser: 0.65 mg/dL (ref 0.44–1.00)
GFR calc non Af Amer: >60 mL/min (ref >60)
Glucose, Bld: 101 mg/dL — ABNORMAL HIGH (ref 65–99)
POTASSIUM: 3.1 mmol/L — AB (ref 3.5–5.1)
SODIUM: 139 mmol/L (ref 135–145)

## 2016-01-16 MED ORDER — ALBUTEROL SULFATE HFA 108 (90 BASE) MCG/ACT IN AERS: 2.0000 | INHALATION_SPRAY | Freq: Four times a day (QID) | RESPIRATORY_TRACT | 2 refills | Status: DC | PRN

## 2016-01-16 MED ORDER — IPRATROPIUM-ALBUTEROL 0.5-2.5 (3) MG/3ML IN SOLN
3.0000 mL | Freq: Once | RESPIRATORY_TRACT | Status: AC
  Administered 2016-01-16: 3 mL via RESPIRATORY_TRACT
  Filled 2016-01-16: qty 3

## 2016-01-16 MED ORDER — PREDNISONE 20 MG PO TABS
60.0000 mg | ORAL_TABLET | Freq: Once | ORAL | Status: AC
  Administered 2016-01-16: 60 mg via ORAL
  Filled 2016-01-16: qty 3

## 2016-01-16 MED ORDER — CEPHALEXIN 500 MG PO CAPS: 500.0000 mg | ORAL_CAPSULE | Freq: Four times a day (QID) | ORAL | 0 refills | Status: AC

## 2016-01-16 MED ORDER — PREDNISONE 20 MG PO TABS: 40.0000 mg | ORAL_TABLET | Freq: Every day | ORAL | 0 refills | Status: DC

## 2016-01-16 NOTE — Discharge Instructions (Signed)
Please start the antibiotic if you develop a fever or an increasing productive cough. Please contact her primary physician and continue with further outpatient follow-up for appears to be acute bronchitis. Your x-rays are showing chronic affects of cigarette smoking with emphysema and COPD noted on both of your chest x-ray was performed last month and also this month. Please continue to try to make efforts to decrease her smoking.  Please return immediately if condition worsens. Please contact her primary physician or the physician you were given for referral. If you have any specialist physicians involved in her treatment and plan please also contact them. Thank you for using [facility].

## 2016-01-16 NOTE — ED Provider Notes (Signed)
Time Seen: Approximately 1638  I have reviewed the triage notes  Chief Complaint: Shortness of Breath   History of Present Illness: Shawna [name] is a 50 y.o. female who presents with a persistent cough that she's had now for the last several weeks. Patient was not aware that she had a history of copd and emphysema with her chronic cigarette abuse. The patient states that she has run out of her inhaler. She denies any objective fever but states that she's had some chills and "" night sweats "". She states her cough is mostly dry but occasionally productive with some green tinged sputum. Patient has been diagnosed with bronchitis in the past. She denies any current chest pain, nausea, vomiting.  Past Medical History:  Diagnosis Date  . Asthma   . Bulging lumbar disc   . Bulging lumbar disc   . GERD (gastroesophageal reflux disease)   . Insomnia   . Kidney stone   . Pneumonia DEC 2016  . Sciatic leg pain    Right side  . Sciatica of right side   . Weakness    Bil knees, and ankles    Patient Active Problem List   Diagnosis Date Noted  . Bacterial pneumonia 01/23/2015  . Leukocytosis 01/23/2015  . Sepsis (HCC) 01/23/2015  . Hypoxemia 01/23/2015  . Opioid use disorder, moderate, dependence (HCC) 12/31/2014  . Anxiety disorder, unspecified 12/31/2014    Past Surgical History:  Procedure Laterality Date  . ABDOMINAL HYSTERECTOMY     Partial  . ESOPHAGOGASTRODUODENOSCOPY (EGD) WITH PROPOFOL N/A 11/27/2015   Procedure: ESOPHAGOGASTRODUODENOSCOPY (EGD) WITH PROPOFOL;  Surgeon: Christena Deem, MD;  Location: [facility] ENDOSCOPY;  Service: Endoscopy;  Laterality: N/A;  . SHOULDER SURGERY Right   . TONSILLECTOMY    . TUBAL LIGATION      Past Surgical History:  Procedure Laterality Date  . ABDOMINAL HYSTERECTOMY     Partial  . ESOPHAGOGASTRODUODENOSCOPY (EGD) WITH PROPOFOL N/A 11/27/2015   Procedure: ESOPHAGOGASTRODUODENOSCOPY (EGD) WITH PROPOFOL;  Surgeon: Christena Deem, MD;   Location: [facility] ENDOSCOPY;  Service: Endoscopy;  Laterality: N/A;  . SHOULDER SURGERY Right   . TONSILLECTOMY    . TUBAL LIGATION      Current Outpatient Rx  . Order #: [id number] Class: Print  . Order #: [id number] Class: Print  . Order #: [id number] Class: Print  . Order #: [id number] Class: Historical Med  . Order #: [id number] Class: Print  . Order #: [id number] Class: Print  . Order #: [id number] Class: Print  . Order #: [id number] Class: Historical Med  . Order #: [id number] Class: Print  . Order #: [id number] Class: Historical Med  . Order #: [id number] Class: Historical Med  . Order #: [id number] Class: Print  . Order #: [id number] Class: Historical Med    Allergies:  Fentanyl; Morphine and related; Propofol; and Vicodin [hydrocodone-acetaminophen]  Family History: Family History  Problem Relation Age of Onset  . Fibromyalgia Mother     Social History: Social History  Substance Use Topics  . Smoking status: Current Every Day Smoker    Packs/day: 1.00    Years: 40.00    Types: Cigarettes  . Smokeless tobacco: Current User  . Alcohol use No     Review of Systems:   10 point review of systems was performed and was otherwise negative:  Constitutional: No fever Eyes: No visual disturbances ENT: No sore throat, ear pain Cardiac: No chest pain Respiratory: Shortness of breath with no audible wheezing at home or stridor Abdomen: No abdominal pain, no vomiting, No diarrhea Endocrine: No weight  loss, No night sweats Extremities: No peripheral edema, cyanosis Skin: No rashes, easy bruising Neurologic: No focal weakness, trouble with speech or swollowing Urologic: No dysuria, Hematuria, or urinary frequency   Physical Exam:  ED Triage Vitals  Enc Vitals Group     BP 01/16/16 1527 113/75     Pulse Rate 01/16/16 1527 84     Resp 01/16/16 1527 17     Temp 01/16/16 1527 98.3 F (36.8 C)     Temp Source 01/16/16 1527 Oral     SpO2 01/16/16 1527 96 %     Weight 01/16/16 1527 140 lb  (63.5 kg)     Height 01/16/16 1527 5\' 6"  (1.676 m)     Head Circumference --      Peak Flow --      Pain Score 01/16/16 1528 8     Pain Loc --      Pain Edu? --      Excl. in GC? --     General: Awake , Alert , and Oriented times 3; GCS 15 Head: Normal cephalic , atraumatic Eyes: Pupils equal , round, reactive to light Nose/Throat: No nasal drainage, patent upper airway without erythema or exudate.  Neck: Supple, Full range of motion, No anterior adenopathy or palpable thyroid masses Lungs: Rhonchi heard mainly at the left base without and expiratory wheezing or rales.  Heart: Regular rate, regular rhythm without murmurs , gallops , or rubs Abdomen: Soft, non tender without rebound, guarding , or rigidity; bowel sounds positive and symmetric in all 4 quadrants. No organomegaly .        Extremities: 2 plus symmetric pulses. No edema, clubbing or cyanosis Neurologic: normal ambulation, Motor symmetric without deficits, sensory intact Skin: warm, dry, no rashes   Labs:   All laboratory work was reviewed including any pertinent negatives or positives listed below:  Labs Reviewed  BASIC METABOLIC PANEL - Abnormal; Notable for the following:       Result Value   Potassium 3.1 (*)    Glucose, Bld 101 (*)    Calcium 8.3 (*)    All other components within normal limits  CBC - Abnormal; Notable for the following:    RBC 3.72 (*)    HCT 33.4 (*)    All other components within normal limits    EKG: * ED ECG REPORT I, Jennye MoccasinBrian S Bridie Colquhoun, the attending physician, personally viewed and interpreted this ECG.  Date: 01/16/2016 EKG Time: 1538 Rate: *82 Rhythm: normal sinus rhythm QRS Axis: normal Intervals: normal ST/T Wave abnormalities: Nonspecific T wave abnormality Conduction Disturbances: none Narrative Interpretation: unremarkable No acute ischemic changes   Radiology:  "Dg Chest 2 View  Result Date: 01/16/2016 CLINICAL DATA:  Shortness of breath and productive cough EXAM:  CHEST  2 VIEW COMPARISON:  Chest radiograph 11/23/2015 FINDINGS: Lungs are mildly hyperexpanded. No focal consolidation or pulmonary edema. Normal cardiomediastinal contours. No pleural effusion or pneumothorax. IMPRESSION: Hyperexpansion of the lungs suggests COPD. No focal airspace disease. Electronically Signed   By: Deatra RobinsonKevin  Herman M.D.   On: 01/16/2016 15:58  "  I personally reviewed the radiologic studies    ED Course:  Patient's stay here was uneventful. She was given a DuoNeb and prednisone. She'll be given prescriptions for both on an outpatient basis. I prescribed her also Keflex for the productive nature of her cough. I felt this was unlikely to be a pulmonary embolism or current reason to hospitalize the patient. Patient was referred back to her primary  physician and advised continue all of her other current medications. Clinical Course     Assessment: Acute bronchitis COPD Tobacco abuse  Final Clinical Impression:  Final diagnoses:  Bronchitis     Plan: Patient was advised to decrease her smoking which is currently at half a pack of cigarettes per day. She's advised return here if she develops an objective fever, increasing productive cough, increasing shortness of breath or any new concerns " New Prescriptions   ALBUTEROL (PROVENTIL HFA;VENTOLIN HFA) 108 (90 BASE) MCG/ACT INHALER    Inhale 2 puffs into the lungs every 6 (six) hours as needed for wheezing or shortness of breath.   CEPHALEXIN (KEFLEX) 500 MG CAPSULE    Take 1 capsule (500 mg total) by mouth 4 (four) times daily.   PREDNISONE (DELTASONE) 20 MG TABLET    Take 2 tablets (40 mg total) by mouth daily.  " Patient was advised to return immediately if condition worsens. Patient was advised to follow up with their primary care physician or other specialized physicians involved in their outpatient care. The patient and/or family member/power of attorney had laboratory results reviewed at the bedside. All questions  and concerns were addressed and appropriate discharge instructions were distributed by the nursing staff.             Jennye Moccasin, MD 01/16/16 [phone]

## 2016-01-16 NOTE — ED Triage Notes (Addendum)
Pt reports start of cough 31st last month. C/o chills and SHOB. Yellow productive cough. Reports feels like when she has had PNA in past.

## 2016-02-04 ENCOUNTER — Ambulatory Visit: Admit: 2016-02-04 | Payer: Medicaid Other | Admitting: Gastroenterology

## 2016-02-04 SURGERY — ESOPHAGOGASTRODUODENOSCOPY (EGD) WITH PROPOFOL
Anesthesia: General

## 2016-02-06 ENCOUNTER — Ambulatory Visit: Payer: Medicaid Other

## 2016-05-18 ENCOUNTER — Emergency Department: Payer: Medicaid Other

## 2016-05-18 ENCOUNTER — Encounter: Payer: Self-pay | Admitting: Emergency Medicine

## 2016-05-18 ENCOUNTER — Emergency Department
Admission: EM | Admit: 2016-05-18 | Discharge: 2016-05-19 | Disposition: A | Discharge status: Home / Self Care | Payer: Medicaid Other | Attending: Emergency Medicine | Admitting: Emergency Medicine

## 2016-05-18 DIAGNOSIS — F17210 Nicotine dependence, cigarettes, uncomplicated: Secondary | ICD-10-CM | POA: Insufficient documentation

## 2016-05-18 DIAGNOSIS — R079 Chest pain, unspecified: Secondary | ICD-10-CM | POA: Diagnosis not present

## 2016-05-18 DIAGNOSIS — Z5321 Procedure and treatment not carried out due to patient leaving prior to being seen by health care provider: Secondary | ICD-10-CM | POA: Diagnosis not present

## 2016-05-18 DIAGNOSIS — F17290 Nicotine dependence, other tobacco product, uncomplicated: Secondary | ICD-10-CM | POA: Insufficient documentation

## 2016-05-18 DIAGNOSIS — J45909 Unspecified asthma, uncomplicated: Secondary | ICD-10-CM | POA: Insufficient documentation

## 2016-05-18 DIAGNOSIS — Z79899 Other long term (current) drug therapy: Secondary | ICD-10-CM | POA: Diagnosis not present

## 2016-05-18 LAB — CBC
HCT: 39.2 % (ref 35.0–47.0)
HEMOGLOBIN: 13.6 g/dL (ref 12.0–16.0)
MCH: 31.6 pg (ref 26.0–34.0)
MCHC: 34.6 g/dL (ref 32.0–36.0)
MCV: 91.3 fL (ref 80.0–100.0)
PLATELETS: 341 10*3/uL (ref 150–440)
RBC: 4.3 MIL/uL (ref 3.80–5.20)
RDW: 14 % (ref 11.5–14.5)
WBC: 9.8 10*3/uL (ref 3.6–11.0)

## 2016-05-18 LAB — BASIC METABOLIC PANEL
ANION GAP: 7 (ref 5–15)
BUN: 9 mg/dL (ref 6–20)
CALCIUM: 8.8 mg/dL — AB (ref 8.9–10.3)
CO2: 24 mmol/L (ref 22–32)
CREATININE: 0.65 mg/dL (ref 0.44–1.00)
Chloride: 107 mmol/L (ref 101–111)
Glucose, Bld: 100 mg/dL — ABNORMAL HIGH (ref 65–99)
Potassium: 3.8 mmol/L (ref 3.5–5.1)
SODIUM: 138 mmol/L (ref 135–145)

## 2016-05-18 LAB — TROPONIN I

## 2016-05-18 NOTE — ED Triage Notes (Signed)
Pt presents to ED via [facility] with c/o LEFT sided chest pain radiating to LEFT arm, rib, and back since yesterday. Pt reports was seen by EMS yesterday but refused to come to ED. Pt states pain has increased today. Pt denies hx of the same. Pt alert and oriented x 4, respirations even and unlabored.

## 2016-05-19 ENCOUNTER — Ambulatory Visit: Payer: Medicaid Other

## 2016-05-20 ENCOUNTER — Telehealth: Payer: Self-pay | Admitting: Emergency Medicine

## 2016-07-07 ENCOUNTER — Encounter: Payer: Self-pay | Admitting: Emergency Medicine

## 2016-07-07 ENCOUNTER — Emergency Department
Admission: EM | Admit: 2016-07-07 | Discharge: 2016-07-07 | Disposition: A | Discharge status: Home / Self Care | Payer: Medicaid Other | Attending: Emergency Medicine | Admitting: Emergency Medicine

## 2016-07-07 DIAGNOSIS — R1084 Generalized abdominal pain: Secondary | ICD-10-CM

## 2016-07-07 DIAGNOSIS — R197 Diarrhea, unspecified: Secondary | ICD-10-CM

## 2016-07-07 DIAGNOSIS — Z79899 Other long term (current) drug therapy: Secondary | ICD-10-CM | POA: Diagnosis not present

## 2016-07-07 DIAGNOSIS — R1030 Lower abdominal pain, unspecified: Secondary | ICD-10-CM | POA: Diagnosis present

## 2016-07-07 DIAGNOSIS — A09 Infectious gastroenteritis and colitis, unspecified: Secondary | ICD-10-CM | POA: Diagnosis not present

## 2016-07-07 DIAGNOSIS — F17210 Nicotine dependence, cigarettes, uncomplicated: Secondary | ICD-10-CM | POA: Insufficient documentation

## 2016-07-07 DIAGNOSIS — J45909 Unspecified asthma, uncomplicated: Secondary | ICD-10-CM | POA: Diagnosis not present

## 2016-07-07 LAB — URINALYSIS, COMPLETE (UACMP) WITH MICROSCOPIC
Bacteria, UA: NONE SEEN
Bilirubin Urine: NEGATIVE
Glucose, UA: NEGATIVE mg/dL
Hgb urine dipstick: NEGATIVE
KETONES UR: NEGATIVE mg/dL
Leukocytes, UA: NEGATIVE
Nitrite: NEGATIVE
PROTEIN: NEGATIVE mg/dL
Specific Gravity, Urine: 1.02 (ref 1.005–1.030)
pH: 6 (ref 5.0–8.0)

## 2016-07-07 LAB — COMPREHENSIVE METABOLIC PANEL
ALBUMIN: 4 g/dL (ref 3.5–5.0)
ALT: 11 U/L — AB (ref 14–54)
AST: 23 U/L (ref 15–41)
Alkaline Phosphatase: 86 U/L (ref 38–126)
Anion gap: 9 (ref 5–15)
BUN: 10 mg/dL (ref 6–20)
CO2: 22 mmol/L (ref 22–32)
CREATININE: 0.69 mg/dL (ref 0.44–1.00)
Calcium: 8.9 mg/dL (ref 8.9–10.3)
Chloride: 105 mmol/L (ref 101–111)
GFR calc non Af Amer: >60 mL/min (ref >60)
GLUCOSE: 185 mg/dL — AB (ref 65–99)
Potassium: 3.5 mmol/L (ref 3.5–5.1)
SODIUM: 136 mmol/L (ref 135–145)
Total Bilirubin: 0.8 mg/dL (ref 0.3–1.2)
Total Protein: 7.1 g/dL (ref 6.5–8.1)

## 2016-07-07 LAB — CBC
HCT: 39.8 % (ref 35.0–47.0)
Hemoglobin: 14 g/dL (ref 12.0–16.0)
MCH: 32.5 pg (ref 26.0–34.0)
MCHC: 35.2 g/dL (ref 32.0–36.0)
MCV: 92.3 fL (ref 80.0–100.0)
PLATELETS: 337 10*3/uL (ref 150–440)
RBC: 4.32 MIL/uL (ref 3.80–5.20)
RDW: 14.5 % (ref 11.5–14.5)
WBC: 7.1 10*3/uL (ref 3.6–11.0)

## 2016-07-07 LAB — LIPASE, BLOOD: LIPASE: 18 U/L (ref 11–51)

## 2016-07-07 MED ORDER — DICYCLOMINE HCL 10 MG/ML IM SOLN
20.0000 mg | Freq: Once | INTRAMUSCULAR | Status: DC
  Filled 2016-07-07: qty 2

## 2016-07-07 MED ORDER — HALOPERIDOL LACTATE 5 MG/ML IJ SOLN
2.5000 mg | Freq: Once | INTRAMUSCULAR | Status: AC
  Administered 2016-07-07: 2.5 mg via INTRAVENOUS
  Filled 2016-07-07: qty 1

## 2016-07-07 MED ORDER — SODIUM CHLORIDE 0.9 % IV BOLUS (SEPSIS)
1000.0000 mL | Freq: Once | INTRAVENOUS | Status: AC
  Administered 2016-07-07: 1000 mL via INTRAVENOUS

## 2016-07-07 MED ORDER — CIPROFLOXACIN HCL 500 MG PO TABS
750.0000 mg | ORAL_TABLET | Freq: Once | ORAL | Status: AC
  Administered 2016-07-07: 750 mg via ORAL
  Filled 2016-07-07: qty 2

## 2016-07-07 MED ORDER — LOPERAMIDE HCL 2 MG PO TABS: 2.0000 mg | ORAL_TABLET | Freq: Four times a day (QID) | ORAL | 0 refills | Status: DC | PRN

## 2016-07-07 MED ORDER — GI COCKTAIL ~~LOC~~
30.0000 mL | Freq: Once | ORAL | Status: AC
  Administered 2016-07-07: 30 mL via ORAL
  Filled 2016-07-07: qty 30

## 2016-07-07 MED ORDER — FAMOTIDINE 40 MG PO TABS: 40.0000 mg | ORAL_TABLET | Freq: Every evening | ORAL | 0 refills | Status: DC

## 2016-07-07 MED ORDER — FAMOTIDINE 20 MG PO TABS
40.0000 mg | ORAL_TABLET | Freq: Once | ORAL | Status: AC
  Administered 2016-07-07: 40 mg via ORAL
  Filled 2016-07-07: qty 2

## 2016-07-07 NOTE — ED Notes (Signed)
Pt denies vomiting since taking PO medications. Pt ambulatory to toilet with Lyla Sonarrie, EDT.

## 2016-07-07 NOTE — ED Provider Notes (Signed)
[facility] Emergency Department Provider Note  ____________________________________________   None    (approximate)  I have reviewed the triage vital signs and the nursing notes.   HISTORY  Chief Complaint Abdominal Pain    HPI Shawna [name] is a 51 y.o. female who comes to the emergency department with moderate severity cramping lower abdominal discomfort and several loose stools after eating supermarket sweet and sour chicken stir fry last night. She is worried that she may have salmonella poisoning as she had Salmonella several years ago.She has no abdominal surgical history. She does have a long-standing history of gastric reflux. She denies chest pain or shortness of breath. She denies dysuria frequency or hesitancy. Her pain is moderate severity cramping lower abdominal nonradiating. Nothing makes it better or worse. She has not had a bowel movement in over 6 hours.   Past Medical History:  Diagnosis Date  . Asthma   . Bulging lumbar disc   . Bulging lumbar disc   . GERD (gastroesophageal reflux disease)   . Insomnia   . Kidney stone   . Pneumonia DEC 2016  . Sciatic leg pain    Right side  . Sciatica of right side   . Weakness    Bil knees, and ankles    Patient Active Problem List   Diagnosis Date Noted  . Bacterial pneumonia 01/23/2015  . Leukocytosis 01/23/2015  . Sepsis (HCC) 01/23/2015  . Hypoxemia 01/23/2015  . Opioid use disorder, moderate, dependence (HCC) 12/31/2014  . Anxiety disorder, unspecified 12/31/2014    Past Surgical History:  Procedure Laterality Date  . ABDOMINAL HYSTERECTOMY     Partial  . ESOPHAGOGASTRODUODENOSCOPY (EGD) WITH PROPOFOL N/A 11/27/2015   Procedure: ESOPHAGOGASTRODUODENOSCOPY (EGD) WITH PROPOFOL;  Surgeon: Christena DeemMartin U Skulskie, MD;  Location: [facility] ENDOSCOPY;  Service: Endoscopy;  Laterality: N/A;  . SHOULDER SURGERY Right   . TONSILLECTOMY    . TUBAL LIGATION      Prior to Admission medications     Medication Sig Start Date End Date Taking? Authorizing Provider  acetaminophen (TYLENOL) 500 MG tablet Take 500 mg by mouth every 6 (six) hours as needed.   Yes Historical Provider, MD  albuterol (PROVENTIL HFA;VENTOLIN HFA) 108 (90 Base) MCG/ACT inhaler Inhale 2 puffs into the lungs every 6 (six) hours as needed for wheezing or shortness of breath. 01/16/16  Yes Jennye MoccasinBrian S Quigley, MD  clonazePAM (KLONOPIN) 1 MG tablet Take 1 mg by mouth 2 (two) times daily. Reported on 10/28/2015   Yes Historical Provider, MD  dexlansoprazole (DEXILANT) 60 MG capsule Take 1 capsule by mouth daily.   Yes Historical Provider, MD  fluticasone (FLONASE) 50 MCG/ACT nasal spray Place 1 spray into both nostrils daily.   Yes Historical Provider, MD  hydrOXYzine (ATARAX/VISTARIL) 50 MG tablet Take 50 mg by mouth every 6 (six) hours as needed.   Yes Historical Provider, MD  ondansetron (ZOFRAN ODT) 4 MG disintegrating tablet Take 1 tablet (4 mg total) by mouth every 8 (eight) hours as needed for nausea or vomiting. 04/09/15  Yes Darci Currentandolph N Brown, MD  sucralfate (CARAFATE) 1 g tablet Take 1 g by mouth 4 (four) times daily -  with meals and at bedtime.   Yes Historical Provider, MD  zolpidem (AMBIEN) 10 MG tablet Take 10 mg by mouth at bedtime.   Yes Historical Provider, MD  aluminum-magnesium hydroxide 200-200 MG/5ML suspension Take 10 mLs by mouth every 6 (six) hours as needed for indigestion. Patient not taking: Reported on 07/07/2016  12/14/15   Nita Sickle, MD  docusate sodium (COLACE) 100 MG capsule Take 1 tablet once or twice daily as needed for constipation while taking narcotic pain medicine Patient not taking: Reported on 07/07/2016 11/23/15   Loleta Rose, MD  famotidine (PEPCID) 40 MG tablet Take 1 tablet (40 mg total) by mouth every evening. 07/07/16 07/07/17  Merrily Brittle, MD  loperamide (LOPERAMIDE A-D) 2 MG tablet Take 1 tablet (2 mg total) by mouth 4 (four) times daily as needed for diarrhea or loose stools.  07/07/16   Merrily Brittle, MD  oxyCODONE-acetaminophen (ROXICET) 5-325 MG tablet Take 1-2 tablets by mouth every 4 (four) hours as needed for severe pain. Patient not taking: Reported on 11/27/2015 11/23/15   Loleta Rose, MD  predniSONE (DELTASONE) 20 MG tablet Take 2 tablets (40 mg total) by mouth daily. Patient not taking: Reported on 07/07/2016 01/16/16   Jennye Moccasin, MD  traMADol (ULTRAM) 50 MG tablet Take 1 tablet (50 mg total) by mouth every 6 (six) hours as needed for severe pain. Patient not taking: Reported on 11/27/2015 08/21/15 08/20/16  Maurilio Lovely, MD    Allergies Ciprofloxacin hcl; Fentanyl; Morphine and related; Propofol; and Vicodin [hydrocodone-acetaminophen]  Family History  Problem Relation Age of Onset  . Fibromyalgia Mother     Social History Social History  Substance Use Topics  . Smoking status: Current Every Day Smoker    Packs/day: 0.50    Years: 40.00    Types: Cigarettes  . Smokeless tobacco: Current User  . Alcohol use No    Review of Systems Constitutional: No fever/chills Eyes: No visual changes. ENT: No sore throat. Cardiovascular: Denies chest pain. Respiratory: Denies shortness of breath. Gastrointestinal: Positive abdominal pain.  Positive nausea, no vomiting.  Positive diarrhea.  No constipation. Genitourinary: Negative for dysuria. Musculoskeletal: Negative for back pain. Skin: Negative for rash. Neurological: Negative for headaches, focal weakness or numbness.  10-point ROS otherwise negative.  ____________________________________________   PHYSICAL EXAM:  VITAL SIGNS: ED Triage Vitals  Enc Vitals Group     BP 07/07/16 1210 111/84     Pulse Rate 07/07/16 1210 (!) 110     Resp 07/07/16 1210 16     Temp 07/07/16 1210 98 F (36.7 C)     Temp Source 07/07/16 1210 Oral     SpO2 07/07/16 1210 95 %     Weight 07/07/16 1211 140 lb (63.5 kg)     Height 07/07/16 1211 5\' 6"  (1.676 m)     Head Circumference --      Peak Flow --       Pain Score 07/07/16 1212 8     Pain Loc --      Pain Edu? --      Excl. in GC? --     Constitutional: Alert and oriented x 4 well appearing nontoxic no diaphoresis speaks in full, clear sentences Eyes: PERRL EOMI. Head: Atraumatic. Nose: No congestion/rhinnorhea. Mouth/Throat: No trismus Neck: No stridor.   Cardiovascular: Normal rate, regular rhythm. Grossly normal heart sounds.  Good peripheral circulation. Respiratory: Normal respiratory effort.  No retractions. Lungs CTAB and moving good air Gastrointestinal: Soft nondistended nontender no rebound no guarding no peritonitis no McBurney's tenderness negative Rovsing's no costovertebral tenderness negative Murphy's Musculoskeletal: No lower extremity edema   Neurologic:  Normal speech and language. No gross focal neurologic deficits are appreciated. Skin:  Skin is warm, dry and intact. No rash noted. Psychiatric: Somewhat anxious appearing    ____________________________________________   DIFFERENTIAL  Salmonella,  infectious diarrhea, viral diarrhea, appendicitis, diverticulitis, anxiety reaction ____________________________________________   LABS (all labs ordered are listed, but only abnormal results are displayed)  Labs Reviewed  COMPREHENSIVE METABOLIC PANEL - Abnormal; Notable for the following:       Result Value   Glucose, Bld 185 (*)    ALT 11 (*)    All other components within normal limits  URINALYSIS, COMPLETE (UACMP) WITH MICROSCOPIC - Abnormal; Notable for the following:    Color, Urine YELLOW (*)    APPearance CLEAR (*)    Squamous Epithelial / LPF 0-5 (*)    All other components within normal limits  GASTROINTESTINAL PANEL BY PCR, STOOL (REPLACES STOOL CULTURE)  LIPASE, BLOOD  CBC   No signs of infection  __________________________________________  EKG  ED ECG REPORT I, Merrily Brittle, the attending physician, personally viewed and interpreted this ECG.  Date: 07/07/2016 Rate: 66 Rhythm:  normal sinus rhythm QRS Axis: normal Intervals: normal ST/T Wave abnormalities: normal Conduction Disturbances: none Narrative Interpretation: unremarkable  ____________________________________________  RADIOLOGY   ____________________________________________   PROCEDURES  Procedure(s) performed: no  Procedures  Critical Care performed: no  ____________________________________________   INITIAL IMPRESSION / ASSESSMENT AND PLAN / ED COURSE  Pertinent labs & imaging results that were available during my care of the patient were reviewed by me and considered in my medical decision making (see chart for details).       ----------------------------------------- 3:09 PM on 07/07/2016 -----------------------------------------  Fortunately the patient's pain is significantly improved after intravenous haloperidol and fluids. She has been unable to give a stool sample over the past several hours which is not consistent with salmonella.  Given her single dose of ciprofloxacin for presumptive infectious diarrhea and will discharge her home with conservative management. She is discharged home in good condition. ____________________________________________   FINAL CLINICAL IMPRESSION(S) / ED DIAGNOSES  Final diagnoses:  Generalized abdominal pain  Diarrhea of presumed infectious origin      NEW MEDICATIONS STARTED DURING THIS VISIT:  New Prescriptions   FAMOTIDINE (PEPCID) 40 MG TABLET    Take 1 tablet (40 mg total) by mouth every evening.   LOPERAMIDE (LOPERAMIDE A-D) 2 MG TABLET    Take 1 tablet (2 mg total) by mouth 4 (four) times daily as needed for diarrhea or loose stools.     Note:  This document was prepared using Dragon voice recognition software and may include unintentional dictation errors.     Merrily Brittle, MD 07/07/16 [phone]

## 2016-07-07 NOTE — ED Notes (Signed)
Pt states on Friday for supper ate frozen stir fry from grocery store, did not look at expiration date. States "I believe I have salmonella." Pt states N&V&D. Took zofran at home. States "I know I've had a fever and I've been dry swallowing tylenol." Has not taken temperature at home d/t not having thermometer. States chills and sweats. Last dose tylenol last night. Pt alert and oriented.

## 2016-07-07 NOTE — ED Triage Notes (Signed)
Patient presents via POV with c/o abdominal pain. Patient states, "I think I have salmonella poisoning." Hx of same. Patient states she ate stir fry on Friday night and has not been able to eat since. Patient also c/o diarrhea.

## 2016-07-07 NOTE — Discharge Instructions (Signed)
Please follow-up with your primary care physician tomorrow for a recheck. Return to the emergency department tomorrow for reevaluation if you're unable to get an appointment to see her primary care physician. Return to the ER sooner if you develop any new or worsening symptoms such as worsening pain, or for any other concerns.  It was a pleasure to take care of you today, and thank you for coming to our emergency department.  If you have any questions or concerns before leaving please ask the nurse to grab me and I'm more than happy to go through your aftercare instructions again.  If you were prescribed any opioid pain medication today such as Norco, Vicodin, Percocet, morphine, hydrocodone, or oxycodone please make sure you do not drive when you are taking this medication as it can alter your ability to drive safely.  If you have any concerns once you are home that you are not improving or are in fact getting worse before you can make it to your follow-up appointment, please do not hesitate to call [phone] and come back for further evaluation.  Merrily Brittle MD  Results for orders placed or performed during the hospital encounter of 07/07/16  Lipase, blood  Result Value Ref Range   Lipase 18 11 - 51 U/L  Comprehensive metabolic panel  Result Value Ref Range   Sodium 136 135 - 145 mmol/L   Potassium 3.5 3.5 - 5.1 mmol/L   Chloride 105 101 - 111 mmol/L   CO2 22 22 - 32 mmol/L   Glucose, Bld 185 (H) 65 - 99 mg/dL   BUN 10 6 - 20 mg/dL   Creatinine, Ser 1.61 0.44 - 1.00 mg/dL   Calcium 8.9 8.9 - 09.6 mg/dL   Total Protein 7.1 6.5 - 8.1 g/dL   Albumin 4.0 3.5 - 5.0 g/dL   AST 23 15 - 41 U/L   ALT 11 (L) 14 - 54 U/L   Alkaline Phosphatase 86 38 - 126 U/L   Total Bilirubin 0.8 0.3 - 1.2 mg/dL   GFR calc non Af Amer >60 >60 mL/min   GFR calc Af Amer >60 >60 mL/min   Anion gap 9 5 - 15  CBC  Result Value Ref Range   WBC 7.1 3.6 - 11.0 K/uL   RBC 4.32 3.80 - 5.20 MIL/uL   Hemoglobin 14.0  12.0 - 16.0 g/dL   HCT 04.5 40.9 - 81.1 %   MCV 92.3 80.0 - 100.0 fL   MCH 32.5 26.0 - 34.0 pg   MCHC 35.2 32.0 - 36.0 g/dL   RDW 91.4 78.2 - 95.6 %   Platelets 337 150 - 440 K/uL  Urinalysis, Complete w Microscopic  Result Value Ref Range   Color, Urine YELLOW (A) YELLOW   APPearance CLEAR (A) CLEAR   Specific Gravity, Urine 1.020 1.005 - 1.030   pH 6.0 5.0 - 8.0   Glucose, UA NEGATIVE NEGATIVE mg/dL   Hgb urine dipstick NEGATIVE NEGATIVE   Bilirubin Urine NEGATIVE NEGATIVE   Ketones, ur NEGATIVE NEGATIVE mg/dL   Protein, ur NEGATIVE NEGATIVE mg/dL   Nitrite NEGATIVE NEGATIVE   Leukocytes, UA NEGATIVE NEGATIVE   RBC / HPF 0-5 0 - 5 RBC/hpf   WBC, UA 0-5 0 - 5 WBC/hpf   Bacteria, UA NONE SEEN NONE SEEN   Squamous Epithelial / LPF 0-5 (A) NONE SEEN   Mucous PRESENT

## 2016-07-12 ENCOUNTER — Emergency Department: Payer: Medicaid Other

## 2016-07-12 ENCOUNTER — Emergency Department
Admission: EM | Admit: 2016-07-12 | Discharge: 2016-07-12 | Disposition: A | Discharge status: Home / Self Care | Payer: Medicaid Other | Attending: Emergency Medicine | Admitting: Emergency Medicine

## 2016-07-12 ENCOUNTER — Encounter: Payer: Self-pay | Admitting: Emergency Medicine

## 2016-07-12 DIAGNOSIS — J45909 Unspecified asthma, uncomplicated: Secondary | ICD-10-CM | POA: Diagnosis not present

## 2016-07-12 DIAGNOSIS — Z79899 Other long term (current) drug therapy: Secondary | ICD-10-CM | POA: Diagnosis not present

## 2016-07-12 DIAGNOSIS — R1032 Left lower quadrant pain: Secondary | ICD-10-CM

## 2016-07-12 DIAGNOSIS — F17210 Nicotine dependence, cigarettes, uncomplicated: Secondary | ICD-10-CM | POA: Insufficient documentation

## 2016-07-12 DIAGNOSIS — R110 Nausea: Secondary | ICD-10-CM | POA: Insufficient documentation

## 2016-07-12 LAB — COMPREHENSIVE METABOLIC PANEL
ALK PHOS: 88 U/L (ref 38–126)
ALT: 8 U/L — AB (ref 14–54)
ANION GAP: 6 (ref 5–15)
AST: 18 U/L (ref 15–41)
Albumin: 3.9 g/dL (ref 3.5–5.0)
BUN: 7 mg/dL (ref 6–20)
CHLORIDE: 108 mmol/L (ref 101–111)
CO2: 23 mmol/L (ref 22–32)
Calcium: 8.8 mg/dL — ABNORMAL LOW (ref 8.9–10.3)
Creatinine, Ser: 0.72 mg/dL (ref 0.44–1.00)
Glucose, Bld: 104 mg/dL — ABNORMAL HIGH (ref 65–99)
Potassium: 3.7 mmol/L (ref 3.5–5.1)
SODIUM: 137 mmol/L (ref 135–145)
Total Bilirubin: 0.5 mg/dL (ref 0.3–1.2)
Total Protein: 7.2 g/dL (ref 6.5–8.1)

## 2016-07-12 LAB — URINALYSIS, COMPLETE (UACMP) WITH MICROSCOPIC
Bacteria, UA: NONE SEEN
Bilirubin Urine: NEGATIVE
GLUCOSE, UA: NEGATIVE mg/dL
Hgb urine dipstick: NEGATIVE
KETONES UR: NEGATIVE mg/dL
Leukocytes, UA: NEGATIVE
Nitrite: NEGATIVE
Protein, ur: NEGATIVE mg/dL
Specific Gravity, Urine: 1.009 (ref 1.005–1.030)
pH: 7 (ref 5.0–8.0)

## 2016-07-12 LAB — CBC
HCT: 40.3 % (ref 35.0–47.0)
HEMOGLOBIN: 14 g/dL (ref 12.0–16.0)
MCH: 32 pg (ref 26.0–34.0)
MCHC: 34.7 g/dL (ref 32.0–36.0)
MCV: 92.1 fL (ref 80.0–100.0)
Platelets: 389 10*3/uL (ref 150–440)
RBC: 4.37 MIL/uL (ref 3.80–5.20)
RDW: 14.3 % (ref 11.5–14.5)
WBC: 8.2 10*3/uL (ref 3.6–11.0)

## 2016-07-12 LAB — TROPONIN I: Troponin I: <0.03 ng/mL (ref <0.03)

## 2016-07-12 LAB — LIPASE, BLOOD: Lipase: 22 U/L (ref 11–51)

## 2016-07-12 MED ORDER — ONDANSETRON HCL 4 MG/2ML IJ SOLN
4.0000 mg | Freq: Once | INTRAMUSCULAR | Status: AC
  Administered 2016-07-12: 4 mg via INTRAVENOUS

## 2016-07-12 MED ORDER — HYDROMORPHONE HCL 1 MG/ML IJ SOLN
INTRAMUSCULAR | Status: AC
  Filled 2016-07-12: qty 1

## 2016-07-12 MED ORDER — SODIUM CHLORIDE 0.9 % IV BOLUS (SEPSIS)
1000.0000 mL | Freq: Once | INTRAVENOUS | Status: AC
  Administered 2016-07-12: 1000 mL via INTRAVENOUS

## 2016-07-12 MED ORDER — ONDANSETRON HCL 4 MG/2ML IJ SOLN
INTRAMUSCULAR | Status: AC
  Filled 2016-07-12: qty 2

## 2016-07-12 MED ORDER — IOPAMIDOL (ISOVUE-300) INJECTION 61%
30.0000 mL | Freq: Once | INTRAVENOUS | Status: AC | PRN
  Administered 2016-07-12: 30 mL via ORAL
  Filled 2016-07-12: qty 30

## 2016-07-12 MED ORDER — TRAMADOL HCL 50 MG PO TABS
100.0000 mg | ORAL_TABLET | Freq: Once | ORAL | Status: AC
  Administered 2016-07-12: 100 mg via ORAL
  Filled 2016-07-12: qty 2

## 2016-07-12 MED ORDER — IOPAMIDOL (ISOVUE-300) INJECTION 61%
100.0000 mL | Freq: Once | INTRAVENOUS | Status: AC | PRN
  Administered 2016-07-12: 100 mL via INTRAVENOUS
  Filled 2016-07-12: qty 100

## 2016-07-12 MED ORDER — HYDROMORPHONE HCL 1 MG/ML IJ SOLN
0.5000 mg | Freq: Once | INTRAMUSCULAR | Status: AC
  Administered 2016-07-12: 0.5 mg via INTRAVENOUS

## 2016-07-12 MED ORDER — TRAMADOL HCL 50 MG PO TABS: 50.0000 mg | ORAL_TABLET | Freq: Four times a day (QID) | ORAL | 0 refills | Status: DC | PRN

## 2016-07-12 NOTE — ED Provider Notes (Signed)
[facility] Emergency Department Provider Note  Time seen: 5:43 PM  I have reviewed the triage vital signs and the nursing notes.   HISTORY  Chief Complaint Abdominal Pain and Chest Pain    HPI Shawna [name] is a 51 y.o. female with a past medical history of asthma, gastric reflux, kidney stones, presents the emergency department left lower quadrant abdominal pain. According to the patient since 07/02/16 she has been expressing left lower quadrant abdominal pain. States nausea but denies vomiting, does state occasional episodes of loose stool but denies black or bloody stool. Denies dysuria or hematuria. Denies vaginal bleeding or discharge. Patient does state subjective chills but has not measured a temperature. Currently describes her pain as moderate-to-severe located in left lower quadrant.  Past Medical History:  Diagnosis Date  . Asthma   . Bulging lumbar disc   . Bulging lumbar disc   . GERD (gastroesophageal reflux disease)   . Insomnia   . Kidney stone   . Pneumonia DEC 2016  . Sciatic leg pain    Right side  . Sciatica of right side   . Weakness    Bil knees, and ankles    Patient Active Problem List   Diagnosis Date Noted  . Bacterial pneumonia 01/23/2015  . Leukocytosis 01/23/2015  . Sepsis (HCC) 01/23/2015  . Hypoxemia 01/23/2015  . Opioid use disorder, moderate, dependence (HCC) 12/31/2014  . Anxiety disorder, unspecified 12/31/2014    Past Surgical History:  Procedure Laterality Date  . ABDOMINAL HYSTERECTOMY     Partial  . ESOPHAGOGASTRODUODENOSCOPY (EGD) WITH PROPOFOL N/A 11/27/2015   Procedure: ESOPHAGOGASTRODUODENOSCOPY (EGD) WITH PROPOFOL;  Surgeon: Christena DeemMartin U Skulskie, MD;  [facility];  Service: Endoscopy;  Laterality: N/A;  . SHOULDER SURGERY Right   . TONSILLECTOMY    . TUBAL LIGATION      Prior to Admission medications   Medication Sig Start Date End Date Taking? Authorizing Provider  acetaminophen (TYLENOL)  500 MG tablet Take 500 mg by mouth every 6 (six) hours as needed.    Historical Provider, MD  albuterol (PROVENTIL HFA;VENTOLIN HFA) 108 (90 Base) MCG/ACT inhaler Inhale 2 puffs into the lungs every 6 (six) hours as needed for wheezing or shortness of breath. 01/16/16   Jennye MoccasinBrian S Quigley, MD  aluminum-magnesium hydroxide 200-200 MG/5ML suspension Take 10 mLs by mouth every 6 (six) hours as needed for indigestion. Patient not taking: Reported on 07/07/2016 12/14/15   Nita Sicklearolina Veronese, MD  clonazePAM (KLONOPIN) 1 MG tablet Take 1 mg by mouth 2 (two) times daily. Reported on 10/28/2015    Historical Provider, MD  dexlansoprazole (DEXILANT) 60 MG capsule Take 1 capsule by mouth daily.    Historical Provider, MD  docusate sodium (COLACE) 100 MG capsule Take 1 tablet once or twice daily as needed for constipation while taking narcotic pain medicine Patient not taking: Reported on 07/07/2016 11/23/15   Loleta Roseory Forbach, MD  famotidine (PEPCID) 40 MG tablet Take 1 tablet (40 mg total) by mouth every evening. 07/07/16 07/07/17  Merrily BrittleNeil Rifenbark, MD  fluticasone (FLONASE) 50 MCG/ACT nasal spray Place 1 spray into both nostrils daily.    Historical Provider, MD  hydrOXYzine (ATARAX/VISTARIL) 50 MG tablet Take 50 mg by mouth every 6 (six) hours as needed.    Historical Provider, MD  loperamide (LOPERAMIDE A-D) 2 MG tablet Take 1 tablet (2 mg total) by mouth 4 (four) times daily as needed for diarrhea or loose stools. 07/07/16   Merrily BrittleNeil Rifenbark, MD  ondansetron [facility](ZOFRAN  ODT) 4 MG disintegrating tablet Take 1 tablet (4 mg total) by mouth every 8 (eight) hours as needed for nausea or vomiting. 04/09/15   Darci Current, MD  oxyCODONE-acetaminophen (ROXICET) 5-325 MG tablet Take 1-2 tablets by mouth every 4 (four) hours as needed for severe pain. Patient not taking: Reported on 11/27/2015 11/23/15   Loleta Rose, MD  predniSONE (DELTASONE) 20 MG tablet Take 2 tablets (40 mg total) by mouth daily. Patient not taking: Reported on  07/07/2016 01/16/16   Jennye Moccasin, MD  sucralfate (CARAFATE) 1 g tablet Take 1 g by mouth 4 (four) times daily -  with meals and at bedtime.    Historical Provider, MD  traMADol (ULTRAM) 50 MG tablet Take 1 tablet (50 mg total) by mouth every 6 (six) hours as needed for severe pain. Patient not taking: Reported on 11/27/2015 08/21/15 08/20/16  Maurilio Lovely, MD  zolpidem (AMBIEN) 10 MG tablet Take 10 mg by mouth at bedtime.    Historical Provider, MD    Allergies  Allergen Reactions  . Ciprofloxacin Hcl Nausea Only  . Fentanyl Nausea And Vomiting and Other (See Comments)    Patient states this makes her have severe flu symptoms.  . Haldol [Haloperidol]     Made pt "feel really weird".  . Morphine And Related Nausea And Vomiting    Patient states this medication makes her have sever flu symptoms.  . Propofol   . Vicodin [Hydrocodone-Acetaminophen] Nausea And Vomiting    Family History  Problem Relation Age of Onset  . Fibromyalgia Mother     Social History Social History  Substance Use Topics  . Smoking status: Current Every Day Smoker    Packs/day: 0.50    Years: 40.00    Types: Cigarettes  . Smokeless tobacco: Current User  . Alcohol use No    Review of Systems Constitutional: Negative for fever. Cardiovascular: Negative for chest pain. Respiratory: Negative for shortness of breath. Gastrointestinal: Left lower quadrant abdominal pain. Positive for nausea. Negative for vomiting. Genitourinary: Negative for dysuria. Negative for hematuria. Neurological: Negative for headache 10-point ROS otherwise negative.  ____________________________________________   PHYSICAL EXAM:  VITAL SIGNS: ED Triage Vitals [07/12/16 1521]  Enc Vitals Group     BP 119/87     Pulse Rate 88     Resp 18     Temp 98.4 F (36.9 C)     Temp Source Oral     SpO2 98 %     Weight 140 lb (63.5 kg)     Height 5\' 6"  (1.676 m)     Head Circumference      Peak Flow      Pain Score 9      Pain Loc      Pain Edu?      Excl. in GC?     Constitutional: Alert and oriented. Well appearing and in no distress. Eyes: Normal exam ENT   Head: Normocephalic and atraumatic   Mouth/Throat: Mucous membranes are moist. Cardiovascular: Normal rate, regular rhythm. No murmur Respiratory: Normal respiratory effort without tachypnea nor retractions. Breath sounds are clear Gastrointestinal: Soft and nontender. No distention.  Musculoskeletal: Nontender with normal range of motion in all extremities. Neurologic:  Normal speech and language. No gross focal neurologic deficits  Skin:  Skin is warm, dry and intact.  Psychiatric: Mood and affect are normal.  ____________________________________________    EKG  EKG reviewed and interpreted by myself shows normal sinus rhythm at 80 bpm, narrow QRS, normal  axis, normal intervals, no ST changes.  ____________________________________________    RADIOLOGY  Chest x-ray normal  ____________________________________________   INITIAL IMPRESSION / ASSESSMENT AND PLAN / ED COURSE  Pertinent labs & imaging results that were available during my care of the patient were reviewed by me and considered in my medical decision making (see chart for details).  Patient presents the emergency department with left lower quadrant abdominal pain for the past one week. Patient's labs are normal. Patient does have mild to moderate left lower quadrant abdominal tenderness palpation. Denies any prior history of diverticulitis or colitis. Given the patient's continued abdominal pain and as this is her second emergency department visit we'll obtain a CT scan of the abdomen/pelvis to further evaluate. Patient is agreeable.  CT scan is normal. Patient's labs are normal. Given the patient's normal CT and labs we will refer to GI medicine for further workup. We will dose Ultram in the emergency department and discharged with the  same.  ____________________________________________   FINAL CLINICAL IMPRESSION(S) / ED DIAGNOSES  Abdominal pain    Minna Antis, MD 07/12/16 [phone]

## 2016-07-12 NOTE — ED Notes (Signed)
Pt requesting additional pain medication. md notified, no new orders received. Pt updated on md's notification.

## 2016-07-12 NOTE — ED Triage Notes (Signed)
Pt presents to ED via POV with c/o LUQ and LLQ abdominal pain that hurts into her chest intermittently. Pt states was seen here several days ago and sent home with dx of abdominal pain and diarrhea. Pt states symptoms have not resolved and have worsened. Pt also states she had not been able to get an appt with PCP.

## 2016-07-20 ENCOUNTER — Emergency Department
Admission: EM | Admit: 2016-07-20 | Discharge: 2016-07-20 | Disposition: A | Discharge status: Home / Self Care | Payer: Medicaid Other | Attending: Emergency Medicine | Admitting: Emergency Medicine

## 2016-07-20 ENCOUNTER — Encounter: Payer: Self-pay | Admitting: Emergency Medicine

## 2016-07-20 ENCOUNTER — Emergency Department: Payer: Medicaid Other

## 2016-07-20 DIAGNOSIS — Z79899 Other long term (current) drug therapy: Secondary | ICD-10-CM | POA: Insufficient documentation

## 2016-07-20 DIAGNOSIS — Y939 Activity, unspecified: Secondary | ICD-10-CM | POA: Insufficient documentation

## 2016-07-20 DIAGNOSIS — F17210 Nicotine dependence, cigarettes, uncomplicated: Secondary | ICD-10-CM | POA: Insufficient documentation

## 2016-07-20 DIAGNOSIS — W109XXA Fall (on) (from) unspecified stairs and steps, initial encounter: Secondary | ICD-10-CM | POA: Insufficient documentation

## 2016-07-20 DIAGNOSIS — J45909 Unspecified asthma, uncomplicated: Secondary | ICD-10-CM | POA: Diagnosis not present

## 2016-07-20 DIAGNOSIS — W19XXXA Unspecified fall, initial encounter: Secondary | ICD-10-CM

## 2016-07-20 DIAGNOSIS — M25511 Pain in right shoulder: Secondary | ICD-10-CM | POA: Diagnosis not present

## 2016-07-20 DIAGNOSIS — M545 Low back pain, unspecified: Secondary | ICD-10-CM

## 2016-07-20 DIAGNOSIS — M25551 Pain in right hip: Secondary | ICD-10-CM | POA: Insufficient documentation

## 2016-07-20 DIAGNOSIS — Y929 Unspecified place or not applicable: Secondary | ICD-10-CM | POA: Insufficient documentation

## 2016-07-20 DIAGNOSIS — Y999 Unspecified external cause status: Secondary | ICD-10-CM | POA: Insufficient documentation

## 2016-07-20 DIAGNOSIS — S79911A Unspecified injury of right hip, initial encounter: Secondary | ICD-10-CM | POA: Diagnosis present

## 2016-07-20 MED ORDER — TRAMADOL HCL 50 MG PO TABS
50.0000 mg | ORAL_TABLET | Freq: Once | ORAL | Status: AC
  Administered 2016-07-20: 50 mg via ORAL
  Filled 2016-07-20: qty 1

## 2016-07-20 MED ORDER — KETOROLAC TROMETHAMINE 30 MG/ML IJ SOLN
60.0000 mg | Freq: Once | INTRAMUSCULAR | Status: AC
  Administered 2016-07-20: 60 mg via INTRAMUSCULAR
  Filled 2016-07-20: qty 2

## 2016-07-20 MED ORDER — ONDANSETRON 4 MG PO TBDP
4.0000 mg | ORAL_TABLET | Freq: Once | ORAL | Status: AC
  Administered 2016-07-20: 4 mg via ORAL
  Filled 2016-07-20: qty 1

## 2016-07-20 NOTE — ED Provider Notes (Signed)
[facility] Emergency Department Provider Note   ____________________________________________   First MD Initiated Contact with Patient 07/20/16 [phone]     (approximate)  I have reviewed the triage vital signs and the nursing notes.   HISTORY  Chief Complaint Fall    HPI Joesph FillersWendy A [name] is a 51 y.o. female brought to the ED from home via EMS with a chief complaint of fall. Patient reports she is disabled; fell down approximately 6 steps this morning around 2:30 AM. Reports going to get a drink of water and stumbled. Denies striking head or LOC. Complains of right shoulder, lower back and right upper thigh pain. EMS reports patient was ambulatory at the scene. Denies extremity weakness, numbness or tingling. Denies headache, vision changes, neck pain, chest pain, shortness of breath, abdominal pain, nausea, vomiting, diarrhea. The makes her symptoms better. Movement makes her symptoms worse.   Past Medical History:  Diagnosis Date  . Asthma   . Bulging lumbar disc   . Bulging lumbar disc   . GERD (gastroesophageal reflux disease)   . Insomnia   . Kidney stone   . Pneumonia DEC 2016  . Sciatic leg pain    Right side  . Sciatica of right side   . Weakness    Bil knees, and ankles    Patient Active Problem List   Diagnosis Date Noted  . Bacterial pneumonia 01/23/2015  . Leukocytosis 01/23/2015  . Sepsis (HCC) 01/23/2015  . Hypoxemia 01/23/2015  . Opioid use disorder, moderate, dependence (HCC) 12/31/2014  . Anxiety disorder, unspecified 12/31/2014    Past Surgical History:  Procedure Laterality Date  . ABDOMINAL HYSTERECTOMY     Partial  . ESOPHAGOGASTRODUODENOSCOPY (EGD) WITH PROPOFOL N/A 11/27/2015   Procedure: ESOPHAGOGASTRODUODENOSCOPY (EGD) WITH PROPOFOL;  Surgeon: Christena DeemMartin U Skulskie, MD;  [facility];  Service: Endoscopy;  Laterality: N/A;  . SHOULDER SURGERY Right   . TONSILLECTOMY    . TUBAL LIGATION      Prior to  Admission medications   Medication Sig Start Date End Date Taking? Authorizing Provider  acetaminophen (TYLENOL) 500 MG tablet Take 500 mg by mouth every 6 (six) hours as needed.    Historical Provider, MD  albuterol (PROVENTIL HFA;VENTOLIN HFA) 108 (90 Base) MCG/ACT inhaler Inhale 2 puffs into the lungs every 6 (six) hours as needed for wheezing or shortness of breath. 01/16/16   Jennye MoccasinBrian S Quigley, MD  aluminum-magnesium hydroxide 200-200 MG/5ML suspension Take 10 mLs by mouth every 6 (six) hours as needed for indigestion. Patient not taking: Reported on 07/07/2016 12/14/15   Nita Sicklearolina Veronese, MD  clonazePAM (KLONOPIN) 1 MG tablet Take 1 mg by mouth 2 (two) times daily. Reported on 10/28/2015    Historical Provider, MD  dexlansoprazole (DEXILANT) 60 MG capsule Take 1 capsule by mouth daily.    Historical Provider, MD  docusate sodium (COLACE) 100 MG capsule Take 1 tablet once or twice daily as needed for constipation while taking narcotic pain medicine Patient not taking: Reported on 07/07/2016 11/23/15   Loleta Roseory Forbach, MD  famotidine (PEPCID) 40 MG tablet Take 1 tablet (40 mg total) by mouth every evening. 07/07/16 07/07/17  Merrily BrittleNeil Rifenbark, MD  fluticasone (FLONASE) 50 MCG/ACT nasal spray Place 1 spray into both nostrils daily.    Historical Provider, MD  hydrOXYzine (ATARAX/VISTARIL) 50 MG tablet Take 50 mg by mouth every 6 (six) hours as needed.    Historical Provider, MD  loperamide (LOPERAMIDE A-D) 2 MG tablet Take 1 tablet (2 mg total)  by mouth 4 (four) times daily as needed for diarrhea or loose stools. 07/07/16   Merrily Brittle, MD  ondansetron (ZOFRAN ODT) 4 MG disintegrating tablet Take 1 tablet (4 mg total) by mouth every 8 (eight) hours as needed for nausea or vomiting. 04/09/15   Darci Current, MD  oxyCODONE-acetaminophen (ROXICET) 5-325 MG tablet Take 1-2 tablets by mouth every 4 (four) hours as needed for severe pain. Patient not taking: Reported on 11/27/2015 11/23/15   Loleta Rose, MD    predniSONE (DELTASONE) 20 MG tablet Take 2 tablets (40 mg total) by mouth daily. Patient not taking: Reported on 07/07/2016 01/16/16   Jennye Moccasin, MD  sucralfate (CARAFATE) 1 g tablet Take 1 g by mouth 4 (four) times daily -  with meals and at bedtime.    Historical Provider, MD  traMADol (ULTRAM) 50 MG tablet Take 1 tablet (50 mg total) by mouth every 6 (six) hours as needed. 07/12/16   Minna Antis, MD  zolpidem (AMBIEN) 10 MG tablet Take 10 mg by mouth at bedtime.    Historical Provider, MD    Allergies Ciprofloxacin hcl; Fentanyl; Haldol [haloperidol]; Morphine and related; Propofol; and Vicodin [hydrocodone-acetaminophen]  Family History  Problem Relation Age of Onset  . Fibromyalgia Mother     Social History Social History  Substance Use Topics  . Smoking status: Current Every Day Smoker    Packs/day: 0.50    Years: 40.00    Types: Cigarettes  . Smokeless tobacco: Current User  . Alcohol use No    Review of Systems  Constitutional: No fever/chills. Eyes: No visual changes. ENT: No sore throat. Cardiovascular: Denies chest pain. Respiratory: Denies shortness of breath. Gastrointestinal: No abdominal pain.  No nausea, no vomiting.  No diarrhea.  No constipation. Genitourinary: Negative for dysuria. Musculoskeletal: Positive for right shoulder, back and right upper thigh pain. Skin: Negative for rash. Neurological: Negative for headaches, focal weakness or numbness.  10-point ROS otherwise negative.  ____________________________________________   PHYSICAL EXAM:  VITAL SIGNS: ED Triage Vitals  Enc Vitals Group     BP 07/20/16 0358 102/68     Pulse Rate 07/20/16 0358 90     Resp 07/20/16 0358 16     Temp 07/20/16 0358 98 F (36.7 C)     Temp Source 07/20/16 0358 Oral     SpO2 07/20/16 0358 97 %     Weight 07/20/16 0358 139 lb (63 kg)     Height 07/20/16 0358 5\' 6"  (1.676 m)     Head Circumference --      Peak Flow --      Pain Score 07/20/16 0402  10     Pain Loc --      Pain Edu? --      Excl. in GC? --     Constitutional: Alert and oriented. Well appearing and in no acute distress. Eyes: Conjunctivae are normal. PERRL. EOMI. Head: Atraumatic. Nose: No congestion/rhinnorhea. Mouth/Throat: Mucous membranes are moist.  Oropharynx non-erythematous. Neck: No stridor.  No cervical spine tenderness to palpation.  No step-offs or deformities noted. Cardiovascular: Normal rate, regular rhythm. Grossly normal heart sounds.  Good peripheral circulation. Respiratory: Normal respiratory effort.  No retractions. Lungs CTAB. Gastrointestinal: Soft and nontender. No distention. No abdominal bruits. No CVA tenderness. Musculoskeletal: Lumbar spine tender to palpation. No step-offs or deformities noted. Right upper arm tender to palpation. Full range of motion of right shoulder with pain. Pelvis stable. Right hip and upper thigh tender to palpation. Full range  of motion right hip without pain. No edema.  No joint effusions. Neurologic:  Normal speech and language. No gross focal neurologic deficits are appreciated.  Skin:  Skin is warm, dry and intact. No rash noted. Psychiatric: Mood and affect are normal. Speech and behavior are normal.  ____________________________________________   LABS (all labs ordered are listed, but only abnormal results are displayed)  Labs Reviewed - No data to display ____________________________________________  EKG  None ____________________________________________  RADIOLOGY  Right shoulder x-rays interpreted per Dr. Register: 1. Right shoulder replacement. Hardware intact. Anatomic alignment.  No acute abnormality.    2. Stable degenerative change .   Lumbar spine x-rays interpreted per Dr. Register: 1. Diffuse multilevel degenerative change. No acute bony  abnormality.    2. Stable left nephrolithiasis .   Right hip x-rays interpreted per Dr. Manus Gunning: Chronic avascular necrosis of both femoral  heads, right greater than  left. Minimal subchondral collapse on the right.    No evidence of acute fracture.   ____________________________________________   PROCEDURES  Procedure(s) performed: None  Procedures  Critical Care performed: No  ____________________________________________   INITIAL IMPRESSION / ASSESSMENT AND PLAN / ED COURSE  Pertinent labs & imaging results that were available during my care of the patient were reviewed by me and considered in my medical decision making (see chart for details).  51 year old female status post fall down 6 steps. Complains of orthopedic pain. No cervical spinal tenderness. No focal neurological deficits. Will obtain x-rays of the right shoulder, lumbar spine and right hip. G discussed use of analgesia as patient has opioid use disorder. Review of chart demonstrates she has had 2 recent visits with a prescription for tramadol filled 3/19.  I reviewed the patient's prescription history over the last 12 months in the multi-state controlled substances database(s) that includes [place], [place], [place], [place], [place], [place], [place], [place], [place], [place], [place], [place], [place], and [place].  Results were notable for Clonazepam 1mg  #90 filled 07/18/16, Tramadol 50mg  #10 filled 07/13/16, and Ambien 10mg  #30 filled 07/01/16. Clinical Course as of Jul 21 719  Mon Jul 20, 2016  0716 Updated patient of negative imaging results. Strict return precautions given. Patient verbalizes understanding and agrees with plan of care.  [JS]  [id number] Discussed with patient at length that she will need to speak with her PCP for additional opiate pain medications.  [JS]    Clinical Course User Index [JS] Irean Hong, MD     ____________________________________________   FINAL CLINICAL IMPRESSION(S) / ED DIAGNOSES  Final diagnoses:  Fall, initial encounter  Acute pain of right shoulder  Acute midline low  back pain without sciatica  Pain of right hip joint      NEW MEDICATIONS STARTED DURING THIS VISIT:  New Prescriptions   No medications on file     Note:  This document was prepared using Dragon voice recognition software and may include unintentional dictation errors.    Irean Hong, MD 07/20/16 [phone]

## 2016-07-20 NOTE — ED Notes (Signed)
Report to heather, rn

## 2016-07-20 NOTE — ED Notes (Signed)
Pt states she fell down 7 stairs hitting right shoulder, and right hip. Pt complains of low back pain, right shoulder and right hip pain. No obvious injury noted, no bruising or abrasions noted. Pt moving all extremities. Pt denies loc. resps unlabored.

## 2016-07-20 NOTE — ED Notes (Signed)
Patient to stat desk via wheelchair by EMS.  EMS reports patient fell at approximately 2 am.  Patient with history of sciatic and bulging disc and unsure if the pain is related to fall or chronic back pain.  EMS reports patient was able to ambulate on scene.

## 2016-07-20 NOTE — ED Triage Notes (Signed)
Pt states that she is disabled and fell down her steps this AM around 0230. Pt wants to make sure that she has not done more damage to her legs and back. Pt denies LOC or any head injury. Pt states that she has bulging disk and sciatica. Pt is in NAD at this time.

## 2016-07-20 NOTE — ED Notes (Signed)
md notified of pt's request for additional pain medication. No new orders received.

## 2016-07-20 NOTE — Discharge Instructions (Signed)
Apply ice to affected area several times daily. Return to the ER for worsening symptoms, persistent vomiting, difficulty breathing or other concerns.

## 2016-07-20 NOTE — ED Notes (Signed)
Pt states no change in pain level after medication. Will notify md, pt requesting additional pain medication.

## 2016-07-23 ENCOUNTER — Inpatient Hospital Stay
Admit: 2016-07-23 | Discharge: 2016-07-26 | DRG: 918 | Disposition: A | Discharge status: Home / Self Care | Payer: Medicaid Other | Source: Intra-hospital | Attending: Psychiatry | Admitting: Psychiatry

## 2016-07-23 ENCOUNTER — Encounter: Payer: Self-pay | Admitting: Psychiatry

## 2016-07-23 ENCOUNTER — Encounter: Payer: Self-pay | Admitting: Emergency Medicine

## 2016-07-23 ENCOUNTER — Emergency Department
Admission: EM | Admit: 2016-07-23 | Discharge: 2016-07-23 | Disposition: A | Discharge status: Other Acute Inpatient Hospital | Payer: Medicaid Other | Source: Home / Self Care | Attending: Emergency Medicine | Admitting: Emergency Medicine

## 2016-07-23 ENCOUNTER — Emergency Department: Payer: Medicaid Other

## 2016-07-23 DIAGNOSIS — K219 Gastro-esophageal reflux disease without esophagitis: Secondary | ICD-10-CM | POA: Diagnosis present

## 2016-07-23 DIAGNOSIS — G8929 Other chronic pain: Secondary | ICD-10-CM | POA: Diagnosis present

## 2016-07-23 DIAGNOSIS — T4271XA Poisoning by unspecified antiepileptic and sedative-hypnotic drugs, accidental (unintentional), initial encounter: Secondary | ICD-10-CM | POA: Diagnosis present

## 2016-07-23 DIAGNOSIS — Z881 Allergy status to other antibiotic agents status: Secondary | ICD-10-CM

## 2016-07-23 DIAGNOSIS — T50902A Poisoning by unspecified drugs, medicaments and biological substances, intentional self-harm, initial encounter: Secondary | ICD-10-CM

## 2016-07-23 DIAGNOSIS — Z79899 Other long term (current) drug therapy: Secondary | ICD-10-CM

## 2016-07-23 DIAGNOSIS — F1120 Opioid dependence, uncomplicated: Secondary | ICD-10-CM | POA: Diagnosis present

## 2016-07-23 DIAGNOSIS — K580 Irritable bowel syndrome with diarrhea: Secondary | ICD-10-CM | POA: Diagnosis present

## 2016-07-23 DIAGNOSIS — F1320 Sedative, hypnotic or anxiolytic dependence, uncomplicated: Secondary | ICD-10-CM | POA: Diagnosis present

## 2016-07-23 DIAGNOSIS — Z888 Allergy status to other drugs, medicaments and biological substances status: Secondary | ICD-10-CM

## 2016-07-23 DIAGNOSIS — R45851 Suicidal ideations: Secondary | ICD-10-CM | POA: Diagnosis present

## 2016-07-23 DIAGNOSIS — Z90710 Acquired absence of both cervix and uterus: Secondary | ICD-10-CM | POA: Diagnosis not present

## 2016-07-23 DIAGNOSIS — E538 Deficiency of other specified B group vitamins: Secondary | ICD-10-CM | POA: Diagnosis present

## 2016-07-23 DIAGNOSIS — T426X2A Poisoning by other antiepileptic and sedative-hypnotic drugs, intentional self-harm, initial encounter: Secondary | ICD-10-CM | POA: Diagnosis not present

## 2016-07-23 DIAGNOSIS — F411 Generalized anxiety disorder: Secondary | ICD-10-CM | POA: Diagnosis present

## 2016-07-23 DIAGNOSIS — Z79891 Long term (current) use of opiate analgesic: Secondary | ICD-10-CM | POA: Diagnosis not present

## 2016-07-23 DIAGNOSIS — Z885 Allergy status to narcotic agent status: Secondary | ICD-10-CM

## 2016-07-23 DIAGNOSIS — T43592A Poisoning by other antipsychotics and neuroleptics, intentional self-harm, initial encounter: Secondary | ICD-10-CM | POA: Diagnosis not present

## 2016-07-23 DIAGNOSIS — F332 Major depressive disorder, recurrent severe without psychotic features: Secondary | ICD-10-CM | POA: Diagnosis not present

## 2016-07-23 DIAGNOSIS — E781 Pure hyperglyceridemia: Secondary | ICD-10-CM | POA: Diagnosis present

## 2016-07-23 DIAGNOSIS — J441 Chronic obstructive pulmonary disease with (acute) exacerbation: Secondary | ICD-10-CM | POA: Diagnosis present

## 2016-07-23 DIAGNOSIS — G4700 Insomnia, unspecified: Secondary | ICD-10-CM | POA: Diagnosis present

## 2016-07-23 DIAGNOSIS — T1491XA Suicide attempt, initial encounter: Secondary | ICD-10-CM | POA: Diagnosis not present

## 2016-07-23 DIAGNOSIS — T424X2A Poisoning by benzodiazepines, intentional self-harm, initial encounter: Secondary | ICD-10-CM | POA: Diagnosis present

## 2016-07-23 DIAGNOSIS — F17200 Nicotine dependence, unspecified, uncomplicated: Secondary | ICD-10-CM | POA: Diagnosis present

## 2016-07-23 DIAGNOSIS — F32A Depression, unspecified: Secondary | ICD-10-CM

## 2016-07-23 DIAGNOSIS — Z716 Tobacco abuse counseling: Secondary | ICD-10-CM | POA: Diagnosis not present

## 2016-07-23 DIAGNOSIS — F17210 Nicotine dependence, cigarettes, uncomplicated: Secondary | ICD-10-CM | POA: Diagnosis present

## 2016-07-23 DIAGNOSIS — F419 Anxiety disorder, unspecified: Secondary | ICD-10-CM | POA: Diagnosis not present

## 2016-07-23 DIAGNOSIS — F1220 Cannabis dependence, uncomplicated: Secondary | ICD-10-CM | POA: Diagnosis present

## 2016-07-23 DIAGNOSIS — E785 Hyperlipidemia, unspecified: Secondary | ICD-10-CM | POA: Diagnosis present

## 2016-07-23 DIAGNOSIS — F329 Major depressive disorder, single episode, unspecified: Secondary | ICD-10-CM

## 2016-07-23 LAB — COMPREHENSIVE METABOLIC PANEL
ALT: 12 U/L — ABNORMAL LOW (ref 14–54)
ANION GAP: 7 (ref 5–15)
AST: 19 U/L (ref 15–41)
Albumin: 3.6 g/dL (ref 3.5–5.0)
Alkaline Phosphatase: 87 U/L (ref 38–126)
BUN: 12 mg/dL (ref 6–20)
CHLORIDE: 110 mmol/L (ref 101–111)
CO2: 24 mmol/L (ref 22–32)
Calcium: 9 mg/dL (ref 8.9–10.3)
Creatinine, Ser: 0.86 mg/dL (ref 0.44–1.00)
GFR calc Af Amer: >60 mL/min (ref >60)
GFR calc non Af Amer: >60 mL/min (ref >60)
GLUCOSE: 109 mg/dL — AB (ref 65–99)
POTASSIUM: 3.7 mmol/L (ref 3.5–5.1)
Sodium: 141 mmol/L (ref 135–145)
Total Bilirubin: 0.5 mg/dL (ref 0.3–1.2)
Total Protein: 6.9 g/dL (ref 6.5–8.1)

## 2016-07-23 LAB — BLOOD GAS, ARTERIAL
Acid-Base Excess: 0.3 mmol/L (ref 0.0–2.0)
Bicarbonate: 25.4 mmol/L (ref 20.0–28.0)
FIO2: 0.21
O2 SAT: 95.4 %
PATIENT TEMPERATURE: 37
PO2 ART: 79 mmHg — AB (ref 83.0–108.0)
pCO2 arterial: 42 mmHg (ref 32.0–48.0)
pH, Arterial: 7.39 (ref 7.350–7.450)

## 2016-07-23 LAB — URINE DRUG SCREEN, QUALITATIVE (ARMC ONLY)
Amphetamines, Ur Screen: NOT DETECTED
BARBITURATES, UR SCREEN: NOT DETECTED
Benzodiazepine, Ur Scrn: POSITIVE — AB
CANNABINOID 50 NG, UR ~~LOC~~: POSITIVE — AB
Cocaine Metabolite,Ur ~~LOC~~: NOT DETECTED
MDMA (Ecstasy)Ur Screen: NOT DETECTED
Methadone Scn, Ur: NOT DETECTED
Opiate, Ur Screen: NOT DETECTED
Phencyclidine (PCP) Ur S: NOT DETECTED
TRICYCLIC, UR SCREEN: NOT DETECTED

## 2016-07-23 LAB — CBC
HCT: 39.2 % (ref 35.0–47.0)
Hemoglobin: 13.7 g/dL (ref 12.0–16.0)
MCH: 32.2 pg (ref 26.0–34.0)
MCHC: 35 g/dL (ref 32.0–36.0)
MCV: 92.2 fL (ref 80.0–100.0)
PLATELETS: 315 10*3/uL (ref 150–440)
RBC: 4.25 MIL/uL (ref 3.80–5.20)
RDW: 13.6 % (ref 11.5–14.5)
WBC: 10.1 10*3/uL (ref 3.6–11.0)

## 2016-07-23 LAB — URINALYSIS, COMPLETE (UACMP) WITH MICROSCOPIC
BILIRUBIN URINE: NEGATIVE
Glucose, UA: NEGATIVE mg/dL
HGB URINE DIPSTICK: NEGATIVE
Ketones, ur: NEGATIVE mg/dL
Leukocytes, UA: NEGATIVE
NITRITE: NEGATIVE
PH: 6 (ref 5.0–8.0)
Protein, ur: NEGATIVE mg/dL
SPECIFIC GRAVITY, URINE: 1.008 (ref 1.005–1.030)

## 2016-07-23 LAB — ACETAMINOPHEN LEVEL: ACETAMINOPHEN (TYLENOL), SERUM: 14 ug/mL (ref 10–30)

## 2016-07-23 LAB — SALICYLATE LEVEL: Salicylate Lvl: <7 mg/dL (ref 2.8–30.0)

## 2016-07-23 LAB — ETHANOL: Alcohol, Ethyl (B): <5 mg/dL (ref <5)

## 2016-07-23 MED ORDER — DICYCLOMINE HCL 10 MG PO CAPS
10.0000 mg | ORAL_CAPSULE | Freq: Three times a day (TID) | ORAL | Status: DC
  Administered 2016-07-24 – 2016-07-26 (×7): 10 mg via ORAL
  Filled 2016-07-23 (×9): qty 1

## 2016-07-23 MED ORDER — QUETIAPINE FUMARATE 25 MG PO TABS
50.0000 mg | ORAL_TABLET | Freq: Every day | ORAL | Status: DC
  Administered 2016-07-23: 50 mg via ORAL
  Filled 2016-07-23: qty 2

## 2016-07-23 MED ORDER — QUETIAPINE FUMARATE 25 MG PO TABS: 25.0000 mg | ORAL_TABLET | Freq: Three times a day (TID) | ORAL | Status: DC | PRN

## 2016-07-23 MED ORDER — ALUM & MAG HYDROXIDE-SIMETH 200-200-20 MG/5ML PO SUSP
30.0000 mL | ORAL | Status: DC | PRN
  Administered 2016-07-24 (×2): 30 mL via ORAL
  Filled 2016-07-23 (×2): qty 30

## 2016-07-23 MED ORDER — SODIUM CHLORIDE 0.9 % IV BOLUS (SEPSIS)
1000.0000 mL | Freq: Once | INTRAVENOUS | Status: AC
  Administered 2016-07-23: 1000 mL via INTRAVENOUS

## 2016-07-23 MED ORDER — MAGNESIUM HYDROXIDE 400 MG/5ML PO SUSP: 30.0000 mL | Freq: Every day | ORAL | Status: DC | PRN

## 2016-07-23 MED ORDER — ACETAMINOPHEN 325 MG PO TABS
650.0000 mg | ORAL_TABLET | Freq: Four times a day (QID) | ORAL | Status: DC | PRN
  Administered 2016-07-23 – 2016-07-25 (×6): 650 mg via ORAL
  Filled 2016-07-23 (×6): qty 2

## 2016-07-23 MED ORDER — FLUTICASONE PROPIONATE 50 MCG/ACT NA SUSP
2.0000 | Freq: Every day | NASAL | Status: DC
  Administered 2016-07-23 – 2016-07-25 (×3): 2 via NASAL
  Filled 2016-07-23 (×2): qty 16

## 2016-07-23 MED ORDER — CHARCOAL ACTIVATED PO LIQD
50.0000 g | Freq: Once | ORAL | Status: AC
  Administered 2016-07-23: 50 g via ORAL
  Filled 2016-07-23: qty 240

## 2016-07-23 MED ORDER — ALBUTEROL SULFATE HFA 108 (90 BASE) MCG/ACT IN AERS
2.0000 | INHALATION_SPRAY | Freq: Four times a day (QID) | RESPIRATORY_TRACT | Status: DC | PRN
  Filled 2016-07-23: qty 6.7

## 2016-07-23 MED ORDER — TRAZODONE HCL 100 MG PO TABS
100.0000 mg | ORAL_TABLET | Freq: Every evening | ORAL | Status: DC | PRN
  Administered 2016-07-24: 100 mg via ORAL
  Filled 2016-07-23 (×2): qty 1

## 2016-07-23 MED ORDER — LORATADINE 10 MG PO TABS
10.0000 mg | ORAL_TABLET | Freq: Every day | ORAL | Status: DC
  Administered 2016-07-23 – 2016-07-26 (×4): 10 mg via ORAL
  Filled 2016-07-23 (×4): qty 1

## 2016-07-23 MED ORDER — PNEUMOCOCCAL VAC POLYVALENT 25 MCG/0.5ML IJ INJ
0.5000 mL | INJECTION | INTRAMUSCULAR | Status: AC
  Administered 2016-07-24: 0.5 mL via INTRAMUSCULAR
  Filled 2016-07-23: qty 0.5

## 2016-07-23 MED ORDER — ACETAMINOPHEN 325 MG PO TABS
650.0000 mg | ORAL_TABLET | Freq: Once | ORAL | Status: AC
  Administered 2016-07-23: 650 mg via ORAL
  Filled 2016-07-23: qty 2

## 2016-07-23 NOTE — BH Assessment (Signed)
Patient's daughter Tonna Corner(Lily [phone]) called asking for an update. Writer informed her, he could not give her any information. Daughter did not have her pass code. Writer, wrote her phone number down and forwarded to the patient. Patient stated it would have been okay to give her an update.

## 2016-07-23 NOTE — ED Provider Notes (Signed)
[facility] Emergency Department Provider Note   ____________________________________________   First MD Initiated Contact with Patient 07/23/16 [phone]     (approximate)  I have reviewed the triage vital signs and the nursing notes.   HISTORY  Chief Complaint Drug Overdose  History limited by patient's drowsiness  HPI Shawna [name] is a 51 y.o. female brought to the ED from home via EMS with a chief complaint of intentional overdose. Patient admits to intentional ingestion approximately 3 AM of 8 Ambien, 45 Klonopin and 45 Atarax. Patient was recently seen in the emergency department multiple times in the past week for abdominal pain and fall. States she hurts so badly she wanted to kill her self. Denies HI/AH/VH. Complains of back pain, hip pain. Denies injury or trauma. Rest of history is limited secondary to patient's drowsiness.   Past Medical History:  Diagnosis Date  . Asthma   . Bulging lumbar disc   . Bulging lumbar disc   . GERD (gastroesophageal reflux disease)   . Insomnia   . Kidney stone   . Pneumonia DEC 2016  . Sciatic leg pain    Right side  . Sciatica of right side   . Weakness    Bil knees, and ankles    Patient Active Problem List   Diagnosis Date Noted  . Bacterial pneumonia 01/23/2015  . Leukocytosis 01/23/2015  . Sepsis (HCC) 01/23/2015  . Hypoxemia 01/23/2015  . Opioid use disorder, moderate, dependence (HCC) 12/31/2014  . Anxiety disorder, unspecified 12/31/2014    Past Surgical History:  Procedure Laterality Date  . ABDOMINAL HYSTERECTOMY     Partial  . ESOPHAGOGASTRODUODENOSCOPY (EGD) WITH PROPOFOL N/A 11/27/2015   Procedure: ESOPHAGOGASTRODUODENOSCOPY (EGD) WITH PROPOFOL;  Surgeon: Christena Deem, MD;  Location: [facility] ENDOSCOPY;  Service: Endoscopy;  Laterality: N/A;  . SHOULDER SURGERY Right   . TONSILLECTOMY    . TUBAL LIGATION      Prior to Admission medications   Medication Sig Start Date End Date  Taking? Authorizing Provider  acetaminophen (TYLENOL) 500 MG tablet Take 500 mg by mouth every 6 (six) hours as needed.    Historical Provider, MD  albuterol (PROVENTIL HFA;VENTOLIN HFA) 108 (90 Base) MCG/ACT inhaler Inhale 2 puffs into the lungs every 6 (six) hours as needed for wheezing or shortness of breath. 01/16/16   Jennye Moccasin, MD  aluminum-magnesium hydroxide 200-200 MG/5ML suspension Take 10 mLs by mouth every 6 (six) hours as needed for indigestion. Patient not taking: Reported on 07/07/2016 12/14/15   Nita Sickle, MD  clonazePAM (KLONOPIN) 1 MG tablet Take 1 mg by mouth 2 (two) times daily. Reported on 10/28/2015    Historical Provider, MD  dexlansoprazole (DEXILANT) 60 MG capsule Take 1 capsule by mouth daily.    Historical Provider, MD  docusate sodium (COLACE) 100 MG capsule Take 1 tablet once or twice daily as needed for constipation while taking narcotic pain medicine Patient not taking: Reported on 07/07/2016 11/23/15   Loleta Rose, MD  famotidine (PEPCID) 40 MG tablet Take 1 tablet (40 mg total) by mouth every evening. 07/07/16 07/07/17  Merrily Brittle, MD  fluticasone (FLONASE) 50 MCG/ACT nasal spray Place 1 spray into both nostrils daily.    Historical Provider, MD  hydrOXYzine (ATARAX/VISTARIL) 50 MG tablet Take 50 mg by mouth every 6 (six) hours as needed.    Historical Provider, MD  loperamide (LOPERAMIDE A-D) 2 MG tablet Take 1 tablet (2 mg total) by mouth 4 (four) times daily as needed  for diarrhea or loose stools. 07/07/16   Merrily Brittle, MD  ondansetron (ZOFRAN ODT) 4 MG disintegrating tablet Take 1 tablet (4 mg total) by mouth every 8 (eight) hours as needed for nausea or vomiting. 04/09/15   Darci Current, MD  oxyCODONE-acetaminophen (ROXICET) 5-325 MG tablet Take 1-2 tablets by mouth every 4 (four) hours as needed for severe pain. Patient not taking: Reported on 11/27/2015 11/23/15   Loleta Rose, MD  predniSONE (DELTASONE) 20 MG tablet Take 2 tablets (40 mg total)  by mouth daily. Patient not taking: Reported on 07/07/2016 01/16/16   Jennye Moccasin, MD  sucralfate (CARAFATE) 1 g tablet Take 1 g by mouth 4 (four) times daily -  with meals and at bedtime.    Historical Provider, MD  traMADol (ULTRAM) 50 MG tablet Take 1 tablet (50 mg total) by mouth every 6 (six) hours as needed. 07/12/16   Minna Antis, MD  zolpidem (AMBIEN) 10 MG tablet Take 10 mg by mouth at bedtime.    Historical Provider, MD    Allergies Ciprofloxacin hcl; Fentanyl; Haldol [haloperidol]; Morphine and related; Propofol; and Vicodin [hydrocodone-acetaminophen]  Family History  Problem Relation Age of Onset  . Fibromyalgia Mother     Social History Social History  Substance Use Topics  . Smoking status: Current Every Day Smoker    Packs/day: 0.50    Years: 40.00    Types: Cigarettes  . Smokeless tobacco: Current User  . Alcohol use No    Review of Systems  Constitutional: No fever/chills. Eyes: No visual changes. ENT: No sore throat. Cardiovascular: Denies chest pain. Respiratory: Denies shortness of breath. Gastrointestinal: No abdominal pain.  No nausea, no vomiting.  No diarrhea.  No constipation. Genitourinary: Negative for dysuria. Musculoskeletal: Negative for back pain. Skin: Negative for rash. Neurological: Negative for headaches, focal weakness or numbness. Psychiatric:Positive for suicidal ideation.  10-point ROS otherwise negative.  ____________________________________________   PHYSICAL EXAM:  VITAL SIGNS: ED Triage Vitals [07/23/16 0431]  Enc Vitals Group     BP      Pulse      Resp      Temp      Temp src      SpO2      Weight 140 lb (63.5 kg)     Height 5\' 6"  (1.676 m)     Head Circumference      Peak Flow      Pain Score      Pain Loc      Pain Edu?      Excl. in GC?     Constitutional: Drowsy. Disheveled appearing and tearful. Eyes: Conjunctivae are normal. PERRL. EOMI. Head: Atraumatic. Nose: No  congestion/rhinnorhea. Mouth/Throat: Mucous membranes are moist.  Oropharynx non-erythematous. Neck: No stridor.  No cervical spine tenderness to palpation. Cardiovascular: Normal rate, regular rhythm. Grossly normal heart sounds.  Good peripheral circulation. Respiratory: Normal respiratory effort.  No retractions. Lungs CTAB. Gastrointestinal: Soft and nontender. No distention. No abdominal bruits. No CVA tenderness. Musculoskeletal: No lower extremity tenderness nor edema.  No joint effusions. Neurologic:  Drowsy but arousable to voice. Slurred speech and language. No gross focal neurologic deficits are appreciated. MAEx4.  Skin:  Skin is warm, dry and intact. No rash noted. Psychiatric: Mood and affect are tearful. Speech and behavior are normal.  ____________________________________________   LABS (all labs ordered are listed, but only abnormal results are displayed)  Labs Reviewed  COMPREHENSIVE METABOLIC PANEL - Abnormal; Notable for the following:  Result Value   Glucose, Bld 109 (*)    ALT 12 (*)    All other components within normal limits  URINE DRUG SCREEN, QUALITATIVE ([facility] ONLY) - Abnormal; Notable for the following:    Cannabinoid 50 Ng, Ur [place] POSITIVE (*)    Benzodiazepine, Ur Scrn POSITIVE (*)    All other components within normal limits  URINALYSIS, COMPLETE (UACMP) WITH MICROSCOPIC - Abnormal; Notable for the following:    Color, Urine YELLOW (*)    APPearance CLEAR (*)    Bacteria, UA RARE (*)    Squamous Epithelial / LPF 0-5 (*)    All other components within normal limits  BLOOD GAS, ARTERIAL - Abnormal; Notable for the following:    pO2, Arterial 79 (*)    All other components within normal limits  ETHANOL  SALICYLATE LEVEL  ACETAMINOPHEN LEVEL  CBC  CBG MONITORING, ED  POC URINE PREG, ED   ____________________________________________  EKG  ED ECG REPORT I, Tyr Franca J, the attending physician, personally viewed and interpreted this ECG.    Date: 07/23/2016  EKG Time: 0432  Rate: 87  Rhythm: normal EKG, normal sinus rhythm  Axis: Normal  Intervals:none  ST&T Change: Nonspecific  ____________________________________________  RADIOLOGY  Portal chest x-ray interpreted per Dr. Manus GunningEhinger: No acute abnormality. ____________________________________________   PROCEDURES  Procedure(s) performed: None  Procedures  Critical Care performed: No  ____________________________________________   INITIAL IMPRESSION / ASSESSMENT AND PLAN / ED COURSE  Pertinent labs & imaging results that were available during my care of the patient were reviewed by me and considered in my medical decision making (see chart for details).  51 year old female who presents with intentional overdose. Poison control called by primary nurse who recommends supportive care and 6 hour observation. Place patient under involuntary commitment, consult TTS and psychiatry. Charcoal order for detoxification. IV fluid resuscitation initiated.  Clinical Course as of Jul 24 710  Thu Jul 23, 2016  0709 Patient awake, resting in no acute distress. I was informed there is no in-house psychiatry so [facility]. She currently remains under IVC in the emergency department. She may be medically cleared in another 3.5 hours. Ongoing care transferred to Dr. Don PerkingVeronese.  [JS]    Clinical Course User Index [JS] Irean HongJade J Harshith Pursell, MD     ____________________________________________   FINAL CLINICAL IMPRESSION(S) / ED DIAGNOSES  Final diagnoses:  Suicidal ideation  Depression, unspecified depression type  Intentional drug overdose, initial encounter [facility](HCC)      NEW MEDICATIONS STARTED DURING THIS VISIT:  New Prescriptions   No medications on file     Note:  This document was prepared using Dragon voice recognition software and may include unintentional dictation errors.    Irean HongJade J Mackenzie Lia, MD 07/23/16 [phone]

## 2016-07-23 NOTE — ED Provider Notes (Signed)
-----------------------------------------   12:46 PM on 07/23/2016 -----------------------------------------   Blood pressure 97/62, pulse 84, resp. rate 14, height 5\' 6"  (1.676 m), weight 140 lb (63.5 kg), SpO2 100 %.  Patient medically cleared at this time. She remains awake and alert with normal labs and vitals. [facility]   Nita Sicklearolina Carnella Fryman, MD 07/23/16 1252

## 2016-07-23 NOTE — ED Notes (Signed)
Pt given lunch tray.

## 2016-07-23 NOTE — Tx Team (Signed)
Initial Treatment Plan 07/23/2016 6:03 PM Shawna FillersWendy A Kings EPP:[id number]    PATIENT STRESSORS: Financial difficulties Marital or family conflict   PATIENT STRENGTHS: Average or above average intelligence Motivation for treatment/growth   PATIENT IDENTIFIED PROBLEMS:   Financial  Difficulties with family  Sadness               DISCHARGE CRITERIA:  Ability to meet basic life and health needs Improved stabilization in mood, thinking, and/or behavior Medical problems require only outpatient monitoring Verbal commitment to aftercare and medication compliance  PRELIMINARY DISCHARGE PLAN: Return to previous living arrangement  PATIENT/FAMILY INVOLVEMENT: This treatment plan has been presented to and reviewed with the patient, Shawna FillersWendy A Heckendorn, and/or family member.  The patient and family have been given the opportunity to ask questions and make suggestions.  Johann CapersMegan  Aison Malveaux, RN 07/23/2016, 6:03 PM

## 2016-07-23 NOTE — ED Triage Notes (Signed)
Pt arrived from home by EMS post overdose on approximately, 8 Ambien, 45 Klonopin, and 45 Atarax, taken at approximately 0300 this AM. Pt is A&O x4, pale in color, VS within normal limits, pt anxious with sporadic movements. MD at bedside.

## 2016-07-23 NOTE — ED Notes (Signed)
Pt given ice water.

## 2016-07-23 NOTE — ED Notes (Signed)
Poison Controled called [phone]) Recommendations: Fluids, Charcoal, 6 hour observation

## 2016-07-23 NOTE — ED Notes (Signed)
When pt was given lunch tray pt wanted to talk with tech. Pt ask tech "how do you quit feeling like this?" Tech ask "feeling like how"? Pt stated wanted to hurt herself. Pt states she has two great kids and doesn't want to leave them but ever since she was small she has had a difficult life. Pt states her grandmother died 20 yrs ago and she is ready to go be with her. Tech listened and informed pt that she will be speaking with psychiatrist and to be open and honest with him or he will not be able to help her if he doesn't know the full extent of what she feels.  Pt showed understanding and stated she would and said that her son wants her committed to get help so she doesn't keep having these feelings. Pt ate lunch and is watching TV at this time. Pt ambulated to bathroom.

## 2016-07-23 NOTE — ED Notes (Signed)
Pt given breakfast tray

## 2016-07-23 NOTE — BH Assessment (Signed)
Tele Assessment Note   Shawna [name] is an 51 y.o. female that reports taking an intentional overdose on approximately, 8 Ambien, 45 Klonopin, and 45 Atarax, taken at approximately 0300 this AM.  Patient is a poor historian.  Patient was anxious during the assessment.   Patient reports increased depression associated due to increased pain in her back and legs.  Patient reports that she lives alone and does not have any family.  Patient Patient denies HI/ Psychosis/Substance Abuse.   Patient reports a past history of inpatient psychiatric hospitalizations but she is not able to remember the name of the facility or when she was admitted to the facility.    Diagnosis: Major Depressive Disorder   Past Medical History:  Past Medical History:  Diagnosis Date  . Asthma   . Bulging lumbar disc   . Bulging lumbar disc   . GERD (gastroesophageal reflux disease)   . Insomnia   . Kidney stone   . Pneumonia DEC 2016  . Sciatic leg pain    Right side  . Sciatica of right side   . Weakness    Bil knees, and ankles    Past Surgical History:  Procedure Laterality Date  . ABDOMINAL HYSTERECTOMY     Partial  . ESOPHAGOGASTRODUODENOSCOPY (EGD) WITH PROPOFOL N/A 11/27/2015   Procedure: ESOPHAGOGASTRODUODENOSCOPY (EGD) WITH PROPOFOL;  Surgeon: Christena Deem, MD;  Location: [facility] ENDOSCOPY;  Service: Endoscopy;  Laterality: N/A;  . SHOULDER SURGERY Right   . TONSILLECTOMY    . TUBAL LIGATION      Family History:  Family History  Problem Relation Age of Onset  . Fibromyalgia Mother     Social History:  reports that she has been smoking Cigarettes.  She has a 20.00 pack-year smoking history. She uses smokeless tobacco. She reports that she does not drink alcohol or use drugs.  Additional Social History:  Alcohol / Drug Use History of alcohol / drug use?: No history of alcohol / drug abuse  CIWA:   COWS:    PATIENT STRENGTHS: (choose at least two) Average or above average  intelligence Capable of independent living Communication skills  Allergies:  Allergies  Allergen Reactions  . Ciprofloxacin Hcl Nausea Only  . Fentanyl Nausea And Vomiting and Other (See Comments)    Patient states this makes her have severe flu symptoms.  . Haldol [Haloperidol]     Made pt "feel really weird".  . Morphine And Related Nausea And Vomiting    Patient states this medication makes her have sever flu symptoms.  . Propofol   . Vicodin [Hydrocodone-Acetaminophen] Nausea And Vomiting    Home Medications:  (Not in a hospital admission)  OB/GYN Status:  No LMP recorded. Patient has had a hysterectomy.  General Assessment Data Location of Assessment: [facility] ED TTS Assessment: In system Is this a Tele or Face-to-Face Assessment?: Face-to-Face Is this an Initial Assessment or a Re-assessment for this encounter?: Initial Assessment Marital status: Single Maiden name: NA Is patient pregnant?: No Pregnancy Status: No Living Arrangements: Alone Can pt return to current living arrangement?: Yes Admission Status: Voluntary Is patient capable of signing voluntary admission?: Yes Referral Source: Self/Family/Friend Insurance type: Medicaid  Medical Screening Exam [facility] Walk-in ONLY) Medical Exam completed:  (NA)  Crisis Care Plan Living Arrangements: Alone Legal Guardian:  (NA) Name of Psychiatrist: None Reported Name of Therapist: None Reported  Education Status Is patient currently in school?: No Current Grade: NA Highest grade of school patient has completed: NA Name of  school: NA Contact person: NA  Risk to self with the past 6 months Suicidal Ideation: Yes-Currently Present Has patient been a risk to self within the past 6 months prior to admission? : Yes Suicidal Intent: Yes-Currently Present Has patient had any suicidal intent within the past 6 months prior to admission? : Yes Is patient at risk for suicide?: Yes Suicidal Plan?: Yes-Currently Present Has  patient had any suicidal plan within the past 6 months prior to admission? : Yes Specify Current Suicidal Plan: Overdose on [place] Access to Means: No What has been your use of drugs/alcohol within the last 12 months?: None Reported Previous Attempts/Gestures: No How many times?: 0 Other Self Harm Risks: None Reported Triggers for Past Attempts:  (na) Intentional Self Injurious Behavior: None Family Suicide History: No Recent stressful life event(s): Other (Comment) (Pack, hip and leg pain) Persecutory voices/beliefs?: No Depression: Yes Depression Symptoms: Despondent, Insomnia, Tearfulness, Isolating, Fatigue, Guilt, Feeling worthless/self pity, Loss of interest in usual pleasures, Feeling angry/irritable Substance abuse history and/or treatment for substance abuse?: No Suicide prevention information given to non-admitted patients: Yes  Risk to Others within the past 6 months Homicidal Ideation: No Does patient have any lifetime risk of violence toward others beyond the six months prior to admission? : No Thoughts of Harm to Others: No Current Homicidal Intent: No Current Homicidal Plan: No Access to Homicidal Means: No Identified Victim: NA History of harm to others?: No Assessment of Violence: In distant past Violent Behavior Description: NA Does patient have access to weapons?: No Criminal Charges Pending?: No Does patient have a court date: No Is patient on probation?: No  Psychosis Hallucinations: None noted Delusions: None noted  Mental Status Report Appearance/Hygiene: In hospital gown Eye Contact: Poor Motor Activity: Freedom of movement Speech: Logical/coherent Level of Consciousness: Alert Mood: Depressed Affect: Anxious, Blunted, Depressed Anxiety Level: Minimal Thought Processes: Coherent, Relevant Judgement: Impaired Orientation: Person, Place, Time, Situation Obsessive Compulsive Thoughts/Behaviors: None  Cognitive Functioning Concentration:  Normal Memory: Recent Intact, Remote Intact IQ: Average Insight: Fair Impulse Control: Fair Appetite: Fair Weight Loss: 0 Weight Gain: 0 Sleep: Decreased Total Hours of Sleep: 3 Vegetative Symptoms: Decreased grooming, Not bathing, Staying in bed  ADLScreening [facility] Assessment Services) Patient's cognitive ability adequate to safely complete daily activities?: Yes Patient able to express need for assistance with ADLs?: Yes Independently performs ADLs?: Yes (appropriate for developmental age)  Prior Inpatient Therapy Prior Inpatient Therapy: Yes Prior Therapy Dates: Unable to remember Prior Therapy Facilty/Provider(s): Unable to remember Reason for Treatment: Depression  Prior Outpatient Therapy Prior Outpatient Therapy: Yes Prior Therapy Dates: Ongoing Prior Therapy Facilty/Provider(s): [facility]  (Dr. Janeece Riggers) Reason for Treatment: Medication Mgt Does patient have an ACCT team?: No Does patient have Intensive In-House Services?  : No Does patient have [facility] services? : No Does patient have P4CC services?: No  ADL Screening (condition at time of admission) Patient's cognitive ability adequate to safely complete daily activities?: Yes Is the patient deaf or have difficulty hearing?: No Does the patient have difficulty seeing, even when wearing glasses/contacts?: No Does the patient have difficulty concentrating, remembering, or making decisions?: No Patient able to express need for assistance with ADLs?: Yes Does the patient have difficulty dressing or bathing?: No Independently performs ADLs?: Yes (appropriate for developmental age) Does the patient have difficulty walking or climbing stairs?: No Weakness of Legs: None Weakness of Arms/Hands: None  Home Assistive Devices/Equipment Home Assistive Devices/Equipment: None    Abuse/Neglect Assessment (Assessment to be complete while patient  is alone) Physical Abuse: Denies Verbal Abuse: Denies Sexual Abuse:  Denies Exploitation of patient/patient's resources: Denies Self-Neglect: Denies Values / Beliefs Cultural Requests During Hospitalization: None Spiritual Requests During Hospitalization: None Consults Spiritual Care Consult Needed: No Social Work Consult Needed: No Merchant navy officerAdvance Directives (For Healthcare) Does Patient Have a Medical Advance Directive?: No Would patient like information on creating a medical advance directive?: No - Patient declined    Additional Information 1:1 In Past 12 Months?: No CIRT Risk: No Elopement Risk: No Does patient have medical clearance?: Yes     Disposition: Pending psych disposition. Disposition Initial Assessment Completed for this Encounter: Yes  Phillip HealStevenson, Ira Busbin LaVerne 07/23/2016 5:59 AM

## 2016-07-23 NOTE — ED Notes (Signed)
Pt requesting tylenol for leg pain.

## 2016-07-23 NOTE — ED Notes (Signed)
Pt finished with phone call from daughter. Phone returned to base. Pt stated daughter is coming to visit. I informed RN at first pt did not want daughter allowed to room and then pt changed her mind and wants daughter to visit.
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
## 2016-07-23 NOTE — ED Notes (Signed)
Pt moved to room 20 and given two blankets. Pt is lying down with eyes closed.

## 2016-07-23 NOTE — BH Assessment (Signed)
[facility], patient meets inpatient criteria. Writer [facility] Dr. Jennet MaduroPucilowska, and she will put admission orders in for the patient. Once she is medically cleared.    [facility] by Dr. Jennet MaduroPucilowska.  Attending Physician will be Dr. Jennet MaduroPucilowska.   Patient has been [facility] to room 310, [facility] ElginPhyllis.   Intake Paper Work has been signed and placed on patient chart.  ER staff is aware of the admission Misty Stanley(Lisa, ER Sect.; Dr. Don PerkingVeronese, ER MD; Erin HearingSamatha, Patient's Nurse & Abby, Patient Access).
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
## 2016-07-24 ENCOUNTER — Encounter: Payer: Self-pay | Admitting: Psychiatry

## 2016-07-24 DIAGNOSIS — T1491XA Suicide attempt, initial encounter: Secondary | ICD-10-CM

## 2016-07-24 DIAGNOSIS — F419 Anxiety disorder, unspecified: Secondary | ICD-10-CM

## 2016-07-24 DIAGNOSIS — T43592A Poisoning by other antipsychotics and neuroleptics, intentional self-harm, initial encounter: Secondary | ICD-10-CM

## 2016-07-24 DIAGNOSIS — G4700 Insomnia, unspecified: Secondary | ICD-10-CM

## 2016-07-24 DIAGNOSIS — T426X2A Poisoning by other antiepileptic and sedative-hypnotic drugs, intentional self-harm, initial encounter: Secondary | ICD-10-CM

## 2016-07-24 DIAGNOSIS — Z79899 Other long term (current) drug therapy: Secondary | ICD-10-CM

## 2016-07-24 DIAGNOSIS — T424X2A Poisoning by benzodiazepines, intentional self-harm, initial encounter: Principal | ICD-10-CM

## 2016-07-24 DIAGNOSIS — Z79891 Long term (current) use of opiate analgesic: Secondary | ICD-10-CM

## 2016-07-24 MED ORDER — NICOTINE 21 MG/24HR TD PT24
21.0000 mg | MEDICATED_PATCH | Freq: Every day | TRANSDERMAL | Status: DC
  Administered 2016-07-24 – 2016-07-26 (×3): 21 mg via TRANSDERMAL
  Filled 2016-07-24 (×2): qty 1

## 2016-07-24 MED ORDER — PANTOPRAZOLE SODIUM 40 MG PO TBEC
40.0000 mg | DELAYED_RELEASE_TABLET | Freq: Every day | ORAL | Status: DC
  Administered 2016-07-24 – 2016-07-26 (×3): 40 mg via ORAL
  Filled 2016-07-24 (×3): qty 1

## 2016-07-24 MED ORDER — DULOXETINE HCL 20 MG PO CPEP
20.0000 mg | ORAL_CAPSULE | Freq: Every day | ORAL | Status: DC
  Administered 2016-07-24 – 2016-07-25 (×2): 20 mg via ORAL
  Filled 2016-07-24 (×2): qty 1

## 2016-07-24 MED ORDER — LIDOCAINE 5 % EX OINT
TOPICAL_OINTMENT | Freq: Every day | CUTANEOUS | Status: DC | PRN
  Administered 2016-07-24: 15:00:00 via TOPICAL
  Filled 2016-07-24: qty 35.44

## 2016-07-24 MED ORDER — QUETIAPINE FUMARATE 100 MG PO TABS
100.0000 mg | ORAL_TABLET | Freq: Every day | ORAL | Status: DC
  Administered 2016-07-24 – 2016-07-25 (×2): 100 mg via ORAL
  Filled 2016-07-24 (×2): qty 1

## 2016-07-24 MED ORDER — ENSURE ENLIVE PO LIQD
237.0000 mL | Freq: Two times a day (BID) | ORAL | Status: DC
Start: 2016-07-24 — End: 2016-07-26
  Administered 2016-07-24 – 2016-07-25 (×3): 237 mL via ORAL

## 2016-07-24 NOTE — Progress Notes (Signed)
Received AAOx4, clean, cooperative with care. Able to make needs known. Overall depressed affect, appropriate interactions, visible on unit. Denies SI.HI.AVH. Complaints of chronic leg pains and GI upset, see eMAR for interventions. No other complaints at this time. Will continue to monitor and provide interventions as appropriate.
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
## 2016-07-24 NOTE — Plan of Care (Signed)
Problem: Coping: Goal: Ability to verbalize feelings will improve Outcome: Progressing Patient is able to verbalize feelings and needs, keeps journal for thoughts.

## 2016-07-24 NOTE — [facility] Group Notes (Signed)
[facility] LCSW Group Therapy Note  Date/Time: 07/24/16, 0930  Type of Therapy and Topic:  Group Therapy:  Feelings around Relapse and Recovery  Participation Level:  Active   Mood: pleasant  Description of Group:    Patients in this group will discuss emotions they experience before and after a relapse. They will process how experiencing these feelings, or avoidance of experiencing them, relates to having a relapse. Facilitator will guide patients to explore emotions they have related to recovery. Patients will be encouraged to process which emotions are more powerful. They will be guided to discuss the emotional reaction significant others in their lives may have to patients' relapse or recovery. Patients will be assisted in exploring ways to respond to the emotions of others without this contributing to a relapse.  Therapeutic Goals: 1. Patient will identify two or more emotions that lead to relapse for them:  2. Patient will identify two emotions that result when they relapse:  3. Patient will identify two emotions related to recovery:  4. Patient will demonstrate ability to communicate their needs through discussion and/or role plays.   Summary of Patient Progress: Pt described beating her mattress with a rod from her closet as the way that she deals with difficult feelings.  Group processed the pros and cons of coping this way.  Pt shared that her daughter often says things that lead to her having strong negative emotions and also shared that her isolation has made it hard for her and has meant that she does not have a good support system to deal with negative feelings.     Therapeutic Modalities:   Cognitive Behavioral Therapy Solution-Focused Therapy Assertiveness Training Relapse Prevention Therapy  Daleen Squibb, LCSW
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
## 2016-07-24 NOTE — [facility] Suicide Risk Assessment (Signed)
[facility] Admission Suicide Risk Assessment   Nursing information obtained from:  Patient Demographic factors:  Living alone, Unemployed, Low socioeconomic status Current Mental Status:  Self-harm thoughts, Self-harm behaviors, Belief that plan would result in death Loss Factors:  Loss of significant relationship, Decline in physical health Historical Factors:  Impulsivity Risk Reduction Factors:  NA  Total Time spent with patient: 1 hour Principal Problem: Major depressive disorder, recurrent severe without psychotic features (HCC) Diagnosis:   Patient Active Problem List   Diagnosis Date Noted  . Major depressive disorder, recurrent severe without psychotic features (HCC) [F33.2] 07/23/2016  . Overdose of sedative or hypnotic [T42.71XA] 07/23/2016  . COPD exacerbation (HCC) [J44.1] 07/23/2016  . GERD (gastroesophageal reflux disease) [K21.9] 07/23/2016  . Cannabis use disorder, moderate, dependence (HCC) [F12.20] 07/23/2016  . Tobacco use disorder [F17.200] 07/23/2016  . Sedative, hypnotic or anxiolytic use disorder, severe, dependence (HCC) [F13.20] 07/23/2016  . Bacterial pneumonia [J15.9] 01/23/2015  . Leukocytosis [D72.829] 01/23/2015  . Sepsis (HCC) [A41.9] 01/23/2015  . Hypoxemia [R09.02] 01/23/2015  . Opioid use disorder, moderate, dependence (HCC) [F11.20] 12/31/2014  . Anxiety disorder, unspecified [F41.9] 12/31/2014   Subjective Data: Patient is a 51 year old Caucasian woman with depression and chronic pain who presented after an overdose on multiple medications  Continued Clinical Symptoms:  Alcohol Use Disorder Identification Test Final Score (AUDIT): 1 The "Alcohol Use Disorders Identification Test", Guidelines for Use in Primary Care, Second Edition.  World Science writer [facility]). Score between 0-7:  no or low risk or alcohol related problems. Score between 8-15:  moderate risk of alcohol related problems. Score between 16-19:  high risk of alcohol related  problems. Score 20 or above:  warrants further diagnostic evaluation for alcohol dependence and treatment.   CLINICAL FACTORS:   Depression and substance abuse with the cannabis   Musculoskeletal: Strength & Muscle Tone: decreased Gait & Station: normal Patient leans: N/A  Psychiatric Specialty Exam: Physical Exam  ROS  Blood pressure 102/78, pulse 87, temperature 98 F (36.7 C), temperature source Oral, resp. rate 18, height  (1.676 m), weight 134 lb (60.8 kg), SpO2 100 %.Body mass index is 21.63 kg/m.   General Appearance: Casual  Eye Contact:  Fair  Speech:  Clear and Coherent  Volume:  Decreased  Mood:  Anxious and Depressed  Affect:  Constricted  Thought Process:  Coherent  Orientation:  Full (Time, Place, and Person)  Thought Content:  Logical  Suicidal Thoughts:  No  Homicidal Thoughts:  No  Memory:  Immediate;   Fair Recent;   Fair Remote;   Fair  Judgement:  Poor  Insight:  Shallow  Psychomotor Activity:  Decreased  Concentration:  Concentration: Fair and Attention Span: Fair  Recall:  [employer] of Knowledge:  Fair  Language:  Fair  Akathisia:  No  Handed:  Right  AIMS (if indicated):     Assets:  Communication Skills Desire for Improvement Financial Resources/Insurance Housing  ADL's:  Intact  Cognition:  WNL  Sleep:  Number of Hours: 4    Treatment Plan Summary: Daily contact with patient to assess and evaluate symptoms and progress in treatment and Medication management   Patient is a 51 year old Caucasian female with a history off depression and major complaint of pain who presented after an overdose on Ambien, Klonopin and Atarax.  Patient will be introduced to the therapeutic milieu and placed on every 15 checks  Major depressive disorder Start Cymbalta 20 mg daily. Educated about the side effects and benefits. Patient  to start attending groups and participate and develop coping skills to deal with her emotional dysregulation and  improve communication skills.  Anxiety Seroquel 25 mg as needed 3 times daily  Insomnia Continue trazodone at 100 mg at bedtime as needed InCrease Seroquel 100 mg at bedtime  Labs- CBC within normal limits, CMP within normal limits except for slightly elevated glucose, lipid panel shows elevated LDL and triglycerides, HgA1c at 5.6 Urine drug screen positive for cannabis and benzodiazepines. ETOH level less than 5  Disposition: Patient will be discharged to her home after appropriate follow-up appointments once she is stable.   Physician Treatment Plan for Primary Diagnosis: Major depressive disorder, recurrent severe without psychotic features (HCC) Long Term Goal(s): Improvement in symptoms so as ready for discharge  Short Term Goals: Ability to identify changes in lifestyle to reduce recurrence of condition will improve, Ability to verbalize feelings will improve, Ability to disclose and discuss suicidal ideas, Ability to demonstrate self-control will improve, Ability to identify and develop effective coping behaviors will improve, Ability to maintain clinical measurements within normal limits will improve, Compliance with prescribed medications will improve and Ability to identify triggers associated with substance abuse/mental health issues will improve  Physician Treatment Plan for Secondary Diagnosis: Principal Problem:   Major depressive disorder, recurrent severe without psychotic features (HCC) Active Problems:   Overdose of sedative or hypnotic   COPD exacerbation (HCC)   GERD (gastroesophageal reflux disease)   Cannabis use disorder, moderate, dependence (HCC)   Tobacco use disorder   Sedative, hypnotic or anxiolytic use disorder, severe, dependence (HCC)  Long Term Goal(s): Improvement in symptoms so as ready for discharge  Short Term Goals: Ability to identify changes in lifestyle to reduce recurrence of condition will improve, Ability to verbalize feelings will improve,  Ability to disclose and discuss suicidal ideas, Ability to demonstrate self-control will improve, Ability to identify and develop effective coping behaviors will improve, Ability to maintain clinical measurements within normal limits will improve, Compliance with prescribed medications will improve and Ability to identify triggers associated with substance abuse/mental health issues will improve   COGNITIVE FEATURES THAT CONTRIBUTE TO RISK:  Thought constriction (tunnel vision)    SUICIDE RISK:   Moderate:  Frequent suicidal ideation with limited intensity, and duration, some specificity in terms of plans, no associated intent, good self-control, limited dysphoria/symptomatology, some risk factors present, and identifiable protective factors, including available and accessible social support.  PLAN OF CARE: see above  I certify that inpatient services furnished can reasonably be expected to improve the patient's condition.   Patrick North, MD 07/24/2016, 1:23 PM

## 2016-07-24 NOTE — Progress Notes (Signed)
c/o difficulty sleeping. Trazodone 100 mg po given PRN.

## 2016-07-24 NOTE — Tx Team (Signed)
Interdisciplinary Treatment and Diagnostic Plan Update  07/24/2016 Time of Session: 11:00am Shawna [name] MRN: [id number]  Principal Diagnosis: Major depressive disorder, recurrent severe without psychotic features (HCC)  Secondary Diagnoses: Principal Problem:   Major depressive disorder, recurrent severe without psychotic features (HCC) Active Problems:   Overdose of sedative or hypnotic   COPD exacerbation (HCC)   GERD (gastroesophageal reflux disease)   Cannabis use disorder, moderate, dependence (HCC)   Tobacco use disorder   Sedative, hypnotic or anxiolytic use disorder, severe, dependence (HCC)   Current Medications:  Current Facility-Administered Medications  Medication Dose Route Frequency Provider Last Rate Last Dose  . acetaminophen (TYLENOL) tablet 650 mg  650 mg Oral Q6H PRN Shari Prows, MD   650 mg at 07/24/16 0908  . albuterol (PROVENTIL HFA;VENTOLIN HFA) 108 (90 Base) MCG/ACT inhaler 2 puff  2 puff Inhalation Q6H PRN Jolanta B Pucilowska, MD      . alum & mag hydroxide-simeth (MAALOX/MYLANTA) 200-200-20 MG/5ML suspension 30 mL  30 mL Oral Q4H PRN Jolanta B Pucilowska, MD   30 mL at 07/24/16 0744  . dicyclomine (BENTYL) capsule 10 mg  10 mg Oral TID AC & HS Jolanta B Pucilowska, MD      . DULoxetine (CYMBALTA) DR capsule 20 mg  20 mg Oral Daily Himabindu Ravi, MD      . feeding supplement (ENSURE ENLIVE) (ENSURE ENLIVE) liquid 237 mL  237 mL Oral BID BM Jimmy Footman, MD      . fluticasone (FLONASE) 50 MCG/ACT nasal spray 2 spray  2 spray Each Nare Daily Shari Prows, MD   2 spray at 07/24/16 0744  . lidocaine (XYLOCAINE) 5 % ointment   Topical Daily PRN Himabindu Ravi, MD      . loratadine (CLARITIN) tablet 10 mg  10 mg Oral Daily Jolanta B Pucilowska, MD   10 mg at 07/24/16 0744  . magnesium hydroxide (MILK OF MAGNESIA) suspension 30 mL  30 mL Oral Daily PRN Jolanta B Pucilowska, MD      . nicotine (NICODERM CQ - dosed in mg/24 hours) patch  21 mg  21 mg Transdermal Daily Himabindu Ravi, MD   21 mg at 07/24/16 0907  . pantoprazole (PROTONIX) EC tablet 40 mg  40 mg Oral Daily Himabindu Ravi, MD   40 mg at 07/24/16 1110  . QUEtiapine (SEROQUEL) tablet 100 mg  100 mg Oral QHS Himabindu Ravi, MD      . QUEtiapine (SEROQUEL) tablet 25 mg  25 mg Oral TID PRN Shari Prows, MD      . traZODone (DESYREL) tablet 100 mg  100 mg Oral QHS PRN Shari Prows, MD   100 mg at 07/24/16 0015   PTA Medications: Prescriptions Prior to Admission  Medication Sig Dispense Refill Last Dose  . acetaminophen (TYLENOL) 500 MG tablet Take 500 mg by mouth every 6 (six) hours as needed.   prn at prn  . albuterol (PROVENTIL HFA;VENTOLIN HFA) 108 (90 Base) MCG/ACT inhaler Inhale 2 puffs into the lungs every 6 (six) hours as needed for wheezing or shortness of breath. 1 Inhaler 2 07/22/2016 at 2000  . aluminum-magnesium hydroxide 200-200 MG/5ML suspension Take 10 mLs by mouth every 6 (six) hours as needed for indigestion. (Patient not taking: Reported on 07/07/2016) 355 mL 0 Not Taking at Unknown time  . clonazePAM (KLONOPIN) 1 MG tablet Take 1 mg by mouth 3 (three) times daily as needed for anxiety. Reported on 10/28/2015   07/22/2016 at UNKNOWN  .  dexlansoprazole (DEXILANT) 60 MG capsule Take 1 capsule by mouth daily.   07/22/2016 at 0800  . dicyclomine (BENTYL) 10 MG capsule Take 10 mg by mouth 4 (four) times daily -  before meals and at bedtime.   PRN at PRN  . docusate sodium (COLACE) 100 MG capsule Take 1 tablet once or twice daily as needed for constipation while taking narcotic pain medicine (Patient not taking: Reported on 07/07/2016) 30 capsule 0 Not Taking at Unknown time  . famotidine (PEPCID) 40 MG tablet Take 1 tablet (40 mg total) by mouth every evening. (Patient not taking: Reported on 07/23/2016) 30 tablet 0 Not Taking at Unknown time  . fexofenadine (ALLEGRA) 180 MG tablet Take 180 mg by mouth daily.   07/22/2016 at 0800  . fluticasone (FLONASE)  50 MCG/ACT nasal spray Place 1 spray into both nostrils daily.   07/22/2016 at 0800  . hydrOXYzine (ATARAX/VISTARIL) 50 MG tablet Take 50 mg by mouth every 6 (six) hours as needed.   PRN at PRN  . loperamide (LOPERAMIDE A-D) 2 MG tablet Take 1 tablet (2 mg total) by mouth 4 (four) times daily as needed for diarrhea or loose stools. 30 tablet 0 prn at prn  . ondansetron (ZOFRAN ODT) 4 MG disintegrating tablet Take 1 tablet (4 mg total) by mouth every 8 (eight) hours as needed for nausea or vomiting. 20 tablet 0 prn at prn  . oxyCODONE-acetaminophen (ROXICET) 5-325 MG tablet Take 1-2 tablets by mouth every 4 (four) hours as needed for severe pain. (Patient not taking: Reported on 11/27/2015) 20 tablet 0 Not Taking at Unknown time  . predniSONE (DELTASONE) 20 MG tablet Take 2 tablets (40 mg total) by mouth daily. (Patient not taking: Reported on 07/07/2016) 10 tablet 0 Not Taking at Unknown time  . sucralfate (CARAFATE) 1 g tablet Take 1 g by mouth 4 (four) times daily -  with meals and at bedtime.   PRN at PRN  . traMADol (ULTRAM) 50 MG tablet Take 1 tablet (50 mg total) by mouth every 6 (six) hours as needed. (Patient not taking: Reported on 07/23/2016) 10 tablet 0 Completed Course at Unknown time  . zolpidem (AMBIEN) 10 MG tablet Take 10 mg by mouth at bedtime.   07/22/2016 at 2000    Patient Stressors: Financial difficulties Marital or family conflict  Patient Strengths: Average or above average intelligence Motivation for treatment/growth  Treatment Modalities: Medication Management, Group therapy, Case management,  1 to 1 session with clinician, Psychoeducation, Recreational therapy.   Physician Treatment Plan for Primary Diagnosis: Major depressive disorder, recurrent severe without psychotic features (HCC) Long Term Goal(s): Improvement in symptoms so as ready for discharge Improvement in symptoms so as ready for discharge   Short Term Goals: Ability to identify changes in lifestyle to reduce  recurrence of condition will improve Ability to verbalize feelings will improve Ability to disclose and discuss suicidal ideas Ability to demonstrate self-control will improve Ability to identify and develop effective coping behaviors will improve Ability to maintain clinical measurements within normal limits will improve Compliance with prescribed medications will improve Ability to identify triggers associated with substance abuse/mental health issues will improve Ability to identify changes in lifestyle to reduce recurrence of condition will improve Ability to verbalize feelings will improve Ability to disclose and discuss suicidal ideas Ability to demonstrate self-control will improve Ability to identify and develop effective coping behaviors will improve Ability to maintain clinical measurements within normal limits will improve Compliance with prescribed medications will improve Ability  to identify triggers associated with substance abuse/mental health issues will improve  Medication Management: Evaluate patient's response, side effects, and tolerance of medication regimen.  Therapeutic Interventions: 1 to 1 sessions, Unit Group sessions and Medication administration.  Evaluation of Outcomes: Progressing  Physician Treatment Plan for Secondary Diagnosis: Principal Problem:   Major depressive disorder, recurrent severe without psychotic features (HCC) Active Problems:   Overdose of sedative or hypnotic   COPD exacerbation (HCC)   GERD (gastroesophageal reflux disease)   Cannabis use disorder, moderate, dependence (HCC)   Tobacco use disorder   Sedative, hypnotic or anxiolytic use disorder, severe, dependence (HCC)  Long Term Goal(s): Improvement in symptoms so as ready for discharge Improvement in symptoms so as ready for discharge   Short Term Goals: Ability to identify changes in lifestyle to reduce recurrence of condition will improve Ability to verbalize feelings will  improve Ability to disclose and discuss suicidal ideas Ability to demonstrate self-control will improve Ability to identify and develop effective coping behaviors will improve Ability to maintain clinical measurements within normal limits will improve Compliance with prescribed medications will improve Ability to identify triggers associated with substance abuse/mental health issues will improve Ability to identify changes in lifestyle to reduce recurrence of condition will improve Ability to verbalize feelings will improve Ability to disclose and discuss suicidal ideas Ability to demonstrate self-control will improve Ability to identify and develop effective coping behaviors will improve Ability to maintain clinical measurements within normal limits will improve Compliance with prescribed medications will improve Ability to identify triggers associated with substance abuse/mental health issues will improve     Medication Management: Evaluate patient's response, side effects, and tolerance of medication regimen.  Therapeutic Interventions: 1 to 1 sessions, Unit Group sessions and Medication administration.  Evaluation of Outcomes: Progressing   RN Treatment Plan for Primary Diagnosis: Major depressive disorder, recurrent severe without psychotic features (HCC) Long Term Goal(s): Knowledge of disease and therapeutic regimen to maintain health will improve  Short Term Goals: Ability to remain free from injury will improve, Ability to demonstrate self-control, Ability to disclose and discuss suicidal ideas, Ability to identify and develop effective coping behaviors will improve and Compliance with prescribed medications will improve  Medication Management: RN will administer medications as ordered by provider, will assess and evaluate patient's response and provide education to patient for prescribed medication. RN will report any adverse and/or side effects to prescribing  provider.  Therapeutic Interventions: 1 on 1 counseling sessions, Psychoeducation, Medication administration, Evaluate responses to treatment, Monitor vital signs and CBGs as ordered, Perform/monitor CIWA, COWS, AIMS and Fall Risk screenings as ordered, Perform wound care treatments as ordered.  Evaluation of Outcomes: Progressing   LCSW Treatment Plan for Primary Diagnosis: Major depressive disorder, recurrent severe without psychotic features (HCC) Long Term Goal(s): Safe transition to appropriate next level of care at discharge, Engage patient in therapeutic group addressing interpersonal concerns.  Short Term Goals: Engage patient in aftercare planning with referrals and resources, Increase social support, Increase emotional regulation and Increase skills for wellness and recovery  Therapeutic Interventions: Assess for all discharge needs, 1 to 1 time with Social worker, Explore available resources and support systems, Assess for adequacy in community support network, Educate family and significant other(s) on suicide prevention, Complete Psychosocial Assessment, Interpersonal group therapy.  Evaluation of Outcomes: Progressing   Progress in Treatment: Attending groups: Yes. Participating in groups: Yes. Taking medication as prescribed: Yes. Toleration medication: Yes. Family/Significant other contact made: No, will contact:  identified support person Patient  understands diagnosis: Yes. Discussing patient identified problems/goals with staff: Yes. Medical problems stabilized or resolved: Yes. Denies suicidal/homicidal ideation: Yes. Issues/concerns per patient self-inventory: No. Other: n/a  New problem(s) identified: None identified at this time.   New Short Term/Long Term Goal(s): Goals identified by patient during treatment team: "I need to work on being more positive. Patient will attend therapeutic groups to improve self-esteem and develop healthy coping skills.   Discharge  Plan or Barriers: Patient will discharge home and follow-up with outpatient services.   Reason for Continuation of Hospitalization: Depression Medication stabilization  Estimated Length of Stay: 4 to 5 days.   Attendees: Patient: Shawna [name] 07/24/2016 2:05 PM  Physician: Dr. Patrick North, MD 07/24/2016 2:05 PM  Nursing: Leonia Reader, RN 07/24/2016 2:05 PM  RN Care Manager: 07/24/2016 2:05 PM  Social Worker: Fredrich Birks. Garnette Czech MSW, LCSWA 07/24/2016 2:05 PM  Recreational Therapist:  07/24/2016 2:05 PM  Other:  07/24/2016 2:05 PM  Other:  07/24/2016 2:05 PM  Other: 07/24/2016 2:05 PM    Scribe for Treatment Team: Arelia Longest, LCSWA 07/24/2016 2:12 PM

## 2016-07-24 NOTE — Progress Notes (Signed)
NUTRITION ASSESSMENT  Pt identified as at risk on the Malnutrition Screen Tool  INTERVENTION: 1. Educated patient on the importance of nutrition and encouraged intake of food and beverages. 2. Discussed weight goals. 3. Supplements: Ensure Enlive po BID, each supplement provides 350 kcal and 20 grams of protein (patient prefers chocolate) 4. Discussed other meals patient can have at home that will have both adequate calories and protein to prevent further weight loss and also be easy to prepare. Also discussed a drink patient can make at home from milk, milk powder, chocolate syrup to supplement her calorie/protein intake.  NUTRITION DIAGNOSIS: Unintentional weight loss related to sub-optimal intake as evidenced by pt report.   Goal: Pt to meet >/= 90% of their estimated nutrition needs.  Monitor:  PO intake  Assessment:  51 y.o. female with PMHx of insomnia, GERD, sciatica, bulging lumbar disc presents with nausea, MDD after medication overdose.  Patient reports her appetite has been poor for approximately one year now. She reports she is weak and has difficulty preparing food at home and then often times cannot finish her meals due to nausea and reflux. She reports she can tolerate oven-baked french fries with ranch, peas, and stir-fry. Patient reports she does not get a lot of protein intake at home. She used to drink Boost but reports she cannot afford it anymore. Patient is wondering why she is on a heart healthy diet as she has no history of HTN, hyperlipidemia, heart disease. She is asking if it can be liberalized. UBW was around 150 lbs. Per chart patient has lost 16 lbs (10.7% body weight) over 7 months, which is not significant for time frame.  Height: Ht Readings from Last 1 Encounters:  07/23/16  (1.676 m)    Weight: Wt Readings from Last 1 Encounters:  07/23/16 134 lb (60.8 kg)    Weight Hx: Wt Readings from Last 10 Encounters:  07/23/16 134 lb (60.8 kg)   07/23/16 140 lb (63.5 kg)  07/20/16 139 lb (63 kg)  07/12/16 140 lb (63.5 kg)  07/07/16 140 lb (63.5 kg)  05/18/16 143 lb (64.9 kg)  01/16/16 140 lb (63.5 kg)  12/14/15 150 lb (68 kg)  11/27/15 150 lb (68 kg)  11/23/15 150 lb (68 kg)    BMI:  Body mass index is 21.63 kg/m. Pt meets criteria for normal weight based on current BMI.  Estimated Nutritional Needs: Kcal: 25-30 kcal/kg Protein: > 1 gram protein/kg Fluid: 1 ml/kcal  Diet Order: Diet Heart Room service appropriate? Yes; Fluid consistency: Thin Pt is also offered choice of unit snacks mid-morning and mid-afternoon.  Pt is eating as desired.   Lab results and medications reviewed.   Helane Rima, MS, RD, LDN Pager: [phone] After Hours Pager: [phone]

## 2016-07-24 NOTE — [facility] Group Notes (Signed)
[facility] Group Notes:  (Nursing/MHT/Case Management/Adjunct)  Date:  07/24/2016  Time:  12:16 AM  Type of Therapy:  Psychoeducational Skills  Participation Level:  Active  Participation Quality:  Appropriate  Affect:  Appropriate  Cognitive:  Appropriate  Insight:  Appropriate and Good  Engagement in Group:  Engaged  Modes of Intervention:  Discussion, Socialization and Support  Summary of Progress/Problems:  Chancy Milroy 07/24/2016, 12:16 AM

## 2016-07-24 NOTE — Plan of Care (Signed)
Problem: Activity: Goal: Sleeping patterns will improve Outcome: Not Met (add Reason) Slept 4 hours. Med compliant. c/o difficulty sleeping and leg pain. Tylenol 650 mg po PRN @ 2000 and Trazodone 100 mg po gvien PRN @0010. Clinical support provided. Safety maintained with q 15 min checks.    

## 2016-07-24 NOTE — [facility] Group Notes (Signed)
[facility] LCSW Group Therapy Note  Date/Time: 07/24/16, 1300  Type of Therapy and Topic:  Group Therapy:  Feelings around Relapse and Recovery  Participation Level:  Active   Mood:  Description of Group:    Patients in this group will discuss emotions they experience before and after a relapse. They will process how experiencing these feelings, or avoidance of experiencing them, relates to having a relapse. Facilitator will guide patients to explore emotions they have related to recovery. Patients will be encouraged to process which emotions are more powerful. They will be guided to discuss the emotional reaction significant others in their lives may have to patients' relapse or recovery. Patients will be assisted in exploring ways to respond to the emotions of others without this contributing to a relapse.  Therapeutic Goals: 1. Patient will identify two or more emotions that lead to relapse for them:  2. Patient will identify two emotions that result when they relapse:  3. Patient will identify two emotions related to recovery:  4. Patient will demonstrate ability to communicate their needs through discussion and/or role plays.   Summary of Patient Progress: Pt asked if she could come to group, even though she had attended the same group in the AM as rec therapist group was cancelled this afternoon and she had nothing else to do.  Pt shared similar thoughts as she did in the AM group but was active and participated throughout group.     Therapeutic Modalities:   Cognitive Behavioral Therapy Solution-Focused Therapy Assertiveness Training Relapse Prevention Therapy  Daleen Squibb, LCSW

## 2016-07-25 DIAGNOSIS — F332 Major depressive disorder, recurrent severe without psychotic features: Secondary | ICD-10-CM

## 2016-07-25 LAB — LIPID PANEL
CHOL/HDL RATIO: 6.7 ratio
Cholesterol: 242 mg/dL — ABNORMAL HIGH (ref 0–200)
HDL: 36 mg/dL — AB (ref >40)
LDL Cholesterol: UNDETERMINED mg/dL (ref 0–99)
Triglycerides: 716 mg/dL — ABNORMAL HIGH (ref <150)
VLDL: UNDETERMINED mg/dL (ref 0–40)

## 2016-07-25 LAB — VITAMIN B12: Vitamin B-12: 365 pg/mL (ref 180–914)

## 2016-07-25 LAB — TSH: TSH: 1.309 u[IU]/mL (ref 0.350–4.500)

## 2016-07-25 MED ORDER — DULOXETINE HCL 20 MG PO CPEP
40.0000 mg | ORAL_CAPSULE | Freq: Every day | ORAL | Status: DC
  Administered 2016-07-26: 40 mg via ORAL
  Filled 2016-07-25: qty 2

## 2016-07-25 MED ORDER — GEMFIBROZIL 600 MG PO TABS
600.0000 mg | ORAL_TABLET | Freq: Two times a day (BID) | ORAL | Status: DC
  Administered 2016-07-25 – 2016-07-26 (×2): 600 mg via ORAL
  Filled 2016-07-25 (×2): qty 1

## 2016-07-25 NOTE — Progress Notes (Signed)
Pt pleasant and cooperative with care. Med and group compliant. Appropriate with staff and peers. Denies SI, Hi, AVH. Endorses depression.  Encouragement and support offered. Safety checks maintained. Pt remains safe on unit with q 15 min checks.

## 2016-07-25 NOTE — [facility] Suicide Risk Assessment (Signed)
[facility] INPATIENT:  Family/Significant Other Suicide Prevention Education  Suicide Prevention Education:  Education Completed; Harriett Sine McNeil(mother [phone])  has been identified by the patient as the family member/significant other with whom the patient will be residing, and identified as the person(s) who will aid the patient in the event of a mental health crisis (suicidal ideations/suicide attempt).  With written consent from the patient, the family member/significant other has been provided the following suicide prevention education, prior to the and/or following the discharge of the patient.  The suicide prevention education provided includes the following:  Suicide risk factors  Suicide prevention and interventions  National Suicide Hotline telephone number  [facility] assessment telephone number  [facility] Emergency Assistance 911  [facility] and/or Residential Mobile Crisis Unit telephone number  Request made of family/significant other to:  Remove weapons (e.g., guns, rifles, knives), all items previously/currently identified as safety concern.    Remove drugs/medications (over-the-counter, prescriptions, illicit drugs), all items previously/currently identified as a safety concern.  The family member/significant other verbalizes understanding of the suicide prevention education information provided.  The family member/significant other agrees to remove the items of safety concern listed above.  Shawna [name] G. Garnette Czech MSW, LCSWA 07/25/2016, 11:13 AM

## 2016-07-25 NOTE — [facility] Group Notes (Signed)
[facility] LCSW Group Therapy  07/25/2016 2:29 PM  Type of Therapy:  Group Therapy  Participation Level:  Active  Participation Quality:  Appropriate and Sharing  Affect:  Appropriate  Cognitive:  Alert  Insight:  Improving  Engagement in Therapy:  Engaged  Modes of Intervention:  Discussion, Education, Problem-solving, Dance movement psychotherapist, Socialization and Support  Summary of Progress/Problems: Coping Skills: Patients defined and discussed healthy coping skills. Patients identified healthy coping skills they would like to try during hospitalization and after discharge. CSW offered insight to varying coping skills that may have been new to patients such as practicing mindfulness. Patient stated her coping skills were spending time with her grandchildren, deep breathing, and crotcheting blankets and scarfs.   Audreyanna Butkiewicz G. Garnette Czech MSW, LCSWA 07/25/2016, 2:31 PM

## 2016-07-25 NOTE — Progress Notes (Signed)
[facility] MD Progress Note  07/25/2016 9:21 AM Shawna [name]  MRN:  [id number]   Subjective:   07/25/2016. Shawna [name] feels much better today after good night sleep. Her mood is improving, affect is brighter. She was started on Cymbalta and already feels less pain. Sh accepts medications and tolerates them well. Good program participation.  Per nursing: Received AAOx4, clean, cooperative with care. Able to make needs known. Overall depressed affect, appropriate interactions, visible on unit. Denies SI.HI.AVH. Complaints of chronic leg pains and GI upset, see eMAR for interventions. No other complaints at this time. Will continue to monitor and provide interventions as appropriate.   Principal Problem: Major depressive disorder, recurrent severe without psychotic features (HCC) Diagnosis:   Patient Active Problem List   Diagnosis Date Noted  . Major depressive disorder, recurrent severe without psychotic features (HCC) [F33.2] 07/23/2016  . Overdose of sedative or hypnotic [T42.71XA] 07/23/2016  . COPD exacerbation (HCC) [J44.1] 07/23/2016  . GERD (gastroesophageal reflux disease) [K21.9] 07/23/2016  . Cannabis use disorder, moderate, dependence (HCC) [F12.20] 07/23/2016  . Tobacco use disorder [F17.200] 07/23/2016  . Sedative, hypnotic or anxiolytic use disorder, severe, dependence (HCC) [F13.20] 07/23/2016  . Bacterial pneumonia [J15.9] 01/23/2015  . Leukocytosis [D72.829] 01/23/2015  . Sepsis (HCC) [A41.9] 01/23/2015  . Hypoxemia [R09.02] 01/23/2015  . Opioid use disorder, moderate, dependence (HCC) [F11.20] 12/31/2014  . Anxiety disorder, unspecified [F41.9] 12/31/2014   Total Time spent with patient: 20 minutes  Past Psychiatric History:  Depression.  Past Medical History:  Past Medical History:  Diagnosis Date  . Asthma   . Bulging lumbar disc   . Bulging lumbar disc   . GERD (gastroesophageal reflux disease)   . Insomnia   . Kidney stone   . Pneumonia DEC 2016  . Sciatic leg  pain    Right side  . Sciatica of right side   . Weakness    Bil knees, and ankles    Past Surgical History:  Procedure Laterality Date  . ABDOMINAL HYSTERECTOMY     Partial  . ESOPHAGOGASTRODUODENOSCOPY (EGD) WITH PROPOFOL N/A 11/27/2015   Procedure: ESOPHAGOGASTRODUODENOSCOPY (EGD) WITH PROPOFOL;  Surgeon: Christena Deem, MD;  Location: [facility] ENDOSCOPY;  Service: Endoscopy;  Laterality: N/A;  . SHOULDER SURGERY Right   . TONSILLECTOMY    . TUBAL LIGATION     Family History:  Family History  Problem Relation Age of Onset  . Fibromyalgia Mother    Family Psychiatric  History: see H&P. Social History:  History  Alcohol Use No     History  Drug Use No    Social History   Social History  . Marital status: Single    Spouse name: N/A  . Number of children: N/A  . Years of education: N/A   Social History Main Topics  . Smoking status: Current Every Day Smoker    Packs/day: 0.50    Years: 40.00    Types: Cigarettes  . Smokeless tobacco: Current User  . Alcohol use No  . Drug use: No  . Sexual activity: No   Other Topics Concern  . None   Social History Narrative  . None   Additional Social History:                         Sleep: Fair  Appetite:  Fair  Current Medications: Current Facility-Administered Medications  Medication Dose Route Frequency Provider Last Rate Last Dose  . acetaminophen (TYLENOL) tablet 650 mg  650 mg  Oral Q6H PRN Shari Prows, MD   650 mg at 07/25/16 0837  . albuterol (PROVENTIL HFA;VENTOLIN HFA) 108 (90 Base) MCG/ACT inhaler 2 puff  2 puff Inhalation Q6H PRN Tyrease Vandeberg B Cire Clute, MD      . alum & mag hydroxide-simeth (MAALOX/MYLANTA) 200-200-20 MG/5ML suspension 30 mL  30 mL Oral Q4H PRN Kennesha Brewbaker B Wissam Resor, MD   30 mL at 07/24/16 1521  . dicyclomine (BENTYL) capsule 10 mg  10 mg Oral TID AC & HS Jahad Old B Kile Kabler, MD   10 mg at 07/25/16 0820  . DULoxetine (CYMBALTA) DR capsule 20 mg  20 mg Oral Daily Himabindu  Ravi, MD   20 mg at 07/25/16 0820  . feeding supplement (ENSURE ENLIVE) (ENSURE ENLIVE) liquid 237 mL  237 mL Oral BID BM Jimmy Footman, MD   237 mL at 07/25/16 1000  . fluticasone (FLONASE) 50 MCG/ACT nasal spray 2 spray  2 spray Each Nare Daily Shari Prows, MD   2 spray at 07/25/16 0821  . lidocaine (XYLOCAINE) 5 % ointment   Topical Daily PRN Himabindu Ravi, MD      . loratadine (CLARITIN) tablet 10 mg  10 mg Oral Daily Etter Royall B Jameel Quant, MD   10 mg at 07/25/16 0820  . magnesium hydroxide (MILK OF MAGNESIA) suspension 30 mL  30 mL Oral Daily PRN Arnesha Schiraldi B Wilkie Zenon, MD      . nicotine (NICODERM CQ - dosed in mg/24 hours) patch 21 mg  21 mg Transdermal Daily Himabindu Ravi, MD   21 mg at 07/25/16 0820  . pantoprazole (PROTONIX) EC tablet 40 mg  40 mg Oral Daily Himabindu Ravi, MD   40 mg at 07/25/16 0820  . QUEtiapine (SEROQUEL) tablet 100 mg  100 mg Oral QHS Himabindu Ravi, MD   100 mg at 07/24/16 2116  . QUEtiapine (SEROQUEL) tablet 25 mg  25 mg Oral TID PRN Shari Prows, MD      . traZODone (DESYREL) tablet 100 mg  100 mg Oral QHS PRN Shari Prows, MD   100 mg at 07/24/16 0015    Lab Results:  No results found for this or any previous visit (from the past 48 hour(s)).  Blood Alcohol level:  Lab Results  Component Value Date   ETH <5 07/23/2016   ETH <5 12/30/2014    Metabolic Disorder Labs: Lab Results  Component Value Date   HGBA1C 5.6 01/23/2015   No results found for: PROLACTIN Lab Results  Component Value Date   CHOL 160 10/28/2011   TRIG 344 (H) 10/28/2011   HDL 23 (L) 10/28/2011   VLDL 69 (H) 10/28/2011   LDLCALC 68 10/28/2011    Physical Findings: AIMS:  , ,  ,  ,    CIWA:    COWS:     Musculoskeletal: Strength & Muscle Tone: within normal limits Gait & Station: normal Patient leans: N/A  Psychiatric Specialty Exam: Physical Exam  Nursing note and vitals reviewed. Psychiatric: Her speech is normal and behavior is  normal. Cognition and memory are normal. She expresses impulsivity. She exhibits a depressed mood.    Review of Systems  Psychiatric/Behavioral: Positive for depression and substance abuse.  All other systems reviewed and are negative.   Blood pressure 98/70, pulse 80, temperature 98 F (36.7 C), temperature source Oral, resp. rate 18, height  (1.676 m), weight 60.8 kg (134 lb), SpO2 100 %.Body mass index is 21.63 kg/m.  General Appearance: Casual  Eye Contact:  Good  Speech:  Clear and Coherent  Volume:  Normal  Mood:  Depressed  Affect:  Appropriate  Thought Process:  Goal Directed and Descriptions of Associations: Intact  Orientation:  Full (Time, Place, and Person)  Thought Content:  WDL  Suicidal Thoughts:  No  Homicidal Thoughts:  No  Memory:  Immediate;   Fair Recent;   Fair Remote;   Fair  Judgement:  Impaired  Insight:  Lacking  Psychomotor Activity:  Psychomotor Retardation  Concentration:  Concentration: Fair and Attention Span: Fair  Recall:  [employer] of Knowledge:  Fair  Language:  Fair  Akathisia:  No  Handed:  Right  AIMS (if indicated):     Assets:  Communication Skills Desire for Improvement Housing Physical Health Resilience Social Support  ADL's:  Intact  Cognition:  WNL  Sleep:  Number of Hours: 6     Treatment Plan Summary: Daily contact with patient to assess and evaluate symptoms and progress in treatment and Medication management   Shawna [name] is a 51 year old female with a history of depression and chronic pain who presented after an overdose on Ambien, Klonopin and Atarax.  1. Suicidal ideation. The patient is able to contract for safety.  2. Major depressive disorder. Increase Cymbalta to 40 mg daily. Educated about the side effects and benefits. Patient to start attending groups and participate and develop coping skills to deal with her emotional dysregulation and improve communication skills.  3. Anxiety. Seroquel 25 mg as needed  3 times daily.  4. Insomnia. Resolved with higher dose of Seroquel 100 mg at bedtime. We will discontinue Trazodone.  5. Labs- CBC within normal limits, CMP within normal limits except for slightly elevated glucose.  6. Metabolic syndrome monitoring. Liipid panel, TSH and HgA1c are pending.   7. Urine drug screen positive for cannabis and prescribed benzodiazepines. The patient minimizes problems and declines treatment.  8. COPD. She is on Albuterol.  9. Weight loss. Ensure is available.  10. Chronic pain. Lidocaine patch is available. She takes Tylenol as she is unable to tolerate NSAID.  11. Smoking. Nicotine patch is available.  12. IBS. She is on Bentyl.  13. EKG. Normal sinus rhythm. QTC 493.  14. Disposition. The patient will be discharged to her home. She will follow up with Dr. Janeece Riggers at [facility].   Kristine Linea, MD 07/25/2016, 9:21 AM

## 2016-07-25 NOTE — Progress Notes (Signed)
Passive SI, no plan. contracts for safety. Denies HI/AVH. Affect depressed. Speech is soft and slow. C/o chronic leg pain and GI issues. Pt compliant with medications. Voices no additional concerns at this time. Visible in milieu interacting with peers appropriately. Encouragement and support provided. Safety maintained. Will continue to monitor.

## 2016-07-25 NOTE — Plan of Care (Signed)
Problem: Pain Managment: Goal: General experience of comfort will improve Outcome: Not Progressing Pt continues to complain of pain

## 2016-07-25 NOTE — [facility] Group Notes (Signed)
[facility] Group Notes:  (Nursing/MHT/Case Management/Adjunct)  Date:  07/25/2016  Time:  11:04 PM  Type of Therapy:  Evening Wrap-up Group  Participation Level:  Active  Participation Quality:  Appropriate and Attentive  Affect:  Appropriate  Cognitive:  Alert and Appropriate  Insight:  Appropriate and Good  Engagement in Group:  Engaged  Modes of Intervention:  Discussion  Summary of Progress/Problems:  Tomasita Morrow 07/25/2016, 11:04 PM

## 2016-07-26 DIAGNOSIS — E785 Hyperlipidemia, unspecified: Secondary | ICD-10-CM | POA: Diagnosis present

## 2016-07-26 DIAGNOSIS — E538 Deficiency of other specified B group vitamins: Secondary | ICD-10-CM | POA: Diagnosis present

## 2016-07-26 LAB — HEMOGLOBIN A1C
Hgb A1c MFr Bld: 5.4 % (ref 4.8–5.6)
Mean Plasma Glucose: 108 mg/dL

## 2016-07-26 MED ORDER — GEMFIBROZIL 600 MG PO TABS: 600.0000 mg | ORAL_TABLET | Freq: Two times a day (BID) | ORAL | 1 refills | Status: DC

## 2016-07-26 MED ORDER — CYANOCOBALAMIN 1000 MCG PO TABS: 1000.0000 ug | ORAL_TABLET | Freq: Every day | ORAL | 1 refills | Status: DC

## 2016-07-26 MED ORDER — VITAMIN B-12 1000 MCG PO TABS: 1000.0000 ug | ORAL_TABLET | Freq: Every day | ORAL | Status: DC

## 2016-07-26 MED ORDER — LIDOCAINE 5 % EX OINT: TOPICAL_OINTMENT | Freq: Every day | CUTANEOUS | 1 refills | Status: DC | PRN

## 2016-07-26 MED ORDER — QUETIAPINE FUMARATE 25 MG PO TABS: 25.0000 mg | ORAL_TABLET | Freq: Three times a day (TID) | ORAL | 1 refills | Status: DC | PRN

## 2016-07-26 MED ORDER — QUETIAPINE FUMARATE 100 MG PO TABS: 100.0000 mg | ORAL_TABLET | Freq: Every day | ORAL | 1 refills | Status: DC

## 2016-07-26 MED ORDER — DULOXETINE HCL 40 MG PO CPEP: 40.0000 mg | ORAL_CAPSULE | Freq: Every day | ORAL | 1 refills | Status: DC

## 2016-07-26 NOTE — Progress Notes (Signed)
Patient ID: Shawna [name], female   DOB: 10-21-1965, 51 y.o.   MRN: [id number]  CSW meet with patient to discuss discharge plan. Patient follows-up with Dr. Janeece Riggers at [facility]. Informed patient that she would be contacted tomorrow by assigned CSW once follow-up appointment is scheduled. Patient provided CSW with her contact information [phone]) so that she could be called and informed of her follow-up appointment. Patient will be picked up by family member at discharge. Patient had no further questions for CSW at this time.   Jamileth Putzier G. Garnette Czech MSW, LCSWA 07/26/2016 10:15 AM

## 2016-07-26 NOTE — Progress Notes (Addendum)
D: Patient is aware of  Discharge this shift .Patient denies suicidal /homicidal ideations. Patient received all belongings brought in  A: No Storage medications. Writer reviewed Discharge Summary, Suicide Risk Assessment, and Transitional Record. Patient also received Prescriptions   from  MD.  A: Writer instructed on discharge criteria  .  and prescriptions  given to patient. Aware  Of follow up appointment  And staff to call her  On Monday R: Patient left unit with no questions  Or concerns  With sister

## 2016-07-26 NOTE — Progress Notes (Signed)
Pt denies SI/HI/AVH. Pt asked several questions regarding medication administration times. She had to be told several times that she is taking Cymbalta in the a.m. And Seroquel in the evening. Pt mildly confused. Affect anxious. Attended evening wrap up group. Did not endorse pain this shift. Encouragement and support provided. Safety maintained. Will continue to monitor.

## 2016-07-26 NOTE — Progress Notes (Addendum)
  [facility] Adult Case Management Discharge Plan :  Will you be returning to the same living situation after discharge:  Yes,  home At discharge, do you have transportation home?: Yes,  family Do you have the ability to pay for your medications: Yes,  patient has insurance  Release of information consent forms completed and in the chart;  [name] needed at discharge.  Patient to Follow up at: Follow-up Information    [facility]. Go to.   Why:  Follow-up with Dr. Janeece Riggers for medication management. Social Worker will contact you tomorrow when agency opens to schedule follow-up appointment. Outpatient therapy can also be provided. This can be scheduled at your next appointment with Dr. Mickie Bail information: [address] [place] [address] [phone]        [facility]. Go to.   Why:  Follow-up with [facility] for crisis services if needed. Walk-in hours are Margorie John, F 8am-3pm.  Contact information: [address] [place] [address] [phone]           Next level of care provider has access to [facility] Link:no  Safety Planning and Suicide Prevention discussed: Yes,  with patient and her mother.   Have you used any form of tobacco in the last 30 days? (Cigarettes, Smokeless Tobacco, Cigars, and/or Pipes): Yes  Has patient been referred to the Quitline?: Patient refused referral  Patient has been referred for addiction treatment: Yes  Shawna [name], LCSWA 07/26/2016, 10:15 AM

## 2016-07-26 NOTE — Plan of Care (Signed)
Problem: Activity: Goal: Sleeping patterns will improve Outcome: Progressing Pt sleeping 6 or more hours per night   

## 2016-07-26 NOTE — [facility] Suicide Risk Assessment (Signed)
[facility] Discharge Suicide Risk Assessment   Principal Problem: Major depressive disorder, recurrent severe without psychotic features [facility]) Discharge Diagnoses:  Patient Active Problem List   Diagnosis Date Noted  . Dyslipidemia [E78.5] 07/26/2016  . Low vitamin B12 level [E53.8] 07/26/2016  . Major depressive disorder, recurrent severe without psychotic features (HCC) [F33.2] 07/23/2016  . Overdose of sedative or hypnotic [T42.71XA] 07/23/2016  . COPD exacerbation (HCC) [J44.1] 07/23/2016  . GERD (gastroesophageal reflux disease) [K21.9] 07/23/2016  . Cannabis use disorder, moderate, dependence (HCC) [F12.20] 07/23/2016  . Tobacco use disorder [F17.200] 07/23/2016  . Sedative, hypnotic or anxiolytic use disorder, severe, dependence (HCC) [F13.20] 07/23/2016  . Bacterial pneumonia [J15.9] 01/23/2015  . Leukocytosis [D72.829] 01/23/2015  . Sepsis (HCC) [A41.9] 01/23/2015  . Hypoxemia [R09.02] 01/23/2015  . Opioid use disorder, moderate, dependence (HCC) [F11.20] 12/31/2014  . Anxiety disorder, unspecified [F41.9] 12/31/2014    Total Time spent with patient: 30 minutes  Musculoskeletal: Strength & Muscle Tone: within normal limits Gait & Station: normal Patient leans: N/A  Psychiatric Specialty Exam: Review of Systems  All other systems reviewed and are negative.   Blood pressure 111/74, pulse 75, temperature 98.1 F (36.7 C), resp. rate 18, height  (1.676 m), weight 60.8 kg (134 lb), SpO2 100 %.Body mass index is 21.63 kg/m.  General Appearance: Casual  Eye Contact::  Good  Speech:  Clear and Coherent409  Volume:  Normal  Mood:  Euthymic  Affect:  Appropriate  Thought Process:  Goal Directed and Descriptions of Associations: Intact  Orientation:  Full (Time, Place, and Person)  Thought Content:  WDL  Suicidal Thoughts:  No  Homicidal Thoughts:  No  Memory:  Immediate;   Fair Recent;   Fair Remote;   Fair  Judgement:  Impaired  Insight:  Present  Psychomotor  Activity:  Normal  Concentration:  Fair  Recall:  [employer] of Knowledge:Fair  Language: Fair  Akathisia:  No  Handed:  Right  AIMS (if indicated):     Assets:  Communication Skills Desire for Improvement Financial Resources/Insurance Housing Physical Health Resilience Social Support  Sleep:  Number of Hours: 6.15  Cognition: WNL  ADL's:  Intact   Mental Status Per Nursing Assessment::   On Admission:  Self-harm thoughts, Self-harm behaviors, Belief that plan would result in death  Demographic Factors:  Divorced or widowed, Caucasian, Low socioeconomic status and Living alone  Loss Factors: Decline in physical health  Historical Factors: Prior suicide attempts, Family history of mental illness or substance abuse and Impulsivity  Risk Reduction Factors:   Sense of responsibility to family, Positive social support and Positive therapeutic relationship  Continued Clinical Symptoms:  Depression:   Comorbid alcohol abuse/dependence Impulsivity Alcohol/Substance Abuse/Dependencies Chronic Pain  Cognitive Features That Contribute To Risk:  None    Suicide Risk:  Minimal: No identifiable suicidal ideation.  Patients presenting with no risk factors but with morbid ruminations; may be classified as minimal risk based on the severity of the depressive symptoms    Plan Of Care/Follow-up recommendations:  Activity:  as tolerated. Diet:  low sodium heart healthy. Other:  keep follow up appointments.   Shawna Linea, MD 07/26/2016, 9:14 AM

## 2016-07-26 NOTE — Discharge Summary (Signed)
Physician Discharge Summary Note  Patient:  Shawna [name] is an 51 y.o., female MRN:  [id number] DOB:  1965/05/29 Patient phone:  [phone] (home)  Patient address:   [address] [place] [address],  Total Time spent with patient: 30 minutes  Date of Admission:  07/23/2016 Date of Discharge: 07/26/2016  Reason for Admission:  Overdose.  History of Present Illness: Per initial assessment ,"Shawna [name] an 51 y.o.femalethat reports taking an intentional overdose on approximately, 8 Ambien, 45 Klonopin, and 45 Atarax, taken at approximately 0300 this AM. Patient is a poor historian. Patient was anxious during the assessment.   Patient reports increased depression associated due to increased pain in her back and legs. Patient reports that she lives alone and does not have any family. Patient Patient denies HI/ Psychosis/Substance Abuse.   Patient reports a past history of inpatient psychiatric hospitalizations but she is not able to remember the name of the facility or when she was admitted to the facility. "  Patient was seen this morning initially by herself and also in treatment team. She is not able to give much information about what led to this overdose. States that she did take some antidepressants many years ago. She does state that she does not have depression and this was mainly because of the situation with her children. Again she does not elaborate on this. Patient's main focuses on her pain. We discussed starting on Cymbalta to help with the pain and her mood and she is agreeable. Also reports she did not sleep well on the Seroquel. Denies any psychotic symptoms. Denies any suicidal thoughts today. Denies any problems with drugs or alcohol.  Past Psychiatric History: Patient has been hospitalized psychiatrically previously but states she is unable to remember anything.   Family Psychiatric  History: None reported.  Social History: Patient lives alone and  has 3 children. She is currently not working and gets an SSI check. States that she was in some accident.  Principal Problem: Major depressive disorder, recurrent severe without psychotic features [facility]) Discharge Diagnoses: Patient Active Problem List   Diagnosis Date Noted  . Dyslipidemia [E78.5] 07/26/2016  . Low vitamin B12 level [E53.8] 07/26/2016  . Major depressive disorder, recurrent severe without psychotic features (HCC) [F33.2] 07/23/2016  . Overdose of sedative or hypnotic [T42.71XA] 07/23/2016  . COPD exacerbation (HCC) [J44.1] 07/23/2016  . GERD (gastroesophageal reflux disease) [K21.9] 07/23/2016  . Cannabis use disorder, moderate, dependence (HCC) [F12.20] 07/23/2016  . Tobacco use disorder [F17.200] 07/23/2016  . Sedative, hypnotic or anxiolytic use disorder, severe, dependence (HCC) [F13.20] 07/23/2016  . Bacterial pneumonia [J15.9] 01/23/2015  . Leukocytosis [D72.829] 01/23/2015  . Sepsis (HCC) [A41.9] 01/23/2015  . Hypoxemia [R09.02] 01/23/2015  . Opioid use disorder, moderate, dependence (HCC) [F11.20] 12/31/2014  . Anxiety disorder, unspecified [F41.9] 12/31/2014   Past Medical History:  Past Medical History:  Diagnosis Date  . Asthma   . Bulging lumbar disc   . Bulging lumbar disc   . GERD (gastroesophageal reflux disease)   . Insomnia   . Kidney stone   . Pneumonia DEC 2016  . Sciatic leg pain    Right side  . Sciatica of right side   . Weakness    Bil knees, and ankles    Past Surgical History:  Procedure Laterality Date  . ABDOMINAL HYSTERECTOMY     Partial  . ESOPHAGOGASTRODUODENOSCOPY (EGD) WITH PROPOFOL N/A 11/27/2015   Procedure: ESOPHAGOGASTRODUODENOSCOPY (EGD) WITH PROPOFOL;  Surgeon: Christena Deem,  MD;  Location: [facility] ENDOSCOPY;  Service: Endoscopy;  Laterality: N/A;  . SHOULDER SURGERY Right   . TONSILLECTOMY    . TUBAL LIGATION     Family History:  Family History  Problem Relation Age of Onset  . Fibromyalgia Mother     Social  History:  History  Alcohol Use No     History  Drug Use No    Social History   Social History  . Marital status: Single    Spouse name: N/A  . Number of children: N/A  . Years of education: N/A   Social History Main Topics  . Smoking status: Current Every Day Smoker    Packs/day: 0.50    Years: 40.00    Types: Cigarettes  . Smokeless tobacco: Current User  . Alcohol use No  . Drug use: No  . Sexual activity: No   Other Topics Concern  . None   Social History Narrative  . None    Hospital Course:    Ms. Rodger is a 51 year old female with a history of depression and chronic pain who presented after an overdose on Ambien, Klonopin and Atarax.  1. Suicidal ideation. Resolved. The patient is able to contract for safety. She is forward thinking and optimistic about the future.  2. Major depressive disorder. We started Cymbalta for depression and chronic pain and Seroquel for mood stabilization. The patient was educated about the side effects and benefits.   3. Anxiety. Seroquel 25 mg as needed 3 times daily.  4. Insomnia. Resolved with Seroquel .   5. Benzodiazepine use. They are prescribed by her primary psychiatrist,. No benzodiazepines or Ambien were offered. There were no symptoms of withdrawal.   6. Metabolic syndrome monitoring. Liipid panel shows elevated triglycerides. We started Lopid. TSH and HgA1c are pending.   7. Urine drug screen positive for cannabis and prescribed benzodiazepines. The patient minimizes problems and declines treatment.  8. COPD. She is on Albuterol.  9. Weight loss. Ensure is available.  10. Chronic pain. Lidocaine patch is available. She takes Tylenol as she is unable to tolerate NSAID.  11. GERD. She is on Protonix.   12. Low Vit B12. Started supplementation.  13. Smoking. Nicotine patch is available.  14. IBS. She is on Bentyl.  15. EKG. Normal sinus rhythm. QTC 493.  16. Disposition. The patientwill be  discharged to her home. She will follow up with Dr. Janeece Riggers at [facility].   Physical Findings: AIMS:  , ,  ,  ,    CIWA:    COWS:     Musculoskeletal: Strength & Muscle Tone: within normal limits Gait & Station: normal Patient leans: N/A  Psychiatric Specialty Exam: Physical Exam  Nursing note and vitals reviewed. Psychiatric: She has a normal mood and affect. Her speech is normal and behavior is normal. Thought content normal. Cognition and memory are normal. She expresses impulsivity.    Review of Systems  Musculoskeletal: Positive for joint pain and myalgias.  All other systems reviewed and are negative.   Blood pressure 111/74, pulse 75, temperature 98.1 F (36.7 C), resp. rate 18, height  (1.676 m), weight 60.8 kg (134 lb), SpO2 100 %.Body mass index is 21.63 kg/m.  General Appearance: Casual  Eye Contact:  Good  Speech:  Clear and Coherent  Volume:  Normal  Mood:  Euthymic  Affect:  Appropriate  Thought Process:  Goal Directed and Descriptions of Associations: Intact  Orientation:  Full (Time, Place, and Person)  Thought Content:  WDL  Suicidal Thoughts:  No  Homicidal Thoughts:  No  Memory:  Immediate;   Fair Recent;   Fair Remote;   Fair  Judgement:  Impaired  Insight:  Present  Psychomotor Activity:  Normal  Concentration:  Concentration: Fair and Attention Span: Fair  Recall:  [employer] of Knowledge:  Fair  Language:  Fair  Akathisia:  No  Handed:  Right  AIMS (if indicated):     Assets:  Communication Skills Desire for Improvement Financial Resources/Insurance Housing Physical Health Resilience Social Support  ADL's:  Intact  Cognition:  WNL  Sleep:  Number of Hours: 6.15     Have you used any form of tobacco in the last 30 days? (Cigarettes, Smokeless Tobacco, Cigars, and/or Pipes): Yes  Has this patient used any form of tobacco in the last 30 days? (Cigarettes, Smokeless Tobacco, Cigars, and/or Pipes) Yes, Yes, A prescription for an FDA-approved  tobacco cessation medication was offered at discharge and the patient refused  Blood Alcohol level:  Lab Results  Component Value Date   [facility] <5 07/23/2016   ETH <5 12/30/2014    Metabolic Disorder Labs:  Lab Results  Component Value Date   HGBA1C 5.6 01/23/2015   No results found for: PROLACTIN Lab Results  Component Value Date   CHOL 242 (H) 07/25/2016   TRIG 716 (H) 07/25/2016   HDL 36 (L) 07/25/2016   CHOLHDL 6.7 07/25/2016   VLDL UNABLE TO CALCULATE IF TRIGLYCERIDE OVER 400 mg/dL 16/01/9603   LDLCALC UNABLE TO CALCULATE IF TRIGLYCERIDE OVER 400 mg/dL 54/12/8117   LDLCALC 68 10/28/2011    See Psychiatric Specialty Exam and Suicide Risk Assessment completed by Attending Physician prior to discharge.  Discharge destination:  Home  Is patient on multiple antipsychotic therapies at discharge:  No   Has Patient had three or more failed trials of antipsychotic monotherapy by history:  No  Recommended Plan for Multiple Antipsychotic Therapies: NA  Discharge Instructions    Diet - low sodium heart healthy    Complete by:  As directed    Increase activity slowly    Complete by:  As directed      Allergies as of 07/26/2016      Reactions   Ciprofloxacin Hcl Nausea Only   Fentanyl Nausea And Vomiting, Other (See Comments)   Patient states this makes her have severe flu symptoms.   Haldol [haloperidol]    Made pt "feel really weird".   Morphine And Related Nausea And Vomiting   Patient states this medication makes her have sever flu symptoms.   Propofol    Vicodin [hydrocodone-acetaminophen] Nausea And Vomiting      Medication List    STOP taking these medications   aluminum-magnesium hydroxide 200-200 MG/5ML suspension   clonazePAM 1 MG tablet Commonly known as:  KLONOPIN   docusate sodium 100 MG capsule Commonly known as:  COLACE   famotidine 40 MG tablet Commonly known as:  PEPCID   oxyCODONE-acetaminophen 5-325 MG tablet Commonly known as:  ROXICET    predniSONE 20 MG tablet Commonly known as:  DELTASONE   traMADol 50 MG tablet Commonly known as:  ULTRAM   zolpidem 10 MG tablet Commonly known as:  AMBIEN     TAKE these medications     Indication  acetaminophen 500 MG tablet Commonly known as:  TYLENOL Take 500 mg by mouth every 6 (six) hours as needed.  Indication:  Pain   albuterol 108 (90 Base) MCG/ACT inhaler Commonly known as:  PROVENTIL HFA;VENTOLIN HFA Inhale 2 puffs into the lungs every 6 (six) hours as needed for wheezing or shortness of breath.  Indication:  Chronic Obstructive Lung Disease   BENTYL 10 MG capsule Generic drug:  dicyclomine Take 10 mg by mouth 4 (four) times daily -  before meals and at bedtime.  Indication:  Irritable Bowel Syndrome   cyanocobalamin 1000 MCG tablet Take 1 tablet (1,000 mcg total) by mouth daily.  Indication:  Inadequate Vitamin B12   dexlansoprazole 60 MG capsule Commonly known as:  DEXILANT Take 1 capsule by mouth daily.  Indication:  Gastroesophageal Reflux Disease   DULoxetine HCl 40 MG Cpep Take 40 mg by mouth daily. Start taking on:  07/27/2016  Indication:  Major Depressive Disorder   fexofenadine 180 MG tablet Commonly known as:  ALLEGRA Take 180 mg by mouth daily.  Indication:  Hayfever   fluticasone 50 MCG/ACT nasal spray Commonly known as:  FLONASE Place 1 spray into both nostrils daily.  Indication:  Signs and Symptoms of Nose Diseases   gemfibrozil 600 MG tablet Commonly known as:  LOPID Take 1 tablet (600 mg total) by mouth 2 (two) times daily before a meal.  Indication:  Increased Fats, Triglycerides & Cholesterol in the Blood   hydrOXYzine 50 MG tablet Commonly known as:  ATARAX/VISTARIL Take 50 mg by mouth every 6 (six) hours as needed.  Indication:  Anxiety Neurosis   lidocaine 5 % ointment Commonly known as:  XYLOCAINE Apply topically daily as needed (once daily for leg pain).  Indication:  leg pain   loperamide 2 MG tablet Commonly  known as:  LOPERAMIDE A-D Take 1 tablet (2 mg total) by mouth 4 (four) times daily as needed for diarrhea or loose stools.  Indication:  Diarrhea   ondansetron 4 MG disintegrating tablet Commonly known as:  ZOFRAN ODT Take 1 tablet (4 mg total) by mouth every 8 (eight) hours as needed for nausea or vomiting.  Indication:  Nausea and Vomiting Following an Operation   QUEtiapine 100 MG tablet Commonly known as:  SEROQUEL Take 1 tablet (100 mg total) by mouth at bedtime.  Indication:  Depressive Phase of Manic-Depression   QUEtiapine 25 MG tablet Commonly known as:  SEROQUEL Take 1 tablet (25 mg total) by mouth 3 (three) times daily as needed (anxiety).  Indication:  Depressive Phase of Manic-Depression   sucralfate 1 g tablet Commonly known as:  CARAFATE Take 1 g by mouth 4 (four) times daily -  with meals and at bedtime.  Indication:  Ulcer of the Duodenum        Follow-up recommendations:  Activity:  as tolerated. Diet:  low sodium heart healthy. Other:  keep follow up appointments.  Comments:     Signed: Kristine Linea, MD 07/26/2016, 9:14 AM

## 2016-07-29 NOTE — Progress Notes (Signed)
Patient ID: BONNETTA ALLBEE, female   DOB: November 27, 1965, 51 y.o.   MRN: [id number]  CSW called Mrs. Toniann Fail, former patient to discuss follow-up appointment with Dr. Janeece Riggers at [facility]. Patient has appointment on April 19th, 2018 at 10:20am for medication management. Mrs. Nakyah stated she would follow-up with [facility] for crisis services if needed before her appointment with Dr. Janeece Riggers. CSW reviewed walk-in hours for [facility] with Mrs. Toniann Fail. Mrs. Jla had no further questions for CSW at this time.   Loie Jahr G. Garnette Czech MSW, [facility] 07/29/2016 12:42 PM

## 2016-08-13 ENCOUNTER — Other Ambulatory Visit: Payer: Self-pay | Admitting: Gastroenterology

## 2016-08-13 DIAGNOSIS — R110 Nausea: Secondary | ICD-10-CM

## 2017-01-17 ENCOUNTER — Emergency Department: Payer: Medicaid Other

## 2017-01-17 ENCOUNTER — Encounter: Payer: Self-pay | Admitting: Emergency Medicine

## 2017-01-17 ENCOUNTER — Emergency Department
Admission: EM | Admit: 2017-01-17 | Discharge: 2017-01-17 | Disposition: A | Discharge status: Home / Self Care | Payer: Medicaid Other | Attending: Emergency Medicine | Admitting: Emergency Medicine

## 2017-01-17 DIAGNOSIS — K529 Noninfective gastroenteritis and colitis, unspecified: Secondary | ICD-10-CM | POA: Diagnosis not present

## 2017-01-17 DIAGNOSIS — J45909 Unspecified asthma, uncomplicated: Secondary | ICD-10-CM | POA: Diagnosis not present

## 2017-01-17 DIAGNOSIS — F17220 Nicotine dependence, chewing tobacco, uncomplicated: Secondary | ICD-10-CM | POA: Diagnosis not present

## 2017-01-17 DIAGNOSIS — F17210 Nicotine dependence, cigarettes, uncomplicated: Secondary | ICD-10-CM | POA: Diagnosis not present

## 2017-01-17 DIAGNOSIS — J449 Chronic obstructive pulmonary disease, unspecified: Secondary | ICD-10-CM | POA: Diagnosis not present

## 2017-01-17 DIAGNOSIS — R1032 Left lower quadrant pain: Secondary | ICD-10-CM | POA: Diagnosis present

## 2017-01-17 DIAGNOSIS — Z79899 Other long term (current) drug therapy: Secondary | ICD-10-CM | POA: Diagnosis not present

## 2017-01-17 LAB — URINALYSIS, COMPLETE (UACMP) WITH MICROSCOPIC
BACTERIA UA: NONE SEEN
BILIRUBIN URINE: NEGATIVE
Glucose, UA: NEGATIVE mg/dL
HGB URINE DIPSTICK: NEGATIVE
KETONES UR: NEGATIVE mg/dL
LEUKOCYTES UA: NEGATIVE
NITRITE: NEGATIVE
PROTEIN: NEGATIVE mg/dL
RBC / HPF: NONE SEEN RBC/hpf (ref 0–5)
Specific Gravity, Urine: 1.002 — ABNORMAL LOW (ref 1.005–1.030)
pH: 7 (ref 5.0–8.0)

## 2017-01-17 LAB — CBC WITH DIFFERENTIAL/PLATELET
BASOS PCT: 1 %
Basophils Absolute: 0.1 10*3/uL (ref 0–0.1)
EOS ABS: 0.2 10*3/uL (ref 0–0.7)
Eosinophils Relative: 3 %
HEMATOCRIT: 40.7 % (ref 35.0–47.0)
HEMOGLOBIN: 14.3 g/dL (ref 12.0–16.0)
Lymphocytes Relative: 30 %
Lymphs Abs: 2.1 10*3/uL (ref 1.0–3.6)
MCH: 32.8 pg (ref 26.0–34.0)
MCHC: 35.1 g/dL (ref 32.0–36.0)
MCV: 93.3 fL (ref 80.0–100.0)
MONOS PCT: 3 %
Monocytes Absolute: 0.2 10*3/uL (ref 0.2–0.9)
NEUTROS ABS: 4.4 10*3/uL (ref 1.4–6.5)
Neutrophils Relative %: 63 %
Platelets: 309 10*3/uL (ref 150–440)
RBC: 4.36 MIL/uL (ref 3.80–5.20)
RDW: 13.5 % (ref 11.5–14.5)
WBC: 7 10*3/uL (ref 3.6–11.0)

## 2017-01-17 LAB — BASIC METABOLIC PANEL
Anion gap: 10 (ref 5–15)
BUN: 5 mg/dL — ABNORMAL LOW (ref 6–20)
CHLORIDE: 109 mmol/L (ref 101–111)
CO2: 22 mmol/L (ref 22–32)
Calcium: 9.2 mg/dL (ref 8.9–10.3)
Creatinine, Ser: 0.71 mg/dL (ref 0.44–1.00)
GFR calc non Af Amer: >60 mL/min (ref >60)
Glucose, Bld: 121 mg/dL — ABNORMAL HIGH (ref 65–99)
POTASSIUM: 3.7 mmol/L (ref 3.5–5.1)
SODIUM: 141 mmol/L (ref 135–145)

## 2017-01-17 LAB — HEPATIC FUNCTION PANEL
ALBUMIN: 4.2 g/dL (ref 3.5–5.0)
ALK PHOS: 69 U/L (ref 38–126)
ALT: 12 U/L — AB (ref 14–54)
AST: 22 U/L (ref 15–41)
Bilirubin, Direct: <0.1 mg/dL — ABNORMAL LOW (ref 0.1–0.5)
TOTAL PROTEIN: 7.2 g/dL (ref 6.5–8.1)
Total Bilirubin: 0.8 mg/dL (ref 0.3–1.2)

## 2017-01-17 LAB — LIPASE, BLOOD: Lipase: 18 U/L (ref 11–51)

## 2017-01-17 MED ORDER — METRONIDAZOLE 500 MG PO TABS: 500.0000 mg | ORAL_TABLET | Freq: Three times a day (TID) | ORAL | 0 refills | Status: DC

## 2017-01-17 MED ORDER — METRONIDAZOLE 500 MG PO TABS
500.0000 mg | ORAL_TABLET | Freq: Once | ORAL | Status: AC
  Administered 2017-01-17: 500 mg via ORAL
  Filled 2017-01-17: qty 1

## 2017-01-17 MED ORDER — ONDANSETRON 4 MG PO TBDP
4.0000 mg | ORAL_TABLET | Freq: Once | ORAL | Status: AC
  Administered 2017-01-17: 4 mg via ORAL
  Filled 2017-01-17: qty 1

## 2017-01-17 MED ORDER — HYDROCODONE-ACETAMINOPHEN 5-325 MG PO TABS
1.0000 | ORAL_TABLET | Freq: Once | ORAL | Status: AC
  Administered 2017-01-17: 1 via ORAL
  Filled 2017-01-17: qty 1

## 2017-01-17 MED ORDER — AMOXICILLIN-POT CLAVULANATE 875-125 MG PO TABS
1.0000 | ORAL_TABLET | Freq: Once | ORAL | Status: AC
  Administered 2017-01-17: 1 via ORAL
  Filled 2017-01-17: qty 1

## 2017-01-17 MED ORDER — MORPHINE SULFATE (PF) 4 MG/ML IV SOLN
4.0000 mg | Freq: Once | INTRAVENOUS | Status: AC
  Administered 2017-01-17: 4 mg via INTRAVENOUS
  Filled 2017-01-17: qty 1

## 2017-01-17 MED ORDER — AMOXICILLIN-POT CLAVULANATE 875-125 MG PO TABS: 1.0000 | ORAL_TABLET | Freq: Two times a day (BID) | ORAL | 0 refills | Status: DC

## 2017-01-17 MED ORDER — SODIUM CHLORIDE 0.9 % IV BOLUS (SEPSIS)
1000.0000 mL | Freq: Once | INTRAVENOUS | Status: AC
  Administered 2017-01-17: 1000 mL via INTRAVENOUS

## 2017-01-17 MED ORDER — IOPAMIDOL (ISOVUE-300) INJECTION 61%
100.0000 mL | Freq: Once | INTRAVENOUS | Status: AC | PRN
  Administered 2017-01-17: 100 mL via INTRAVENOUS

## 2017-01-17 MED ORDER — ONDANSETRON 4 MG PO TBDP: 4.0000 mg | ORAL_TABLET | Freq: Three times a day (TID) | ORAL | 0 refills | Status: DC | PRN

## 2017-01-17 MED ORDER — ONDANSETRON HCL 4 MG/2ML IJ SOLN
4.0000 mg | Freq: Once | INTRAMUSCULAR | Status: AC
  Administered 2017-01-17: 4 mg via INTRAVENOUS
  Filled 2017-01-17: qty 2

## 2017-01-17 MED ORDER — HYDROCODONE-ACETAMINOPHEN 5-325 MG PO TABS: 1.0000 | ORAL_TABLET | Freq: Four times a day (QID) | ORAL | 0 refills | Status: DC | PRN

## 2017-01-17 NOTE — ED Provider Notes (Signed)
[facility] Emergency Department Provider Note  ____________________________________________   First MD Initiated Contact with Patient 01/17/17 1552     (approximate)  I have reviewed the triage vital signs and the nursing notes.   HISTORY  Chief Complaint Abdominal Pain    HPI Shawna [name] is a 51 y.o. female who comes to the emergency department via EMS with 5 days of insidious onset gradually progressive moderate to severe aching cramping discomfort in her left lower quadrant, nausea, vomiting. She finally called 911 today because she has been unable to keep any solid food down secondary to nausea. She has a remote abdominal surgical history of hysterectomy. She is also had multiple colonoscopies concerning for IBS. Last year she had an endoscopy showing a bleeding ulcer. She does report 1-2 days of dark stools. She is passing hard stools as well as flatus.  11/27/15 endoscpy:  - LA Grade B erosive esophagitis. Biopsied. - Friable gastric ulcer with no visible vessel. - Non-bleeding gastric ulcer with no stigmata of bleeding. - Biopsies were taken with a cold forceps for histology and Helicobacter pylori testing.   Past Medical History:  Diagnosis Date  . Asthma   . Bulging lumbar disc   . Bulging lumbar disc   . GERD (gastroesophageal reflux disease)   . Insomnia   . Kidney stone   . Pneumonia DEC 2016  . Sciatic leg pain    Right side  . Sciatica of right side   . Weakness    Bil knees, and ankles    Patient Active Problem List   Diagnosis Date Noted  . Dyslipidemia 07/26/2016  . Low vitamin B12 level 07/26/2016  . Major depressive disorder, 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  . Overdose of sedative or hypnotic 07/23/2016  . COPD exacerbation (HCC) 07/23/2016  . GERD (gastroesophageal reflux disease) 07/23/2016  . Cannabis use disorder, moderate, 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  . Tobacco use disorder 07/23/2016    . Sedative, hypnotic or anxiolytic use disorder, severe, 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  . Bacterial pneumonia 01/23/2015  . Leukocytosis 01/23/2015  . Sepsis (HCC) 01/23/2015  . Hypoxemia 01/23/2015  . Opioid use disorder, moderate, dependence (HCC) 12/31/2014  . Anxiety disorder, unspecified 12/31/2014    Past Surgical History:  Procedure Laterality Date  . ABDOMINAL HYSTERECTOMY     Partial  . ESOPHAGOGASTRODUODENOSCOPY (EGD) WITH PROPOFOL N/A 11/27/2015   Procedure: ESOPHAGOGASTRODUODENOSCOPY (EGD) WITH PROPOFOL;  Surgeon: Christena Deem, MD;  Location: [facility] ENDOSCOPY;  Service: Endoscopy;  Laterality: N/A;  . SHOULDER SURGERY Right   . TONSILLECTOMY    . TUBAL LIGATION      Prior to Admission medications   Medication Sig Start Date End Date Taking? Authorizing Provider  acetaminophen (TYLENOL) 500 MG tablet Take 500 mg by mouth every 6 (six) hours as needed.    [provider]  albuterol (PROVENTIL HFA;VENTOLIN HFA) 108 (90 Base) MCG/ACT inhaler Inhale 2 puffs into the lungs every 6 (six) hours as needed for wheezing or shortness of breath. 01/16/16   Jennye Moccasin, MD  amoxicillin-clavulanate (AUGMENTIN) 875-125 MG tablet Take 1 tablet by mouth 2 (two) times daily. 01/17/17 01/31/17  Merrily Brittle, MD  dexlansoprazole (DEXILANT) 60 MG capsule Take 1 capsule by mouth daily.    [provider]  dicyclomine (BENTYL) 10 MG capsule Take 10 mg by mouth 4 (four) times daily -  before meals and at bedtime.    [provider]  DULoxetine 40 MG CPEP Take 40  mg by mouth daily. 07/27/16   Pucilowska, Jolanta B, MD  fexofenadine (ALLEGRA) 180 MG tablet Take 180 mg by mouth daily.    [provider]  fluticasone (FLONASE) 50 MCG/ACT nasal spray Place 1 spray into both nostrils daily.    [provider]  gemfibrozil (LOPID) 600 MG tablet Take 1 tablet (600 mg total) by mouth 2 (two) times daily before a meal. 07/26/16   Pucilowska, Jolanta B, MD   HYDROcodone-acetaminophen (NORCO) 5-325 MG tablet Take 1 tablet by mouth every 6 (six) hours as needed for severe pain. 01/17/17   Merrily Brittle, MD  hydrOXYzine (ATARAX/VISTARIL) 50 MG tablet Take 50 mg by mouth every 6 (six) hours as needed.    [provider]  lidocaine (XYLOCAINE) 5 % ointment Apply topically daily as needed (once daily for leg pain). 07/26/16   Pucilowska, Braulio Conte B, MD  loperamide (LOPERAMIDE A-D) 2 MG tablet Take 1 tablet (2 mg total) by mouth 4 (four) times daily as needed for diarrhea or loose stools. 07/07/16   Merrily Brittle, MD  metroNIDAZOLE (FLAGYL) 500 MG tablet Take 1 tablet (500 mg total) by mouth 3 (three) times daily. 01/17/17 01/31/17  Merrily Brittle, MD  ondansetron (ZOFRAN ODT) 4 MG disintegrating tablet Take 1 tablet (4 mg total) by mouth every 8 (eight) hours as needed for nausea or vomiting. 01/17/17   Merrily Brittle, MD  QUEtiapine (SEROQUEL) 100 MG tablet Take 1 tablet (100 mg total) by mouth at bedtime. 07/26/16   Pucilowska, Braulio Conte B, MD  QUEtiapine (SEROQUEL) 25 MG tablet Take 1 tablet (25 mg total) by mouth 3 (three) times daily as needed (anxiety). 07/26/16   Pucilowska, Jolanta B, MD  sucralfate (CARAFATE) 1 g tablet Take 1 g by mouth 4 (four) times daily -  with meals and at bedtime.    [provider]  vitamin B-12 1000 MCG tablet Take 1 tablet (1,000 mcg total) by mouth daily. 07/26/16   Pucilowska, Braulio Conte B, MD    Allergies Ciprofloxacin hcl; Fentanyl; Haldol [haloperidol]; Morphine and related; Propofol; and Vicodin [hydrocodone-acetaminophen]  Family History  Problem Relation Age of Onset  . Fibromyalgia Mother     Social History Social History  Substance Use Topics  . Smoking status: Current Every Day Smoker    Packs/day: 0.50    Years: 40.00    Types: Cigarettes  . Smokeless tobacco: Current User  . Alcohol use No    Review of Systems Constitutional: No fever/chills Eyes: No visual changes. ENT: No sore  throat. Cardiovascular: Denies chest pain. Respiratory: Denies shortness of breath. Gastrointestinal: positive abdominal pain.  Positive nausea, positivevomiting.  No diarrhea.  Positive constipation. Genitourinary: Negative for dysuria. Musculoskeletal: Negative for back pain. Skin: Negative for rash. Neurological: Negative for headaches, focal weakness or numbness.   ____________________________________________   PHYSICAL EXAM:  VITAL SIGNS: ED Triage Vitals [01/17/17 1535]  Enc Vitals Group     BP 129/81     Pulse Rate 82     Resp 16     Temp 98 F (36.7 C)     Temp Source Oral     SpO2 98 %     Weight 150 lb (68 kg)     Height  (1.651 m)     Head Circumference      Peak Flow      Pain Score 10     Pain Loc      Pain Edu?      Excl. in GC?  Constitutional: alert and oriented 4 appears uncomfortable nontoxic no diaphoresis speaks full clear sentences Eyes: PERRL EOMI. Head: Atraumatic. Nose: No congestion/rhinnorhea. Mouth/Throat: No trismus Neck: No stridor.   Cardiovascular: Normal rate, regular rhythm. Grossly normal heart sounds.  Good peripheral circulation. Respiratory: Normal respiratory effort.  No retractions. Lungs CTAB and moving good air Gastrointestinal: soft nondistended significant tenderness in the left lower quadrant with rebound and voluntary guarding. Negative Rovsing's. No McBurney's tenderness. Musculoskeletal: No lower extremity edema   Neurologic:  Normal speech and language. No gross focal neurologic deficits are appreciated. Skin:  Skin is warm, dry and intact. No rash noted. Psychiatric: Mood and affect are normal. Speech and behavior are normal.    ____________________________________________   DIFFERENTIAL includes but not limited to  diverticulitis, perforation, abscess, appendicitis, bowel obstruction, constipation ____________________________________________   LABS (all labs ordered are listed, but only abnormal  results are displayed)  Labs Reviewed  BASIC METABOLIC PANEL - Abnormal; Notable for the following:       Result Value   Glucose, Bld 121 (*)    BUN 5 (*)    All other components within normal limits  HEPATIC FUNCTION PANEL - Abnormal; Notable for the following:    ALT 12 (*)    Bilirubin, Direct <0.1 (*)    All other components within normal limits  URINALYSIS, COMPLETE (UACMP) WITH MICROSCOPIC - Abnormal; Notable for the following:    Color, Urine STRAW (*)    APPearance CLEAR (*)    Specific Gravity, Urine 1.002 (*)    Squamous Epithelial / LPF 0-5 (*)    All other components within normal limits  LIPASE, BLOOD  CBC WITH DIFFERENTIAL/PLATELET    Blood work reviewed and interpreted by me unremarkable no signs of urinary tract infection __________________________________________  EKG   ____________________________________________  RADIOLOGY  CT scan reviewed by me shows pancolitis ____________________________________________   PROCEDURES  Procedure(s) performed: no  Procedures  Critical Care performed: no  Observation: no ____________________________________________   INITIAL IMPRESSION / ASSESSMENT AND PLAN / ED COURSE  Pertinent labs & imaging results that were available during my care of the patient were reviewed by me and considered in my medical decision making (see chart for details).  the patient arrives with normal vital signs although with 5 days of progressive left lower quadrant pain and significant tenderness. At this point she does not have a surgical abdomen. I am concerned that she may very well have diverticulitis. IV morphine, Zofran, fluids, and CT scan with IV contrast are pending.     ----------------------------------------- 5:41 PM on 01/17/2017 -----------------------------------------  The patient's CT scan shows pancolitis without perforation or obstruction. I had a lengthy discussion with the patient regarding this diagnosis and  that this could either be a bacterial infection, viral infection, or an inflammatory etiology. I will treat her presumptively with Augmentin and Flagyl now for a bacterial source, however more importantly we will refer her back to Dr. Mechele Collin her gastroenterologist within this week for recheck. Strict return precautions given. ____________________________________________   FINAL CLINICAL IMPRESSION(S) / ED DIAGNOSES  Final diagnoses:  Colitis      NEW MEDICATIONS STARTED DURING THIS VISIT:  Discharge Medication List as of 01/17/2017  5:41 PM    START taking these medications   Details  amoxicillin-clavulanate (AUGMENTIN) 875-125 MG tablet Take 1 tablet by mouth 2 (two) times daily., Starting Sun 01/17/2017, Until Sun 01/31/2017, Print    HYDROcodone-acetaminophen (NORCO) 5-325 MG tablet Take 1 tablet by mouth every 6 (six) hours  as needed for severe pain., Starting Sun 01/17/2017, Print    metroNIDAZOLE (FLAGYL) 500 MG tablet Take 1 tablet (500 mg total) by mouth 3 (three) times daily., Starting Sun 01/17/2017, Until Sun 01/31/2017, Print         Note:  This document was prepared using Dragon voice recognition software and may include unintentional dictation errors.     Merrily Brittle, MD 01/18/17 1402

## 2017-01-17 NOTE — ED Notes (Signed)
ED Provider at bedside. 

## 2017-01-17 NOTE — ED Triage Notes (Signed)
Pt comes into the ED via [facility] from home c/o LLQ abdominal pain x 5 days.  Patient states she has had nausea, vomiting, and small bowel movements that have included dark blood in them.  Patient is alert and oriented x4 and ambulatory to bed from stretcher.  Patient in NAD at this time with even and unlabored respirations.  VS stable with EMS.  Patient tender upon abdominal palpation.

## 2017-01-17 NOTE — Discharge Instructions (Signed)
Please take all of your antibiotics as prescribed and make an appointment to follow-up with Dr. Mechele Collin your gastroenterologist within the next week for reevaluation.return to the emergency department sooner for any concerns such as fevers, chills, worsening pain, or for any other issues whatsoever.  It was a pleasure to take care of you today, and thank you for coming to our emergency department.  If you have any questions or concerns before leaving please ask the nurse to grab me and I'm more than happy to go through your aftercare instructions again.  If you were prescribed any opioid pain medication today such as Norco, Vicodin, Percocet, morphine, hydrocodone, or oxycodone please make sure you do not drive when you are taking this medication as it can alter your ability to drive safely.  If you have any concerns once you are home that you are not improving or are in fact getting worse before you can make it to your follow-up appointment, please do not hesitate to call [phone] and come back for further evaluation.  Merrily Brittle, MD  Results for orders placed or performed during the hospital encounter of 01/17/17  Basic metabolic panel  Result Value Ref Range   Sodium 141 135 - 145 mmol/L   Potassium 3.7 3.5 - 5.1 mmol/L   Chloride 109 101 - 111 mmol/L   CO2 22 22 - 32 mmol/L   Glucose, Bld 121 (H) 65 - 99 mg/dL   BUN 5 (L) 6 - 20 mg/dL   Creatinine, Ser 1.61 0.44 - 1.00 mg/dL   Calcium 9.2 8.9 - 09.6 mg/dL   GFR calc non Af Amer >60 >60 mL/min   GFR calc Af Amer >60 >60 mL/min   Anion gap 10 5 - 15  Hepatic function panel  Result Value Ref Range   Total Protein 7.2 6.5 - 8.1 g/dL   Albumin 4.2 3.5 - 5.0 g/dL   AST 22 15 - 41 U/L   ALT 12 (L) 14 - 54 U/L   Alkaline Phosphatase 69 38 - 126 U/L   Total Bilirubin 0.8 0.3 - 1.2 mg/dL   Bilirubin, Direct <0.4 (L) 0.1 - 0.5 mg/dL   Indirect Bilirubin NOT CALCULATED 0.3 - 0.9 mg/dL  Lipase, blood  Result Value Ref Range   Lipase 18 11 -  51 U/L  CBC with Differential  Result Value Ref Range   WBC 7.0 3.6 - 11.0 K/uL   RBC 4.36 3.80 - 5.20 MIL/uL   Hemoglobin 14.3 12.0 - 16.0 g/dL   HCT 54.0 98.1 - 19.1 %   MCV 93.3 80.0 - 100.0 fL   MCH 32.8 26.0 - 34.0 pg   MCHC 35.1 32.0 - 36.0 g/dL   RDW 47.8 29.5 - 62.1 %   Platelets 309 150 - 440 K/uL   Neutrophils Relative % 63 %   Neutro Abs 4.4 1.4 - 6.5 K/uL   Lymphocytes Relative 30 %   Lymphs Abs 2.1 1.0 - 3.6 K/uL   Monocytes Relative 3 %   Monocytes Absolute 0.2 0.2 - 0.9 K/uL   Eosinophils Relative 3 %   Eosinophils Absolute 0.2 0 - 0.7 K/uL   Basophils Relative 1 %   Basophils Absolute 0.1 0 - 0.1 K/uL  Urinalysis, Complete w Microscopic  Result Value Ref Range   Color, Urine STRAW (A) YELLOW   APPearance CLEAR (A) CLEAR   Specific Gravity, Urine 1.002 (L) 1.005 - 1.030   pH 7.0 5.0 - 8.0   Glucose, UA NEGATIVE NEGATIVE  mg/dL   Hgb urine dipstick NEGATIVE NEGATIVE   Bilirubin Urine NEGATIVE NEGATIVE   Ketones, ur NEGATIVE NEGATIVE mg/dL   Protein, ur NEGATIVE NEGATIVE mg/dL   Nitrite NEGATIVE NEGATIVE   Leukocytes, UA NEGATIVE NEGATIVE   RBC / HPF NONE SEEN 0 - 5 RBC/hpf   WBC, UA 0-5 0 - 5 WBC/hpf   Bacteria, UA NONE SEEN NONE SEEN   Squamous Epithelial / LPF 0-5 (A) NONE SEEN   Ct Abdomen Pelvis W Contrast  Result Date: 01/17/2017 CLINICAL DATA:  Nausea, vomiting, LEFT lower quadrant abdominal pain for 5 days. Blood in stool. History of hysterectomy, kidney stones. EXAM: CT ABDOMEN AND PELVIS WITH CONTRAST TECHNIQUE: Multidetector CT imaging of the abdomen and pelvis was performed using the standard protocol following bolus administration of intravenous contrast. CONTRAST:  ISOVUE-300 IOPAMIDOL (ISOVUE-300) INJECTION 61% COMPARISON:  CT abdomen and pelvis July 12, 2016 FINDINGS: LOWER CHEST: Bilateral lower lobe atelectasis/scarring. Included heart size is normal. No pericardial effusion. HEPATOBILIARY: Liver and gallbladder are normal. PANCREAS:  Normal. SPLEEN: Normal. ADRENALS/URINARY TRACT: Kidneys are orthotopic, demonstrating symmetric enhancement. 6 mm and 3 mm nonobstructing LEFT lower pole nephrolithiasis. Punctate RIGHT interpolar nonobstructing nephrolithiasis. 10 mm cyst lower pole RIGHT kidney. Too small to characterize hypodensity RIGHT interpolar kidney. No hydronephrosis or solid renal masses. The unopacified ureters are normal in course and caliber. Delayed imaging through the kidneys demonstrates symmetric prompt contrast excretion within the proximal urinary collecting system. Urinary bladder is partially distended and unremarkable. Normal adrenal glands. STOMACH/BOWEL: Mild diffuse colonic wall thickening and pericolonic fat stranding. The stomach, small bowel are normal in course and caliber without inflammatory changes. Normal appendix. VASCULAR/LYMPHATIC: Aortoiliac vessels are normal in course and caliber. Moderate intimal thickening and calcific atherosclerosis. No lymphadenopathy by CT size criteria. REPRODUCTIVE: Status post hysterectomy. OTHER: No intraperitoneal free fluid or free air. Phleboliths in the pelvis. MUSCULOSKELETAL: Bilateral femoral head irregular sclerosis with slight RIGHT collapse. Anterior abdominal wall scarring. IMPRESSION: 1. Mild pancolitis without complication. 2. Nonobstructing bilateral nephrolithiasis. 3. 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 Aortic Atherosclerosis (ICD10-I70.0). Electronically Signed   By: Awilda Metro M.D.   On: 01/17/2017 17:26

## 2017-01-22 ENCOUNTER — Inpatient Hospital Stay
Admission: EM | Admit: 2017-01-22 | Discharge: 2017-01-25 | DRG: 392 | Disposition: A | Discharge status: Home / Self Care | Payer: Medicaid Other | Attending: Internal Medicine | Admitting: Internal Medicine

## 2017-01-22 ENCOUNTER — Encounter: Payer: Self-pay | Admitting: Emergency Medicine

## 2017-01-22 DIAGNOSIS — Z885 Allergy status to narcotic agent status: Secondary | ICD-10-CM | POA: Diagnosis not present

## 2017-01-22 DIAGNOSIS — F419 Anxiety disorder, unspecified: Secondary | ICD-10-CM | POA: Diagnosis present

## 2017-01-22 DIAGNOSIS — K219 Gastro-esophageal reflux disease without esophagitis: Secondary | ICD-10-CM | POA: Diagnosis present

## 2017-01-22 DIAGNOSIS — F17210 Nicotine dependence, cigarettes, uncomplicated: Secondary | ICD-10-CM | POA: Diagnosis present

## 2017-01-22 DIAGNOSIS — Z79899 Other long term (current) drug therapy: Secondary | ICD-10-CM

## 2017-01-22 DIAGNOSIS — E876 Hypokalemia: Secondary | ICD-10-CM | POA: Diagnosis present

## 2017-01-22 DIAGNOSIS — Z881 Allergy status to other antibiotic agents status: Secondary | ICD-10-CM | POA: Diagnosis not present

## 2017-01-22 DIAGNOSIS — Z90710 Acquired absence of both cervix and uterus: Secondary | ICD-10-CM | POA: Diagnosis not present

## 2017-01-22 DIAGNOSIS — K529 Noninfective gastroenteritis and colitis, unspecified: Principal | ICD-10-CM | POA: Diagnosis present

## 2017-01-22 DIAGNOSIS — Z7951 Long term (current) use of inhaled steroids: Secondary | ICD-10-CM | POA: Diagnosis not present

## 2017-01-22 DIAGNOSIS — E440 Moderate protein-calorie malnutrition: Secondary | ICD-10-CM | POA: Diagnosis present

## 2017-01-22 DIAGNOSIS — Z888 Allergy status to other drugs, medicaments and biological substances status: Secondary | ICD-10-CM | POA: Diagnosis not present

## 2017-01-22 DIAGNOSIS — K589 Irritable bowel syndrome without diarrhea: Secondary | ICD-10-CM | POA: Diagnosis present

## 2017-01-22 DIAGNOSIS — Z6823 Body mass index (BMI) 23.0-23.9, adult: Secondary | ICD-10-CM | POA: Diagnosis not present

## 2017-01-22 LAB — URINALYSIS, COMPLETE (UACMP) WITH MICROSCOPIC
Bacteria, UA: NONE SEEN
Bilirubin Urine: NEGATIVE
Glucose, UA: NEGATIVE mg/dL
Hgb urine dipstick: NEGATIVE
KETONES UR: 5 mg/dL — AB
Nitrite: NEGATIVE
PH: 5 (ref 5.0–8.0)
Protein, ur: 30 mg/dL — AB
SPECIFIC GRAVITY, URINE: 1.023 (ref 1.005–1.030)

## 2017-01-22 LAB — CBC
HCT: 40 % (ref 35.0–47.0)
Hemoglobin: 13.9 g/dL (ref 12.0–16.0)
MCH: 32.6 pg (ref 26.0–34.0)
MCHC: 34.9 g/dL (ref 32.0–36.0)
MCV: 93.5 fL (ref 80.0–100.0)
PLATELETS: 299 10*3/uL (ref 150–440)
RBC: 4.27 MIL/uL (ref 3.80–5.20)
RDW: 13.4 % (ref 11.5–14.5)
WBC: 10 10*3/uL (ref 3.6–11.0)

## 2017-01-22 LAB — COMPREHENSIVE METABOLIC PANEL
ALT: 16 U/L (ref 14–54)
AST: 27 U/L (ref 15–41)
Albumin: 3.9 g/dL (ref 3.5–5.0)
Alkaline Phosphatase: 66 U/L (ref 38–126)
Anion gap: 10 (ref 5–15)
BILIRUBIN TOTAL: 0.8 mg/dL (ref 0.3–1.2)
BUN: 5 mg/dL — AB (ref 6–20)
CALCIUM: 9.1 mg/dL (ref 8.9–10.3)
CO2: 23 mmol/L (ref 22–32)
CREATININE: 0.77 mg/dL (ref 0.44–1.00)
Chloride: 106 mmol/L (ref 101–111)
GFR calc Af Amer: >60 mL/min (ref >60)
Glucose, Bld: 143 mg/dL — ABNORMAL HIGH (ref 65–99)
Potassium: 2.9 mmol/L — ABNORMAL LOW (ref 3.5–5.1)
Sodium: 139 mmol/L (ref 135–145)
TOTAL PROTEIN: 6.9 g/dL (ref 6.5–8.1)

## 2017-01-22 LAB — LIPASE, BLOOD: Lipase: 17 U/L (ref 11–51)

## 2017-01-22 LAB — MAGNESIUM: MAGNESIUM: 1.6 mg/dL — AB (ref 1.7–2.4)

## 2017-01-22 MED ORDER — POTASSIUM CHLORIDE 10 MEQ/100ML IV SOLN
10.0000 meq | Freq: Once | INTRAVENOUS | Status: AC
  Administered 2017-01-22: 10 meq via INTRAVENOUS
  Filled 2017-01-22: qty 100

## 2017-01-22 MED ORDER — ALBUTEROL SULFATE (2.5 MG/3ML) 0.083% IN NEBU: 3.0000 mL | INHALATION_SOLUTION | Freq: Four times a day (QID) | RESPIRATORY_TRACT | Status: DC | PRN

## 2017-01-22 MED ORDER — ACETAMINOPHEN 325 MG PO TABS: 650.0000 mg | ORAL_TABLET | Freq: Four times a day (QID) | ORAL | Status: DC | PRN

## 2017-01-22 MED ORDER — SODIUM CHLORIDE 0.9 % IV SOLN
INTRAVENOUS | Status: DC
  Administered 2017-01-22 – 2017-01-23 (×2): via INTRAVENOUS

## 2017-01-22 MED ORDER — HYDROCODONE-ACETAMINOPHEN 5-325 MG PO TABS
1.0000 | ORAL_TABLET | ORAL | Status: DC | PRN
Start: 2017-01-22 — End: 2017-01-25
  Administered 2017-01-22 – 2017-01-25 (×9): 1 via ORAL
  Filled 2017-01-22 (×9): qty 1

## 2017-01-22 MED ORDER — PIPERACILLIN-TAZOBACTAM 3.375 G IVPB: 3.3750 g | Freq: Three times a day (TID) | INTRAVENOUS | Status: DC

## 2017-01-22 MED ORDER — DICYCLOMINE HCL 10 MG PO CAPS: 10.0000 mg | ORAL_CAPSULE | Freq: Three times a day (TID) | ORAL | Status: DC | PRN

## 2017-01-22 MED ORDER — ONDANSETRON HCL 4 MG PO TABS
4.0000 mg | ORAL_TABLET | Freq: Four times a day (QID) | ORAL | Status: DC | PRN
  Administered 2017-01-22 – 2017-01-23 (×4): 4 mg via ORAL
  Filled 2017-01-22 (×4): qty 1

## 2017-01-22 MED ORDER — ACETAMINOPHEN 650 MG RE SUPP
650.0000 mg | Freq: Four times a day (QID) | RECTAL | Status: DC | PRN
Start: 2017-01-22 — End: 2017-01-25

## 2017-01-22 MED ORDER — HYDROXYZINE HCL 25 MG PO TABS
50.0000 mg | ORAL_TABLET | Freq: Three times a day (TID) | ORAL | Status: DC | PRN
Start: 2017-01-22 — End: 2017-01-24

## 2017-01-22 MED ORDER — PIPERACILLIN-TAZOBACTAM 4.5 G IVPB
3.3750 g | Freq: Once | INTRAVENOUS | Status: DC
  Filled 2017-01-22: qty 100

## 2017-01-22 MED ORDER — ZOLPIDEM TARTRATE 5 MG PO TABS
10.0000 mg | ORAL_TABLET | Freq: Every evening | ORAL | Status: DC | PRN
  Administered 2017-01-22 – 2017-01-24 (×3): 10 mg via ORAL
  Filled 2017-01-22 (×3): qty 2

## 2017-01-22 MED ORDER — METRONIDAZOLE IN NACL 5-0.79 MG/ML-% IV SOLN
500.0000 mg | Freq: Once | INTRAVENOUS | Status: AC
  Administered 2017-01-22: 500 mg via INTRAVENOUS
  Filled 2017-01-22: qty 100

## 2017-01-22 MED ORDER — SODIUM CHLORIDE 0.9 % IV BOLUS (SEPSIS)
1000.0000 mL | Freq: Once | INTRAVENOUS | Status: AC
  Administered 2017-01-22: 1000 mL via INTRAVENOUS

## 2017-01-22 MED ORDER — MESALAMINE 400 MG PO CPDR
800.0000 mg | DELAYED_RELEASE_CAPSULE | Freq: Once | ORAL | Status: AC
  Administered 2017-01-22: 800 mg via ORAL
  Filled 2017-01-22: qty 2

## 2017-01-22 MED ORDER — FLUTICASONE PROPIONATE 50 MCG/ACT NA SUSP
2.0000 | Freq: Every day | NASAL | Status: DC
  Administered 2017-01-23: 2 via NASAL
  Filled 2017-01-22: qty 16

## 2017-01-22 MED ORDER — PANTOPRAZOLE SODIUM 40 MG PO TBEC
40.0000 mg | DELAYED_RELEASE_TABLET | Freq: Every day | ORAL | Status: DC
Start: 2017-01-22 — End: 2017-01-25
  Administered 2017-01-22 – 2017-01-25 (×4): 40 mg via ORAL
  Filled 2017-01-22 (×4): qty 1

## 2017-01-22 MED ORDER — POTASSIUM CHLORIDE CRYS ER 20 MEQ PO TBCR
20.0000 meq | EXTENDED_RELEASE_TABLET | Freq: Two times a day (BID) | ORAL | Status: AC
  Administered 2017-01-22 – 2017-01-24 (×4): 20 meq via ORAL
  Filled 2017-01-22 (×4): qty 1

## 2017-01-22 MED ORDER — PIPERACILLIN-TAZOBACTAM 3.375 G IVPB
3.3750 g | Freq: Three times a day (TID) | INTRAVENOUS | Status: DC
  Administered 2017-01-22 – 2017-01-25 (×8): 3.375 g via INTRAVENOUS
  Filled 2017-01-22 (×8): qty 50

## 2017-01-22 MED ORDER — CLONAZEPAM 0.5 MG PO TABS
0.5000 mg | ORAL_TABLET | Freq: Two times a day (BID) | ORAL | Status: DC | PRN
  Administered 2017-01-22 – 2017-01-24 (×5): 0.5 mg via ORAL
  Filled 2017-01-22 (×6): qty 1

## 2017-01-22 MED ORDER — ENOXAPARIN SODIUM 40 MG/0.4ML ~~LOC~~ SOLN
40.0000 mg | SUBCUTANEOUS | Status: DC
  Administered 2017-01-22 – 2017-01-24 (×3): 40 mg via SUBCUTANEOUS
  Filled 2017-01-22 (×3): qty 0.4

## 2017-01-22 MED ORDER — ONDANSETRON HCL 4 MG/2ML IJ SOLN
4.0000 mg | Freq: Four times a day (QID) | INTRAMUSCULAR | Status: DC | PRN
  Administered 2017-01-23 – 2017-01-25 (×5): 4 mg via INTRAVENOUS
  Filled 2017-01-22 (×5): qty 2

## 2017-01-22 MED ORDER — NICOTINE 21 MG/24HR TD PT24
21.0000 mg | MEDICATED_PATCH | Freq: Every day | TRANSDERMAL | Status: DC
  Administered 2017-01-22 – 2017-01-25 (×4): 21 mg via TRANSDERMAL
  Filled 2017-01-22 (×4): qty 1

## 2017-01-22 NOTE — ED Notes (Signed)
Labs drawn and sent, urine collected.

## 2017-01-22 NOTE — Progress Notes (Signed)
Pharmacy Antibiotic Note  Shawna [name] is a 51 y.o. female admitted on 01/22/2017 with IAI.  Pharmacy has been consulted for Zosyn dosing.  Plan: Zosyn 3.375 gm IV Q8H EI  Height:  (167.6 cm) Weight: 148 lb (67.1 kg) IBW/kg (Calculated) : 59.3  Temp (24hrs), Avg:98.3 F (36.8 C), Min:97.6 F (36.4 C), Max:99 F (37.2 C)   Recent Labs Lab 01/17/17 1551 01/22/17 1107  WBC 7.0 10.0  CREATININE 0.71 0.77    Estimated Creatinine Clearance: 79.6 mL/min (by C-G formula based on SCr of 0.77 mg/dL).    Allergies  Allergen Reactions  . Ciprofloxacin Hcl Nausea Only  . Fentanyl Nausea And Vomiting and Other (See Comments)    Patient states this makes her have severe flu symptoms.  . Haldol [Haloperidol]     Made pt "feel really weird".  . Morphine And Related Nausea And Vomiting    Patient states this medication makes her have sever flu symptoms.  . Propofol   . Vicodin [Hydrocodone-Acetaminophen] Nausea And Vomiting    Thank you for allowing pharmacy to be a part of this patient's care.  Carola Frost, Pharm.D., BCPS Clinical Pharmacist 01/22/2017 5:24 PM

## 2017-01-22 NOTE — ED Triage Notes (Signed)
Pt reports abdominal pain and nausea for 1 week. Pt reports was seen here a week ago for the same. Followed up with her MD today because no better and was advised to come to the ED. Pt c.o lower abd cramping and nausea. Denies diarrhea or vomitting. Pt also with a low grade fever.

## 2017-01-22 NOTE — Progress Notes (Signed)
Prime doctor was notified that pt has had frequent bowel movements, new orders to place pt on Cdiff precautions to rule out. Pt was educated on the precautions and the importance of stool collection. Pt verbalized understanding.   Merion Caton [employer]

## 2017-01-22 NOTE — H&P (Signed)
[facility] - [place] at [facility]   PATIENT NAME: Shawna [name]    MR#:  [id number]  DATE OF BIRTH:  1966/03/12  DATE OF ADMISSION:  01/22/2017  PRIMARY CARE PHYSICIAN: Sherron Monday, MD   REQUESTING/REFERRING PHYSICIAN: Dr. Chaney Malling.   CHIEF COMPLAINT:   Chief Complaint  Patient presents with  . Abdominal Pain    HISTORY OF PRESENT ILLNESS:  Shawna [name]  is a 51 y.o. female with a known history of Asthma, GERD, previous history of sciatica, irritable bowel syndrome presents to the hospital due to abdominal pain. Patient says she developed abdominal pain about 5 days ago and presented to the ER couple days back and underwent a CT scan which showed colitis. She was placed on oral Flagyl and Augmentin and discharged home. Despite taking any antibiotics patient's pain has continued to persist associated with some nausea and some intermittent vomiting. She denies any fever but admits to some chills. Given her progressive symptoms she came to the ER for further evaluation. Hospitalist services were consulted for treatment and evaluation.  PAST MEDICAL HISTORY:   Past Medical History:  Diagnosis Date  . Asthma   . Bulging lumbar disc   . Bulging lumbar disc   . GERD (gastroesophageal reflux disease)   . Insomnia   . Kidney stone   . Pneumonia DEC 2016  . Sciatic leg pain    Right side  . Sciatica of right side   . Weakness    Bil knees, and ankles    PAST SURGICAL HISTORY:   Past Surgical History:  Procedure Laterality Date  . ABDOMINAL HYSTERECTOMY     Partial  . ESOPHAGOGASTRODUODENOSCOPY (EGD) WITH PROPOFOL N/A 11/27/2015   Procedure: ESOPHAGOGASTRODUODENOSCOPY (EGD) WITH PROPOFOL;  Surgeon: Christena Deem, MD;  Location[facility] ENDOSCOPY;  Service: Endoscopy;  Laterality: N/A;  . SHOULDER SURGERY Right   . TONSILLECTOMY    . TUBAL LIGATION      SOCIAL HISTORY:   Social History  Substance Use Topics  . Smoking status: Current Every Day Smoker     Packs/day: 0.50    Years: 40.00    Types: Cigarettes  . Smokeless tobacco: Current User  . Alcohol use No    FAMILY HISTORY:   Family History  Problem Relation Age of Onset  . Fibromyalgia Mother     DRUG ALLERGIES:   Allergies  Allergen Reactions  . Ciprofloxacin Hcl Nausea Only  . Fentanyl Nausea And Vomiting and Other (See Comments)    Patient states this makes her have severe flu symptoms.  . Haldol [Haloperidol]     Made pt "feel really weird".  . Morphine And Related Nausea And Vomiting    Patient states this medication makes her have sever flu symptoms.  . Propofol   . Vicodin [Hydrocodone-Acetaminophen] Nausea And Vomiting    REVIEW OF SYSTEMS:   Review of Systems  Constitutional: Negative for fever and weight loss.  HENT: Negative for congestion, nosebleeds and tinnitus.   Eyes: Negative for blurred vision, double vision and redness.  Respiratory: Negative for cough, hemoptysis and shortness of breath.   Cardiovascular: Negative for chest pain, orthopnea, leg swelling and PND.  Gastrointestinal: Positive for abdominal pain, diarrhea and nausea. Negative for melena and vomiting.  Genitourinary: Negative for dysuria, hematuria and urgency.  Musculoskeletal: Negative for falls and joint pain.  Neurological: Negative for dizziness, tingling, sensory change, focal weakness, seizures, weakness and headaches.  Endo/Heme/Allergies: Negative for polydipsia. Does not bruise/bleed easily.  Psychiatric/Behavioral: Negative  for depression and memory loss. The patient is not nervous/anxious.     MEDICATIONS AT HOME:   Prior to Admission medications   Medication Sig Start Date End Date Taking? Authorizing Provider  clonazePAM (KLONOPIN) 0.5 MG tablet Take 0.5 mg by mouth 2 (two) times daily as needed for anxiety.   Yes [provider]  dexlansoprazole (DEXILANT) 60 MG capsule Take 1 capsule by mouth daily.   Yes [provider]  fluticasone (FLONASE)  50 MCG/ACT nasal spray Place 2 sprays into both nostrils daily.    Yes [provider]  acetaminophen (TYLENOL) 500 MG tablet Take 500 mg by mouth every 6 (six) hours as needed.    [provider]  albuterol (PROVENTIL HFA;VENTOLIN HFA) 108 (90 Base) MCG/ACT inhaler Inhale 2 puffs into the lungs every 6 (six) hours as needed for wheezing or shortness of breath. 01/16/16   Jennye Moccasin, MD  amoxicillin-clavulanate (AUGMENTIN) 875-125 MG tablet Take 1 tablet by mouth 2 (two) times daily. Patient not taking: Reported on 01/22/2017 01/17/17 01/31/17  Merrily Brittle, MD  dicyclomine (BENTYL) 10 MG capsule Take 10 mg by mouth 3 (three) times daily as needed.     [provider]  DULoxetine 40 MG CPEP Take 40 mg by mouth daily. Patient not taking: Reported on 01/22/2017 07/27/16   Pucilowska, Braulio Conte B, MD  gemfibrozil (LOPID) 600 MG tablet Take 1 tablet (600 mg total) by mouth 2 (two) times daily before a meal. Patient not taking: Reported on 01/22/2017 07/26/16   Pucilowska, Ellin Goodie, MD  HYDROcodone-acetaminophen (NORCO) 5-325 MG tablet Take 1 tablet by mouth every 6 (six) hours as needed for severe pain. Patient not taking: Reported on 01/22/2017 01/17/17   Merrily Brittle, MD  hydrOXYzine (ATARAX/VISTARIL) 50 MG tablet Take 50 mg by mouth 3 (three) times daily as needed.     [provider]  lidocaine (XYLOCAINE) 5 % ointment Apply topically daily as needed (once daily for leg pain). Patient not taking: Reported on 01/22/2017 07/26/16   Pucilowska, Braulio Conte B, MD  loperamide (LOPERAMIDE A-D) 2 MG tablet Take 1 tablet (2 mg total) by mouth 4 (four) times daily as needed for diarrhea or loose stools. Patient not taking: Reported on 01/22/2017 07/07/16   Merrily Brittle, MD  metroNIDAZOLE (FLAGYL) 500 MG tablet Take 1 tablet (500 mg total) by mouth 3 (three) times daily. Patient not taking: Reported on 01/22/2017 01/17/17 01/31/17  Merrily Brittle, MD  ondansetron (ZOFRAN ODT) 4 MG  disintegrating tablet Take 1 tablet (4 mg total) by mouth every 8 (eight) hours as needed for nausea or vomiting. 01/17/17   Merrily Brittle, MD  QUEtiapine (SEROQUEL) 100 MG tablet Take 1 tablet (100 mg total) by mouth at bedtime. Patient not taking: Reported on 01/22/2017 07/26/16   Pucilowska, Ellin Goodie, MD  QUEtiapine (SEROQUEL) 25 MG tablet Take 1 tablet (25 mg total) by mouth 3 (three) times daily as needed (anxiety). Patient not taking: Reported on 01/22/2017 07/26/16   Pucilowska, Ellin Goodie, MD  vitamin B-12 1000 MCG tablet Take 1 tablet (1,000 mcg total) by mouth daily. Patient not taking: Reported on 01/22/2017 07/26/16   Pucilowska, Braulio Conte B, MD  zolpidem (AMBIEN) 10 MG tablet Take 10 mg by mouth at bedtime as needed for sleep.    [provider]      VITAL SIGNS:  Blood pressure 95/67, pulse 71, temperature 99 F (37.2 C), temperature source Oral, resp. rate 20, height  (1.676 m), weight 67.1 kg (148 lb),  SpO2 94 %.  PHYSICAL EXAMINATION:  Physical Exam  GENERAL:  51 y.o.-year-old patient lying in the bed in no acute distress.  EYES: Pupils equal, round, reactive to light and accommodation. No scleral icterus. Extraocular muscles intact.  HEENT: Head atraumatic, normocephalic. Oropharynx and nasopharynx clear. No oropharyngeal erythema, moist oral mucosa  NECK:  Supple, no jugular venous distention. No thyroid enlargement, no tenderness.  LUNGS: Normal breath sounds bilaterally, no wheezing, rales, rhonchi. No use of accessory muscles of respiration.  CARDIOVASCULAR: S1, S2 RRR. No murmurs, rubs, gallops, clicks.  ABDOMEN: Soft, tender in lower abdomen, no rebound, rigidity, nondistended. Bowel sounds present. No organomegaly or mass.  EXTREMITIES: No pedal edema, cyanosis, or clubbing. + 2 pedal & radial pulses b/l.   NEUROLOGIC: Cranial nerves II through XII are intact. No focal Motor or sensory deficits appreciated b/l PSYCHIATRIC: The patient is alert and oriented x 3.   SKIN: No obvious rash, lesion, or ulcer.   LABORATORY PANEL:   CBC  Recent Labs Lab 01/22/17 1107  WBC 10.0  HGB 13.9  HCT 40.0  PLT 299   ------------------------------------------------------------------------------------------------------------------  Chemistries   Recent Labs Lab 01/22/17 1107  NA 139  K 2.9*  CL 106  CO2 23  GLUCOSE 143*  BUN 5*  CREATININE 0.77  CALCIUM 9.1  AST 27  ALT 16  ALKPHOS 66  BILITOT 0.8   ------------------------------------------------------------------------------------------------------------------  Cardiac Enzymes No results for input(s): TROPONINI in the last 168 hours. ------------------------------------------------------------------------------------------------------------------  RADIOLOGY:  No results found.   IMPRESSION AND PLAN:   51 year old female with past medical history of GERD, irritable bowel syndrome, asthma who presents to the hospital due to abdominal pain. CT scan findings suggestive of colitis.  1. Colitis-this to cause of patient's abdominal pain nausea vomiting. Unclear this is inflammatory versus infectious. Patient has failed outpatient oral antibiotic therapy. -We'll admit the patient to the hospital, started on IV fluids, antiemetics, clear liquid diet. Place her empirically on IV Zosyn. Follow clinically. If not improving would consider getting a gastroenterology consult. -Patient has recently been on laxatives therefore will not test for C. difficile right now. If she continues to have diarrhea with check a gastrointestinal PCR and also C. difficile PCR.  2. Hypokalemia-secondary to the ongoing diarrhea, we'll replace intravenously and orally and follow in the morning. Check a magnesium level.  3. History of irritable bowel syndrome-continue Bentyl.  4. GERD-continue Protonix.  5. Anxiety-continue Klonopin as needed.    All the records are reviewed and case discussed with ED  provider. Management plans discussed with the patient, family and they are in agreement.  CODE STATUS: Full code  TOTAL TIME TAKING CARE OF THIS PATIENT: 45 minutes.    Houston Siren M.D on 01/22/2017 at 3:58 PM  Between 7am to 6pm - Pager - [phone]  After 6pm go to [email or web address] - password [email or web address] [facility]  [facility]  Office  [phone]  CC: Primary care physician; Sherron Monday, MD

## 2017-01-22 NOTE — ED Provider Notes (Signed)
[facility] Emergency Department Provider Note ____________________________________________   First MD Initiated Contact with Patient 01/22/17 1416     (approximate)  I have reviewed the triage vital signs and the nursing notes.   HISTORY  Chief Complaint Abdominal Pain    HPI Shawna [name] is a 51 y.o. female with past medical history as below who presents with left lower quadrant abdominal pain for approximately the last week, persistent course, associated with nausea as well as initially associated with diarrhea although patient states that this is improved after she started taking Imodium. Patient denies fever. She reports decreased by mouth intake and inability to eat, but states she has been able to take her antibiotics.  She was seen in the ED on 923 and diagnosed with colitis, and sent home with antibiotics but states that she is not improving, and is feeling increasingly weak.   Past Medical History:  Diagnosis Date  . Asthma   . Bulging lumbar disc   . Bulging lumbar disc   . GERD (gastroesophageal reflux disease)   . Insomnia   . Kidney stone   . Pneumonia DEC 2016  . Sciatic leg pain    Right side  . Sciatica of right side   . Weakness    Bil knees, and ankles    Patient Active Problem List   Diagnosis Date Noted  . Colitis 01/22/2017  . Dyslipidemia 07/26/2016  . Low vitamin B12 level 07/26/2016  . Major depressive disorder, 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  . Overdose of sedative or hypnotic 07/23/2016  . COPD exacerbation (HCC) 07/23/2016  . GERD (gastroesophageal reflux disease) 07/23/2016  . Cannabis use disorder, moderate, 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  . Tobacco use disorder 07/23/2016  . Sedative, hypnotic or anxiolytic use disorder, severe, 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  . Bacterial pneumonia 01/23/2015  . Leukocytosis 01/23/2015  . Sepsis (HCC) 01/23/2015  . Hypoxemia 01/23/2015  . Opioid use  disorder, moderate, dependence (HCC) 12/31/2014  . Anxiety disorder, unspecified 12/31/2014    Past Surgical History:  Procedure Laterality Date  . ABDOMINAL HYSTERECTOMY     Partial  . ESOPHAGOGASTRODUODENOSCOPY (EGD) WITH PROPOFOL N/A 11/27/2015   Procedure: ESOPHAGOGASTRODUODENOSCOPY (EGD) WITH PROPOFOL;  Surgeon: Christena Deem, MD;  Location: [facility] ENDOSCOPY;  Service: Endoscopy;  Laterality: N/A;  . SHOULDER SURGERY Right   . TONSILLECTOMY    . TUBAL LIGATION      Prior to Admission medications   Medication Sig Start Date End Date Taking? Authorizing Provider  clonazePAM (KLONOPIN) 0.5 MG tablet Take 0.5 mg by mouth 2 (two) times daily as needed for anxiety.   Yes [provider]  dexlansoprazole (DEXILANT) 60 MG capsule Take 1 capsule by mouth daily.   Yes [provider]  fluticasone (FLONASE) 50 MCG/ACT nasal spray Place 2 sprays into both nostrils daily.    Yes [provider]  acetaminophen (TYLENOL) 500 MG tablet Take 500 mg by mouth every 6 (six) hours as needed.    [provider]  albuterol (PROVENTIL HFA;VENTOLIN HFA) 108 (90 Base) MCG/ACT inhaler Inhale 2 puffs into the lungs every 6 (six) hours as needed for wheezing or shortness of breath. 01/16/16   Jennye Moccasin, MD  amoxicillin-clavulanate (AUGMENTIN) 875-125 MG tablet Take 1 tablet by mouth 2 (two) times daily. Patient not taking: Reported on 01/22/2017 01/17/17 01/31/17  Merrily Brittle, MD  dicyclomine (BENTYL) 10 MG capsule Take 10 mg by mouth 3 (three) times daily as needed.  [provider]  DULoxetine 40 MG CPEP Take 40 mg by mouth daily. Patient not taking: Reported on 01/22/2017 07/27/16   Pucilowska, Braulio Conte B, MD  gemfibrozil (LOPID) 600 MG tablet Take 1 tablet (600 mg total) by mouth 2 (two) times daily before a meal. Patient not taking: Reported on 01/22/2017 07/26/16   Pucilowska, Ellin Goodie, MD  HYDROcodone-acetaminophen (NORCO) 5-325 MG tablet Take 1 tablet by  mouth every 6 (six) hours as needed for severe pain. Patient not taking: Reported on 01/22/2017 01/17/17   Merrily Brittle, MD  hydrOXYzine (ATARAX/VISTARIL) 50 MG tablet Take 50 mg by mouth 3 (three) times daily as needed.     [provider]  lidocaine (XYLOCAINE) 5 % ointment Apply topically daily as needed (once daily for leg pain). Patient not taking: Reported on 01/22/2017 07/26/16   Pucilowska, Braulio Conte B, MD  loperamide (LOPERAMIDE A-D) 2 MG tablet Take 1 tablet (2 mg total) by mouth 4 (four) times daily as needed for diarrhea or loose stools. Patient not taking: Reported on 01/22/2017 07/07/16   Merrily Brittle, MD  metroNIDAZOLE (FLAGYL) 500 MG tablet Take 1 tablet (500 mg total) by mouth 3 (three) times daily. Patient not taking: Reported on 01/22/2017 01/17/17 01/31/17  Merrily Brittle, MD  ondansetron (ZOFRAN ODT) 4 MG disintegrating tablet Take 1 tablet (4 mg total) by mouth every 8 (eight) hours as needed for nausea or vomiting. 01/17/17   Merrily Brittle, MD  QUEtiapine (SEROQUEL) 100 MG tablet Take 1 tablet (100 mg total) by mouth at bedtime. Patient not taking: Reported on 01/22/2017 07/26/16   Pucilowska, Ellin Goodie, MD  QUEtiapine (SEROQUEL) 25 MG tablet Take 1 tablet (25 mg total) by mouth 3 (three) times daily as needed (anxiety). Patient not taking: Reported on 01/22/2017 07/26/16   Pucilowska, Ellin Goodie, MD  vitamin B-12 1000 MCG tablet Take 1 tablet (1,000 mcg total) by mouth daily. Patient not taking: Reported on 01/22/2017 07/26/16   Pucilowska, Braulio Conte B, MD  zolpidem (AMBIEN) 10 MG tablet Take 10 mg by mouth at bedtime as needed for sleep.    [provider]    Allergies Ciprofloxacin hcl; Fentanyl; Haldol [haloperidol]; Morphine and related; Propofol; and Vicodin [hydrocodone-acetaminophen]  Family History  Problem Relation Age of Onset  . Fibromyalgia Mother     Social History Social History  Substance Use Topics  . Smoking status: Current Every Day Smoker     Packs/day: 0.50    Years: 40.00    Types: Cigarettes  . Smokeless tobacco: Current User  . Alcohol use No    Review of Systems  Constitutional: No fever. Positive for general weakness.  Eyes: No redness.  ENT: No sore throat. Cardiovascular: Denies chest pain. Respiratory: Denies shortness of breath. Gastrointestinal: Positive for nausea.  Genitourinary: Negative for dysuria.  Musculoskeletal: Negative for back pain. Skin: Negative for rash. Neurological: Negative for headache.   ____________________________________________   PHYSICAL EXAM:  VITAL SIGNS: ED Triage Vitals  Enc Vitals Group     BP 01/22/17 1109 115/82     Pulse Rate 01/22/17 1109 (!) 113     Resp 01/22/17 1109 20     Temp 01/22/17 1109 99 F (37.2 C)     Temp Source 01/22/17 1109 Oral     SpO2 01/22/17 1109 96 %     Weight 01/22/17 1109 148 lb (67.1 kg)     Height 01/22/17 1109  (1.676 m)     Head Circumference --      Peak Flow --  Pain Score 01/22/17 1108 10     Pain Loc --      Pain Edu? --      Excl. in GC? --     Constitutional: Alert and oriented. Relatively well appearing and in no acute distress. Eyes: Conjunctivae are normal.  Head: Atraumatic. Nose: No congestion/rhinnorhea. Mouth/Throat: Mucous membranes are slightly dry.  Neck: Normal range of motion.  Cardiovascular: Normal rate, regular rhythm. Grossly normal heart sounds.  Good peripheral circulation. Respiratory: Normal respiratory effort.  No retractions. Lungs CTAB. Gastrointestinal: Moderate LLQ tenderness.  No distention.  Genitourinary: No CVA tenderness. Musculoskeletal: No lower extremity edema.  Extremities warm and well perfused.  Neurologic:  Normal speech and language. No gross focal neurologic deficits are appreciated.  Skin:  Skin is warm and dry. No rash noted. Psychiatric: Mood and affect are normal. Speech and behavior are normal.  ____________________________________________   LABS (all labs  ordered are listed, but only abnormal results are displayed)  Labs Reviewed  COMPREHENSIVE METABOLIC PANEL - Abnormal; Notable for the following:       Result Value   Potassium 2.9 (*)    Glucose, Bld 143 (*)    BUN 5 (*)    All other components within normal limits  URINALYSIS, COMPLETE (UACMP) WITH MICROSCOPIC - Abnormal; Notable for the following:    Color, Urine AMBER (*)    APPearance CLEAR (*)    Ketones, ur 5 (*)    Protein, ur 30 (*)    Leukocytes, UA SMALL (*)    Squamous Epithelial / LPF 6-30 (*)    All other components within normal limits  MAGNESIUM - Abnormal; Notable for the following:    Magnesium 1.6 (*)    All other components within normal limits  LIPASE, BLOOD  CBC   ____________________________________________  EKG   ____________________________________________  RADIOLOGY    ____________________________________________   PROCEDURES  Procedure(s) performed: No    Critical Care performed: No ____________________________________________   INITIAL IMPRESSION / ASSESSMENT AND PLAN / ED COURSE  Pertinent labs & imaging results that were available during my care of the patient were reviewed by me and considered in my medical decision making (see chart for details).  51 year old female with past medical history as noted presents with persistent left lower quadrant abdominal pain, decreased by mouth intake, and generalized weakness for the last week after she was diagnosed with pancolitis several days ago and treated with oral antibiotics. Patient states his symptoms have not improved at all and having fact worsened over the time that she has been on antibiotic. Vital signs are normal except for borderline temperature, patient is relatively well-appearing and exam is as described. Overall presentation is consistent with persistent colitis; there is no indication for repeat imaging. However it appears the patient is not having adequate improvement with  oral antibiotics and may be somewhat dehydrated due to the decreased by mouth intake.  Lab workup in the ED also reveals low potassium. Plan for admission for fluids, pain control, and IV antibiotics.      ____________________________________________   FINAL CLINICAL IMPRESSION(S) / ED DIAGNOSES  Final diagnoses:  Colitis  Hypokalemia      NEW MEDICATIONS STARTED DURING THIS VISIT:  New Prescriptions   No medications on file     Note:  This document was prepared using Dragon voice recognition software and may include unintentional dictation errors.    Dionne Bucy, MD 01/22/17 1630

## 2017-01-23 LAB — GASTROINTESTINAL PANEL BY PCR, STOOL (REPLACES STOOL CULTURE)

## 2017-01-23 LAB — CBC
HEMATOCRIT: 33.1 % — AB (ref 35.0–47.0)
HEMOGLOBIN: 11.6 g/dL — AB (ref 12.0–16.0)
MCH: 33.2 pg (ref 26.0–34.0)
MCHC: 35 g/dL (ref 32.0–36.0)
MCV: 94.8 fL (ref 80.0–100.0)
Platelets: 232 10*3/uL (ref 150–440)
RBC: 3.5 MIL/uL — ABNORMAL LOW (ref 3.80–5.20)
RDW: 13.5 % (ref 11.5–14.5)
WBC: 4.8 10*3/uL (ref 3.6–11.0)

## 2017-01-23 LAB — BASIC METABOLIC PANEL
ANION GAP: 7 (ref 5–15)
CO2: 24 mmol/L (ref 22–32)
Calcium: 8.1 mg/dL — ABNORMAL LOW (ref 8.9–10.3)
Chloride: 113 mmol/L — ABNORMAL HIGH (ref 101–111)
Creatinine, Ser: 0.68 mg/dL (ref 0.44–1.00)
GFR calc Af Amer: >60 mL/min (ref >60)
GLUCOSE: 86 mg/dL (ref 65–99)
POTASSIUM: 3.3 mmol/L — AB (ref 3.5–5.1)
Sodium: 144 mmol/L (ref 135–145)

## 2017-01-23 LAB — C DIFFICILE QUICK SCREEN W PCR REFLEX
C DIFFICILE (CDIFF) TOXIN: NEGATIVE
C Diff antigen: NEGATIVE
C Diff interpretation: NOT DETECTED

## 2017-01-23 LAB — MAGNESIUM: Magnesium: 1.5 mg/dL — ABNORMAL LOW (ref 1.7–2.4)

## 2017-01-23 MED ORDER — DICYCLOMINE HCL 10 MG PO CAPS
10.0000 mg | ORAL_CAPSULE | Freq: Three times a day (TID) | ORAL | Status: DC
  Administered 2017-01-23 – 2017-01-25 (×7): 10 mg via ORAL
  Filled 2017-01-23 (×7): qty 1

## 2017-01-23 MED ORDER — MAGNESIUM SULFATE 2 GM/50ML IV SOLN
2.0000 g | Freq: Once | INTRAVENOUS | Status: AC
  Administered 2017-01-23: 2 g via INTRAVENOUS
  Filled 2017-01-23: qty 50

## 2017-01-23 MED ORDER — METRONIDAZOLE 500 MG PO TABS
500.0000 mg | ORAL_TABLET | Freq: Three times a day (TID) | ORAL | Status: DC
  Administered 2017-01-23 (×2): 500 mg via ORAL
  Filled 2017-01-23 (×4): qty 1

## 2017-01-23 NOTE — Progress Notes (Signed)
[employer] - [place] at [facility]   PATIENT NAME: Shawna [name]    MR#:  [id number]  DATE OF BIRTH:  Oct 12, 1965  SUBJECTIVE:  CHIEF COMPLAINT:   Chief Complaint  Patient presents with  . Abdominal Pain   No diarrhea this morning, better abdominal pain. REVIEW OF SYSTEMS:  Review of Systems  Constitutional: Negative for chills, fever and malaise/fatigue.  HENT: Negative for sore throat.   Eyes: Negative for blurred vision and double vision.  Respiratory: Negative for cough, hemoptysis, shortness of breath, wheezing and stridor.   Cardiovascular: Negative for chest pain, palpitations, orthopnea and leg swelling.  Gastrointestinal: Positive for abdominal pain and nausea. Negative for blood in stool, constipation, diarrhea, melena and vomiting.  Genitourinary: Negative for dysuria, flank pain and hematuria.  Musculoskeletal: Negative for back pain and joint pain.  Neurological: Negative for dizziness, sensory change, focal weakness, seizures, loss of consciousness, weakness and headaches.  Endo/Heme/Allergies: Negative for polydipsia.  Psychiatric/Behavioral: Negative for depression. The patient is not nervous/anxious.     DRUG ALLERGIES:   Allergies  Allergen Reactions  . Ciprofloxacin Hcl Nausea Only  . Fentanyl Nausea And Vomiting and Other (See Comments)    Patient states this makes her have severe flu symptoms.  . Haldol [Haloperidol]     Made pt "feel really weird".  . Morphine And Related Nausea And Vomiting    Patient states this medication makes her have sever flu symptoms.  . Propofol   . Vicodin [Hydrocodone-Acetaminophen] Nausea And Vomiting   VITALS:  Blood pressure 114/77, pulse (!) 58, temperature 98.1 F (36.7 C), temperature source Oral, resp. rate 18, height  (1.676 m), weight 148 lb (67.1 kg), SpO2 100 %. PHYSICAL EXAMINATION:  Physical Exam  Constitutional: She is oriented to person, place, and time and well-developed,  well-nourished, and in no distress.  HENT:  Head: Normocephalic.  Mouth/Throat: Oropharynx is clear and moist.  Eyes: Pupils are equal, round, and reactive to light. Conjunctivae and EOM are normal. No scleral icterus.  Neck: Normal range of motion. Neck supple. No JVD present. No tracheal deviation present.  Cardiovascular: Normal rate, regular rhythm and normal heart sounds.  Exam reveals no gallop.   No murmur heard. Pulmonary/Chest: Effort normal and breath sounds normal. No respiratory distress. She has no wheezes. She has no rales.  Abdominal: Soft. Bowel sounds are normal. She exhibits no distension. There is tenderness. There is no rebound.  Musculoskeletal: Normal range of motion. She exhibits no edema or tenderness.  Neurological: She is alert and oriented to person, place, and time. No cranial nerve deficit.  Skin: No rash noted. No erythema.  Psychiatric: Affect normal.   LABORATORY PANEL:  Female CBC  Recent Labs Lab 01/23/17 0428  WBC 4.8  HGB 11.6*  HCT 33.1*  PLT 232   ------------------------------------------------------------------------------------------------------------------ Chemistries   Recent Labs Lab 01/22/17 1107 01/23/17 0428  NA 139 144  K 2.9* 3.3*  CL 106 113*  CO2 23 24  GLUCOSE 143* 86  BUN 5* <5*  CREATININE 0.77 0.68  CALCIUM 9.1 8.1*  MG 1.6* 1.5*  AST 27  --   ALT 16  --   ALKPHOS 66  --   BILITOT 0.8  --    RADIOLOGY:  No results found. ASSESSMENT AND PLAN:   1. Colitis Continue Zosyn, IV fluids, antiemetics,advance to full liquid diet.  Negative C. difficile test and GI panel.  2. Hypokalemia-secondary to the ongoing diarrhea, Improving with potassium supplement. Hypomagnesemia. Give IV  magnesium and a follow-up level.  3. History of irritable bowel syndrome-continue Bentyl tid.  4. GERD-continue Protonix.  5. Anxiety-continue Klonopin as needed.  Tobacco abuse. Smoking cessation was counseled for 4  minutes.  All the records are reviewed and case discussed with Care Management/Social Worker. Management plans discussed with the patient, family and they are in agreement.  CODE STATUS: Full Code  TOTAL TIME TAKING CARE OF THIS PATIENT: 35 minutes.   More than 50% of the time was spent in counseling/coordination of care: YES  POSSIBLE D/C IN 1-2 DAYS, DEPENDING ON CLINICAL CONDITION.   Shaune Pollack M.D on 01/23/2017 at 2:30 PM  Between 7am to 6pm - Pager - [phone]  After 6pm go to [email or web address] - Therapist, nutritional Hospitalists

## 2017-01-24 LAB — BASIC METABOLIC PANEL
Anion gap: 6 (ref 5–15)
BUN: <5 mg/dL — ABNORMAL LOW (ref 6–20)
CALCIUM: 8.3 mg/dL — AB (ref 8.9–10.3)
CHLORIDE: 111 mmol/L (ref 101–111)
CO2: 24 mmol/L (ref 22–32)
CREATININE: 0.66 mg/dL (ref 0.44–1.00)
GFR calc non Af Amer: >60 mL/min (ref >60)
Glucose, Bld: 97 mg/dL (ref 65–99)
Potassium: 3.4 mmol/L — ABNORMAL LOW (ref 3.5–5.1)
SODIUM: 141 mmol/L (ref 135–145)

## 2017-01-24 LAB — MAGNESIUM: MAGNESIUM: 1.9 mg/dL (ref 1.7–2.4)

## 2017-01-24 MED ORDER — POTASSIUM CHLORIDE CRYS ER 20 MEQ PO TBCR
40.0000 meq | EXTENDED_RELEASE_TABLET | Freq: Once | ORAL | Status: AC
  Administered 2017-01-24: 40 meq via ORAL
  Filled 2017-01-24: qty 2

## 2017-01-24 MED ORDER — ALUM & MAG HYDROXIDE-SIMETH 200-200-20 MG/5ML PO SUSP
30.0000 mL | Freq: Three times a day (TID) | ORAL | Status: DC | PRN
  Administered 2017-01-24: 30 mL via ORAL
  Filled 2017-01-24: qty 30

## 2017-01-24 MED ORDER — ENSURE ENLIVE PO LIQD
237.0000 mL | Freq: Two times a day (BID) | ORAL | Status: DC
  Administered 2017-01-25: 237 mL via ORAL

## 2017-01-24 MED ORDER — VANCOMYCIN 50 MG/ML ORAL SOLUTION
125.0000 mg | Freq: Four times a day (QID) | ORAL | Status: DC
Start: 2017-01-24 — End: 2017-01-24
  Filled 2017-01-24 (×3): qty 2.5

## 2017-01-24 MED ORDER — HYDROXYZINE HCL 25 MG PO TABS
50.0000 mg | ORAL_TABLET | Freq: Three times a day (TID) | ORAL | Status: DC | PRN
  Administered 2017-01-24: 100 mg via ORAL
  Filled 2017-01-24: qty 4

## 2017-01-24 MED ORDER — ADULT MULTIVITAMIN W/MINERALS CH
1.0000 | ORAL_TABLET | Freq: Every day | ORAL | Status: DC
  Administered 2017-01-25: 1 via ORAL
  Filled 2017-01-24: qty 1

## 2017-01-24 NOTE — Progress Notes (Signed)
Per Dr. Imogene Burn okay to change Vistaril order to 50-100mg  3 x daily PRN. Pt stated that she sometimes takes two tablets if she needs it. Verified with pharmacy that this would not be too large of a dose.

## 2017-01-24 NOTE — Progress Notes (Signed)
Initial Nutrition Assessment  DOCUMENTATION CODES:   Non-severe (moderate) malnutrition in context of acute illness/injury  INTERVENTION:  Provide Ensure Enlive po BID, each supplement provides 350 kcal and 20 grams of protein. Patient prefers chocolate flavor.  Provide multivitamin with minerals daily.  Encouraged adequate intake of calories and protein at meals to prevent unintentional weight loss/loss of lean body mass.  Discussed keeping a food and symptom log to help patient identify which foods exacerbate her IBS symptoms.  NUTRITION DIAGNOSIS:   Malnutrition (Moderate) related to acute illness (colitis) as evidenced by 1.3 percent weight loss over 1 week, mild depletion of body fat, mild depletion of muscle mass.  GOAL:   Patient will meet greater than or equal to 90% of their needs  MONITOR:   PO intake, Supplement acceptance, Labs, Weight trends, I & O's  REASON FOR ASSESSMENT:   Malnutrition Screening Tool    ASSESSMENT:   51 year old female with PMHx of sciatica, bulging lumbar disc, asthma, GERD, IBS who presented with 5 day history of abdominal pain admitted for colitis. Negative C. Difficile test and GI panel.   Spoke with patient at bedside. She reports she has a hx of IBS. However, since 9/23 she has experienced abdominal pain and diarrhea. At first she was still able to eat fairly regularly, but then reports her eating has since decreased. Today she did not have breakfast but did have some rice and potatoes with gravy for lunch (no protein). She typically eats two meals per day. Breakfast may be a sausage biscuit. She may make steak with onions and potatoes or another meat with sides for her other meal. She is not sure which foods worsen her IBS symptoms. Occasionally she will have diarrhea after eating.   Patient reports her UBW is 150 lbs. Per her report she lost 2 lbs (1.3% body weight) over one week, which is significant for time frame. Unable to verify  weight as bed scale not working correctly.  Meal Completion: 100% of lunch yesterday; 0% of breakfast this morning, 95% of lunch today  Medications reviewed and include: Bentyl 10 mg TID, pantoprazole, Zosyn.  Labs reviewed: Potassium 3.4, BUN <5.  Nutrition-Focused physical exam completed. Findings are mild fat depletion (mild depletion of orbital region and upper arm region), mild-moderate muscle depletion (mild depletion of clavicle bone region, clavicle/acromion bone region, scapular bone region; moderate depletion of temple region), and no edema.   Diet Order:  DIET SOFT Room service appropriate? Yes; Fluid consistency: Thin Diet - low sodium heart healthy  Skin:  Reviewed, no issues  Last BM:  01/23/2017  Height:   Ht Readings from Last 1 Encounters:  01/22/17  (1.676 m)    Weight:   Wt Readings from Last 1 Encounters:  01/22/17 148 lb (67.1 kg)    Ideal Body Weight:  59.1 kg  BMI:  Body mass index is 23.89 kg/m.  Estimated Nutritional Needs:   Kcal:  1580-1710 (MSJ x 1.2-1.3)  Protein:  74-87 grams (1.1-1.3 grams/kg)  Fluid:  2-2.3 L/day (30-35 ml/kg)  EDUCATION NEEDS:   Education needs addressed  Helane Rima, MS, RD, LDN Pager: [phone] After Hours Pager: [phone]

## 2017-01-24 NOTE — Progress Notes (Signed)
[employer] - [place] at [facility]   PATIENT NAME: Shawna [name]    MR#:  [id number]  DATE OF BIRTH:  1965-11-25  SUBJECTIVE:  CHIEF COMPLAINT:   Chief Complaint  Patient presents with  . Abdominal Pain   still abdominal pain, diarrhea 4 times since yesterday. REVIEW OF SYSTEMS:  Review of Systems  Constitutional: Negative for chills, fever and malaise/fatigue.  HENT: Negative for sore throat.   Eyes: Negative for blurred vision and double vision.  Respiratory: Negative for cough, hemoptysis, shortness of breath, wheezing and stridor.   Cardiovascular: Negative for chest pain, palpitations, orthopnea and leg swelling.  Gastrointestinal: Positive for abdominal pain, diarrhea and nausea. Negative for blood in stool, constipation, melena and vomiting.  Genitourinary: Negative for dysuria, flank pain and hematuria.  Musculoskeletal: Negative for back pain and joint pain.  Neurological: Negative for dizziness, sensory change, focal weakness, seizures, loss of consciousness, weakness and headaches.  Endo/Heme/Allergies: Negative for polydipsia.  Psychiatric/Behavioral: Negative for depression. The patient is nervous/anxious.     DRUG ALLERGIES:   Allergies  Allergen Reactions  . Ciprofloxacin Hcl Nausea Only  . Fentanyl Nausea And Vomiting and Other (See Comments)    Patient states this makes her have severe flu symptoms.  . Haldol [Haloperidol]     Made pt "feel really weird".  . Morphine And Related Nausea And Vomiting    Patient states this medication makes her have sever flu symptoms.  . Propofol   . Vicodin [Hydrocodone-Acetaminophen] Nausea And Vomiting   VITALS:  Blood pressure 127/82, pulse 64, temperature 98.1 F (36.7 C), temperature source Oral, resp. rate 14, height  (1.676 m), weight 148 lb (67.1 kg), SpO2 98 %. PHYSICAL EXAMINATION:  Physical Exam  Constitutional: She is oriented to person, place, and time and well-developed,  well-nourished, and in no distress.  HENT:  Head: Normocephalic.  Mouth/Throat: Oropharynx is clear and moist.  Eyes: Pupils are equal, round, and reactive to light. Conjunctivae and EOM are normal. No scleral icterus.  Neck: Normal range of motion. Neck supple. No JVD present. No tracheal deviation present.  Cardiovascular: Normal rate, regular rhythm and normal heart sounds.  Exam reveals no gallop.   No murmur heard. Pulmonary/Chest: Effort normal and breath sounds normal. No respiratory distress. She has no wheezes. She has no rales.  Abdominal: Soft. Bowel sounds are normal. She exhibits no distension. There is tenderness. There is no rebound.  Musculoskeletal: Normal range of motion. She exhibits no edema or tenderness.  Neurological: She is alert and oriented to person, place, and time. No cranial nerve deficit.  Skin: No rash noted. No erythema.  Psychiatric: Affect normal.   LABORATORY PANEL:  Female CBC  Recent Labs Lab 01/23/17 0428  WBC 4.8  HGB 11.6*  HCT 33.1*  PLT 232   ------------------------------------------------------------------------------------------------------------------ Chemistries   Recent Labs Lab 01/22/17 1107  01/24/17 0425  NA 139  < > 141  K 2.9*  < > 3.4*  CL 106  < > 111  CO2 23  < > 24  GLUCOSE 143*  < > 97  BUN 5*  < > <5*  CREATININE 0.77  < > 0.66  CALCIUM 9.1  < > 8.3*  MG 1.6*  < > 1.9  AST 27  --   --   ALT 16  --   --   ALKPHOS 66  --   --   BILITOT 0.8  --   --   < > = values in  this interval not displayed. RADIOLOGY:  No results found. ASSESSMENT AND PLAN:   1. Colitis Continue Zosyn, IV fluids, antiemetics,advanced to soft liquid diet.  Negative C. difficile test and GI panel. Pain control.  2. Hypokalemia-secondary to the ongoing diarrhea, Improving with potassium supplement. Hypomagnesemia. Give IV magnesium and improved.  3. History of irritable bowel syndrome-continue Bentyl tid.  4. GERD-continue  Protonix.  5. Anxiety-continue Klonopin as needed.  Tobacco abuse. Smoking cessation was counseled for 4 minutes.  All the records are reviewed and case discussed with Care Management/Social Worker. Management plans discussed with the patient, family and they are in agreement.  CODE STATUS: Full Code  TOTAL TIME TAKING CARE OF THIS PATIENT: 32 minutes.   More than 50% of the time was spent in counseling/coordination of care: YES  POSSIBLE D/C IN 1-2 DAYS, DEPENDING ON CLINICAL CONDITION.   Shaune Pollack M.D on 01/24/2017 at 2:14 PM  Between 7am to 6pm - Pager - [phone]  After 6pm go to [email or web address] - Therapist, nutritional Hospitalists n

## 2017-01-25 LAB — HIV ANTIBODY (ROUTINE TESTING W REFLEX): HIV Screen 4th Generation wRfx: NONREACTIVE

## 2017-01-25 NOTE — Progress Notes (Signed)
Patient's IV removed. Discharged instructions given to patient and she verbalized understanding to instructions. Patient is going via wheelchair by staff member with the family on the side.

## 2017-01-25 NOTE — Discharge Instructions (Signed)
Colitis Colitis is inflammation of the colon. Colitis may last a short time (acute) or it may last a long time (chronic). What are the causes? This condition may be caused by:  Viruses.  Bacteria.  Reactions to medicine.  Certain autoimmune diseases, such as Crohn disease or ulcerative colitis.  What are the signs or symptoms? Symptoms of this condition include:  Diarrhea.  Passing bloody or tarry stool.  Pain.  Fever.  Vomiting.  Tiredness (fatigue).  Weight loss.  Bloating.  Sudden increase in abdominal pain.  Having fewer bowel movements than usual.  How is this diagnosed? This condition is diagnosed with a stool test or a blood test. You may also have other tests, including X-rays, a CT scan, or a colonoscopy. How is this treated? Treatment may include:  Resting the bowel. This involves not eating or drinking for a period of time.  Fluids that are given through an IV tube.  Medicine for pain and diarrhea.  Antibiotic medicines.  Cortisone medicines.  Surgery.  Follow these instructions at home: Eating and drinking  Follow instructions from your health care provider about eating or drinking restrictions.  Drink enough fluid to keep your urine clear or pale yellow.  Work with a dietitian to determine which foods cause your condition to flare up.  Avoid foods that cause flare-ups.  Eat a well-balanced diet. Medicines  Take over-the-counter and prescription medicines only as told by your health care provider.  If you were prescribed an antibiotic medicine, take it as told by your health care provider. Do not stop taking the antibiotic even if you start to feel better. General instructions  Keep all follow-up visits as told by your health care provider. This is important. Contact a health care provider if:  Your symptoms do not go away.  You develop new symptoms. Get help right away if:  You have a fever that does not go away with  treatment.  You develop chills.  You have extreme weakness, fainting, or dehydration.  You have repeated vomiting.  You develop severe pain in your abdomen.  You pass bloody or tarry stool. This information is not intended to replace advice given to you by your health care provider. Make sure you discuss any questions you have with your health care provider. Document Released: 05/21/2004 Document Revised: 09/19/2015 Document Reviewed: 08/06/2014 Elsevier Interactive Patient Education  2018 Elsevier Inc.  

## 2017-01-25 NOTE — Progress Notes (Signed)
Pharmacy Antibiotic Note  Shawna [name] is a 51 y.o. female admitted on 01/22/2017 with IAI.  Pharmacy has been consulted for piperacillin/tazobactam dosing.  Patient was started on metronidazole and amoxicillin/clavulanic acid several days PTA. This is day #4 of piperacillin/tazobactam.  Plan: Continue piperacillin/tazobactam 3.375 g IV q8h EI  Height:  (167.6 cm) Weight: 148 lb (67.1 kg) IBW/kg (Calculated) : 59.3  Temp (24hrs), Avg:98 F (36.7 C), Min:97.8 F (36.6 C), Max:98.2 F (36.8 C)   Recent Labs Lab 01/22/17 1107 01/23/17 0428 01/24/17 0425  WBC 10.0 4.8  --   CREATININE 0.77 0.68 0.66    Estimated Creatinine Clearance: 79.6 mL/min (by C-G formula based on SCr of 0.66 mg/dL).    Antimicrobial: Piperacillin/tazobactam 9/28 >> Metronidazole 9/28 >> 9/29  Cultures: 9/29 GI panel: nothing detected in BCID panel  Thank you for allowing pharmacy to be a part of this patient's care.  Cindi Carbon, Pharm.D., BCPS Clinical Pharmacist 01/25/2017 10:43 AM

## 2017-01-27 NOTE — Discharge Summary (Signed)
[facility] [place] [facility]   PATIENT NAME: Shawna [name]    MR#:  [id number]  DATE OF BIRTH:  December 10, 1965  DATE OF ADMISSION:  01/22/2017   ADMITTING PHYSICIAN: Houston Siren, MD  DATE OF DISCHARGE: 01/25/2017  2:52 PM  PRIMARY CARE PHYSICIAN: Sherron Monday, MD   ADMISSION DIAGNOSIS:  Hypokalemia [E87.6] Colitis [K52.9] DISCHARGE DIAGNOSIS:  Active Problems:   Colitis  SECONDARY DIAGNOSIS:   Past Medical History:  Diagnosis Date  . Asthma   . Bulging lumbar disc   . Bulging lumbar disc   . GERD (gastroesophageal reflux disease)   . Insomnia   . Kidney stone   . Pneumonia DEC 2016  . Sciatic leg pain    Right side  . Sciatica of right side   . Weakness    Bil knees, and ankles   HOSPITAL COURSE:   1. Colitis - resolved. Negative C. difficile test and GI panel.   2. Hypokalemia-secondary to the ongoing diarrhea, Hypomagnesemia: repleted  3. History of irritable bowel syndrome-continue Bentyl tid.  4. GERD-continue Protonix.  5. Anxiety-continue Klonopin as needed.  Tobacco abuse. Smoking cessation was counseled for 4 minutes.  DISCHARGE CONDITIONS:  stable CONSULTS OBTAINED:   DRUG ALLERGIES:   Allergies  Allergen Reactions  . Ciprofloxacin Hcl Nausea Only  . Fentanyl Nausea And Vomiting and Other (See Comments)    Patient states this makes her have severe flu symptoms.  . Haldol [Haloperidol]     Made pt "feel really weird".  . Morphine And Related Nausea And Vomiting    Patient states this medication makes her have sever flu symptoms.  . Propofol   . Vicodin [Hydrocodone-Acetaminophen] Nausea And Vomiting   DISCHARGE MEDICATIONS:   Allergies as of 01/25/2017      Reactions   Ciprofloxacin Hcl Nausea Only   Fentanyl Nausea And Vomiting, Other (See Comments)   Patient states this makes her have severe flu symptoms.   Haldol [haloperidol]    Made pt "feel really weird".   Morphine And Related Nausea And  Vomiting   Patient states this medication makes her have sever flu symptoms.   Propofol    Vicodin [hydrocodone-acetaminophen] Nausea And Vomiting      Medication List    STOP taking these medications   amoxicillin-clavulanate 875-125 MG tablet Commonly known as:  AUGMENTIN   metroNIDAZOLE 500 MG tablet Commonly known as:  FLAGYL     TAKE these medications   acetaminophen 500 MG tablet Commonly known as:  TYLENOL Take 500 mg by mouth every 6 (six) hours as needed.   albuterol 108 (90 Base) MCG/ACT inhaler Commonly known as:  PROVENTIL HFA;VENTOLIN HFA Inhale 2 puffs into the lungs every 6 (six) hours as needed for wheezing or shortness of breath.   BENTYL 10 MG capsule Generic drug:  dicyclomine Take 10 mg by mouth 3 (three) times daily as needed.   clonazePAM 0.5 MG tablet Commonly known as:  KLONOPIN Take 0.5 mg by mouth 2 (two) times daily as needed for anxiety.   cyanocobalamin 1000 MCG tablet Take 1 tablet (1,000 mcg total) by mouth daily.   dexlansoprazole 60 MG capsule Commonly known as:  DEXILANT Take 1 capsule by mouth daily.   DULoxetine HCl 40 MG Cpep Take 40 mg by mouth daily.   fluticasone 50 MCG/ACT nasal spray Commonly known as:  FLONASE Place 2 sprays into both nostrils daily.   gemfibrozil 600 MG tablet Commonly known as:  LOPID Take  1 tablet (600 mg total) by mouth 2 (two) times daily before a meal.   HYDROcodone-acetaminophen 5-325 MG tablet Commonly known as:  NORCO Take 1 tablet by mouth every 6 (six) hours as needed for severe pain.   hydrOXYzine 50 MG tablet Commonly known as:  ATARAX/VISTARIL Take 50 mg by mouth 3 (three) times daily as needed.   lidocaine 5 % ointment Commonly known as:  XYLOCAINE Apply topically daily as needed (once daily for leg pain).   loperamide 2 MG tablet Commonly known as:  LOPERAMIDE A-D Take 1 tablet (2 mg total) by mouth 4 (four) times daily as needed for diarrhea or loose stools.   ondansetron 4  MG disintegrating tablet Commonly known as:  ZOFRAN ODT Take 1 tablet (4 mg total) by mouth every 8 (eight) hours as needed for nausea or vomiting.   QUEtiapine 100 MG tablet Commonly known as:  SEROQUEL Take 1 tablet (100 mg total) by mouth at bedtime.   QUEtiapine 25 MG tablet Commonly known as:  SEROQUEL Take 1 tablet (25 mg total) by mouth 3 (three) times daily as needed (anxiety).   zolpidem 10 MG tablet Commonly known as:  AMBIEN Take 10 mg by mouth at bedtime as needed for sleep.        DISCHARGE INSTRUCTIONS:   DIET:  Regular diet DISCHARGE CONDITION:  Good ACTIVITY:  Activity as tolerated OXYGEN:  Home Oxygen: No.  Oxygen Delivery: room air DISCHARGE LOCATION:  home   If you experience worsening of your admission symptoms, develop shortness of breath, life threatening emergency, suicidal or homicidal thoughts you must seek medical attention immediately by calling 911 or calling your MD immediately  if symptoms less severe.  You Must read complete instructions/literature along with all the possible adverse reactions/side effects for all the Medicines you take and that have been prescribed to you. Take any new Medicines after you have completely understood and accpet all the possible adverse reactions/side effects.   Please note  You were cared for by a hospitalist during your hospital stay. If you have any questions about your discharge medications or the care you received while you were in the hospital after you are discharged, you can call the unit and asked to speak with the hospitalist on call if the hospitalist that took care of you is not available. Once you are discharged, your primary care physician will handle any further medical issues. Please note that NO REFILLS for any discharge medications will be authorized once you are discharged, as it is imperative that you return to your primary care physician (or establish a relationship with a primary care  physician if you do not have one) for your aftercare needs so that they can reassess your need for medications and monitor your lab values.    On the day of Discharge:  VITAL SIGNS:  Blood pressure 102/73, pulse 73, temperature 98.2 F (36.8 C), temperature source Oral, resp. rate 18, height  (1.676 m), weight 67.1 kg (148 lb), SpO2 95 %. PHYSICAL EXAMINATION:  GENERAL:  51 y.o.-year-old patient lying in the bed with no acute distress.  EYES: Pupils equal, round, reactive to light and accommodation. No scleral icterus. Extraocular muscles intact.  HEENT: Head atraumatic, normocephalic. Oropharynx and nasopharynx clear.  NECK:  Supple, no jugular venous distention. No thyroid enlargement, no tenderness.  LUNGS: Normal breath sounds bilaterally, no wheezing, rales,rhonchi or crepitation. No use of accessory muscles of respiration.  CARDIOVASCULAR: S1, S2 normal. No murmurs, rubs, or gallops.  ABDOMEN: Soft, non-tender, non-distended. Bowel sounds present. No organomegaly or mass.  EXTREMITIES: No pedal edema, cyanosis, or clubbing.  NEUROLOGIC: Cranial nerves II through XII are intact. Muscle strength 5/5 in all extremities. Sensation intact. Gait not checked.  PSYCHIATRIC: The patient is alert and oriented x 3.  SKIN: No obvious rash, lesion, or ulcer.  DATA REVIEW:   CBC  Recent Labs Lab 01/23/17 0428  WBC 4.8  HGB 11.6*  HCT 33.1*  PLT 232    Chemistries   Recent Labs Lab 01/22/17 1107  01/24/17 0425  NA 139  < > 141  K 2.9*  < > 3.4*  CL 106  < > 111  CO2 23  < > 24  GLUCOSE 143*  < > 97  BUN 5*  < > <5*  CREATININE 0.77  < > 0.66  CALCIUM 9.1  < > 8.3*  MG 1.6*  < > 1.9  AST 27  --   --   ALT 16  --   --   ALKPHOS 66  --   --   BILITOT 0.8  --   --   < > = values in this interval not displayed.   Follow-up Information    Sherron Monday, MD. Go on 02/08/2017.   Specialty:  Internal Medicine Why:  Dr. Ellsworth Lennox, Monday, October 15 at 11:45 a.m.,  [phone] Contact information: [address] [place] [address] [phone]        Christena Deem, MD. Schedule an appointment as soon as possible for a visit on 02/05/2017.   Specialty:  Gastroenterology Why:  9:15 am as scheduled Contact information: [address] [facility] [place] [address] [phone]           Management plans discussed with the patient, family and they are in agreement.  CODE STATUS: Prior   TOTAL TIME TAKING CARE OF THIS PATIENT: 45 minutes.    Delfino Lovett M.D on 01/27/2017 at 7:27 PM  Between 7am to 6pm - Pager - [phone]  After 6pm go to [email or web address] - Social research officer, government  [facility]  Office  [phone]  CC: Primary care physician; Sherron Monday, MD   Note: This dictation was prepared with Dragon dictation along with smaller phrase technology. Any transcriptional errors that result from this process are unintentional.

## 2017-02-08 ENCOUNTER — Encounter: Payer: Self-pay | Admitting: *Deleted

## 2017-02-08 ENCOUNTER — Emergency Department
Admission: EM | Admit: 2017-02-08 | Discharge: 2017-02-08 | Disposition: A | Discharge status: Home / Self Care | Payer: Medicaid Other | Attending: Emergency Medicine | Admitting: Emergency Medicine

## 2017-02-08 DIAGNOSIS — Z79899 Other long term (current) drug therapy: Secondary | ICD-10-CM | POA: Diagnosis not present

## 2017-02-08 DIAGNOSIS — F17210 Nicotine dependence, cigarettes, uncomplicated: Secondary | ICD-10-CM | POA: Diagnosis not present

## 2017-02-08 DIAGNOSIS — R1084 Generalized abdominal pain: Secondary | ICD-10-CM | POA: Insufficient documentation

## 2017-02-08 DIAGNOSIS — J449 Chronic obstructive pulmonary disease, unspecified: Secondary | ICD-10-CM | POA: Diagnosis not present

## 2017-02-08 DIAGNOSIS — J45909 Unspecified asthma, uncomplicated: Secondary | ICD-10-CM | POA: Diagnosis not present

## 2017-02-08 DIAGNOSIS — R109 Unspecified abdominal pain: Secondary | ICD-10-CM | POA: Diagnosis present

## 2017-02-08 LAB — COMPREHENSIVE METABOLIC PANEL
ALBUMIN: 3.6 g/dL (ref 3.5–5.0)
ALT: 12 U/L — AB (ref 14–54)
AST: 21 U/L (ref 15–41)
Alkaline Phosphatase: 75 U/L (ref 38–126)
Anion gap: 10 (ref 5–15)
BUN: 9 mg/dL (ref 6–20)
CHLORIDE: 107 mmol/L (ref 101–111)
CO2: 21 mmol/L — AB (ref 22–32)
CREATININE: 0.73 mg/dL (ref 0.44–1.00)
Calcium: 9 mg/dL (ref 8.9–10.3)
GFR calc Af Amer: >60 mL/min (ref >60)
GFR calc non Af Amer: >60 mL/min (ref >60)
GLUCOSE: 122 mg/dL — AB (ref 65–99)
Potassium: 3.9 mmol/L (ref 3.5–5.1)
SODIUM: 138 mmol/L (ref 135–145)
Total Bilirubin: 0.8 mg/dL (ref 0.3–1.2)
Total Protein: 6.8 g/dL (ref 6.5–8.1)

## 2017-02-08 LAB — CBC
HCT: 38.1 % (ref 35.0–47.0)
Hemoglobin: 13.1 g/dL (ref 12.0–16.0)
MCH: 32.3 pg (ref 26.0–34.0)
MCHC: 34.3 g/dL (ref 32.0–36.0)
MCV: 94.1 fL (ref 80.0–100.0)
PLATELETS: 265 10*3/uL (ref 150–440)
RBC: 4.05 MIL/uL (ref 3.80–5.20)
RDW: 12.9 % (ref 11.5–14.5)
WBC: 9.5 10*3/uL (ref 3.6–11.0)

## 2017-02-08 LAB — LIPASE, BLOOD: Lipase: 16 U/L (ref 11–51)

## 2017-02-08 MED ORDER — ONDANSETRON HCL 4 MG/2ML IJ SOLN
4.0000 mg | Freq: Once | INTRAMUSCULAR | Status: AC
  Administered 2017-02-08: 4 mg via INTRAVENOUS
  Filled 2017-02-08: qty 2

## 2017-02-08 MED ORDER — TRAMADOL HCL 50 MG PO TABS: 50.0000 mg | ORAL_TABLET | Freq: Four times a day (QID) | ORAL | 0 refills | Status: DC | PRN

## 2017-02-08 MED ORDER — SODIUM CHLORIDE 0.9 % IV SOLN
1000.0000 mL | Freq: Once | INTRAVENOUS | Status: AC
  Administered 2017-02-08: 1000 mL via INTRAVENOUS

## 2017-02-08 MED ORDER — KETOROLAC TROMETHAMINE 30 MG/ML IJ SOLN
30.0000 mg | Freq: Once | INTRAMUSCULAR | Status: AC
  Administered 2017-02-08: 30 mg via INTRAVENOUS
  Filled 2017-02-08: qty 1

## 2017-02-08 NOTE — ED Triage Notes (Signed)
Pt arrives via EMS from home with abd pain and  Vomiting, states on 9/23 she was diagnosed with colitis and states she was not given and rxs despite it being on her discharge paper, EMS gave 4 mg zofran PTA

## 2017-02-08 NOTE — ED Notes (Signed)
Pt presents with worsening pain in her lower left abdomen "between hip bone and belly button." She states she has been in pain since 9/23 and she was hospitalized from 9/28 to 10/1. She states that the pain got better for a while and returned. Her last discharge papers show that she had follow up appt with gastroenterologist on 10/12 and with pcp today, but pt states she did not know about it. Later she says she didn't have a ride so she cancelled.

## 2017-02-08 NOTE — ED Provider Notes (Signed)
[facility] Emergency Department Provider Note   ____________________________________________    I have reviewed the triage vital signs and the nursing notes.   HISTORY  Chief Complaint Abdominal Pain     HPI Shawna [name] is a 51 y.o. female Who presents with complaints of abdominal pain. Patient reports this feels similar to when she was admitted to the hospital for colitis recently. Review of records indicate that she was discharged on October 1.C. difficile and biofire testing was negativeduring hospitalization. She had a CT scan within the last few weeks which showed pancolitis. She felt much better after hospitalization but reports her symptoms have returned. Patient did not go to either of her follow-up appointments that were scheduled for her.   Past Medical History:  Diagnosis Date  . Asthma   . Bulging lumbar disc   . Bulging lumbar disc   . GERD (gastroesophageal reflux disease)   . Insomnia   . Kidney stone   . Pneumonia DEC 2016  . Sciatic leg pain    Right side  . Sciatica of right side   . Weakness    Bil knees, and ankles    Patient Active Problem List   Diagnosis Date Noted  . Colitis 01/22/2017  . Dyslipidemia 07/26/2016  . Low vitamin B12 level 07/26/2016  . Major depressive disorder, 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  . Overdose of sedative or hypnotic 07/23/2016  . COPD exacerbation (HCC) 07/23/2016  . GERD (gastroesophageal reflux disease) 07/23/2016  . Cannabis use disorder, moderate, 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  . Tobacco use disorder 07/23/2016  . Sedative, hypnotic or anxiolytic use disorder, severe, 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  . Bacterial pneumonia 01/23/2015  . Leukocytosis 01/23/2015  . Sepsis (HCC) 01/23/2015  . Hypoxemia 01/23/2015  . Opioid use disorder, moderate, dependence (HCC) 12/31/2014  . Anxiety disorder, unspecified 12/31/2014    Past Surgical History:  Procedure  Laterality Date  . ABDOMINAL HYSTERECTOMY     Partial  . ESOPHAGOGASTRODUODENOSCOPY (EGD) WITH PROPOFOL N/A 11/27/2015   Procedure: ESOPHAGOGASTRODUODENOSCOPY (EGD) WITH PROPOFOL;  Surgeon: Christena Deem, MD;  Location: [facility] ENDOSCOPY;  Service: Endoscopy;  Laterality: N/A;  . SHOULDER SURGERY Right   . TONSILLECTOMY    . TUBAL LIGATION      Prior to Admission medications   Medication Sig Start Date End Date Taking? Authorizing Provider  clonazePAM (KLONOPIN) 0.5 MG tablet Take 0.5 mg by mouth 2 (two) times daily as needed for anxiety.   Yes [provider]  dexlansoprazole (DEXILANT) 60 MG capsule Take 1 capsule by mouth daily.   Yes [provider]  dicyclomine (BENTYL) 10 MG capsule Take 10 mg by mouth 3 (three) times daily as needed.    Yes [provider]  DULoxetine 40 MG CPEP Take 40 mg by mouth daily. 07/27/16  Yes Pucilowska, Jolanta B, MD  fexofenadine (ALLEGRA) 180 MG tablet Take 180 mg by mouth daily.   Yes [provider]  fluticasone (FLONASE) 50 MCG/ACT nasal spray Place 2 sprays into both nostrils daily.    Yes [provider]  ondansetron (ZOFRAN ODT) 4 MG disintegrating tablet Take 1 tablet (4 mg total) by mouth every 8 (eight) hours as needed for nausea or vomiting. 01/17/17  Yes Merrily Brittle, MD  zolpidem (AMBIEN) 10 MG tablet Take 10 mg by mouth at bedtime as needed for sleep.   Yes [provider]  acetaminophen (TYLENOL) 500 MG tablet Take 500 mg by mouth every 6 (six)  hours as needed.    [provider]  albuterol (PROVENTIL HFA;VENTOLIN HFA) 108 (90 Base) MCG/ACT inhaler Inhale 2 puffs into the lungs every 6 (six) hours as needed for wheezing or shortness of breath. 01/16/16   Jennye Moccasin, MD  gemfibrozil (LOPID) 600 MG tablet Take 1 tablet (600 mg total) by mouth 2 (two) times daily before a meal. Patient not taking: Reported on 01/22/2017 07/26/16   Pucilowska, Ellin Goodie, MD  HYDROcodone-acetaminophen  (NORCO) 5-325 MG tablet Take 1 tablet by mouth every 6 (six) hours as needed for severe pain. Patient not taking: Reported on 01/22/2017 01/17/17   Merrily Brittle, MD  hydrOXYzine (ATARAX/VISTARIL) 50 MG tablet Take 50 mg by mouth every 6 (six) hours as needed.     [provider]  lidocaine (XYLOCAINE) 5 % ointment Apply topically daily as needed (once daily for leg pain). Patient not taking: Reported on 01/22/2017 07/26/16   Pucilowska, Braulio Conte B, MD  loperamide (LOPERAMIDE A-D) 2 MG tablet Take 1 tablet (2 mg total) by mouth 4 (four) times daily as needed for diarrhea or loose stools. Patient not taking: Reported on 01/22/2017 07/07/16   Merrily Brittle, MD  QUEtiapine (SEROQUEL) 100 MG tablet Take 1 tablet (100 mg total) by mouth at bedtime. Patient not taking: Reported on 01/22/2017 07/26/16   Pucilowska, Ellin Goodie, MD  QUEtiapine (SEROQUEL) 25 MG tablet Take 1 tablet (25 mg total) by mouth 3 (three) times daily as needed (anxiety). Patient not taking: Reported on 01/22/2017 07/26/16   Pucilowska, Ellin Goodie, MD  traMADol (ULTRAM) 50 MG tablet Take 1 tablet (50 mg total) by mouth every 6 (six) hours as needed. 02/08/17 02/08/18  Jene Every, MD  vitamin B-12 1000 MCG tablet Take 1 tablet (1,000 mcg total) by mouth daily. Patient not taking: Reported on 01/22/2017 07/26/16   Shari Prows, MD     Allergies Ciprofloxacin hcl; Fentanyl; Haldol [haloperidol]; Morphine and related; Propofol; and Vicodin [hydrocodone-acetaminophen]  Family History  Problem Relation Age of Onset  . Fibromyalgia Mother     Social History Social History  Substance Use Topics  . Smoking status: Current Every Day Smoker    Packs/day: 1.00    Years: 40.00    Types: Cigarettes  . Smokeless tobacco: Current User  . Alcohol use No    Review of Systems  Constitutional: No fever/chills Eyes: No visual changes.  ENT: No sore throat. Cardiovascular: Denies chest pain. Respiratory: Denies shortness of  breath. Gastrointestinal: as above.   Genitourinary: Negative for dysuria. Musculoskeletal: Negative for back pain. Skin: Negative for rash. Neurological: Negative for headaches or weakness   ____________________________________________   PHYSICAL EXAM:  VITAL SIGNS: ED Triage Vitals  Enc Vitals Group     BP 02/08/17 1305 113/80     Pulse Rate 02/08/17 1305 71     Resp 02/08/17 1305 18     Temp 02/08/17 1305 98.2 F (36.8 C)     Temp Source 02/08/17 1305 Oral     SpO2 02/08/17 1305 98 %     Weight 02/08/17 1303 67.1 kg (148 lb)     Height 02/08/17 1303 1.676 m ( )     Head Circumference --      Peak Flow --      Pain Score 02/08/17 1303 10     Pain Loc --      Pain Edu? --      Excl. in GC? --     Constitutional: Alert and oriented. No acute  distress. Pleasant and interactive Eyes: Conjunctivae are normal.   Nose: No congestion/rhinnorhea. Mouth/Throat: Mucous membranes are moist.    Cardiovascular: Normal rate, regular rhythm. Grossly normal heart sounds.  Good peripheral circulation. Respiratory: Normal respiratory effort.  No retractions. Lungs CTAB. Gastrointestinal: Soft and nontender. No distention.  No CVA tenderness. Genitourinary: deferred Musculoskeletal: No lower extremity tenderness nor edema.  Warm and well perfused Neurologic:  Normal speech and language. No gross focal neurologic deficits are appreciated.  Skin:  Skin is warm, dry and intact. No rash noted. Psychiatric: Mood and affect are normal. Speech and behavior are normal.  ____________________________________________   LABS (all labs ordered are listed, but only abnormal results are displayed)  Labs Reviewed  COMPREHENSIVE METABOLIC PANEL - Abnormal; Notable for the following:       Result Value   CO2 21 (*)    Glucose, Bld 122 (*)    ALT 12 (*)    All other components within normal limits  CBC  LIPASE, BLOOD    ____________________________________________  EKG  None ____________________________________________  RADIOLOGY  None ____________________________________________   PROCEDURES  Procedure(s) performed: No    Critical Care performed: No ____________________________________________   INITIAL IMPRESSION / ASSESSMENT AND PLAN / ED COURSE  Pertinent labs & imaging results that were available during my care of the patient were reviewed by me and considered in my medical decision making (see chart for details).  patient presents with abdominal pain. Differential diagnosis includes colitis, nonspecific abdominal pain, pancreatitis, gastritis. No significant nausea or vomiting to suggest SBO.  we will treat with fluids and Zofran and IV Toradol. Patient reports she is only able to tolerate Dilaudid and I do not feel comfortable treating her with such a strong narcotic for overall vague pain.she is upset with me regarding this decision.  We are pending labs, would like to avoid repeat CT scan given recent imaging.  ----------------------------------------- 2:57 PM on 02/08/2017 -----------------------------------------  Lab work is unremarkable, on reexam the patient is feeling better. She would like to go home and I think this is reasonable. She notes she can return at any time if pain worsens. She will follow-up with her PCP. I'll prescribe a short course of pain medications as well    ____________________________________________   FINAL CLINICAL IMPRESSION(S) / ED DIAGNOSES  Final diagnoses:  Generalized abdominal pain      NEW MEDICATIONS STARTED DURING THIS VISIT:  New Prescriptions   TRAMADOL (ULTRAM) 50 MG TABLET    Take 1 tablet (50 mg total) by mouth every 6 (six) hours as needed.     Note:  This document was prepared using Dragon voice recognition software and may include unintentional dictation errors.    Jene Every, MD 02/08/17 [phone]

## 2017-02-20 ENCOUNTER — Encounter: Payer: Self-pay | Admitting: Emergency Medicine

## 2017-02-20 ENCOUNTER — Emergency Department
Admission: EM | Admit: 2017-02-20 | Discharge: 2017-02-20 | Disposition: A | Discharge status: Home / Self Care | Payer: Medicaid Other | Attending: Emergency Medicine | Admitting: Emergency Medicine

## 2017-02-20 ENCOUNTER — Emergency Department: Payer: Medicaid Other

## 2017-02-20 DIAGNOSIS — Z5321 Procedure and treatment not carried out due to patient leaving prior to being seen by health care provider: Secondary | ICD-10-CM | POA: Insufficient documentation

## 2017-02-20 DIAGNOSIS — R531 Weakness: Secondary | ICD-10-CM | POA: Diagnosis not present

## 2017-02-20 LAB — CBC
HEMATOCRIT: 40.1 % (ref 35.0–47.0)
HEMOGLOBIN: 13.9 g/dL (ref 12.0–16.0)
MCH: 32.1 pg (ref 26.0–34.0)
MCHC: 34.6 g/dL (ref 32.0–36.0)
MCV: 92.9 fL (ref 80.0–100.0)
Platelets: 371 10*3/uL (ref 150–440)
RBC: 4.31 MIL/uL (ref 3.80–5.20)
RDW: 12.9 % (ref 11.5–14.5)
WBC: 7.5 10*3/uL (ref 3.6–11.0)

## 2017-02-20 LAB — COMPREHENSIVE METABOLIC PANEL
ALK PHOS: 82 U/L (ref 38–126)
ALT: 20 U/L (ref 14–54)
AST: 29 U/L (ref 15–41)
Albumin: 3.8 g/dL (ref 3.5–5.0)
Anion gap: 11 (ref 5–15)
BILIRUBIN TOTAL: 0.8 mg/dL (ref 0.3–1.2)
BUN: 6 mg/dL (ref 6–20)
CALCIUM: 8.9 mg/dL (ref 8.9–10.3)
CO2: 21 mmol/L — ABNORMAL LOW (ref 22–32)
CREATININE: 0.92 mg/dL (ref 0.44–1.00)
Chloride: 107 mmol/L (ref 101–111)
Glucose, Bld: 153 mg/dL — ABNORMAL HIGH (ref 65–99)
Potassium: 3.6 mmol/L (ref 3.5–5.1)
Sodium: 139 mmol/L (ref 135–145)
TOTAL PROTEIN: 7.3 g/dL (ref 6.5–8.1)

## 2017-02-20 LAB — URINALYSIS, COMPLETE (UACMP) WITH MICROSCOPIC
BILIRUBIN URINE: NEGATIVE
Bacteria, UA: NONE SEEN
GLUCOSE, UA: NEGATIVE mg/dL
HGB URINE DIPSTICK: NEGATIVE
Ketones, ur: NEGATIVE mg/dL
Leukocytes, UA: NEGATIVE
Nitrite: NEGATIVE
Protein, ur: NEGATIVE mg/dL
SPECIFIC GRAVITY, URINE: 1.003 — AB (ref 1.005–1.030)
pH: 7 (ref 5.0–8.0)

## 2017-02-20 LAB — LIPASE, BLOOD: Lipase: 20 U/L (ref 11–51)

## 2017-02-20 LAB — TROPONIN I: Troponin I: <0.03 ng/mL (ref <0.03)

## 2017-02-20 NOTE — ED Triage Notes (Addendum)
Pt c/o left chest pain under breast constant for 2-3 weeks with intermittent increase in pain.  Feels like pain takes breath away at times.  Has had decreased appetite.  Has had generalized weakness with 2 falls as well per pt.  Alert and oriented currently. NAD. VSS.  Respirations unlabored. Pain worse with with eating. Reports enlarged liver.

## 2017-02-22 ENCOUNTER — Telehealth: Payer: Self-pay | Admitting: Emergency Medicine

## 2017-02-22 NOTE — Telephone Encounter (Signed)
Called patient due to lwot to inquire about condition and follow up plans. She says she has appt with gi on Thursday.  Does not want to call pcp because she is fairly sure it is an ulcer.  Has been eating very bland food and taking carafate.  Is doing okay now.  Says there was never chest pain.

## 2017-02-26 ENCOUNTER — Other Ambulatory Visit: Payer: Self-pay | Admitting: Internal Medicine

## 2017-02-26 DIAGNOSIS — K257 Chronic gastric ulcer without hemorrhage or perforation: Secondary | ICD-10-CM

## 2017-03-01 ENCOUNTER — Other Ambulatory Visit: Payer: Self-pay

## 2017-03-01 ENCOUNTER — Encounter: Payer: Self-pay | Admitting: *Deleted

## 2017-03-01 ENCOUNTER — Emergency Department: Payer: Medicaid Other

## 2017-03-01 ENCOUNTER — Emergency Department
Admission: EM | Admit: 2017-03-01 | Discharge: 2017-03-01 | Disposition: A | Discharge status: Home / Self Care | Payer: Medicaid Other | Attending: Emergency Medicine | Admitting: Emergency Medicine

## 2017-03-01 DIAGNOSIS — G8929 Other chronic pain: Secondary | ICD-10-CM | POA: Diagnosis not present

## 2017-03-01 DIAGNOSIS — Z79899 Other long term (current) drug therapy: Secondary | ICD-10-CM | POA: Diagnosis not present

## 2017-03-01 DIAGNOSIS — R109 Unspecified abdominal pain: Secondary | ICD-10-CM | POA: Diagnosis not present

## 2017-03-01 DIAGNOSIS — J449 Chronic obstructive pulmonary disease, unspecified: Secondary | ICD-10-CM | POA: Insufficient documentation

## 2017-03-01 DIAGNOSIS — J45909 Unspecified asthma, uncomplicated: Secondary | ICD-10-CM | POA: Insufficient documentation

## 2017-03-01 DIAGNOSIS — F17210 Nicotine dependence, cigarettes, uncomplicated: Secondary | ICD-10-CM | POA: Diagnosis not present

## 2017-03-01 LAB — COMPREHENSIVE METABOLIC PANEL
ALT: 11 U/L — ABNORMAL LOW (ref 14–54)
ANION GAP: 9 (ref 5–15)
AST: 19 U/L (ref 15–41)
Albumin: 4.3 g/dL (ref 3.5–5.0)
Alkaline Phosphatase: 76 U/L (ref 38–126)
BILIRUBIN TOTAL: 0.8 mg/dL (ref 0.3–1.2)
BUN: 12 mg/dL (ref 6–20)
CO2: 22 mmol/L (ref 22–32)
Calcium: 9.2 mg/dL (ref 8.9–10.3)
Chloride: 106 mmol/L (ref 101–111)
Creatinine, Ser: 0.79 mg/dL (ref 0.44–1.00)
GFR calc non Af Amer: >60 mL/min (ref >60)
GLUCOSE: 134 mg/dL — AB (ref 65–99)
Potassium: 4.2 mmol/L (ref 3.5–5.1)
Sodium: 137 mmol/L (ref 135–145)
TOTAL PROTEIN: 7.6 g/dL (ref 6.5–8.1)

## 2017-03-01 LAB — CBC
HCT: 44.4 % (ref 35.0–47.0)
HEMOGLOBIN: 15.1 g/dL (ref 12.0–16.0)
MCH: 31.7 pg (ref 26.0–34.0)
MCHC: 33.9 g/dL (ref 32.0–36.0)
MCV: 93.3 fL (ref 80.0–100.0)
Platelets: 360 10*3/uL (ref 150–440)
RBC: 4.76 MIL/uL (ref 3.80–5.20)
RDW: 13 % (ref 11.5–14.5)
WBC: 9.9 10*3/uL (ref 3.6–11.0)

## 2017-03-01 LAB — URINALYSIS, COMPLETE (UACMP) WITH MICROSCOPIC
Bacteria, UA: NONE SEEN
Bilirubin Urine: NEGATIVE
GLUCOSE, UA: NEGATIVE mg/dL
HGB URINE DIPSTICK: NEGATIVE
Ketones, ur: NEGATIVE mg/dL
Leukocytes, UA: NEGATIVE
NITRITE: NEGATIVE
PH: 6 (ref 5.0–8.0)
PROTEIN: NEGATIVE mg/dL
Specific Gravity, Urine: 1.016 (ref 1.005–1.030)

## 2017-03-01 LAB — LIPASE, BLOOD: Lipase: 23 U/L (ref 11–51)

## 2017-03-01 MED ORDER — TRAMADOL HCL 50 MG PO TABS
100.0000 mg | ORAL_TABLET | Freq: Once | ORAL | Status: AC
  Administered 2017-03-01: 100 mg via ORAL
  Filled 2017-03-01: qty 2

## 2017-03-01 MED ORDER — ONDANSETRON 4 MG PO TBDP: ORAL_TABLET | ORAL | 0 refills | Status: DC

## 2017-03-01 MED ORDER — TRAMADOL HCL 50 MG PO TABS: 50.0000 mg | ORAL_TABLET | Freq: Four times a day (QID) | ORAL | 0 refills | Status: AC | PRN

## 2017-03-01 MED ORDER — ONDANSETRON 4 MG PO TBDP
4.0000 mg | ORAL_TABLET | Freq: Once | ORAL | Status: AC
  Administered 2017-03-01: 4 mg via ORAL
  Filled 2017-03-01: qty 1

## 2017-03-01 NOTE — Discharge Instructions (Signed)
You have been seen in the Emergency Department (ED) for abdominal pain.  Your evaluation did not identify a clear cause of your symptoms but was generally reassuring.  Please follow up as instructed above regarding today?s emergent visit and the symptoms that are bothering you.  As we discussed, we cannot provide narcotics for chronic pain according to the [facility] policy.  We gave you a prescription for a few Tramadol, but your pain management needs to be handled by your primary care provider, your gastroenterologist, or a pain management clinic.  Return to the ED if your abdominal pain worsens or fails to improve, you develop bloody vomiting, bloody diarrhea, you are unable to tolerate fluids due to vomiting, fever greater than 101, or other symptoms that concern you.

## 2017-03-01 NOTE — ED Notes (Signed)
AAOx3.  Skin warm and dry. NAD.  Ambulates with easy and steady gait.   

## 2017-03-01 NOTE — ED Provider Notes (Signed)
[facility] Emergency Department Provider Note  ____________________________________________   First MD Initiated Contact with Patient 03/01/17 1720     (approximate)  I have reviewed the triage vital signs and the nursing notes.   HISTORY  Chief Complaint Abdominal Pain    HPI Shawna [name] is a 51 y.o. female with medical history as listed below and frequently emergency department visits for chronic abdominal pain and other chronic pain syndromes.  She presents today for worsening left lower quadrant pain.  She states it is always pregnant but it is worse starting today.  She has an appointment with her GI doctor in 2 days for a barium swallow or similar study to further evaluate what she was told was a gastric ulcer.  She states that the pain that she is having is similar to prior but it is stronger and she has been having nausea and vomiting.  Zofran helps the nausea and vomiting but does not make it go away.  She is very upset and sad because of her ongoing pain and reports that she is very frustrated that she has been having pain for so long and no one can tell her exactly why it is occurring.  Nothing makes it better and it is severe, sharp, and aching.  She reports that she takes Bentyl, Carafate, Zofran, and other medications she cannot remember.  She denies fever/chills, chest pain, shortness of breath, and dysuria.  Past Medical History:  Diagnosis Date  . Asthma   . Bulging lumbar disc   . Bulging lumbar disc   . GERD (gastroesophageal reflux disease)   . Insomnia   . Kidney stone   . Pneumonia DEC 2016  . Sciatic leg pain    Right side  . Sciatica of right side   . Weakness    Bil knees, and ankles    Patient Active Problem List   Diagnosis Date Noted  . Colitis 01/22/2017  . Dyslipidemia 07/26/2016  . Low vitamin B12 level 07/26/2016  . Major depressive disorder, 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  . Overdose  of sedative or hypnotic 07/23/2016  . COPD exacerbation (HCC) 07/23/2016  . GERD (gastroesophageal reflux disease) 07/23/2016  . Cannabis use disorder, moderate, 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  . Tobacco use disorder 07/23/2016  . Sedative, hypnotic or anxiolytic use disorder, severe, 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  . Bacterial pneumonia 01/23/2015  . Leukocytosis 01/23/2015  . Sepsis (HCC) 01/23/2015  . Hypoxemia 01/23/2015  . Opioid use disorder, moderate, dependence (HCC) 12/31/2014  . Anxiety disorder, unspecified 12/31/2014    Past Surgical History:  Procedure Laterality Date  . ABDOMINAL HYSTERECTOMY     Partial  . SHOULDER SURGERY Right   . TONSILLECTOMY    . TUBAL LIGATION      Prior to Admission medications   Medication Sig Start Date End Date Taking? Authorizing Provider  acetaminophen (TYLENOL) 500 MG tablet Take 500 mg by mouth every 6 (six) hours as needed.    [provider]  albuterol (PROVENTIL HFA;VENTOLIN HFA) 108 (90 Base) MCG/ACT inhaler Inhale 2 puffs into the lungs every 6 (six) hours as needed for wheezing or shortness of breath. 01/16/16   Jennye Moccasin, MD  clonazePAM (KLONOPIN) 0.5 MG tablet Take 0.5 mg by mouth 2 (two) times daily as needed for anxiety.    [provider]  dexlansoprazole (DEXILANT) 60 MG capsule Take 1 capsule by mouth daily.    [provider]  dicyclomine (BENTYL) 10 MG  capsule Take 10 mg by mouth 3 (three) times daily as needed.     [provider]  DULoxetine 40 MG CPEP Take 40 mg by mouth daily. 07/27/16   Pucilowska, Jolanta B, MD  fexofenadine (ALLEGRA) 180 MG tablet Take 180 mg by mouth daily.    [provider]  fluticasone (FLONASE) 50 MCG/ACT nasal spray Place 2 sprays into both nostrils daily.     [provider]  gemfibrozil (LOPID) 600 MG tablet Take 1 tablet (600 mg total) by mouth 2 (two) times daily before a meal. Patient not taking: Reported on 01/22/2017 07/26/16    Pucilowska, Ellin Goodie, MD  HYDROcodone-acetaminophen (NORCO) 5-325 MG tablet Take 1 tablet by mouth every 6 (six) hours as needed for severe pain. Patient not taking: Reported on 01/22/2017 01/17/17   Merrily Brittle, MD  hydrOXYzine (ATARAX/VISTARIL) 50 MG tablet Take 50 mg by mouth every 6 (six) hours as needed.     [provider]  lidocaine (XYLOCAINE) 5 % ointment Apply topically daily as needed (once daily for leg pain). Patient not taking: Reported on 01/22/2017 07/26/16   Pucilowska, Braulio Conte B, MD  loperamide (LOPERAMIDE A-D) 2 MG tablet Take 1 tablet (2 mg total) by mouth 4 (four) times daily as needed for diarrhea or loose stools. Patient not taking: Reported on 01/22/2017 07/07/16   Merrily Brittle, MD  ondansetron (ZOFRAN ODT) 4 MG disintegrating tablet Allow 1-2 tablets to dissolve in your mouth every 8 hours as needed for nausea/vomiting 03/01/17   Loleta Rose, MD  QUEtiapine (SEROQUEL) 100 MG tablet Take 1 tablet (100 mg total) by mouth at bedtime. Patient not taking: Reported on 01/22/2017 07/26/16   Pucilowska, Ellin Goodie, MD  QUEtiapine (SEROQUEL) 25 MG tablet Take 1 tablet (25 mg total) by mouth 3 (three) times daily as needed (anxiety). Patient not taking: Reported on 01/22/2017 07/26/16   Pucilowska, Ellin Goodie, MD  traMADol (ULTRAM) 50 MG tablet Take 1 tablet (50 mg total) every 6 (six) hours as needed by mouth. 03/01/17 03/01/18  Loleta Rose, MD  vitamin B-12 1000 MCG tablet Take 1 tablet (1,000 mcg total) by mouth daily. Patient not taking: Reported on 01/22/2017 07/26/16   Pucilowska, Braulio Conte B, MD  zolpidem (AMBIEN) 10 MG tablet Take 10 mg by mouth at bedtime as needed for sleep.    [provider]    Allergies Ciprofloxacin hcl; Fentanyl; Haldol [haloperidol]; Morphine and related; Propofol; and Vicodin [hydrocodone-acetaminophen]  Family History  Problem Relation Age of Onset  . Fibromyalgia Mother     Social History Social History   Tobacco Use  . Smoking  status: Current Every Day Smoker    Packs/day: 1.00    Years: 40.00    Pack years: 40.00    Types: Cigarettes  . Smokeless tobacco: Current User  Substance Use Topics  . Alcohol use: No  . Drug use: No    Review of Systems Constitutional: No fever/chills Eyes: No visual changes. ENT: No sore throat. Cardiovascular: Denies chest pain. Respiratory: Denies shortness of breath. Gastrointestinal: Acute on chronic abdominal pain worse on the left side.  No diarrhea.  Recent nausea and vomiting Genitourinary: Negative for dysuria. Musculoskeletal: Negative for neck pain.  Negative for back pain. Integumentary: Negative for rash. Neurological: Negative for headaches, focal weakness or numbness.   ____________________________________________   PHYSICAL EXAM:  VITAL SIGNS: ED Triage Vitals  Enc Vitals Group     BP 03/01/17 1400 135/86     Pulse Rate 03/01/17 1400 96  Resp 03/01/17 1400 20     Temp 03/01/17 1400 98.1 F (36.7 C)     Temp Source 03/01/17 1400 Oral     SpO2 03/01/17 1400 95 %     Weight --      Height --      Head Circumference --      Peak Flow --      Pain Score 03/01/17 1347 9     Pain Loc --      Pain Edu? --      Excl. in GC? --     Constitutional: Alert and oriented.  Upset with occasional crying, but is able to carry on a conversation with me.  Nontoxic appearance Eyes: Conjunctivae are normal.  Head: Atraumatic. Cardiovascular: Normal rate, regular rhythm. Good peripheral circulation. Grossly normal heart sounds. Respiratory: Normal respiratory effort.  No retractions. Lungs CTAB. Gastrointestinal: Soft with diffuse abdominal tenderness throughout, no rebound and no guarding Musculoskeletal: No lower extremity tenderness nor edema. No gross deformities of extremities. Neurologic:  Normal speech and language. No gross focal neurologic deficits are appreciated.  Skin:  Skin is warm, dry and intact. No rash noted. Psychiatric: Mood and affect are  upset and somewhat labile but generally appropriate for someone who is suffering from acute on chronic pain  ____________________________________________   LABS (all labs ordered are listed, but only abnormal results are displayed)  Labs Reviewed  COMPREHENSIVE METABOLIC PANEL - Abnormal; Notable for the following components:      Result Value   Glucose, Bld 134 (*)    ALT 11 (*)    All other components within normal limits  URINALYSIS, COMPLETE (UACMP) WITH MICROSCOPIC - Abnormal; Notable for the following components:   Color, Urine YELLOW (*)    APPearance CLEAR (*)    Squamous Epithelial / LPF 0-5 (*)    All other components within normal limits  LIPASE, BLOOD  CBC   ____________________________________________  EKG  ED ECG REPORT I, Geneve Kimpel, the attending physician, personally viewed and interpreted this ECG.  Date: 03/01/2017 EKG Time: 14: 08 Rate: 100 Rhythm: Borderline tachycardia QRS Axis: normal Intervals: normal ST/T Wave abnormalities: Non-specific ST segment / T-wave changes, but no evidence of acute ischemia. Narrative Interpretation: no evidence of acute ischemia.  More to recent prior EKGs   ____________________________________________  RADIOLOGY   Dg Abdomen Acute W/chest  Result Date: 03/01/2017 CLINICAL DATA:  51 year old female with increasing left lower quadrant abdominal pain. Unable to tolerate p.o. Nausea vomiting. EXAM: DG ABDOMEN ACUTE W/ 1V CHEST COMPARISON:  02/20/2017, CT Abdomen and Pelvis 01/17/2017 and earlier. FINDINGS: Stable lung volumes. Normal cardiac size and mediastinal contours. Visualized tracheal air column is within normal limits. No pneumothorax or pneumoperitoneum. Stable increased pulmonary interstitial markings with no acute pulmonary opacity. Non obstructed bowel gas pattern. Chronic left nephrolithiasis is stable since September. Numerous pelvic phleboliths appear stable. Other abdominal visceral contours are within  normal limits. No acute osseous abnormality identified. IMPRESSION: 1.  Normal bowel gas pattern, no free air. 2.  No acute cardiopulmonary abnormality. 3. Stable left nephrolithiasis. Electronically Signed   By: Odessa Fleming M.D.   On: 03/01/2017 16:14    ____________________________________________   PROCEDURES  Critical Care performed: No   Procedure(s) performed:   Procedures   ____________________________________________   INITIAL IMPRESSION / ASSESSMENT AND PLAN / ED COURSE  As part of my medical decision making, I reviewed the following data within the electronic MEDICAL RECORD NUMBER Nursing notes reviewed and incorporated, Labs reviewed ,  Old chart reviewed, Notes from prior ED visits and [place] Controlled Substance Database  I have seen the patient before for similar symptoms and she has had 5 visits to the emergency department in the last 6 months and numerous visits last year. Differential diagnosis includes, but is not limited to, ovarian cyst, ovarian torsion, acute appendicitis, diverticulitis, urinary tract infection/pyelonephritis, endometriosis, bowel obstruction, colitis, renal colic, gastroenteritis, hernia, fibroids, endometriosis, pregnancy related pain including ectopic pregnancy, etc. however, her workup is reassuring today with normal vital signs and lipase, CBC, metabolic panel, and urinalysis all within normal limits and no evidence of an acute or emergent process.  Her physical exam is reassuring as well with some diffuse tenderness but no peritonitis.  She has had 2 abdominal CT scans within the last 10 months and I do not want to reimage her and expose her to all that radiation if it is not necessary.  I have obtained plain radiographs to rule out free air (from a perforated ulcer, for example) and to rule out any obstructive process.  The radiologist reported that the results were encouraging.   I reviewed the patient's prescription history over the last 24 months in the  multi-state controlled substances database(s) that [place], [place], [place], [place], [place], [place], [place], [place], [place], [place], [place], [place], and [place].  Results were notable for numerous controlled substance prescriptions but she has not had any narcotics prescribed in about a month and that was tramadol which she states helped a little bit.  I had a long discussion with her about her symptoms and explained why I cannot prescribe narcotics but I am willing to give her a short course of tramadol to help her until she can see her gastroenterologist in 2 days.  I explained why do not feel there is an acute or emergent condition at this time.  I encouraged her to continue taking her regular medications and follow-up with her primary care doctor or gastroenterologist.  I also encouraged her to check with her PCP to see if he feels it would be appropriate to be referred to a pain clinic.  Moderate opiate dependence and she adamantly disagreed with that although I reviewed her medical records including a psychiatric visit for an intentional overdose.   I gave my usual and customary return precautions and she thanked me for taking the time to speak with her and listen to her concerns.  I believe that she will follow-up as an outpatient.   Clinical Course as of Mar 02 2131  Mon Mar 01, 2017  1658 DG Abdomen Acute W/Chest [CF]    Clinical Course User Index [CF] Loleta RoseForbach, Lenna Hagarty, MD    ____________________________________________  FINAL CLINICAL IMPRESSION(S) / ED DIAGNOSES  Final diagnoses:  Chronic abdominal pain     MEDICATIONS GIVEN DURING THIS VISIT:  Medications  traMADol (ULTRAM) tablet 100 mg (100 mg Oral Given 03/01/17 1745)  ondansetron (ZOFRAN-ODT) disintegrating tablet 4 mg (4 mg Oral Given 03/01/17 1746)       Note:  This document was prepared using Dragon voice recognition software and may include  unintentional dictation errors.    Loleta RoseForbach, Creola Krotz, MD 03/01/17 2132

## 2017-03-01 NOTE — ED Triage Notes (Signed)
Pt is currently being followed by Dr Raford PitcherBarrett for abdominal pain, pt reports pain to LLQ is worse, pt is unable to tolerate PO , Pt complains of nausea/vomiting

## 2017-03-04 ENCOUNTER — Ambulatory Visit
Admission: RE | Admit: 2017-03-04 | Discharge: 2017-03-04 | Disposition: A | Discharge status: Home / Self Care | Payer: Medicaid Other | Source: Ambulatory Visit | Attending: Internal Medicine | Admitting: Internal Medicine

## 2017-03-04 DIAGNOSIS — K219 Gastro-esophageal reflux disease without esophagitis: Secondary | ICD-10-CM | POA: Insufficient documentation

## 2017-03-04 DIAGNOSIS — K449 Diaphragmatic hernia without obstruction or gangrene: Secondary | ICD-10-CM | POA: Insufficient documentation

## 2017-03-04 DIAGNOSIS — K257 Chronic gastric ulcer without hemorrhage or perforation: Secondary | ICD-10-CM | POA: Diagnosis present

## 2017-03-08 ENCOUNTER — Emergency Department
Admission: EM | Admit: 2017-03-08 | Discharge: 2017-03-08 | Disposition: A | Discharge status: Home / Self Care | Payer: Medicaid Other | Attending: Emergency Medicine | Admitting: Emergency Medicine

## 2017-03-08 ENCOUNTER — Other Ambulatory Visit: Payer: Self-pay

## 2017-03-08 DIAGNOSIS — Z79899 Other long term (current) drug therapy: Secondary | ICD-10-CM | POA: Insufficient documentation

## 2017-03-08 DIAGNOSIS — Z9851 Tubal ligation status: Secondary | ICD-10-CM | POA: Insufficient documentation

## 2017-03-08 DIAGNOSIS — R531 Weakness: Secondary | ICD-10-CM | POA: Diagnosis not present

## 2017-03-08 DIAGNOSIS — Z90711 Acquired absence of uterus with remaining cervical stump: Secondary | ICD-10-CM | POA: Diagnosis not present

## 2017-03-08 DIAGNOSIS — F17210 Nicotine dependence, cigarettes, uncomplicated: Secondary | ICD-10-CM | POA: Diagnosis not present

## 2017-03-08 DIAGNOSIS — J45909 Unspecified asthma, uncomplicated: Secondary | ICD-10-CM | POA: Diagnosis not present

## 2017-03-08 DIAGNOSIS — R112 Nausea with vomiting, unspecified: Secondary | ICD-10-CM | POA: Diagnosis not present

## 2017-03-08 DIAGNOSIS — E349 Endocrine disorder, unspecified: Secondary | ICD-10-CM | POA: Diagnosis not present

## 2017-03-08 LAB — URINALYSIS, COMPLETE (UACMP) WITH MICROSCOPIC
Bilirubin Urine: NEGATIVE
Glucose, UA: NEGATIVE mg/dL
Hgb urine dipstick: NEGATIVE
KETONES UR: NEGATIVE mg/dL
LEUKOCYTES UA: NEGATIVE
Nitrite: NEGATIVE
PROTEIN: NEGATIVE mg/dL
Specific Gravity, Urine: 1.012 (ref 1.005–1.030)
pH: 8 (ref 5.0–8.0)

## 2017-03-08 LAB — BASIC METABOLIC PANEL
Anion gap: 12 (ref 5–15)
BUN: 7 mg/dL (ref 6–20)
CHLORIDE: 106 mmol/L (ref 101–111)
CO2: 20 mmol/L — AB (ref 22–32)
Calcium: 9.2 mg/dL (ref 8.9–10.3)
Creatinine, Ser: 0.7 mg/dL (ref 0.44–1.00)
GFR calc non Af Amer: >60 mL/min (ref >60)
Glucose, Bld: 154 mg/dL — ABNORMAL HIGH (ref 65–99)
POTASSIUM: 3.9 mmol/L (ref 3.5–5.1)
SODIUM: 138 mmol/L (ref 135–145)

## 2017-03-08 LAB — GLUCOSE, CAPILLARY: GLUCOSE-CAPILLARY: 138 mg/dL — AB (ref 65–99)

## 2017-03-08 LAB — CBC
HEMATOCRIT: 43.7 % (ref 35.0–47.0)
Hemoglobin: 14.5 g/dL (ref 12.0–16.0)
MCH: 31 pg (ref 26.0–34.0)
MCHC: 33.2 g/dL (ref 32.0–36.0)
MCV: 93.4 fL (ref 80.0–100.0)
Platelets: 298 10*3/uL (ref 150–440)
RBC: 4.68 MIL/uL (ref 3.80–5.20)
RDW: 13.5 % (ref 11.5–14.5)
WBC: 11.8 10*3/uL — AB (ref 3.6–11.0)

## 2017-03-08 LAB — HCG, QUANTITATIVE, PREGNANCY: hCG, Beta Chain, Quant, S: 10 m[IU]/mL — ABNORMAL HIGH (ref <5)

## 2017-03-08 MED ORDER — ONDANSETRON 4 MG PO TBDP
4.0000 mg | ORAL_TABLET | Freq: Once | ORAL | Status: AC
  Administered 2017-03-08: 4 mg via ORAL

## 2017-03-08 MED ORDER — ONDANSETRON 4 MG PO TBDP: ORAL_TABLET | ORAL | 0 refills | Status: DC

## 2017-03-08 MED ORDER — FAMOTIDINE IN NACL 20-0.9 MG/50ML-% IV SOLN
20.0000 mg | Freq: Once | INTRAVENOUS | Status: AC
  Administered 2017-03-08: 20 mg via INTRAVENOUS
  Filled 2017-03-08: qty 50

## 2017-03-08 MED ORDER — SODIUM CHLORIDE 0.9 % IV BOLUS (SEPSIS)
1000.0000 mL | Freq: Once | INTRAVENOUS | Status: AC
  Administered 2017-03-08: 1000 mL via INTRAVENOUS

## 2017-03-08 MED ORDER — ONDANSETRON HCL 4 MG/2ML IJ SOLN
4.0000 mg | Freq: Once | INTRAMUSCULAR | Status: AC
Start: 2017-03-08 — End: 2017-03-08
  Administered 2017-03-08: 4 mg via INTRAVENOUS
  Filled 2017-03-08: qty 2

## 2017-03-08 MED ORDER — ONDANSETRON 4 MG PO TBDP
ORAL_TABLET | ORAL | Status: AC
  Administered 2017-03-08: 4 mg via ORAL
  Filled 2017-03-08: qty 1

## 2017-03-08 NOTE — ED Triage Notes (Signed)
Pt c/o nausea, vomiting, fatigue and weakness X 3 months. States that she is having syncopal episodes at home. Pt took Zofran PTA with no relief.

## 2017-03-08 NOTE — ED Notes (Signed)
First Nurse: pt to ed via ems with reports of n/v/d, weakness. Called her pcp and was told to come to ED. Ems reports ALL VITALS NORMAL. nad noted of patient.

## 2017-03-08 NOTE — ED Notes (Signed)
Pt requesting nausea medication

## 2017-03-08 NOTE — ED Provider Notes (Signed)
[facility] Emergency Department Provider Note  ____________________________________________  Time seen: Approximately 3:10 PM  I have reviewed the triage vital signs and the nursing notes.   HISTORY  Chief Complaint Emesis and Weakness   HPI Shawna [name] is a 51 y.o. female with a history of GERD, hiatal hernia, chronic abdominal pain who presents for evaluation of nausea and vomiting. Patient reports for the last 3 days she has been unable to keep anything down. Every time she eats or drinks something she vomits. She has been dealing with issues similar to this one for the last 3 months. She has been seen by GI Dr. Norma Fredrickson with endoscopy and upper GI series in the last few months which showed mild gastritis with gastric ulcer, grade B esophagitis, normal stomach emptying study, small hiatal hernia, negative H. pylori. Patient denies abdominal pain. She denies fever, chills, diarrhea, constipation. She has been having normal bowel movements. She denies coffee ground emesis or hematemesis. No chest pain or shortness of breath, no URI symptoms, no dysuria or hematuria.  Past Medical History:  Diagnosis Date  . Asthma   . Bulging lumbar disc   . Bulging lumbar disc   . GERD (gastroesophageal reflux disease)   . Insomnia   . Kidney stone   . Pneumonia DEC 2016  . Sciatic leg pain    Right side  . Sciatica of right side   . Weakness    Bil knees, and ankles    Patient Active Problem List   Diagnosis Date Noted  . Colitis 01/22/2017  . Dyslipidemia 07/26/2016  . Low vitamin B12 level 07/26/2016  . Major depressive disorder, 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  . Overdose of sedative or hypnotic 07/23/2016  . COPD exacerbation (HCC) 07/23/2016  . GERD (gastroesophageal reflux disease) 07/23/2016  . Cannabis use disorder, moderate, 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  . Tobacco use disorder 07/23/2016  . Sedative, hypnotic or anxiolytic  use disorder, severe, 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  . Bacterial pneumonia 01/23/2015  . Leukocytosis 01/23/2015  . Sepsis (HCC) 01/23/2015  . Hypoxemia 01/23/2015  . Opioid use disorder, moderate, dependence (HCC) 12/31/2014  . Anxiety disorder, unspecified 12/31/2014    Past Surgical History:  Procedure Laterality Date  . ABDOMINAL HYSTERECTOMY     Partial  . SHOULDER SURGERY Right   . TONSILLECTOMY    . TUBAL LIGATION      Prior to Admission medications   Medication Sig Start Date End Date Taking? Authorizing Provider  acetaminophen (TYLENOL) 500 MG tablet Take 500 mg by mouth every 6 (six) hours as needed.    [provider]  albuterol (PROVENTIL HFA;VENTOLIN HFA) 108 (90 Base) MCG/ACT inhaler Inhale 2 puffs into the lungs every 6 (six) hours as needed for wheezing or shortness of breath. 01/16/16   Jennye Moccasin, MD  clonazePAM (KLONOPIN) 0.5 MG tablet Take 0.5 mg by mouth 2 (two) times daily as needed for anxiety.    [provider]  dexlansoprazole (DEXILANT) 60 MG capsule Take 1 capsule by mouth daily.    [provider]  dicyclomine (BENTYL) 10 MG capsule Take 10 mg by mouth 3 (three) times daily as needed.     [provider]  DULoxetine 40 MG CPEP Take 40 mg by mouth daily. 07/27/16   Pucilowska, Jolanta B, MD  fexofenadine (ALLEGRA) 180 MG tablet Take 180 mg by mouth daily.    [provider]  fluticasone (FLONASE) 50 MCG/ACT nasal spray Place 2 sprays  into both nostrils daily.     [provider]  gemfibrozil (LOPID) 600 MG tablet Take 1 tablet (600 mg total) by mouth 2 (two) times daily before a meal. Patient not taking: Reported on 01/22/2017 07/26/16   Pucilowska, Ellin Goodie, MD  HYDROcodone-acetaminophen (NORCO) 5-325 MG tablet Take 1 tablet by mouth every 6 (six) hours as needed for severe pain. Patient not taking: Reported on 01/22/2017 01/17/17   Merrily Brittle, MD  hydrOXYzine (ATARAX/VISTARIL) 50 MG tablet Take 50 mg  by mouth every 6 (six) hours as needed.     [provider]  lidocaine (XYLOCAINE) 5 % ointment Apply topically daily as needed (once daily for leg pain). Patient not taking: Reported on 01/22/2017 07/26/16   Pucilowska, Braulio Conte B, MD  loperamide (LOPERAMIDE A-D) 2 MG tablet Take 1 tablet (2 mg total) by mouth 4 (four) times daily as needed for diarrhea or loose stools. Patient not taking: Reported on 01/22/2017 07/07/16   Merrily Brittle, MD  ondansetron (ZOFRAN ODT) 4 MG disintegrating tablet Allow 1-2 tablets to dissolve in your mouth every 8 hours as needed for nausea/vomiting 03/08/17   Don Perking, [place], MD  QUEtiapine (SEROQUEL) 100 MG tablet Take 1 tablet (100 mg total) by mouth at bedtime. Patient not taking: Reported on 01/22/2017 07/26/16   Pucilowska, Ellin Goodie, MD  QUEtiapine (SEROQUEL) 25 MG tablet Take 1 tablet (25 mg total) by mouth 3 (three) times daily as needed (anxiety). Patient not taking: Reported on 01/22/2017 07/26/16   Pucilowska, Ellin Goodie, MD  traMADol (ULTRAM) 50 MG tablet Take 1 tablet (50 mg total) every 6 (six) hours as needed by mouth. 03/01/17 03/01/18  Loleta Rose, MD  vitamin B-12 1000 MCG tablet Take 1 tablet (1,000 mcg total) by mouth daily. Patient not taking: Reported on 01/22/2017 07/26/16   Pucilowska, Braulio Conte B, MD  zolpidem (AMBIEN) 10 MG tablet Take 10 mg by mouth at bedtime as needed for sleep.    [provider]    Allergies Ciprofloxacin hcl; Fentanyl; Haldol [haloperidol]; Morphine and related; Propofol; and Vicodin [hydrocodone-acetaminophen]  Family History  Problem Relation Age of Onset  . Fibromyalgia Mother     Social History Social History   Tobacco Use  . Smoking status: Current Every Day Smoker    Packs/day: 1.00    Years: 40.00    Pack years: 40.00    Types: Cigarettes  . Smokeless tobacco: Current User  Substance Use Topics  . Alcohol use: No  . Drug use: No    Review of Systems  Constitutional: Negative for  fever. Eyes: Negative for visual changes. ENT: Negative for sore throat. Neck: No neck pain  Cardiovascular: Negative for chest pain. Respiratory: Negative for shortness of breath. Gastrointestinal: Negative for abdominal pain,  Diarrhea. + N/V Genitourinary: Negative for dysuria. Musculoskeletal: Negative for back pain. Skin: Negative for rash. Neurological: Negative for headaches, weakness or numbness. Psych: No SI or HI  ____________________________________________   PHYSICAL EXAM:  VITAL SIGNS: ED Triage Vitals  Enc Vitals Group     BP 03/08/17 1129 123/82     Pulse Rate 03/08/17 1129 84     Resp 03/08/17 1129 20     Temp 03/08/17 1129 98.4 F (36.9 C)     Temp Source 03/08/17 1129 Oral     SpO2 03/08/17 1129 98 %     Weight 03/08/17 1129 148 lb (67.1 kg)     Height 03/08/17 1129 5\' 6"  (1.676 m)     Head Circumference --  Peak Flow --      Pain Score 03/08/17 1136 0     Pain Loc --      Pain Edu? --      Excl. in GC? --     Constitutional: Alert and oriented. Well appearing and in no apparent distress. HEENT:      Head: Normocephalic and atraumatic.         Eyes: Conjunctivae are normal. Sclera is non-icteric.       Mouth/Throat: Mucous membranes are moist.       Neck: Supple with no signs of meningismus. Cardiovascular: Regular rate and rhythm. No murmurs, gallops, or rubs. 2+ symmetrical distal pulses are present in all extremities. No JVD. Respiratory: Normal respiratory effort. Lungs are clear to auscultation bilaterally. No wheezes, crackles, or rhonchi.  Gastrointestinal: Soft, non tender, and non distended with positive bowel sounds. No rebound or guarding. Musculoskeletal: Nontender with normal range of motion in all extremities. No edema, cyanosis, or erythema of extremities. Neurologic: Normal speech and language. Face is symmetric. Moving all extremities. No gross focal neurologic deficits are appreciated. Skin: Skin is warm, dry and intact. No rash  noted. Psychiatric: Mood and affect are normal. Speech and behavior are normal.  ____________________________________________   LABS (all labs ordered are listed, but only abnormal results are displayed)  Labs Reviewed  BASIC METABOLIC PANEL - Abnormal; Notable for the following components:      Result Value   CO2 20 (*)    Glucose, Bld 154 (*)    All other components within normal limits  CBC - Abnormal; Notable for the following components:   WBC 11.8 (*)    All other components within normal limits  URINALYSIS, COMPLETE (UACMP) WITH MICROSCOPIC - Abnormal; Notable for the following components:   Color, Urine YELLOW (*)    APPearance CLEAR (*)    Bacteria, UA RARE (*)    Squamous Epithelial / LPF 0-5 (*)    All other components within normal limits  GLUCOSE, CAPILLARY - Abnormal; Notable for the following components:   Glucose-Capillary 138 (*)    All other components within normal limits  HCG, QUANTITATIVE, PREGNANCY - Abnormal; Notable for the following components:   hCG, Beta Chain, Quant, S 10 (*)    All other components within normal limits  CBG MONITORING, ED   ____________________________________________  EKG  ED ECG REPORT I, Nita Sicklearolina Jasemine Nawaz, the attending physician, personally viewed and interpreted this ECG.  Normal sinus rhythm, rate of 84, normal intervals, normal axis, no ST elevations or depressions, T-wave inversions in inferior and lateral leads. Unchanged from prior from a week ago. ____________________________________________  RADIOLOGY  none ____________________________________________   PROCEDURES  Procedure(s) performed: None Procedures Critical Care performed:  None ____________________________________________   INITIAL IMPRESSION / ASSESSMENT AND PLAN / ED COURSE  51 y.o. female with a history of GERD, hiatal hernia, chronic abdominal pain who presents for evaluation of nausea and vomiting x 3 days. Patient with chronic abdominal  pain, N/V now with exacerbation of her symptoms for the last 3 days. No abdominal pain. EKG with no ischemic changes. Abdomen is soft with no tenderness throughout. Labs are within normal limits. Vitals within normal limits. At this time do not believe patient has any acute intra-abdominal pathology requiring imaging. I will give her IV Pepcid, Zofran, and fluids. We'll check UA for any signs of dehydration.    _________________________ 4:13 PM on 03/08/2017 -----------------------------------------  Labs with no AG, no ketones, normal electrolytes, and creatinine and no  evidence of dehydration. Patient with no vomiting in the ED. At this time, with normal exam, normal vitals, normal labs I do believe patient is safe for discharge and continue management as an outpatient. We'll refer patient back to her primary care doctor and GI doctor. She is going to be given a prescription for Zofran. Of note patient's labs showed slightly elevated hCG. Patient is s/p hysterectomy and tubal ligation. Unclear etiology for this finding but will refer patient to OBGYN for further management. Discussed my standard and customary return precautions with patient.   As part of my medical decision making, I reviewed the following data within the electronic MEDICAL RECORD NUMBER Nursing notes reviewed and incorporated, Labs reviewed , EKG interpreted , Old EKG reviewed, Notes from prior ED visits and [place] Controlled Substance Database    Pertinent labs & imaging results that were available during my care of the patient were reviewed by me and considered in my medical decision making (see chart for details).    ____________________________________________   FINAL CLINICAL IMPRESSION(S) / ED DIAGNOSES  Final diagnoses:  Generalized weakness  Non-intractable vomiting with nausea, unspecified vomiting type  Elevated serum hCG      NEW MEDICATIONS STARTED DURING THIS VISIT:  This SmartLink is deprecated. Use AVSMEDLIST  instead to display the medication list for a patient.   Note:  This document was prepared using Dragon voice recognition software and may include unintentional dictation errors.    Nita SickleVeronese, [place], MD 03/08/17 [phone]

## 2017-03-08 NOTE — Discharge Instructions (Addendum)
Please follow up with ObGYN for further evaluation of abnormal Hcg hormone in your blood.

## 2017-07-30 ENCOUNTER — Emergency Department
Admission: EM | Admit: 2017-07-30 | Discharge: 2017-07-30 | Disposition: A | Discharge status: Home / Self Care | Payer: Medicaid Other | Attending: Emergency Medicine | Admitting: Emergency Medicine

## 2017-07-30 ENCOUNTER — Encounter: Payer: Self-pay | Admitting: Emergency Medicine

## 2017-07-30 ENCOUNTER — Other Ambulatory Visit: Payer: Self-pay

## 2017-07-30 DIAGNOSIS — Z79899 Other long term (current) drug therapy: Secondary | ICD-10-CM | POA: Insufficient documentation

## 2017-07-30 DIAGNOSIS — F17210 Nicotine dependence, cigarettes, uncomplicated: Secondary | ICD-10-CM | POA: Insufficient documentation

## 2017-07-30 DIAGNOSIS — J45909 Unspecified asthma, uncomplicated: Secondary | ICD-10-CM | POA: Diagnosis not present

## 2017-07-30 DIAGNOSIS — F1320 Sedative, hypnotic or anxiolytic dependence, uncomplicated: Secondary | ICD-10-CM | POA: Diagnosis present

## 2017-07-30 DIAGNOSIS — F29 Unspecified psychosis not due to a substance or known physiological condition: Secondary | ICD-10-CM | POA: Insufficient documentation

## 2017-07-30 DIAGNOSIS — F332 Major depressive disorder, recurrent severe without psychotic features: Secondary | ICD-10-CM | POA: Diagnosis present

## 2017-07-30 DIAGNOSIS — R441 Visual hallucinations: Secondary | ICD-10-CM | POA: Diagnosis present

## 2017-07-30 DIAGNOSIS — F1220 Cannabis dependence, uncomplicated: Secondary | ICD-10-CM | POA: Diagnosis present

## 2017-07-30 HISTORY — DX: Anxiety disorder, unspecified: F41.9

## 2017-07-30 LAB — CBC
HCT: 43.3 % (ref 35.0–47.0)
HEMOGLOBIN: 14.5 g/dL (ref 12.0–16.0)
MCH: 31.3 pg (ref 26.0–34.0)
MCHC: 33.5 g/dL (ref 32.0–36.0)
MCV: 93.4 fL (ref 80.0–100.0)
PLATELETS: 399 10*3/uL (ref 150–440)
RBC: 4.63 MIL/uL (ref 3.80–5.20)
RDW: 12.7 % (ref 11.5–14.5)
WBC: 9.7 10*3/uL (ref 3.6–11.0)

## 2017-07-30 LAB — COMPREHENSIVE METABOLIC PANEL
ALBUMIN: 4.5 g/dL (ref 3.5–5.0)
ALT: 10 U/L — AB (ref 14–54)
AST: 17 U/L (ref 15–41)
Alkaline Phosphatase: 95 U/L (ref 38–126)
Anion gap: 9 (ref 5–15)
BUN: 11 mg/dL (ref 6–20)
CO2: 23 mmol/L (ref 22–32)
CREATININE: 0.79 mg/dL (ref 0.44–1.00)
Calcium: 9.2 mg/dL (ref 8.9–10.3)
Chloride: 107 mmol/L (ref 101–111)
GFR calc Af Amer: >60 mL/min (ref >60)
GFR calc non Af Amer: >60 mL/min (ref >60)
GLUCOSE: 120 mg/dL — AB (ref 65–99)
POTASSIUM: 3.8 mmol/L (ref 3.5–5.1)
SODIUM: 139 mmol/L (ref 135–145)
Total Bilirubin: 0.7 mg/dL (ref 0.3–1.2)
Total Protein: 7.6 g/dL (ref 6.5–8.1)

## 2017-07-30 LAB — URINE DRUG SCREEN, QUALITATIVE (ARMC ONLY)
AMPHETAMINES, UR SCREEN: NOT DETECTED
Barbiturates, Ur Screen: NOT DETECTED
Benzodiazepine, Ur Scrn: POSITIVE — AB
COCAINE METABOLITE, UR ~~LOC~~: NOT DETECTED
Cannabinoid 50 Ng, Ur ~~LOC~~: POSITIVE — AB
MDMA (ECSTASY) UR SCREEN: NOT DETECTED
Methadone Scn, Ur: NOT DETECTED
Opiate, Ur Screen: NOT DETECTED
PHENCYCLIDINE (PCP) UR S: NOT DETECTED
Tricyclic, Ur Screen: NOT DETECTED

## 2017-07-30 LAB — SALICYLATE LEVEL: Salicylate Lvl: <7 mg/dL (ref 2.8–30.0)

## 2017-07-30 LAB — ETHANOL: Alcohol, Ethyl (B): <10 mg/dL (ref <10)

## 2017-07-30 LAB — ACETAMINOPHEN LEVEL: Acetaminophen (Tylenol), Serum: <10 ug/mL — ABNORMAL LOW (ref 10–30)

## 2017-07-30 MED ORDER — RISPERIDONE 0.5 MG PO TBDP
0.5000 mg | ORAL_TABLET | Freq: Once | ORAL | Status: DC
  Filled 2017-07-30: qty 1

## 2017-07-30 NOTE — ED Notes (Signed)
Patient visiting with family

## 2017-07-30 NOTE — ED Notes (Signed)
PT VOLUNTARY/SEEN BY DR CLAPACS-PT DOES NOT MEET CRITERIA FOR INPATIENT ADMISSION. 

## 2017-07-30 NOTE — Consult Note (Signed)
[facility] Consult   Reason for Consult: Consult for 52 year old woman who came voluntarily to the emergency room saying she was having visual hallucinations Referring Physician: Clearnce Hasten Patient Identification: AKYA FIORELLO MRN:  [id number] Principal Diagnosis: Visual hallucinations Diagnosis:   Patient Active Problem List   Diagnosis Date Noted  . Visual hallucinations [R44.1] 07/30/2017  . Colitis [K52.9] 01/22/2017  . Dyslipidemia [E78.5] 07/26/2016  . Low vitamin B12 level [E53.8] 07/26/2016  . Major depressive disorder, recurrent severe without psychotic features ([place]) [F33.2] 07/23/2016  . Overdose of sedative or hypnotic [T42.71XA] 07/23/2016  . COPD exacerbation ([place]) [J44.1] 07/23/2016  . GERD (gastroesophageal reflux disease) [K21.9] 07/23/2016  . Cannabis use disorder, moderate, dependence ([place]) [F12.20] 07/23/2016  . Tobacco use disorder [F17.200] 07/23/2016  . Sedative, hypnotic or anxiolytic use disorder, severe, dependence ([place]) [F13.20] 07/23/2016  . Bacterial pneumonia [J15.9] 01/23/2015  . Leukocytosis [D72.829] 01/23/2015  . Sepsis ([place]) [A41.9] 01/23/2015  . Hypoxemia [R09.02] 01/23/2015  . Opioid use disorder, moderate, dependence ([place]) [F11.20] 12/31/2014  . Anxiety disorder, unspecified [F41.9] 12/31/2014    Total Time spent with patient: 1 hour  Subjective:   DAMISHA WOLFF is a 52 y.o. female patient admitted with "I was seeing my dead uncle".  HPI: Patient interviewed chart reviewed.  Patient came voluntarily to the emergency room states that she is having visual hallucinations.  She says the symptoms began today when she was at home.  She was seeing a vision of her late uncle in her house.  She says there were also times this morning when she was seeing other people she did not recognize.  She realized that they were hallucinations but found it frightening.  Called her daughter to bring her to the emergency room.  When being seen in triage  the patient was again claiming to see her late uncle.  During the interview I had with her she did not report active hallucinations.  Patient claims she had not been having this symptom yesterday.  She is absolutely certain that she blames it on "tapering my Remeron".  Apparently her outpatient psychiatrist had prescribed 15 mg of mirtazapine at night for her and she has been taking it for the last 2 months without any difficulty.  She asked her primary care doctor to help her get off of it and he told her to take it on alternate nights.  When she talked with her psychiatrist however he recommended that she go back on it nightly and that they do a different kind of taper although the patient has not actually begun that taper.  I pointed out to her that therefore there is no acute change in her mirtazapine use hence there is no reason to think that that is the cause of her symptoms.  He remains however very focused on this.  Patient is quite clear that she is not suicidal or homicidal.  Quite clear that she is not delusional and does not believe that the visions are anything real.  She claims she has not abused any drugs.  I asked her if she had been taking any extra amounts of her medicine which launched her into telling me about the recent bed bug infestation at her house.  It seemed like she was going to then use this to justify taking extra hydroxyzine but then backed off of it and claimed that she was only taking her usual dose.  Drug screen positive for cannabis and benzodiazepines.  She says the only benzodiazepine she  is prescribed is clonazepam which usually does not show up in the drug screen.  Social history: Lives by herself.  Has an adult daughter who lives nearby who is her closest relative.  Medical history: COPD.  GI complaints.  She is on Bentyl and a couple of inhalers.  Substance abuse history: Drug screen positive for cannabis and benzodiazepines which suggest not only cannabis abuse but  probably use of extra benzodiazepines similar to what was found previously.  Past Psychiatric History: Patient had one previous hospitalization almost exactly a year ago at our facility.  At that time she had clearly overdosed on her prescription medicine.  This time however the patient denies any intentional overdose any suicidal thought any psychosis.  She says she is normally followed up with Dr. Collie Siad in the community.  Risk to Self: Is patient at risk for suicide?: No Risk to Others:   Prior Inpatient Therapy:   Prior Outpatient Therapy:    Past Medical History:  Past Medical History:  Diagnosis Date  . Anxiety   . Asthma   . Bulging lumbar disc   . Bulging lumbar disc   . GERD (gastroesophageal reflux disease)   . Insomnia   . Kidney stone   . Pneumonia DEC 2016  . Sciatic leg pain    Right side  . Sciatica of right side   . Weakness    Bil knees, and ankles    Past Surgical History:  Procedure Laterality Date  . ABDOMINAL HYSTERECTOMY     Partial  . ESOPHAGOGASTRODUODENOSCOPY (EGD) WITH PROPOFOL N/A 11/27/2015   Procedure: ESOPHAGOGASTRODUODENOSCOPY (EGD) WITH PROPOFOL;  Surgeon: Lollie Sails, MD;  Location: [facility] ENDOSCOPY;  Service: Endoscopy;  Laterality: N/A;  . SHOULDER SURGERY Right   . TONSILLECTOMY    . TUBAL LIGATION     Family History:  Family History  Problem Relation Age of Onset  . Fibromyalgia Mother    Family Psychiatric  History: Does not know of any Social History:  Social History   Substance and Sexual Activity  Alcohol Use No     Social History   Substance and Sexual Activity  Drug Use No    Social History   Socioeconomic History  . Marital status: Single    Spouse name: Not on file  . Number of children: Not on file  . Years of education: Not on file  . Highest education level: Not on file  Occupational History  . Not on file  Social Needs  . Financial resource strain: Not on file  . Food insecurity:    Worry: Not on file     Inability: Not on file  . Transportation needs:    Medical: Not on file    Non-medical: Not on file  Tobacco Use  . Smoking status: Current Every Day Smoker    Packs/day: 1.00    Years: 40.00    Pack years: 40.00    Types: Cigarettes  . Smokeless tobacco: Current User  Substance and Sexual Activity  . Alcohol use: No  . Drug use: No  . Sexual activity: Never  Lifestyle  . Physical activity:    Days per week: Not on file    Minutes per session: Not on file  . Stress: Not on file  Relationships  . Social connections:    Talks on phone: Not on file    Gets together: Not on file    Attends religious service: Not on file    Active member of club or  organization: Not on file    Attends meetings of clubs or organizations: Not on file    Relationship status: Not on file  Other Topics Concern  . Not on file  Social History Narrative  . Not on file   Additional Social History:    Allergies:   Allergies  Allergen Reactions  . Ciprofloxacin Hcl Nausea Only  . Fentanyl Nausea And Vomiting and Other (See Comments)    Patient states this makes her have severe flu symptoms.  . Haldol [Haloperidol]     Made pt "feel really weird".  . Morphine And Related Nausea And Vomiting    Patient states this medication makes her have sever flu symptoms.  . Propofol   . Vicodin [Hydrocodone-Acetaminophen] Nausea And Vomiting    Labs:  Results for orders placed or performed during the hospital encounter of 07/30/17 (from the past 48 hour(s))  Comprehensive metabolic panel     Status: Abnormal   Collection Time: 07/30/17  1:54 PM  Result Value Ref Range   Sodium 139 135 - 145 mmol/L   Potassium 3.8 3.5 - 5.1 mmol/L   Chloride 107 101 - 111 mmol/L   CO2 23 22 - 32 mmol/L   Glucose, Bld 120 (H) 65 - 99 mg/dL   BUN 11 6 - 20 mg/dL   Creatinine, Ser 0.79 0.44 - 1.00 mg/dL   Calcium 9.2 8.9 - 10.3 mg/dL   Total Protein 7.6 6.5 - 8.1 g/dL   Albumin 4.5 3.5 - 5.0 g/dL   AST 17 15 - 41 U/L    ALT 10 (L) 14 - 54 U/L   Alkaline Phosphatase 95 38 - 126 U/L   Total Bilirubin 0.7 0.3 - 1.2 mg/dL   GFR calc non Af Amer >60 >60 mL/min   GFR calc Af Amer >60 >60 mL/min    Comment: (NOTE) The eGFR has been calculated using the CKD EPI equation. This calculation has not been validated in all clinical situations. eGFR's persistently <60 mL/min signify possible Chronic Kidney Disease.    Anion gap 9 5 - 15    Comment: Performed at [facility], [address] [place], [place] [address]  Ethanol     Status: None   Collection Time: 07/30/17  1:54 PM  Result Value Ref Range   Alcohol, Ethyl (B) <10 <10 mg/dL    Comment:        LOWEST DETECTABLE LIMIT FOR SERUM ALCOHOL IS 10 mg/dL FOR MEDICAL PURPOSES ONLY Performed at [facility], [address] [place], [place] [address]   Salicylate level     Status: None   Collection Time: 07/30/17  1:54 PM  Result Value Ref Range   Salicylate Lvl <0.2 2.8 - 30.0 mg/dL    Comment: Performed at [facility], [address] [place], [place] [address]  Acetaminophen level     Status: Abnormal   Collection Time: 07/30/17  1:54 PM  Result Value Ref Range   Acetaminophen (Tylenol), Serum <10 (L) 10 - 30 ug/mL    Comment:        THERAPEUTIC CONCENTRATIONS VARY SIGNIFICANTLY. A RANGE OF 10-30 ug/mL MAY BE AN EFFECTIVE CONCENTRATION FOR MANY PATIENTS. HOWEVER, SOME ARE BEST TREATED AT CONCENTRATIONS OUTSIDE THIS RANGE. ACETAMINOPHEN CONCENTRATIONS >150 ug/mL AT 4 HOURS AFTER INGESTION AND >50 ug/mL AT 12 HOURS AFTER INGESTION ARE OFTEN ASSOCIATED WITH TOXIC REACTIONS. Performed at [facility], [address] [place], [place] [address]   cbc     Status:  None   Collection Time: 07/30/17  1:54 PM  Result Value Ref Range   WBC 9.7 3.6 - 11.0 K/uL   RBC 4.63 3.80 - 5.20 MIL/uL   Hemoglobin 14.5 12.0 - 16.0 g/dL   HCT 43.3 35.0 - 47.0 %   MCV 93.4 80.0 - 100.0 fL   MCH 31.3 26.0 - 34.0 pg    MCHC 33.5 32.0 - 36.0 g/dL   RDW 12.7 11.5 - 14.5 %   Platelets 399 150 - 440 K/uL    Comment: Performed at [facility], [address] [place], [place] [address]  Urine Drug Screen, Qualitative     Status: Abnormal   Collection Time: 07/30/17  1:54 PM  Result Value Ref Range   Tricyclic, Ur Screen NONE DETECTED NONE DETECTED   Amphetamines, Ur Screen NONE DETECTED NONE DETECTED   MDMA (Ecstasy)Ur Screen NONE DETECTED NONE DETECTED   Cocaine Metabolite,Ur [place] NONE DETECTED NONE DETECTED   Opiate, Ur Screen NONE DETECTED NONE DETECTED   Phencyclidine (PCP) Ur S NONE DETECTED NONE DETECTED   Cannabinoid 50 Ng, Ur [place] POSITIVE (A) NONE DETECTED   Barbiturates, Ur Screen NONE DETECTED NONE DETECTED   Benzodiazepine, Ur Scrn POSITIVE (A) NONE DETECTED   Methadone Scn, Ur NONE DETECTED NONE DETECTED    Comment: (NOTE) Tricyclics + metabolites, urine    Cutoff 1000 ng/mL Amphetamines + metabolites, urine  Cutoff 1000 ng/mL MDMA (Ecstasy), urine              Cutoff 500 ng/mL Cocaine Metabolite, urine          Cutoff 300 ng/mL Opiate + metabolites, urine        Cutoff 300 ng/mL Phencyclidine (PCP), urine         Cutoff 25 ng/mL Cannabinoid, urine                 Cutoff 50 ng/mL Barbiturates + metabolites, urine  Cutoff 200 ng/mL Benzodiazepine, urine              Cutoff 200 ng/mL Methadone, urine                   Cutoff 300 ng/mL The urine drug screen provides only a preliminary, unconfirmed analytical test result and should not be used for non-medical purposes. Clinical consideration and professional judgment should be applied to any positive drug screen result due to possible interfering substances. A more specific alternate chemical method must be used in order to obtain a confirmed analytical result. Gas chromatography / mass spectrometry (GC/MS) is the preferred confirmat ory method. Performed at [facility], [address] [place], [place] [address]      Current Facility-Administered Medications  Medication Dose Route Frequency Provider Last Rate Last Dose  . risperiDONE (RISPERDAL M-TABS) disintegrating tablet 0.5 mg  0.5 mg Oral Once Omarion Minnehan, Madie Reno, MD       Current Outpatient Medications  Medication Sig Dispense Refill  . acetaminophen (TYLENOL) 500 MG tablet Take 500 mg by mouth every 6 (six) hours as needed.    Marland Kitchen albuterol (PROVENTIL HFA;VENTOLIN HFA) 108 (90 Base) MCG/ACT inhaler Inhale 2 puffs into the lungs every 6 (six) hours as needed for wheezing or shortness of breath. 1 Inhaler 2  . clonazePAM (KLONOPIN) 0.5 MG tablet Take 0.5 mg by mouth 2 (two) times daily as needed for anxiety.    Marland Kitchen dexlansoprazole (DEXILANT) 60 MG capsule Take 1 capsule by mouth daily.    Marland Kitchen dicyclomine (BENTYL) 10 MG  capsule Take 10 mg by mouth 3 (three) times daily as needed.     . DULoxetine 40 MG CPEP Take 40 mg by mouth daily. 30 capsule 1  . fexofenadine (ALLEGRA) 180 MG tablet Take 180 mg by mouth daily.    . fluticasone (FLONASE) 50 MCG/ACT nasal spray Place 2 sprays into both nostrils daily.     Marland Kitchen gemfibrozil (LOPID) 600 MG tablet Take 1 tablet (600 mg total) by mouth 2 (two) times daily before a meal. (Patient not taking: Reported on 01/22/2017) 60 tablet 1  . HYDROcodone-acetaminophen (NORCO) 5-325 MG tablet Take 1 tablet by mouth every 6 (six) hours as needed for severe pain. (Patient not taking: Reported on 01/22/2017) 15 tablet 0  . hydrOXYzine (ATARAX/VISTARIL) 50 MG tablet Take 50 mg by mouth every 6 (six) hours as needed.     . lidocaine (XYLOCAINE) 5 % ointment Apply topically daily as needed (once daily for leg pain). (Patient not taking: Reported on 01/22/2017) 35.44 g 1  . loperamide (LOPERAMIDE A-D) 2 MG tablet Take 1 tablet (2 mg total) by mouth 4 (four) times daily as needed for diarrhea or loose stools. (Patient not taking: Reported on 01/22/2017) 30 tablet 0  . ondansetron (ZOFRAN ODT) 4 MG disintegrating tablet Allow 1-2 tablets to  dissolve in your mouth every 8 hours as needed for nausea/vomiting 30 tablet 0  . QUEtiapine (SEROQUEL) 100 MG tablet Take 1 tablet (100 mg total) by mouth at bedtime. (Patient not taking: Reported on 01/22/2017) 30 tablet 1  . QUEtiapine (SEROQUEL) 25 MG tablet Take 1 tablet (25 mg total) by mouth 3 (three) times daily as needed (anxiety). (Patient not taking: Reported on 01/22/2017) 90 tablet 1  . traMADol (ULTRAM) 50 MG tablet Take 1 tablet (50 mg total) every 6 (six) hours as needed by mouth. 20 tablet 0  . vitamin B-12 1000 MCG tablet Take 1 tablet (1,000 mcg total) by mouth daily. (Patient not taking: Reported on 01/22/2017) 30 tablet 1  . zolpidem (AMBIEN) 10 MG tablet Take 10 mg by mouth at bedtime as needed for sleep.      Musculoskeletal: Strength & Muscle Tone: within normal limits Gait & Station: normal Patient leans: N/A  Psychiatric Specialty Exam: Physical Exam  Nursing note and vitals reviewed. Constitutional: She appears well-developed and well-nourished.  HENT:  Head: Normocephalic and atraumatic.  Eyes: Pupils are equal, round, and reactive to light. Conjunctivae are normal.  Neck: Normal range of motion.  Cardiovascular: Regular rhythm and normal heart sounds.  Respiratory: Effort normal. No respiratory distress.  GI: Soft.  Musculoskeletal: Normal range of motion.  Neurological: She is alert.  Skin: Skin is warm and dry.  Psychiatric: Her speech is normal and behavior is normal. Thought content normal. Her mood appears anxious. Cognition and memory are normal. She expresses impulsivity.    Review of Systems  Constitutional: Negative.   HENT: Negative.   Eyes: Negative.   Respiratory: Negative.   Cardiovascular: Negative.   Gastrointestinal: Negative.   Musculoskeletal: Negative.   Skin: Negative.   Neurological: Negative.   Psychiatric/Behavioral: Positive for hallucinations. Negative for depression, memory loss, substance abuse and suicidal ideas. The patient  is not nervous/anxious and does not have insomnia.     Blood pressure 123/79, pulse 90, temperature 98.6 F (37 C), temperature source Oral, resp. rate 18, height 5' 6"  (1.676 m), weight 68 kg (150 lb), SpO2 95 %.Body mass index is 24.21 kg/m.  General Appearance: Casual  Eye Contact:  Good  Speech:  Clear and Coherent  Volume:  Normal  Mood:  Anxious  Affect:  Congruent  Thought Process:  Goal Directed  Orientation:  Full (Time, Place, and Person)  Thought Content:  Logical  Suicidal Thoughts:  No  Homicidal Thoughts:  No  Memory:  Immediate;   Fair Recent;   Fair Remote;   Fair  Judgement:  Fair  Insight:  Fair  Psychomotor Activity:  Decreased  Concentration:  Concentration: Fair  Recall:  [employer] of Knowledge:  Fair  Language:  Fair  Akathisia:  No  Handed:  Right  AIMS (if indicated):     Assets:  Desire for Improvement Housing Physical Health Social Support  ADL's:  Intact  Cognition:  WNL  Sleep:        Treatment Plan Summary: Daily contact with patient to assess and evaluate symptoms and progress in treatment, Medication management and Plan The patient is reporting sudden onset of visual hallucinations today without any accompanying major mood disorder or other psychotic symptoms.  On interview she does not present as confused or delirious at all.  Patient is adamant that she thinks the symptoms were from "tapering" her mirtazapine even though as I pointed out to her this is not what happened.  I think by far the most likely cause of these visual hallucinations would be her prescription medicine namely the Bentyl and hydroxyzine both of which can lead to anticholinergic delirium and higher doses.  Additionally I suspect she has been using extra benzodiazepines and obviously uses cannabis.  Patient does not appear to be a danger to herself or others.  Educated patient about the situation.  Suggest that we give her a very small dose of Risperdal and then she can be  discharged back home to follow-up with her outpatient psychiatrist.  The acute symptoms appear to be wearing off at this point.  Case reviewed with emergency room physician and TTS.  Disposition: No evidence of imminent risk to self or others at present.   Patient does not meet criteria for psychiatric inpatient admission. Supportive therapy provided about ongoing stressors.  Alethia Berthold, MD 07/30/2017 5:00 PM

## 2017-07-30 NOTE — ED Notes (Signed)
BEHAVIORAL HEALTH ROUNDING Patient sleeping: No. Patient alert and oriented: yes Behavior appropriate: Yes.  ; If no, describe:  Nutrition and fluids offered: yes Toileting and hygiene offered: Yes  Sitter present: q15 minute observations and security monitoring Law enforcement present: Yes    

## 2017-07-30 NOTE — ED Provider Notes (Signed)
[facility] Emergency Department Provider Note  ___________________________________________   First MD Initiated Contact with Patient 07/30/17 1502     (approximate)  I have reviewed the triage vital signs and the nursing notes.   HISTORY  Chief Complaint Hallucinations   HPI Shawna [name] is a 52 y.o. female with a history of anxiety as well as major depressive disorder who is presenting to the emergency department today saying that she is seeing people who are dead.  She says that she last hallucinated just as she arrived to the emergency department when she saw a dead uncle.  However, she denies any suicidal ideation or homicidal ideation.  Denying any drug use at this time.  Says that she is no longer hallucinating.  Says that she thinks that this episode may be a response or trying to wean herself from Remeron.  Past Medical History:  Diagnosis Date  . Anxiety   . Asthma   . Bulging lumbar disc   . Bulging lumbar disc   . GERD (gastroesophageal reflux disease)   . Insomnia   . Kidney stone   . Pneumonia DEC 2016  . Sciatic leg pain    Right side  . Sciatica of right side   . Weakness    Bil knees, and ankles    Patient Active Problem List   Diagnosis Date Noted  . Colitis 01/22/2017  . Dyslipidemia 07/26/2016  . Low vitamin B12 level 07/26/2016  . Major depressive disorder, 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  . Overdose of sedative or hypnotic 07/23/2016  . COPD exacerbation (HCC) 07/23/2016  . GERD (gastroesophageal reflux disease) 07/23/2016  . Cannabis use disorder, moderate, 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  . Tobacco use disorder 07/23/2016  . Sedative, hypnotic or anxiolytic use disorder, severe, 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  . Bacterial pneumonia 01/23/2015  . Leukocytosis 01/23/2015  . Sepsis (HCC) 01/23/2015  . Hypoxemia 01/23/2015  . Opioid use disorder, moderate, dependence (HCC) 12/31/2014  . Anxiety  disorder, unspecified 12/31/2014    Past Surgical History:  Procedure Laterality Date  . ABDOMINAL HYSTERECTOMY     Partial  . ESOPHAGOGASTRODUODENOSCOPY (EGD) WITH PROPOFOL N/A 11/27/2015   Procedure: ESOPHAGOGASTRODUODENOSCOPY (EGD) WITH PROPOFOL;  Surgeon: Christena DeemMartin U Skulskie, MD;  [facility];  Service: Endoscopy;  Laterality: N/A;  . SHOULDER SURGERY Right   . TONSILLECTOMY    . TUBAL LIGATION      Prior to Admission medications   Medication Sig Start Date End Date Taking? Authorizing Provider  acetaminophen (TYLENOL) 500 MG tablet Take 500 mg by mouth every 6 (six) hours as needed.    [provider]  albuterol (PROVENTIL HFA;VENTOLIN HFA) 108 (90 Base) MCG/ACT inhaler Inhale 2 puffs into the lungs every 6 (six) hours as needed for wheezing or shortness of breath. 01/16/16   Jennye MoccasinQuigley, Brian S, MD  clonazePAM (KLONOPIN) 0.5 MG tablet Take 0.5 mg by mouth 2 (two) times daily as needed for anxiety.    [provider]  dexlansoprazole (DEXILANT) 60 MG capsule Take 1 capsule by mouth daily.    [provider]  dicyclomine (BENTYL) 10 MG capsule Take 10 mg by mouth 3 (three) times daily as needed.     [provider]  DULoxetine 40 MG CPEP Take 40 mg by mouth daily. 07/27/16   Pucilowska, Jolanta B, MD  fexofenadine (ALLEGRA) 180 MG tablet Take 180 mg by mouth daily.    [provider]  fluticasone (FLONASE) 50 MCG/ACT nasal spray Place  2 sprays into both nostrils daily.     [provider]  gemfibrozil (LOPID) 600 MG tablet Take 1 tablet (600 mg total) by mouth 2 (two) times daily before a meal. Patient not taking: Reported on 01/22/2017 07/26/16   Pucilowska, Ellin Goodie, MD  HYDROcodone-acetaminophen (NORCO) 5-325 MG tablet Take 1 tablet by mouth every 6 (six) hours as needed for severe pain. Patient not taking: Reported on 01/22/2017 01/17/17   Merrily Brittle, MD  hydrOXYzine (ATARAX/VISTARIL) 50 MG tablet Take 50 mg by mouth every  6 (six) hours as needed.     [provider]  lidocaine (XYLOCAINE) 5 % ointment Apply topically daily as needed (once daily for leg pain). Patient not taking: Reported on 01/22/2017 07/26/16   Pucilowska, Braulio Conte B, MD  loperamide (LOPERAMIDE A-D) 2 MG tablet Take 1 tablet (2 mg total) by mouth 4 (four) times daily as needed for diarrhea or loose stools. Patient not taking: Reported on 01/22/2017 07/07/16   Merrily Brittle, MD  ondansetron (ZOFRAN ODT) 4 MG disintegrating tablet Allow 1-2 tablets to dissolve in your mouth every 8 hours as needed for nausea/vomiting 03/08/17   Don Perking, [place], MD  QUEtiapine (SEROQUEL) 100 MG tablet Take 1 tablet (100 mg total) by mouth at bedtime. Patient not taking: Reported on 01/22/2017 07/26/16   Pucilowska, Ellin Goodie, MD  QUEtiapine (SEROQUEL) 25 MG tablet Take 1 tablet (25 mg total) by mouth 3 (three) times daily as needed (anxiety). Patient not taking: Reported on 01/22/2017 07/26/16   Pucilowska, Ellin Goodie, MD  traMADol (ULTRAM) 50 MG tablet Take 1 tablet (50 mg total) every 6 (six) hours as needed by mouth. 03/01/17 03/01/18  Loleta Rose, MD  vitamin B-12 1000 MCG tablet Take 1 tablet (1,000 mcg total) by mouth daily. Patient not taking: Reported on 01/22/2017 07/26/16   Pucilowska, Braulio Conte B, MD  zolpidem (AMBIEN) 10 MG tablet Take 10 mg by mouth at bedtime as needed for sleep.    [provider]    Allergies Ciprofloxacin hcl; Fentanyl; Haldol [haloperidol]; Morphine and related; Propofol; and Vicodin [hydrocodone-acetaminophen]  Family History  Problem Relation Age of Onset  . Fibromyalgia Mother     Social History Social History   Tobacco Use  . Smoking status: Current Every Day Smoker    Packs/day: 1.00    Years: 40.00    Pack years: 40.00    Types: Cigarettes  . Smokeless tobacco: Current User  Substance Use Topics  . Alcohol use: No  . Drug use: No    Review of Systems  Constitutional: No fever/chills Eyes: No visual  changes. ENT: No sore throat. Cardiovascular: Denies chest pain. Respiratory: Denies shortness of breath. Gastrointestinal: No abdominal pain.  No nausea, no vomiting.  No diarrhea.  No constipation. Genitourinary: Negative for dysuria. Musculoskeletal: Negative for back pain. Skin: Negative for rash. Neurological: Negative for headaches, focal weakness or numbness.   ____________________________________________   PHYSICAL EXAM:  VITAL SIGNS: ED Triage Vitals  Enc Vitals Group     BP 07/30/17 1344 123/79     Pulse Rate 07/30/17 1344 90     Resp 07/30/17 1344 18     Temp 07/30/17 1344 98.6 F (37 C)     Temp Source 07/30/17 1344 Oral     SpO2 07/30/17 1344 95 %     Weight 07/30/17 1342 150 lb (68 kg)     Height 07/30/17 1342 5\' 6"  (1.676 m)     Head Circumference --      Peak  Flow --      Pain Score 07/30/17 1342 0     Pain Loc --      Pain Edu? --      Excl. in GC? --     Constitutional: Alert and oriented. Well appearing and in no acute distress. Eyes: Conjunctivae are normal.  Head: Atraumatic. Nose: No congestion/rhinnorhea. Mouth/Throat: Mucous membranes are moist.  Neck: No stridor.   Cardiovascular: Normal rate, regular rhythm. Grossly normal heart sounds.  Respiratory: Normal respiratory effort.  No retractions. Lungs CTAB. Gastrointestinal: Soft and nontender. No distention.  Musculoskeletal: No lower extremity tenderness nor edema.  Neurologic:  Normal speech and language. No gross focal neurologic deficits are appreciated. Skin:  Skin is warm, dry and intact. No rash noted. Psychiatric: Mood and affect are normal. Speech and behavior are normal.  ____________________________________________   LABS (all labs ordered are listed, but only abnormal results are displayed)  Labs Reviewed  COMPREHENSIVE METABOLIC PANEL - Abnormal; Notable for the following components:      Result Value   Glucose, Bld 120 (*)    ALT 10 (*)    All other components within  normal limits  ACETAMINOPHEN LEVEL - Abnormal; Notable for the following components:   Acetaminophen (Tylenol), Serum <10 (*)    All other components within normal limits  URINE DRUG SCREEN, QUALITATIVE ([facility] ONLY) - Abnormal; Notable for the following components:   Cannabinoid 50 Ng, Ur [place] POSITIVE (*)    Benzodiazepine, Ur Scrn POSITIVE (*)    All other components within normal limits  ETHANOL  SALICYLATE LEVEL  CBC   ____________________________________________  EKG   ____________________________________________  RADIOLOGY   ____________________________________________   PROCEDURES  Procedure(s) performed:   Procedures  Critical Care performed:   ____________________________________________   INITIAL IMPRESSION / ASSESSMENT AND PLAN / ED COURSE  Pertinent labs & imaging results that were available during my care of the patient were reviewed by me and considered in my medical decision making (see chart for details).  DDX: Psychosis, medication withdrawal, depression with psychotic features, polysubstance abuse As part of my medical decision making, I reviewed the following data within the electronic MEDICAL RECORD NUMBER Notes from prior ED visits  Patient evaluated by Dr. Toni Amend and pending disposition.  Patient does not meet IVC criteria.  No longer with hallucinations.  Not suicidal or homicidal.  ----------------------------------------- 6:19 PM on 07/30/2017 -----------------------------------------  Patient remains without hallucinations.  Calm and cooperative at this time.  Not an active psychosis.  Evaluated by Dr. Toni Amend recommends follow-up with Dr. Janeece Riggers.  Patient is understanding of this plan and willing to comply. ____________________________________________   FINAL CLINICAL IMPRESSION(S) / ED DIAGNOSES  Psychosis.  NEW MEDICATIONS STARTED DURING THIS VISIT:  New Prescriptions   No medications on file     Note:  This document was prepared  using Dragon voice recognition software and may include unintentional dictation errors.     Myrna Blazer, MD 07/30/17 [phone]

## 2017-07-30 NOTE — ED Triage Notes (Addendum)
Reports seeing uncle who has been dead.  Increasing paranoia and anxiety starting today. Denies SI/HI.  Reports on remeron for appetite and doctor supposed to be stopping it.  In triage pt states "there he is, he is supposed to be dead, don't let him in here".  Pt tearful.

## 2017-07-30 NOTE — ED Triage Notes (Signed)
Dressed out by [employer] RN and this RN

## 2017-07-30 NOTE — ED Notes (Signed)

## 2017-07-30 NOTE — ED Triage Notes (Signed)
Pt in via EMS. EMS reports that pt is on remeron, currently attempting to wean off. Pt with increased hallucinations, paranoia, and anxiety.   Pt had shoulder replacement on right arm and does not want BP cuff used on that arm.

## 2017-08-23 ENCOUNTER — Emergency Department
Admission: EM | Admit: 2017-08-23 | Discharge: 2017-08-23 | Disposition: A | Discharge status: Home / Self Care | Payer: Medicaid Other | Attending: Emergency Medicine | Admitting: Emergency Medicine

## 2017-08-23 ENCOUNTER — Emergency Department: Payer: Medicaid Other

## 2017-08-23 ENCOUNTER — Other Ambulatory Visit: Payer: Self-pay

## 2017-08-23 DIAGNOSIS — F419 Anxiety disorder, unspecified: Secondary | ICD-10-CM | POA: Insufficient documentation

## 2017-08-23 DIAGNOSIS — R638 Other symptoms and signs concerning food and fluid intake: Secondary | ICD-10-CM | POA: Diagnosis not present

## 2017-08-23 DIAGNOSIS — R109 Unspecified abdominal pain: Secondary | ICD-10-CM

## 2017-08-23 DIAGNOSIS — J45909 Unspecified asthma, uncomplicated: Secondary | ICD-10-CM | POA: Diagnosis not present

## 2017-08-23 DIAGNOSIS — F1120 Opioid dependence, uncomplicated: Secondary | ICD-10-CM | POA: Diagnosis not present

## 2017-08-23 DIAGNOSIS — R1011 Right upper quadrant pain: Secondary | ICD-10-CM | POA: Insufficient documentation

## 2017-08-23 DIAGNOSIS — F1220 Cannabis dependence, uncomplicated: Secondary | ICD-10-CM | POA: Diagnosis not present

## 2017-08-23 DIAGNOSIS — Z79899 Other long term (current) drug therapy: Secondary | ICD-10-CM | POA: Insufficient documentation

## 2017-08-23 DIAGNOSIS — F17210 Nicotine dependence, cigarettes, uncomplicated: Secondary | ICD-10-CM | POA: Diagnosis not present

## 2017-08-23 DIAGNOSIS — R630 Anorexia: Secondary | ICD-10-CM

## 2017-08-23 LAB — URINALYSIS, COMPLETE (UACMP) WITH MICROSCOPIC
BILIRUBIN URINE: NEGATIVE
Glucose, UA: NEGATIVE mg/dL
Hgb urine dipstick: NEGATIVE
KETONES UR: NEGATIVE mg/dL
LEUKOCYTES UA: NEGATIVE
Nitrite: NEGATIVE
Protein, ur: NEGATIVE mg/dL
SPECIFIC GRAVITY, URINE: 1.023 (ref 1.005–1.030)
pH: 6 (ref 5.0–8.0)

## 2017-08-23 LAB — CBC
HCT: 40.3 % (ref 35.0–47.0)
Hemoglobin: 14.2 g/dL (ref 12.0–16.0)
MCH: 32.7 pg (ref 26.0–34.0)
MCHC: 35.3 g/dL (ref 32.0–36.0)
MCV: 92.7 fL (ref 80.0–100.0)
Platelets: 303 10*3/uL (ref 150–440)
RBC: 4.35 MIL/uL (ref 3.80–5.20)
RDW: 12.9 % (ref 11.5–14.5)
WBC: 8.2 10*3/uL (ref 3.6–11.0)

## 2017-08-23 LAB — COMPREHENSIVE METABOLIC PANEL
ALBUMIN: 4.1 g/dL (ref 3.5–5.0)
ALT: 19 U/L (ref 14–54)
AST: 24 U/L (ref 15–41)
Alkaline Phosphatase: 89 U/L (ref 38–126)
Anion gap: 9 (ref 5–15)
BUN: 11 mg/dL (ref 6–20)
CO2: 24 mmol/L (ref 22–32)
Calcium: 8.9 mg/dL (ref 8.9–10.3)
Chloride: 107 mmol/L (ref 101–111)
Creatinine, Ser: 0.76 mg/dL (ref 0.44–1.00)
GFR calc Af Amer: >60 mL/min (ref >60)
Glucose, Bld: 109 mg/dL — ABNORMAL HIGH (ref 65–99)
Potassium: 3.5 mmol/L (ref 3.5–5.1)
SODIUM: 140 mmol/L (ref 135–145)
Total Bilirubin: 0.5 mg/dL (ref 0.3–1.2)
Total Protein: 7.1 g/dL (ref 6.5–8.1)

## 2017-08-23 LAB — LIPASE, BLOOD: Lipase: 27 U/L (ref 11–51)

## 2017-08-23 MED ORDER — ONDANSETRON 4 MG PO TBDP
4.0000 mg | ORAL_TABLET | Freq: Once | ORAL | Status: AC
  Administered 2017-08-23: 4 mg via ORAL

## 2017-08-23 MED ORDER — ONDANSETRON 4 MG PO TBDP
ORAL_TABLET | ORAL | Status: AC
  Administered 2017-08-23: 4 mg via ORAL
  Filled 2017-08-23: qty 1

## 2017-08-23 NOTE — Discharge Instructions (Signed)
Please seek medical attention for any high fevers, chest pain, shortness of breath, change in behavior, persistent vomiting, bloody stool or any other new or concerning symptoms.  

## 2017-08-23 NOTE — ED Notes (Signed)
Patient discharged to home per MD order. Patient in stable condition, and deemed medically cleared by ED provider for discharge. Discharge instructions reviewed with patient/family using "Teach Back"; verbalized understanding of medication education and administration, and information about follow-up care. Denies further concerns. ° °

## 2017-08-23 NOTE — ED Notes (Signed)
Lack of appetite, generalized abdominal pain, fatigue. Concerned she has diverticulitis. Ambulatory. Pt alert and oriented X4, active, cooperative, pt in NAD. RR even and unlabored, color WNL.

## 2017-08-23 NOTE — ED Notes (Signed)
Pt taken to US

## 2017-08-23 NOTE — ED Notes (Signed)
Report received from Ally RN. Patient care assumed. Patient/RN introduction complete. Will continue to monitor.  

## 2017-08-23 NOTE — ED Triage Notes (Signed)
Pt c/o generalized abd cramping with loss of appetite for the past 2-3 months, states she has a hx of IBS and was placed on Remeron for the appetite with no relief.. Denies vomiting or diarrhea.Marland Kitchen

## 2017-08-23 NOTE — ED Provider Notes (Signed)
[facility] Emergency Department Provider Note  ____________________________________________   I have reviewed the triage vital signs and the nursing notes.   HISTORY  Chief Complaint Abdominal Cramping   History limited by: Not Limited   HPI Shawna [name] is a 52 y.o. female who presents to the emergency department today because of concern for continued lack of appetite and abdominal pain. Current symptoms have been present for the past 1.5 months. She had similar symptoms that also lasted for roughly 1.5 months. Her PCP put her on Remeron which she stated caused her to gain too much weight but did feel like symptoms were better when she was on it. She denies any fevers but states that she has had chills. Has had nausea. Abdominal pain is generalized, crampy and worse in the morning.     Per medical record review patient has a history of previous ED visits for abdominal pain.  Past Medical History:  Diagnosis Date  . Anxiety   . Asthma   . Bulging lumbar disc   . Bulging lumbar disc   . GERD (gastroesophageal reflux disease)   . Insomnia   . Kidney stone   . Pneumonia DEC 2016  . Sciatic leg pain    Right side  . Sciatica of right side   . Weakness    Bil knees, and ankles    Patient Active Problem List   Diagnosis Date Noted  . Visual hallucinations 07/30/2017  . Colitis 01/22/2017  . Dyslipidemia 07/26/2016  . Low vitamin B12 level 07/26/2016  . Major depressive disorder, 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  . Overdose of sedative or hypnotic 07/23/2016  . COPD exacerbation (HCC) 07/23/2016  . GERD (gastroesophageal reflux disease) 07/23/2016  . Cannabis use disorder, moderate, 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  . Tobacco use disorder 07/23/2016  . Sedative, hypnotic or anxiolytic use disorder, severe, 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  . Bacterial pneumonia 01/23/2015  . Leukocytosis 01/23/2015  . Sepsis (HCC) 01/23/2015   . Hypoxemia 01/23/2015  . Opioid use disorder, moderate, dependence (HCC) 12/31/2014  . Anxiety disorder, unspecified 12/31/2014    Past Surgical History:  Procedure Laterality Date  . ABDOMINAL HYSTERECTOMY     Partial  . ESOPHAGOGASTRODUODENOSCOPY (EGD) WITH PROPOFOL N/A 11/27/2015   Procedure: ESOPHAGOGASTRODUODENOSCOPY (EGD) WITH PROPOFOL;  Surgeon: Christena Deem, MD;  Location: [facility] ENDOSCOPY;  Service: Endoscopy;  Laterality: N/A;  . SHOULDER SURGERY Right   . TONSILLECTOMY    . TUBAL LIGATION      Prior to Admission medications   Medication Sig Start Date End Date Taking? Authorizing Provider  acetaminophen (TYLENOL) 500 MG tablet Take 500 mg by mouth every 6 (six) hours as needed.    [provider]  albuterol (PROVENTIL HFA;VENTOLIN HFA) 108 (90 Base) MCG/ACT inhaler Inhale 2 puffs into the lungs every 6 (six) hours as needed for wheezing or shortness of breath. 01/16/16   Jennye Moccasin, MD  clonazePAM (KLONOPIN) 0.5 MG tablet Take 0.5 mg by mouth 2 (two) times daily as needed for anxiety.    [provider]  dexlansoprazole (DEXILANT) 60 MG capsule Take 1 capsule by mouth daily.    [provider]  dicyclomine (BENTYL) 10 MG capsule Take 10 mg by mouth 3 (three) times daily as needed.     [provider]  DULoxetine 40 MG CPEP Take 40 mg by mouth daily. 07/27/16   Pucilowska, Jolanta B, MD  fexofenadine (ALLEGRA) 180 MG tablet Take 180 mg by mouth daily.  [provider]  fluticasone (FLONASE) 50 MCG/ACT nasal spray Place 2 sprays into both nostrils daily.     [provider]  gemfibrozil (LOPID) 600 MG tablet Take 1 tablet (600 mg total) by mouth 2 (two) times daily before a meal. Patient not taking: Reported on 01/22/2017 07/26/16   Pucilowska, Ellin Goodie, MD  HYDROcodone-acetaminophen (NORCO) 5-325 MG tablet Take 1 tablet by mouth every 6 (six) hours as needed for severe pain. Patient not taking: Reported on 01/22/2017  01/17/17   Merrily Brittle, MD  hydrOXYzine (ATARAX/VISTARIL) 50 MG tablet Take 50 mg by mouth every 6 (six) hours as needed.     [provider]  lidocaine (XYLOCAINE) 5 % ointment Apply topically daily as needed (once daily for leg pain). Patient not taking: Reported on 01/22/2017 07/26/16   Pucilowska, Braulio Conte B, MD  loperamide (LOPERAMIDE A-D) 2 MG tablet Take 1 tablet (2 mg total) by mouth 4 (four) times daily as needed for diarrhea or loose stools. Patient not taking: Reported on 01/22/2017 07/07/16   Merrily Brittle, MD  ondansetron (ZOFRAN ODT) 4 MG disintegrating tablet Allow 1-2 tablets to dissolve in your mouth every 8 hours as needed for nausea/vomiting 03/08/17   Don Perking, [place], MD  QUEtiapine (SEROQUEL) 100 MG tablet Take 1 tablet (100 mg total) by mouth at bedtime. Patient not taking: Reported on 01/22/2017 07/26/16   Pucilowska, Ellin Goodie, MD  QUEtiapine (SEROQUEL) 25 MG tablet Take 1 tablet (25 mg total) by mouth 3 (three) times daily as needed (anxiety). Patient not taking: Reported on 01/22/2017 07/26/16   Pucilowska, Ellin Goodie, MD  traMADol (ULTRAM) 50 MG tablet Take 1 tablet (50 mg total) every 6 (six) hours as needed by mouth. 03/01/17 03/01/18  Loleta Rose, MD  vitamin B-12 1000 MCG tablet Take 1 tablet (1,000 mcg total) by mouth daily. Patient not taking: Reported on 01/22/2017 07/26/16   Pucilowska, Braulio Conte B, MD  zolpidem (AMBIEN) 10 MG tablet Take 10 mg by mouth at bedtime as needed for sleep.    [provider]    Allergies Ciprofloxacin hcl; Fentanyl; Haldol [haloperidol]; Morphine and related; Propofol; and Vicodin [hydrocodone-acetaminophen]  Family History  Problem Relation Age of Onset  . Fibromyalgia Mother     Social History Social History   Tobacco Use  . Smoking status: Current Every Day Smoker    Packs/day: 1.00    Years: 40.00    Pack years: 40.00    Types: Cigarettes  . Smokeless tobacco: Current User  Substance Use Topics  . Alcohol  use: No  . Drug use: No    Review of Systems Constitutional: No fever/chills Eyes: No visual changes. ENT: No sore throat. Cardiovascular: Denies chest pain. Respiratory: Denies shortness of breath. Gastrointestinal: Positive for abdominal pain and nausea.  Genitourinary: Negative for dysuria. Musculoskeletal: Negative for back pain. Skin: Negative for rash. Neurological: Negative for headaches, focal weakness or numbness.  ____________________________________________   PHYSICAL EXAM:  VITAL SIGNS: ED Triage Vitals  Enc Vitals Group     BP 08/23/17 1615 (!) 140/97     Pulse Rate 08/23/17 1615 94     Resp 08/23/17 1615 18     Temp 08/23/17 1615 98.7 F (37.1 C)     Temp Source 08/23/17 1615 Oral     SpO2 08/23/17 1615 96 %     Weight 08/23/17 1616 145 lb (65.8 kg)     Height 08/23/17 1616  (1.676 m)     Head Circumference --  Peak Flow --      Pain Score 08/23/17 1616 8   Constitutional: Alert and oriented. Well appearing and in no distress. Eyes: Conjunctivae are normal.  ENT   Head: Normocephalic and atraumatic.   Nose: No congestion/rhinnorhea.   Mouth/Throat: Mucous membranes are moist.   Neck: No stridor. Hematological/Lymphatic/Immunilogical: No cervical lymphadenopathy. Cardiovascular: Normal rate, regular rhythm.  No murmurs, rubs, or gallops.  Respiratory: Normal respiratory effort without tachypnea nor retractions. Breath sounds are clear and equal bilaterally. No wheezes/rales/rhonchi. Gastrointestinal: Soft and somewhat diffusely tender to palpation, worse tenderness in the right upper quadrant.  Genitourinary: Deferred Musculoskeletal: Normal range of motion in all extremities. No lower extremity edema. Neurologic:  Normal speech and language. No gross focal neurologic deficits are appreciated.  Skin:  Skin is warm, dry and intact. No rash noted. Psychiatric: Mood and affect are normal. Speech and behavior are normal. Patient  exhibits appropriate insight and judgment.  ____________________________________________    LABS (pertinent positives/negatives)  Lipase 27 CMP wnl except glu 109 CBC wbc 8.2, hgb 14.2, plt 303 UA not consistent with infection ____________________________________________   EKG  None  ____________________________________________    RADIOLOGY  RUQ [place] No gallstones   ____________________________________________   PROCEDURES  Procedures  ____________________________________________   INITIAL IMPRESSION / ASSESSMENT AND PLAN / ED COURSE  Pertinent labs & imaging results that were available during my care of the patient were reviewed by me and considered in my medical decision making (see chart for details).  Patient presented to the emergency department today because of somewhat chronic abdominal concerns.  Differential would be broad including pancreatitis hepatitis appendicitis gallbladder disease.  Patient was slightly more tender over the right upper quadrant.  Ultrasound was obtained that did not show any gallbladder disease.  Blood work also did not show any concerning findings.  At this point did have a discussion with the patient.  Unclear etiology of her symptoms however I did encourage her to continue following up with GI.   ____________________________________________   FINAL CLINICAL IMPRESSION(S) / ED DIAGNOSES  Final diagnoses:  RUQ pain  Decreased appetite  Abdominal pain, unspecified abdominal location     Note: This dictation was prepared with Dragon dictation. Any transcriptional errors that result from this process are unintentional     Phineas Semen, MD 08/23/17 1916

## 2017-08-27 ENCOUNTER — Other Ambulatory Visit: Payer: Self-pay | Admitting: Gastroenterology

## 2017-08-27 DIAGNOSIS — R110 Nausea: Secondary | ICD-10-CM

## 2017-08-27 DIAGNOSIS — R1011 Right upper quadrant pain: Secondary | ICD-10-CM

## 2017-08-27 DIAGNOSIS — R1084 Generalized abdominal pain: Secondary | ICD-10-CM

## 2017-08-30 ENCOUNTER — Emergency Department
Admission: EM | Admit: 2017-08-30 | Discharge: 2017-08-30 | Disposition: A | Discharge status: Home / Self Care | Payer: Medicaid Other | Attending: Emergency Medicine | Admitting: Emergency Medicine

## 2017-08-30 ENCOUNTER — Other Ambulatory Visit: Payer: Self-pay

## 2017-08-30 ENCOUNTER — Emergency Department: Payer: Medicaid Other

## 2017-08-30 DIAGNOSIS — F17210 Nicotine dependence, cigarettes, uncomplicated: Secondary | ICD-10-CM | POA: Diagnosis not present

## 2017-08-30 DIAGNOSIS — J45909 Unspecified asthma, uncomplicated: Secondary | ICD-10-CM | POA: Insufficient documentation

## 2017-08-30 DIAGNOSIS — Z79899 Other long term (current) drug therapy: Secondary | ICD-10-CM | POA: Diagnosis not present

## 2017-08-30 DIAGNOSIS — M8700 Idiopathic aseptic necrosis of unspecified bone: Secondary | ICD-10-CM | POA: Diagnosis not present

## 2017-08-30 DIAGNOSIS — R1084 Generalized abdominal pain: Secondary | ICD-10-CM | POA: Diagnosis present

## 2017-08-30 LAB — BASIC METABOLIC PANEL
ANION GAP: 7 (ref 5–15)
BUN: 9 mg/dL (ref 6–20)
CHLORIDE: 110 mmol/L (ref 101–111)
CO2: 23 mmol/L (ref 22–32)
Calcium: 8.5 mg/dL — ABNORMAL LOW (ref 8.9–10.3)
Creatinine, Ser: 0.75 mg/dL (ref 0.44–1.00)
GFR calc Af Amer: >60 mL/min (ref >60)
Glucose, Bld: 120 mg/dL — ABNORMAL HIGH (ref 65–99)
POTASSIUM: 2.9 mmol/L — AB (ref 3.5–5.1)
SODIUM: 140 mmol/L (ref 135–145)

## 2017-08-30 LAB — CBC WITH DIFFERENTIAL/PLATELET
BASOS PCT: 1 %
Basophils Absolute: 0 10*3/uL (ref 0–0.1)
EOS ABS: 0.1 10*3/uL (ref 0–0.7)
Eosinophils Relative: 1 %
HEMATOCRIT: 39.5 % (ref 35.0–47.0)
HEMOGLOBIN: 13.7 g/dL (ref 12.0–16.0)
LYMPHS ABS: 1.5 10*3/uL (ref 1.0–3.6)
Lymphocytes Relative: 20 %
MCH: 31.8 pg (ref 26.0–34.0)
MCHC: 34.6 g/dL (ref 32.0–36.0)
MCV: 91.8 fL (ref 80.0–100.0)
MONO ABS: 0.2 10*3/uL (ref 0.2–0.9)
MONOS PCT: 3 %
Neutro Abs: 5.8 10*3/uL (ref 1.4–6.5)
Neutrophils Relative %: 75 %
Platelets: 254 10*3/uL (ref 150–440)
RBC: 4.31 MIL/uL (ref 3.80–5.20)
RDW: 13.2 % (ref 11.5–14.5)
WBC: 7.7 10*3/uL (ref 3.6–11.0)

## 2017-08-30 LAB — URINALYSIS, COMPLETE (UACMP) WITH MICROSCOPIC
BACTERIA UA: NONE SEEN
Bilirubin Urine: NEGATIVE
Glucose, UA: NEGATIVE mg/dL
Hgb urine dipstick: NEGATIVE
Ketones, ur: NEGATIVE mg/dL
Leukocytes, UA: NEGATIVE
Nitrite: NEGATIVE
PROTEIN: NEGATIVE mg/dL
SPECIFIC GRAVITY, URINE: 1.012 (ref 1.005–1.030)
pH: 7 (ref 5.0–8.0)

## 2017-08-30 LAB — PROTIME-INR
INR: 1.07
PROTHROMBIN TIME: 13.8 s (ref 11.4–15.2)

## 2017-08-30 LAB — HEPATIC FUNCTION PANEL
ALK PHOS: 82 U/L (ref 38–126)
ALT: 10 U/L — ABNORMAL LOW (ref 14–54)
AST: 18 U/L (ref 15–41)
Albumin: 3.6 g/dL (ref 3.5–5.0)
BILIRUBIN TOTAL: 0.7 mg/dL (ref 0.3–1.2)
Total Protein: 6.3 g/dL — ABNORMAL LOW (ref 6.5–8.1)

## 2017-08-30 LAB — LIPASE, BLOOD: LIPASE: 22 U/L (ref 11–51)

## 2017-08-30 MED ORDER — IOPAMIDOL (ISOVUE-370) INJECTION 76%
75.0000 mL | Freq: Once | INTRAVENOUS | Status: AC | PRN
  Administered 2017-08-30: 75 mL via INTRAVENOUS

## 2017-08-30 MED ORDER — KETOROLAC TROMETHAMINE 30 MG/ML IJ SOLN
15.0000 mg | Freq: Once | INTRAMUSCULAR | Status: AC
  Administered 2017-08-30: 15 mg via INTRAVENOUS
  Filled 2017-08-30: qty 1

## 2017-08-30 MED ORDER — HALOPERIDOL LACTATE 5 MG/ML IJ SOLN
5.0000 mg | Freq: Once | INTRAMUSCULAR | Status: AC
  Administered 2017-08-30: 5 mg via INTRAVENOUS
  Filled 2017-08-30: qty 1

## 2017-08-30 NOTE — ED Provider Notes (Signed)
[facility] Emergency Department Provider Note  ____________________________________________   First MD Initiated Contact with Patient 08/30/17 1137     (approximate)  I have reviewed the triage vital signs and the nursing notes.   HISTORY  Chief Complaint Abdominal Pain   HPI Shawna [name] is a 52 y.o. female who comes to the emergency department via EMS with several weeks of diffuse abdominal pain.  The pain is been acutely worse for the past several days.  Associated with nausea.  She says she has been followed by gastroenterology at the [facility] clinic and they have not yet determined the etiology of her symptoms.  She has a history of hysterectomy as well as previous EGD.  She is pending a colonoscopy.  She says she is supposed to get a CT scan of her liver soon.  Her pain is epigastric, nonradiating, diffuse, moderate to severe, nothing seems to make it better or worse.  Past Medical History:  Diagnosis Date  . Anxiety   . Asthma   . Bulging lumbar disc   . Bulging lumbar disc   . GERD (gastroesophageal reflux disease)   . Insomnia   . Kidney stone   . Pneumonia DEC 2016  . Sciatic leg pain    Right side  . Sciatica of right side   . Weakness    Bil knees, and ankles    Patient Active Problem List   Diagnosis Date Noted  . Visual hallucinations 07/30/2017  . Colitis 01/22/2017  . Dyslipidemia 07/26/2016  . Low vitamin B12 level 07/26/2016  . Major depressive disorder, 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  . Overdose of sedative or hypnotic 07/23/2016  . COPD exacerbation (HCC) 07/23/2016  . GERD (gastroesophageal reflux disease) 07/23/2016  . Cannabis use disorder, moderate, 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  . Tobacco use disorder 07/23/2016  . Sedative, hypnotic or anxiolytic use disorder, severe, 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  . Bacterial pneumonia 01/23/2015  . Leukocytosis 01/23/2015  . Sepsis (HCC) 01/23/2015  .  Hypoxemia 01/23/2015  . Opioid use disorder, moderate, dependence (HCC) 12/31/2014  . Anxiety disorder, unspecified 12/31/2014    Past Surgical History:  Procedure Laterality Date  . ABDOMINAL HYSTERECTOMY     Partial  . ESOPHAGOGASTRODUODENOSCOPY (EGD) WITH PROPOFOL N/A 11/27/2015   Procedure: ESOPHAGOGASTRODUODENOSCOPY (EGD) WITH PROPOFOL;  Surgeon: Christena Deem, MD;  Location: [facility] ENDOSCOPY;  Service: Endoscopy;  Laterality: N/A;  . SHOULDER SURGERY Right   . TONSILLECTOMY    . TUBAL LIGATION      Prior to Admission medications   Medication Sig Start Date End Date Taking? Authorizing Provider  acetaminophen (TYLENOL) 500 MG tablet Take 500 mg by mouth every 6 (six) hours as needed.    [provider]  albuterol (PROVENTIL HFA;VENTOLIN HFA) 108 (90 Base) MCG/ACT inhaler Inhale 2 puffs into the lungs every 6 (six) hours as needed for wheezing or shortness of breath. 01/16/16   Jennye Moccasin, MD  clonazePAM (KLONOPIN) 0.5 MG tablet Take 0.5 mg by mouth 2 (two) times daily as needed for anxiety.    [provider]  dexlansoprazole (DEXILANT) 60 MG capsule Take 1 capsule by mouth daily.    [provider]  dicyclomine (BENTYL) 10 MG capsule Take 10 mg by mouth 3 (three) times daily as needed.     [provider]  DULoxetine 40 MG CPEP Take 40 mg by mouth daily. 07/27/16   Pucilowska, Jolanta B, MD  fexofenadine (ALLEGRA) 180 MG tablet Take 180  mg by mouth daily.    [provider]  fluticasone (FLONASE) 50 MCG/ACT nasal spray Place 2 sprays into both nostrils daily.     [provider]  gemfibrozil (LOPID) 600 MG tablet Take 1 tablet (600 mg total) by mouth 2 (two) times daily before a meal. Patient not taking: Reported on 01/22/2017 07/26/16   Pucilowska, Ellin Goodie, MD  HYDROcodone-acetaminophen (NORCO) 5-325 MG tablet Take 1 tablet by mouth every 6 (six) hours as needed for severe pain. Patient not taking: Reported on 01/22/2017  01/17/17   Merrily Brittle, MD  hydrOXYzine (ATARAX/VISTARIL) 50 MG tablet Take 50 mg by mouth every 6 (six) hours as needed.     [provider]  lidocaine (XYLOCAINE) 5 % ointment Apply topically daily as needed (once daily for leg pain). Patient not taking: Reported on 01/22/2017 07/26/16   Pucilowska, Braulio Conte B, MD  loperamide (LOPERAMIDE A-D) 2 MG tablet Take 1 tablet (2 mg total) by mouth 4 (four) times daily as needed for diarrhea or loose stools. Patient not taking: Reported on 01/22/2017 07/07/16   Merrily Brittle, MD  ondansetron (ZOFRAN ODT) 4 MG disintegrating tablet Allow 1-2 tablets to dissolve in your mouth every 8 hours as needed for nausea/vomiting 03/08/17   Don Perking, [place], MD  QUEtiapine (SEROQUEL) 100 MG tablet Take 1 tablet (100 mg total) by mouth at bedtime. Patient not taking: Reported on 01/22/2017 07/26/16   Pucilowska, Ellin Goodie, MD  QUEtiapine (SEROQUEL) 25 MG tablet Take 1 tablet (25 mg total) by mouth 3 (three) times daily as needed (anxiety). Patient not taking: Reported on 01/22/2017 07/26/16   Pucilowska, Ellin Goodie, MD  traMADol (ULTRAM) 50 MG tablet Take 1 tablet (50 mg total) every 6 (six) hours as needed by mouth. 03/01/17 03/01/18  Loleta Rose, MD  vitamin B-12 1000 MCG tablet Take 1 tablet (1,000 mcg total) by mouth daily. Patient not taking: Reported on 01/22/2017 07/26/16   Pucilowska, Braulio Conte B, MD  zolpidem (AMBIEN) 10 MG tablet Take 10 mg by mouth at bedtime as needed for sleep.    [provider]    Allergies Ciprofloxacin hcl; Fentanyl; Haldol [haloperidol]; Morphine and related; Phenergan [promethazine hcl]; Propofol; and Vicodin [hydrocodone-acetaminophen]  Family History  Problem Relation Age of Onset  . Fibromyalgia Mother     Social History Social History   Tobacco Use  . Smoking status: Current Every Day Smoker    Packs/day: 1.00    Years: 40.00    Pack years: 40.00    Types: Cigarettes  . Smokeless tobacco: Current User    Substance Use Topics  . Alcohol use: No  . Drug use: No    Review of Systems Constitutional: No fever/chills Eyes: No visual changes. ENT: No sore throat. Cardiovascular: Denies chest pain. Respiratory: Denies shortness of breath. Gastrointestinal: Positive for abdominal pain.  Positive for nausea, no vomiting.  No diarrhea.  No constipation. Genitourinary: Negative for dysuria. Musculoskeletal: Negative for back pain. Skin: Negative for rash. Neurological: Negative for headaches, focal weakness or numbness.   ____________________________________________   PHYSICAL EXAM:  VITAL SIGNS: ED Triage Vitals [08/30/17 1137]  Enc Vitals Group     BP      Pulse      Resp      Temp      Temp src      SpO2 96 %     Weight      Height      Head Circumference      Peak Flow  Pain Score      Pain Loc      Pain Edu?      Excl. in GC?     Constitutional: Alert and oriented x4 chronically ill-appearing but does appear uncomfortable Eyes: PERRL EOMI. Head: Atraumatic. Nose: No congestion/rhinnorhea. Mouth/Throat: No trismus Neck: No stridor.   Cardiovascular: Normal rate, regular rhythm. Grossly normal heart sounds.  Good peripheral circulation. Respiratory: Normal respiratory effort.  No retractions. Lungs CTAB and moving good air Gastrointestinal: Soft diffuse tenderness with no focality no rebound or guarding no peritonitis Musculoskeletal: No lower extremity edema   Neurologic:  Normal speech and language. No gross focal neurologic deficits are appreciated. Skin:  Skin is warm, dry and intact. No rash noted. Psychiatric: Mood and affect are normal. Speech and behavior are normal.    ____________________________________________   DIFFERENTIAL includes but not limited to  Appendicitis, diverticulitis, pyelonephritis, nephrolithiasis, irritable bowel syndrome, Crohn's disease ____________________________________________   LABS (all labs ordered are listed, but  only abnormal results are displayed)  Labs Reviewed  HEPATIC FUNCTION PANEL - Abnormal; Notable for the following components:      Result Value   Total Protein 6.3 (*)    ALT 10 (*)    Bilirubin, Direct <0.1 (*)    All other components within normal limits  URINALYSIS, COMPLETE (UACMP) WITH MICROSCOPIC - Abnormal; Notable for the following components:   Color, Urine YELLOW (*)    APPearance CLEAR (*)    All other components within normal limits  BASIC METABOLIC PANEL - Abnormal; Notable for the following components:   Potassium 2.9 (*)    Glucose, Bld 120 (*)    Calcium 8.5 (*)    All other components within normal limits  LIPASE, BLOOD  PROTIME-INR  CBC WITH DIFFERENTIAL/PLATELET    Lab work reviewed by me with clinically insignificant hypokalemia otherwise unremarkable __________________________________________  EKG   ____________________________________________  RADIOLOGY  CT abdomen pelvis reviewed by me with no etiology for symptoms identified.  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 ____________________________________________   PROCEDURES  Procedure(s) performed: no  Procedures  Critical Care performed: no  Observation: no ____________________________________________   INITIAL IMPRESSION / ASSESSMENT AND PLAN / ED COURSE  Pertinent labs & imaging results that were available during my care of the patient were reviewed by me and considered in my medical decision making (see chart for details).  On arrival the patient appears obviously nauseated and uncomfortable.  We will establish IV check broad labs and give IV Haldol for pain and nausea.  CT abdomen pelvis pending.    ----------------------------------------- 2:37 PM on 08/30/2017 -----------------------------------------  The patient's abdominal pain is improved and she is able to keep food down.  She declines pain medication.  We discussed her avascular necrosis and this is known and  she is planned to have repair in the future.  She declines prescription for pain medication and feels reassured with the results today.  I have encouraged her to keep her follow-up as scheduled.  She is discharged home in improved condition verbalized understanding agree with the plan.  ____________________________________________   FINAL CLINICAL IMPRESSION(S) / ED DIAGNOSES  Final diagnoses:  Generalized abdominal pain  Avascular necrosis (HCC)      NEW MEDICATIONS STARTED DURING THIS VISIT:  Discharge Medication List as of 08/30/2017  2:37 PM       Note:  This document was prepared using Dragon voice recognition software and may include unintentional dictation errors.     Merrily Brittle, MD  09/01/17 0113  

## 2017-08-30 NOTE — ED Notes (Signed)
Verified with Dr Lamont Snowball that he wanted to give Haldol when the pt states that she is allergic to it - he states to give the medication that there has never been a true documented case of allergy to Haldol

## 2017-08-30 NOTE — Discharge Instructions (Signed)
It was a pleasure to take care of you today, and thank you for coming to our emergency department.  If you have any questions or concerns before leaving please ask the nurse to grab me and I'm more than happy to go through your aftercare instructions again.  If you were prescribed any opioid pain medication today such as Norco, Vicodin, Percocet, morphine, hydrocodone, or oxycodone please make sure you do not drive when you are taking this medication as it can alter your ability to drive safely.  If you have any concerns once you are home that you are not improving or are in fact getting worse before you can make it to your follow-up appointment, please do not hesitate to call 911 and come back for further evaluation.  Merrily Brittle, MD  Results for orders placed or performed during the hospital encounter of 08/30/17  Hepatic function panel  Result Value Ref Range   Total Protein 6.3 (L) 6.5 - 8.1 g/dL   Albumin 3.6 3.5 - 5.0 g/dL   AST 18 15 - 41 U/L   ALT 10 (L) 14 - 54 U/L   Alkaline Phosphatase 82 38 - 126 U/L   Total Bilirubin 0.7 0.3 - 1.2 mg/dL   Bilirubin, Direct <1.6 (L) 0.1 - 0.5 mg/dL   Indirect Bilirubin NOT CALCULATED 0.3 - 0.9 mg/dL  Lipase, blood  Result Value Ref Range   Lipase 22 11 - 51 U/L  Protime-INR  Result Value Ref Range   Prothrombin Time 13.8 11.4 - 15.2 seconds   INR 1.07   CBC with Differential  Result Value Ref Range   WBC 7.7 3.6 - 11.0 K/uL   RBC 4.31 3.80 - 5.20 MIL/uL   Hemoglobin 13.7 12.0 - 16.0 g/dL   HCT 10.9 60.4 - 54.0 %   MCV 91.8 80.0 - 100.0 fL   MCH 31.8 26.0 - 34.0 pg   MCHC 34.6 32.0 - 36.0 g/dL   RDW 98.1 19.1 - 47.8 %   Platelets 254 150 - 440 K/uL   Neutrophils Relative % 75 %   Neutro Abs 5.8 1.4 - 6.5 K/uL   Lymphocytes Relative 20 %   Lymphs Abs 1.5 1.0 - 3.6 K/uL   Monocytes Relative 3 %   Monocytes Absolute 0.2 0.2 - 0.9 K/uL   Eosinophils Relative 1 %   Eosinophils Absolute 0.1 0 - 0.7 K/uL   Basophils Relative 1 %   Basophils Absolute 0.0 0 - 0.1 K/uL  Urinalysis, Complete w Microscopic  Result Value Ref Range   Color, Urine YELLOW (A) YELLOW   APPearance CLEAR (A) CLEAR   Specific Gravity, Urine 1.012 1.005 - 1.030   pH 7.0 5.0 - 8.0   Glucose, UA NEGATIVE NEGATIVE mg/dL   Hgb urine dipstick NEGATIVE NEGATIVE   Bilirubin Urine NEGATIVE NEGATIVE   Ketones, ur NEGATIVE NEGATIVE mg/dL   Protein, ur NEGATIVE NEGATIVE mg/dL   Nitrite NEGATIVE NEGATIVE   Leukocytes, UA NEGATIVE NEGATIVE   RBC / HPF 0-5 0 - 5 RBC/hpf   WBC, UA 0-5 0 - 5 WBC/hpf   Bacteria, UA NONE SEEN NONE SEEN   Squamous Epithelial / LPF 6-10 0 - 5   Mucus PRESENT   Basic metabolic panel  Result Value Ref Range   Sodium 140 135 - 145 mmol/L   Potassium 2.9 (L) 3.5 - 5.1 mmol/L   Chloride 110 101 - 111 mmol/L   CO2 23 22 - 32 mmol/L   Glucose, Bld 120 (H) 65 -  99 mg/dL   BUN 9 6 - 20 mg/dL   Creatinine, Ser 1.30 0.44 - 1.00 mg/dL   Calcium 8.5 (L) 8.9 - 10.3 mg/dL   GFR calc non Af Amer >60 >60 mL/min   GFR calc Af Amer >60 >60 mL/min   Anion gap 7 5 - 15   Ct Abdomen Pelvis W Contrast  Result Date: 08/30/2017 CLINICAL DATA:  Abdominal pain for several weeks, history of acid reflux and irritable bowel syndrome, some vomiting today EXAM: CT ABDOMEN AND PELVIS WITH CONTRAST TECHNIQUE: Multidetector CT imaging of the abdomen and pelvis was performed using the standard protocol following bolus administration of intravenous contrast. CONTRAST:  75mL ISOVUE-370 IOPAMIDOL (ISOVUE-370) INJECTION 76% COMPARISON:  CT abdomen pelvis of 01/17/2017 FINDINGS: Lower chest: Linear scarring posteriorly in both lower lobes appears stable. Heart is within normal limits in size. Hepatobiliary: The liver enhances with no focal abnormality and no ductal dilatation is seen. No calcified gallstones are noted. Pancreas: The pancreas is normal in size and the pancreatic duct is not dilated. Spleen: The spleen is unremarkable. Adrenals/Urinary Tract: The  adrenal glands appear symmetrical and normal. The kidneys enhance and there is a calculus in the lower pole of left kidney of 6 mm in diameter. On coronal images this appears to represent 2 adjacent smaller calculi. In addition a tiny nonobstructing right mid renal calculus is present of only 2 mm in diameter probable complex cyst is noted in the periphery of the left lower kidney is well with a smaller cyst in the lower pole of the right kidney. No hydronephrosis is seen. The ureters are normal in caliber to the urinary bladder. The urinary bladder is not well distended but no abnormality is seen. Stomach/Bowel: The stomach is not well distended. No small bowel abnormality is seen. The colon is not distended but no abnormality is seen. The terminal ileum is unremarkable. The appendix is very faintly visualized low in the right pelvis with no F full may shin evident. Vascular/Lymphatic: The abdominal aorta is normal in caliber but there is significant abdominal aortic atherosclerotic change present. The celiac axis and SMA are patent and there is no vascular etiology of the abdominal pain noted. No adenopathy is seen. Reproductive: The uterus has previously been resected. No adnexal lesion is seen. No fluid is noted within the pelvis. Other: No abdominal wall hernia is noted. Musculoskeletal: The lumbar vertebrae are normal alignment with normal intervertebral disc spaces. Abnormality is noted of both femoral heads consistent with avascular necrosis right greater than left. IMPRESSION: 1. No explanation for the patient's abdominal pain is seen. No abnormality of large or small bowel is noted and there is no evidence of inflammation. No vascular abnormality is seen 2. However there are changes consistent with bilateral avascular necrosis of the femoral heads right greater than left. Correlate clinically. 3. Renal calculi bilaterally with the more prominent calculi in the lower pole of the left kidney of 3-4 mm in  diameter each. 4. Significant abdominal aortic atherosclerosis. Electronically Signed   By: Dwyane Dee M.D.   On: 08/30/2017 13:22   US Abdomen Limited Ruq  Result Date: 08/23/2017 CLINICAL DATA:  Right upper quadrant pain for 3 months. EXAM: ULTRASOUND ABDOMEN LIMITED RIGHT UPPER QUADRANT COMPARISON:  None. FINDINGS: Gallbladder: No gallstones or wall thickening visualized. No sonographic Murphy sign noted by sonographer. Common bile duct: Diameter: 4 mm Liver: No focal lesion identified. Within normal limits in parenchymal echogenicity. Portal vein is patent on color Doppler imaging with  normal direction of blood flow towards the liver. IMPRESSION: No abnormalities to explain the patient's right upper quadrant pain. Electronically Signed   By: Gerome Sam III M.D   On: 08/23/2017 18:40

## 2017-08-30 NOTE — ED Notes (Signed)
Pt is aware to call for nurse when she has to void because sample is needed

## 2017-08-30 NOTE — ED Triage Notes (Signed)
Pt arrived via ems for c/o abd pain that has been going on for weeks - she is being followed by physician at [facility] and they are unable to  Determine cause for abd pain at this time - she does have a hx of acid reflux and IBS - pt started vomiting at 3am and called PCP and they advised her to come to ED

## 2017-09-02 ENCOUNTER — Ambulatory Visit: Payer: Medicaid Other

## 2017-09-16 ENCOUNTER — Ambulatory Visit
Admission: RE | Admit: 2017-09-16 | Discharge: 2017-09-16 | Disposition: A | Discharge status: Home / Self Care | Payer: Medicaid Other | Source: Ambulatory Visit | Attending: Gastroenterology | Admitting: Gastroenterology

## 2017-09-16 DIAGNOSIS — R110 Nausea: Secondary | ICD-10-CM | POA: Diagnosis present

## 2017-09-16 DIAGNOSIS — R1084 Generalized abdominal pain: Secondary | ICD-10-CM | POA: Insufficient documentation

## 2017-09-16 DIAGNOSIS — R1011 Right upper quadrant pain: Secondary | ICD-10-CM | POA: Insufficient documentation

## 2017-09-16 MED ORDER — TECHNETIUM TC 99M MEBROFENIN IV KIT
5.0000 | PACK | Freq: Once | INTRAVENOUS | Status: AC | PRN
  Administered 2017-09-16: 5.4 via INTRAVENOUS

## 2018-02-03 ENCOUNTER — Encounter: Payer: Self-pay | Admitting: Emergency Medicine

## 2018-02-03 ENCOUNTER — Emergency Department
Admission: EM | Admit: 2018-02-03 | Discharge: 2018-02-03 | Disposition: A | Discharge status: Home / Self Care | Payer: Medicaid Other | Attending: Emergency Medicine | Admitting: Emergency Medicine

## 2018-02-03 ENCOUNTER — Emergency Department: Payer: Medicaid Other

## 2018-02-03 ENCOUNTER — Other Ambulatory Visit: Payer: Self-pay

## 2018-02-03 DIAGNOSIS — S82831A Other fracture of upper and lower end of right fibula, initial encounter for closed fracture: Secondary | ICD-10-CM

## 2018-02-03 DIAGNOSIS — Y999 Unspecified external cause status: Secondary | ICD-10-CM | POA: Insufficient documentation

## 2018-02-03 DIAGNOSIS — S99911A Unspecified injury of right ankle, initial encounter: Secondary | ICD-10-CM | POA: Diagnosis present

## 2018-02-03 DIAGNOSIS — F17210 Nicotine dependence, cigarettes, uncomplicated: Secondary | ICD-10-CM | POA: Insufficient documentation

## 2018-02-03 DIAGNOSIS — Y929 Unspecified place or not applicable: Secondary | ICD-10-CM | POA: Insufficient documentation

## 2018-02-03 DIAGNOSIS — S89301A Unspecified physeal fracture of lower end of right fibula, initial encounter for closed fracture: Secondary | ICD-10-CM | POA: Diagnosis not present

## 2018-02-03 DIAGNOSIS — J45909 Unspecified asthma, uncomplicated: Secondary | ICD-10-CM | POA: Diagnosis not present

## 2018-02-03 DIAGNOSIS — X501XXA Overexertion from prolonged static or awkward postures, initial encounter: Secondary | ICD-10-CM | POA: Diagnosis not present

## 2018-02-03 DIAGNOSIS — Y939 Activity, unspecified: Secondary | ICD-10-CM | POA: Diagnosis not present

## 2018-02-03 DIAGNOSIS — Y92009 Unspecified place in unspecified non-institutional (private) residence as the place of occurrence of the external cause: Secondary | ICD-10-CM | POA: Diagnosis not present

## 2018-02-03 DIAGNOSIS — Z79899 Other long term (current) drug therapy: Secondary | ICD-10-CM | POA: Insufficient documentation

## 2018-02-03 MED ORDER — MELOXICAM 15 MG PO TABS: 15.0000 mg | ORAL_TABLET | Freq: Every day | ORAL | 0 refills | Status: DC

## 2018-02-03 MED ORDER — ONDANSETRON 8 MG PO TBDP
8.0000 mg | ORAL_TABLET | Freq: Once | ORAL | Status: AC
  Administered 2018-02-03: 8 mg via ORAL
  Filled 2018-02-03: qty 1

## 2018-02-03 MED ORDER — OXYCODONE-ACETAMINOPHEN 5-325 MG PO TABS
1.0000 | ORAL_TABLET | Freq: Once | ORAL | Status: AC
  Administered 2018-02-03: 1 via ORAL
  Filled 2018-02-03: qty 1

## 2018-02-03 MED ORDER — HYDROCODONE-ACETAMINOPHEN 5-325 MG PO TABS: 1.0000 | ORAL_TABLET | ORAL | 0 refills | Status: DC | PRN

## 2018-02-03 MED ORDER — ONDANSETRON 4 MG PO TBDP: 4.0000 mg | ORAL_TABLET | Freq: Three times a day (TID) | ORAL | 0 refills | Status: DC | PRN

## 2018-02-03 NOTE — ED Triage Notes (Signed)
Got up from sitting this am and stepped on right foot and felt pop.  Says has pain ankle that radiates up.  Has ice in place by ems.

## 2018-02-03 NOTE — ED Notes (Signed)
See triage note  Presents with pain to right foot  States she stood up and felt a pop to ankle/foot

## 2018-02-03 NOTE — ED Provider Notes (Signed)
[facility] Emergency Department Provider Note  ____________________________________________  Time seen: Approximately 2:33 PM  I have reviewed the triage vital signs and the nursing notes.   HISTORY  Chief Complaint Ankle Pain    HPI Shawna [name] is a 52 y.o. female who presents the emergency department complaining of sharp right ankle pain.  Patient reports that she had been sitting crosslegged at home, her legs went numb.  When she attempted to stand, she reports that her ankle "rolled" all the way over.  Patient heard a loud pop, had exquisite pain to the lateral aspect of the ankle.  She reports swelling over the lateral malleolus.  No other injury or complaint.  No history of previous ankle injuries.    Past Medical History:  Diagnosis Date  . Anxiety   . Asthma   . Bulging lumbar disc   . Bulging lumbar disc   . GERD (gastroesophageal reflux disease)   . Insomnia   . Kidney stone   . Pneumonia DEC 2016  . Sciatic leg pain    Right side  . Sciatica of right side   . Weakness    Bil knees, and ankles    Patient Active Problem List   Diagnosis Date Noted  . Visual hallucinations 07/30/2017  . Colitis 01/22/2017  . Dyslipidemia 07/26/2016  . Low vitamin B12 level 07/26/2016  . Major depressive disorder, 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  . Overdose of sedative or hypnotic 07/23/2016  . COPD exacerbation (HCC) 07/23/2016  . GERD (gastroesophageal reflux disease) 07/23/2016  . Cannabis use disorder, moderate, 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  . Tobacco use disorder 07/23/2016  . Sedative, hypnotic or anxiolytic use disorder, severe, 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  . Bacterial pneumonia 01/23/2015  . Leukocytosis 01/23/2015  . Sepsis (HCC) 01/23/2015  . Hypoxemia 01/23/2015  . Opioid use disorder, moderate, dependence (HCC) 12/31/2014  . Anxiety disorder, unspecified 12/31/2014    Past Surgical History:  Procedure  Laterality Date  . ABDOMINAL HYSTERECTOMY     Partial  . ESOPHAGOGASTRODUODENOSCOPY (EGD) WITH PROPOFOL N/A 11/27/2015   Procedure: ESOPHAGOGASTRODUODENOSCOPY (EGD) WITH PROPOFOL;  Surgeon: Christena Deem, MD;  Location: [facility] ENDOSCOPY;  Service: Endoscopy;  Laterality: N/A;  . SHOULDER SURGERY Right   . TONSILLECTOMY    . TUBAL LIGATION      Prior to Admission medications   Medication Sig Start Date End Date Taking? Authorizing Provider  acetaminophen (TYLENOL) 500 MG tablet Take 500 mg by mouth every 6 (six) hours as needed.    [provider]  albuterol (PROVENTIL HFA;VENTOLIN HFA) 108 (90 Base) MCG/ACT inhaler Inhale 2 puffs into the lungs every 6 (six) hours as needed for wheezing or shortness of breath. 01/16/16   Jennye Moccasin, MD  clonazePAM (KLONOPIN) 0.5 MG tablet Take 0.5 mg by mouth 2 (two) times daily as needed for anxiety.    [provider]  dexlansoprazole (DEXILANT) 60 MG capsule Take 1 capsule by mouth daily.    [provider]  dicyclomine (BENTYL) 10 MG capsule Take 10 mg by mouth 3 (three) times daily as needed.     [provider]  DULoxetine 40 MG CPEP Take 40 mg by mouth daily. 07/27/16   Pucilowska, Jolanta B, MD  fexofenadine (ALLEGRA) 180 MG tablet Take 180 mg by mouth daily.    [provider]  fluticasone (FLONASE) 50 MCG/ACT nasal spray Place 2 sprays into both nostrils daily.     [provider]  gemfibrozil (LOPID)  600 MG tablet Take 1 tablet (600 mg total) by mouth 2 (two) times daily before a meal. Patient not taking: Reported on 01/22/2017 07/26/16   Pucilowska, Ellin Goodie, MD  HYDROcodone-acetaminophen (NORCO) 5-325 MG tablet Take 1 tablet by mouth every 6 (six) hours as needed for severe pain. Patient not taking: Reported on 01/22/2017 01/17/17   Merrily Brittle, MD  HYDROcodone-acetaminophen (NORCO/VICODIN) 5-325 MG tablet Take 1 tablet by mouth every 4 (four) hours as needed for moderate pain. 02/03/18    Viriginia Amendola, Delorise Royals, PA-C  hydrOXYzine (ATARAX/VISTARIL) 50 MG tablet Take 50 mg by mouth every 6 (six) hours as needed.     [provider]  lidocaine (XYLOCAINE) 5 % ointment Apply topically daily as needed (once daily for leg pain). Patient not taking: Reported on 01/22/2017 07/26/16   Pucilowska, Braulio Conte B, MD  loperamide (LOPERAMIDE A-D) 2 MG tablet Take 1 tablet (2 mg total) by mouth 4 (four) times daily as needed for diarrhea or loose stools. Patient not taking: Reported on 01/22/2017 07/07/16   Merrily Brittle, MD  meloxicam (MOBIC) 15 MG tablet Take 1 tablet (15 mg total) by mouth daily. 02/03/18   Shritha Bresee, Delorise Royals, PA-C  ondansetron (ZOFRAN-ODT) 4 MG disintegrating tablet Take 1 tablet (4 mg total) by mouth every 8 (eight) hours as needed for nausea or vomiting. 02/03/18   Aundray Cartlidge, Delorise Royals, PA-C  QUEtiapine (SEROQUEL) 100 MG tablet Take 1 tablet (100 mg total) by mouth at bedtime. Patient not taking: Reported on 01/22/2017 07/26/16   Pucilowska, Ellin Goodie, MD  QUEtiapine (SEROQUEL) 25 MG tablet Take 1 tablet (25 mg total) by mouth 3 (three) times daily as needed (anxiety). Patient not taking: Reported on 01/22/2017 07/26/16   Pucilowska, Ellin Goodie, MD  traMADol (ULTRAM) 50 MG tablet Take 1 tablet (50 mg total) every 6 (six) hours as needed by mouth. 03/01/17 03/01/18  Loleta Rose, MD  vitamin B-12 1000 MCG tablet Take 1 tablet (1,000 mcg total) by mouth daily. Patient not taking: Reported on 01/22/2017 07/26/16   Pucilowska, Braulio Conte B, MD  zolpidem (AMBIEN) 10 MG tablet Take 10 mg by mouth at bedtime as needed for sleep.    [provider]    Allergies Ciprofloxacin hcl; Fentanyl; Haldol [haloperidol]; Morphine and related; Phenergan [promethazine hcl]; Propofol; and Vicodin [hydrocodone-acetaminophen]  Family History  Problem Relation Age of Onset  . Fibromyalgia Mother     Social History Social History   Tobacco Use  . Smoking status: Current Every Day Smoker     Packs/day: 1.00    Years: 40.00    Pack years: 40.00    Types: Cigarettes  . Smokeless tobacco: Current User  Substance Use Topics  . Alcohol use: No  . Drug use: No     Review of Systems  Constitutional: No fever/chills Eyes: No visual changes. No discharge ENT: No upper respiratory complaints. Cardiovascular: no chest pain. Respiratory: no cough. No SOB. Gastrointestinal: No abdominal pain.  No nausea, no vomiting.  Musculoskeletal: Positive for right ankle injury/pain Skin: Negative for rash, abrasions, lacerations, ecchymosis. Neurological: Negative for headaches, focal weakness or numbness. 10-point ROS otherwise negative.  ____________________________________________   PHYSICAL EXAM:  VITAL SIGNS: ED Triage Vitals  Enc Vitals Group     BP 02/03/18 1354 110/85     Pulse Rate 02/03/18 1354 69     Resp 02/03/18 1354 18     Temp 02/03/18 1354 98.6 F (37 C)     Temp Source 02/03/18 1354 Oral  SpO2 02/03/18 1354 99 %     Weight 02/03/18 1347 152 lb (68.9 kg)     Height 02/03/18 1347 5\' 5"  (1.651 m)     Head Circumference --      Peak Flow --      Pain Score 02/03/18 1346 10     Pain Loc --      Pain Edu? --      Excl. in GC? --      Constitutional: Alert and oriented. Well appearing and in no acute distress. Eyes: Conjunctivae are normal. PERRL. EOMI. Head: Atraumatic. Neck: No stridor.    Cardiovascular: Normal rate, regular rhythm. Normal S1 and S2.  Good peripheral circulation. Respiratory: Normal respiratory effort without tachypnea or retractions. Lungs CTAB. Good air entry to the bases with no decreased or absent breath sounds. Musculoskeletal: Full range of motion to all extremities. No gross deformities appreciated.  Visualization of the right ankle reveals mild edema over the lateral malleolus.  No acute deformity.  No ecchymosis.  Patient with limited range of motion due to pain.  Very tender to palpation over the lateral malleolus with no  palpable abnormality or deficit.  Dorsalis pedis pulse intact.  Sensation intact x5 digits. Neurologic:  Normal speech and language. No gross focal neurologic deficits are appreciated.  Skin:  Skin is warm, dry and intact. No rash noted. Psychiatric: Mood and affect are normal. Speech and behavior are normal. Patient exhibits appropriate insight and judgement.   ____________________________________________   LABS (all labs ordered are listed, but only abnormal results are displayed)  Labs Reviewed - No data to display ____________________________________________  EKG   ____________________________________________  RADIOLOGY I personally viewed and evaluated these images as part of my medical decision making, as well as reviewing the written report by the radiologist.  I concur with radiologist finding of transverse fracture of the lateral malleolus.  Dg Ankle Complete Right  Result Date: 02/03/2018 CLINICAL DATA:  Patient reports she got up from sitting today when she twisted her right ankle and "heard it pop." Now complaining of right ankle and right foot pain, reporting that most of her pain is located on the lateral side of right ankle. EXAM: RIGHT ANKLE - COMPLETE 3+ VIEW COMPARISON:  None. FINDINGS: There is an acute transverse fracture of the LATERAL malleolus, not associated with dislocation. There is minimal soft tissue swelling along the LATERAL aspect of the ankle. IMPRESSION: Transverse fracture of the LATERAL malleolus. Electronically Signed   By: Norva Pavlov M.D.   On: 02/03/2018 14:27   Dg Foot Complete Right  Result Date: 02/03/2018 CLINICAL DATA:  Patient reports she got up from sitting today when she twisted her right ankle and "heard it pop." Now complaining of right ankle and right foot pain, reporting that most of her pain is located on the lateral side of right ankle. EXAM: RIGHT FOOT COMPLETE - 3+ VIEW COMPARISON:  02/03/2018 ankle FINDINGS: Hallux valgus  deformity. There is a transverse fracture of the LATERAL malleolus, associated soft tissue swelling. No other acute abnormality identified. IMPRESSION: Acute fracture of the LATERAL malleolus. Hallux valgus deformity. Electronically Signed   By: Norva Pavlov M.D.   On: 02/03/2018 14:29    ____________________________________________    PROCEDURES  Procedure(s) performed:    .Splint Application Date/Time: 02/03/2018 2:43 PM Performed by: Racheal Patches, PA-C Authorized by: Racheal Patches, PA-C   Consent:    Consent obtained:  Verbal   Consent given by:  Patient   Risks  discussed:  Pain and swelling Pre-procedure details:    Sensation:  Normal Procedure details:    Laterality:  Right   Location:  Ankle   Ankle:  R ankle   Splint type:  Short leg   Supplies:  Cotton padding, Ortho-Glass and elastic bandage Post-procedure details:    Pain:  Improved   Sensation:  Normal   Patient tolerance of procedure:  Tolerated well, no immediate complications      Medications  oxyCODONE-acetaminophen (PERCOCET/ROXICET) 5-325 MG per tablet 1 tablet (has no administration in time range)  ondansetron (ZOFRAN-ODT) disintegrating tablet 8 mg (has no administration in time range)     ____________________________________________   INITIAL IMPRESSION / ASSESSMENT AND PLAN / ED COURSE  Pertinent labs & imaging results that were available during my care of the patient were reviewed by me and considered in my medical decision making (see chart for details).  Review of the [place] CSRS was performed in accordance of the [facility] prior to dispensing any controlled drugs.      Patient's diagnosis is consistent with distal fibula/malleolus fracture.  Patient presents emergency department with sharp right ankle pain after injury.  X-ray reveals fracture of the distal fibula/lateral malleolus.  Nondisplaced.  Splint applied as described above.  Patient will be prescribed a limited pain  medication for symptom improvement as well as meloxicam for additional symptom improvement.  Patient has crutches at home that she will use to ambulate.  Follow-up with orthopedics. Patient is given ED precautions to return to the ED for any worsening or new symptoms.     ____________________________________________  FINAL CLINICAL IMPRESSION(S) / ED DIAGNOSES  Final diagnoses:  Other closed fracture of distal end of right fibula, initial encounter      NEW MEDICATIONS STARTED DURING THIS VISIT:  ED Discharge Orders         Ordered    meloxicam (MOBIC) 15 MG tablet  Daily     02/03/18 1447    ondansetron (ZOFRAN-ODT) 4 MG disintegrating tablet  Every 8 hours PRN     02/03/18 1447    HYDROcodone-acetaminophen (NORCO/VICODIN) 5-325 MG tablet  Every 4 hours PRN     02/03/18 1447              This chart was dictated using voice recognition software/Dragon. Despite best efforts to proofread, errors can occur which can change the meaning. Any change was purely unintentional.    Racheal Patches, PA-C 02/03/18 1447    Dionne Bucy, MD 02/03/18 [phone]

## 2018-03-30 IMAGING — RF DG UGI W/O KUB
10 of 16 series · 12 of 22 positions shown · non-contrast
Comparison: None.

CLINICAL DATA: Left upper quadrant pain, nausea, loss of appetite
for 2 months

EXAM:
UPPER GI SERIES WITHOUT KUB
TECHNIQUE: Routine upper GI series was performed with thin and thick barium.
FLUOROSCOPY TIME:  Fluoroscopy Time:  0.8 minute
Radiation Exposure Index (if provided by the fluoroscopic device):
0.2 mGy
Number of Acquired Spot Images: 0

[Series 1: cp_standard · 0.51mm/px · 2 of 91 frames shown (1 of 10)]
[frame 14/91]
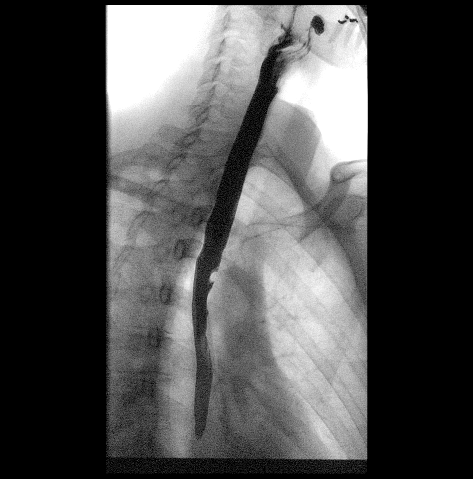
[frame 60/91]
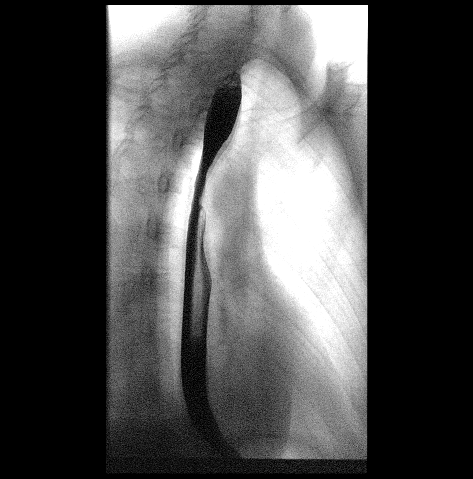

[Series 2: cp_standard · 0.26mm/px · 1 of 1 slices shown (2 of 10)]
[im 1/1]
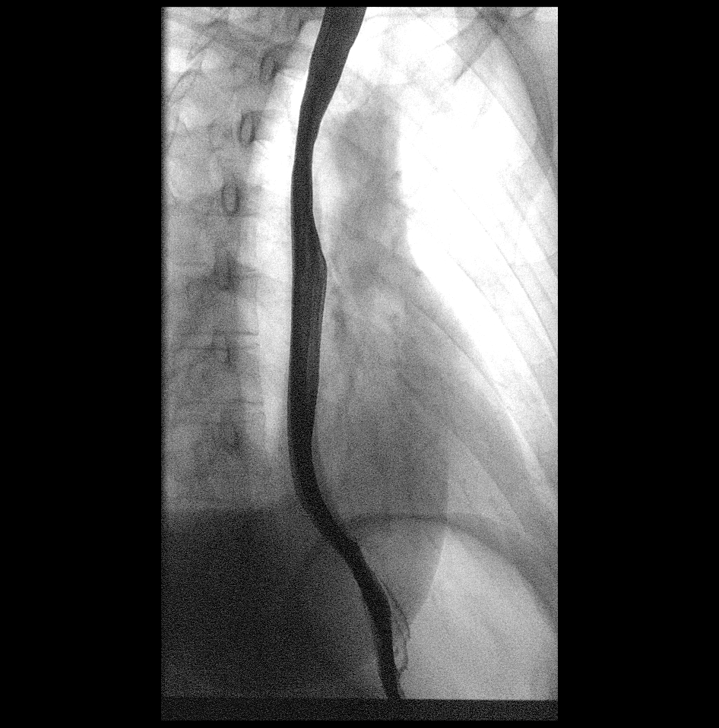

[Series 4: cp_standard · 0.26mm/px · 1 of 1 slices shown (3 of 10)]
[im 1/1]
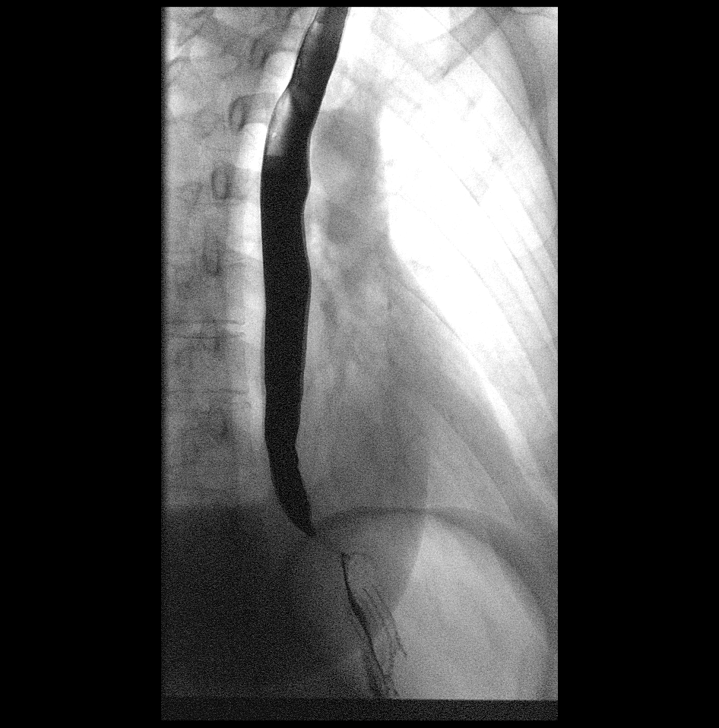

[Series 6: cp_standard · 0.27mm/px · 1 of 1 slices shown (4 of 10)]
[im 1/1]
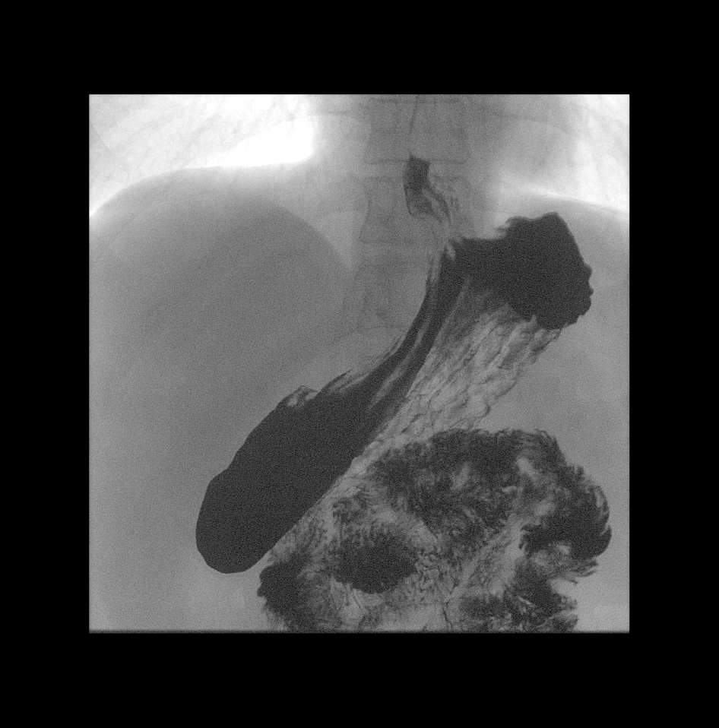

[Series 8: cp_standard · 0.27mm/px · 1 of 1 slices shown (5 of 10)]
[im 1/1]
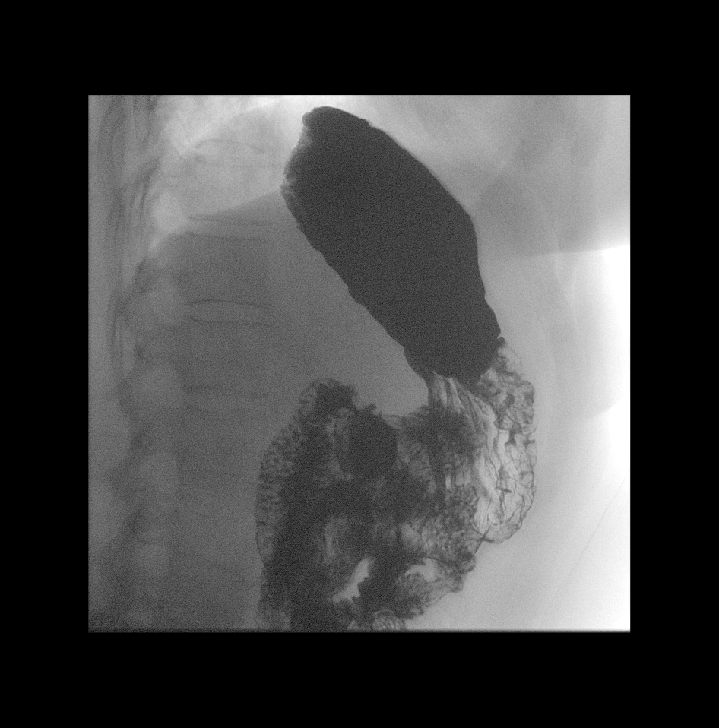

[Series 9: cp_standard · 0.27mm/px · 1 of 1 slices shown (6 of 10)]
[im 1/1]
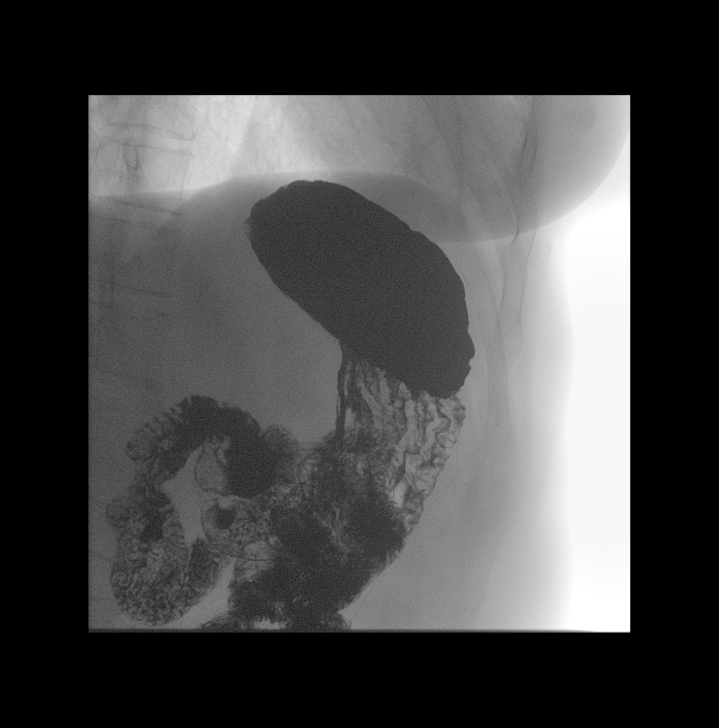

[Series 11: cp_standard · 0.27mm/px · 1 of 1 slices shown (7 of 10)]
[im 1/1]
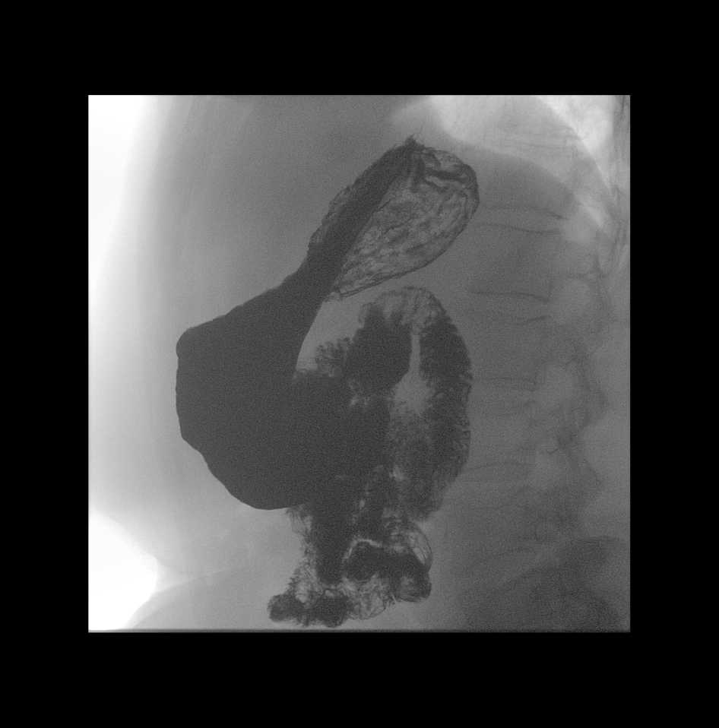

[Series 13: cp_standard · 0.55mm/px · 2 of 58 frames shown (8 of 10)]
[frame 9/58]
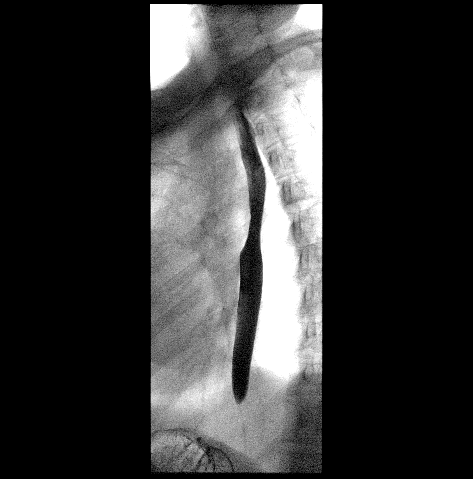
[frame 45/58]
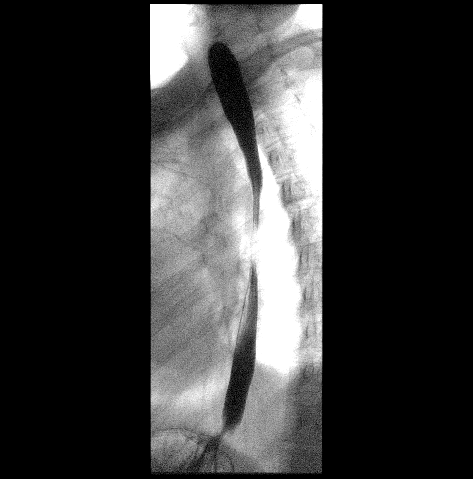

[Series 14: cp_standard · 0.28mm/px · 1 of 1 slices shown (9 of 10)]
[im 1/1]
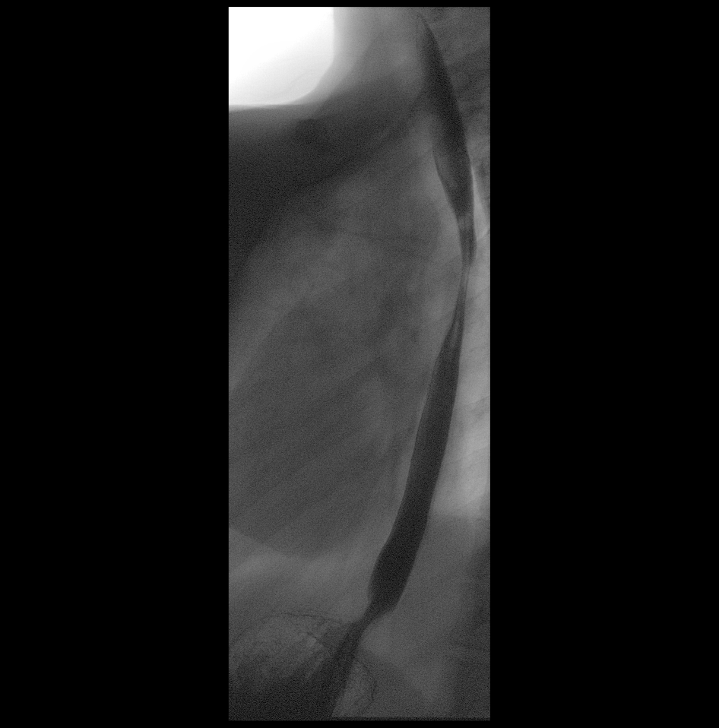

[Series 16: cp_standard · 0.28mm/px · 1 of 1 slices shown (10 of 10)]
[im 1/1]
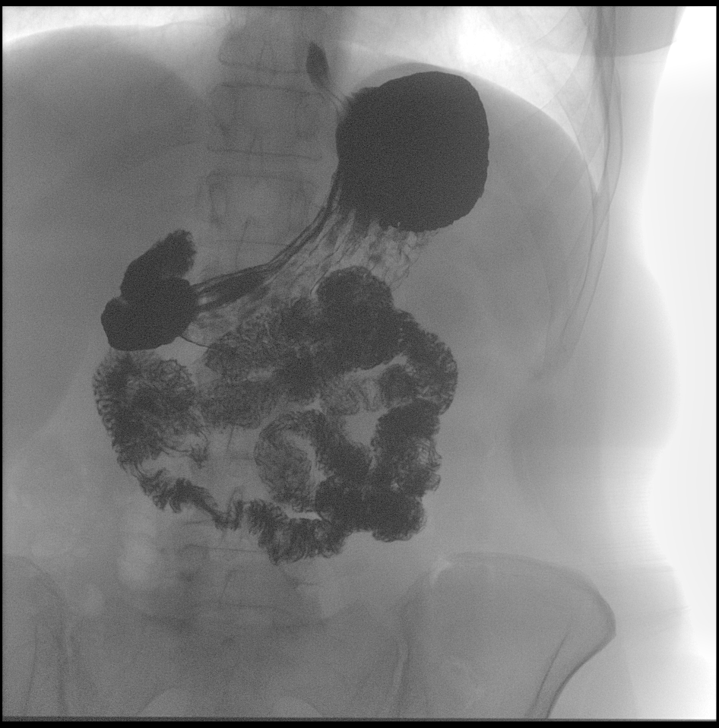

[12 of 22 positions shown; findings below may reference images not displayed]

FINDINGS: Examination of the esophagus demonstrated normal esophageal
motility. Normal esophageal morphology without evidence of
esophagitis or ulceration. No esophageal stricture, diverticula, or
mass lesion. Small hiatal hernia. Mild gastroesophageal reflux.

Examination of the stomach demonstrated mild thickening of the rugal
folds. The gastric mucosa appeared unremarkable without evidence of
ulceration, scarring, or mass lesion. Gastric motility and emptying
was normal. Fluoroscopic examination of the duodenum demonstrates
normal motility and morphology without evidence of ulceration or
mass lesion.
IMPRESSION: 1. Small hiatal hernia with mild gastroesophageal reflux.
2. Findings concerning for mild gastritis.

## 2018-11-16 ENCOUNTER — Other Ambulatory Visit: Payer: Self-pay

## 2018-11-16 ENCOUNTER — Emergency Department
Admission: EM | Admit: 2018-11-16 | Discharge: 2018-11-16 | Disposition: A | Discharge status: Home / Self Care | Payer: Medicaid Other | Attending: Emergency Medicine | Admitting: Emergency Medicine

## 2018-11-16 ENCOUNTER — Emergency Department: Payer: Medicaid Other

## 2018-11-16 ENCOUNTER — Encounter: Payer: Self-pay | Admitting: Emergency Medicine

## 2018-11-16 DIAGNOSIS — M940 Chondrocostal junction syndrome [Tietze]: Secondary | ICD-10-CM | POA: Insufficient documentation

## 2018-11-16 DIAGNOSIS — F17210 Nicotine dependence, cigarettes, uncomplicated: Secondary | ICD-10-CM | POA: Diagnosis not present

## 2018-11-16 DIAGNOSIS — J45909 Unspecified asthma, uncomplicated: Secondary | ICD-10-CM | POA: Insufficient documentation

## 2018-11-16 DIAGNOSIS — R0789 Other chest pain: Secondary | ICD-10-CM | POA: Diagnosis present

## 2018-11-16 DIAGNOSIS — Z79899 Other long term (current) drug therapy: Secondary | ICD-10-CM | POA: Insufficient documentation

## 2018-11-16 DIAGNOSIS — F17220 Nicotine dependence, chewing tobacco, uncomplicated: Secondary | ICD-10-CM | POA: Insufficient documentation

## 2018-11-16 DIAGNOSIS — J449 Chronic obstructive pulmonary disease, unspecified: Secondary | ICD-10-CM | POA: Diagnosis not present

## 2018-11-16 MED ORDER — METHYLPREDNISOLONE 4 MG PO TBPK: ORAL_TABLET | ORAL | 0 refills | Status: DC

## 2018-11-16 MED ORDER — LIDOCAINE 5 % EX PTCH
1.0000 | MEDICATED_PATCH | CUTANEOUS | Status: DC
  Administered 2018-11-16: 1 via TRANSDERMAL
  Filled 2018-11-16: qty 1

## 2018-11-16 MED ORDER — TRAMADOL HCL 50 MG PO TABS: 50.0000 mg | ORAL_TABLET | Freq: Four times a day (QID) | ORAL | 0 refills | Status: AC | PRN

## 2018-11-16 NOTE — ED Provider Notes (Signed)
[facility] Emergency Department Provider Note   ____________________________________________   First MD Initiated Contact with Patient 11/16/18 1008     (approximate)  I have reviewed the triage vital signs and the nursing notes.   HISTORY  Chief Complaint Rib Cage pain    HPI Shawna [name] is a 53 y.o. female patient complain of left rib pain for 2 days.  Patient onset of complaint was after she was pushing to close a door and felt a "pop on the left side.  Patient states continued pain since incident.  Patient state pain increased with deep breathing.  Patient rates the pain as a 10/10.  Patient described the pain is "achy".  No relief with over-the-counter anti-inflammatory medications.         Past Medical History:  Diagnosis Date  . Anxiety   . Asthma   . Bulging lumbar disc   . Bulging lumbar disc   . GERD (gastroesophageal reflux disease)   . Insomnia   . Kidney stone   . Pneumonia DEC 2016  . Sciatic leg pain    Right side  . Sciatica of right side   . Weakness    Bil knees, and ankles    Patient Active Problem List   Diagnosis Date Noted  . Visual hallucinations 07/30/2017  . Colitis 01/22/2017  . Dyslipidemia 07/26/2016  . Low vitamin B12 level 07/26/2016  . Major depressive disorder, recurrent severe without psychotic features ([place]) 07/23/2016  . Overdose of sedative or hypnotic 07/23/2016  . COPD exacerbation ([place]) 07/23/2016  . GERD (gastroesophageal reflux disease) 07/23/2016  . Cannabis use disorder, moderate, dependence ([place]) 07/23/2016  . Tobacco use disorder 07/23/2016  . Sedative, hypnotic or anxiolytic use disorder, severe, dependence ([place]) 07/23/2016  . Bacterial pneumonia 01/23/2015  . Leukocytosis 01/23/2015  . Sepsis ([place]) 01/23/2015  . Hypoxemia 01/23/2015  . Opioid use disorder, moderate, dependence ([place]) 12/31/2014  . Anxiety disorder, unspecified 12/31/2014    Past Surgical History:  Procedure  Laterality Date  . ABDOMINAL HYSTERECTOMY     Partial  . ESOPHAGOGASTRODUODENOSCOPY (EGD) WITH PROPOFOL N/A 11/27/2015   Procedure: ESOPHAGOGASTRODUODENOSCOPY (EGD) WITH PROPOFOL;  Surgeon: Lollie Sails, MD;  Location: [facility] ENDOSCOPY;  Service: Endoscopy;  Laterality: N/A;  . SHOULDER SURGERY Right   . TONSILLECTOMY    . TUBAL LIGATION      Prior to Admission medications   Medication Sig Start Date End Date Taking? Authorizing Provider  acetaminophen (TYLENOL) 500 MG tablet Take 500 mg by mouth every 6 (six) hours as needed.    [provider]  albuterol (PROVENTIL HFA;VENTOLIN HFA) 108 (90 Base) MCG/ACT inhaler Inhale 2 puffs into the lungs every 6 (six) hours as needed for wheezing or shortness of breath. 01/16/16   Daymon Larsen, MD  clonazePAM (KLONOPIN) 0.5 MG tablet Take 0.5 mg by mouth 2 (two) times daily as needed for anxiety.    [provider]  dexlansoprazole (DEXILANT) 60 MG capsule Take 1 capsule by mouth daily.    [provider]  DULoxetine 40 MG CPEP Take 40 mg by mouth daily. 07/27/16   Pucilowska, Jolanta B, MD  fexofenadine (ALLEGRA) 180 MG tablet Take 180 mg by mouth daily.    [provider]  fluticasone (FLONASE) 50 MCG/ACT nasal spray Place 2 sprays into both nostrils daily.     [provider]  hydrOXYzine (ATARAX/VISTARIL) 50 MG tablet Take 50 mg by mouth every 6 (six) hours as needed.  [provider]  methylPREDNISolone (MEDROL DOSEPAK) 4 MG TBPK tablet Take Tapered dose as directed 11/16/18   Joni ReiningSmith,  K, PA-C  ondansetron (ZOFRAN-ODT) 4 MG disintegrating tablet Take 1 tablet (4 mg total) by mouth every 8 (eight) hours as needed for nausea or vomiting. 02/03/18   Cuthriell, Delorise RoyalsJonathan D, PA-C  traMADol (ULTRAM) 50 MG tablet Take 1 tablet (50 mg total) by mouth every 6 (six) hours as needed. 11/16/18 11/16/19  Joni ReiningSmith,  K, PA-C  zolpidem (AMBIEN) 10 MG tablet Take 10 mg by mouth at bedtime as needed for  sleep.    [provider]    Allergies Ciprofloxacin hcl, Fentanyl, Haldol [haloperidol], Morphine and related, Phenergan [promethazine hcl], Propofol, and Vicodin [hydrocodone-acetaminophen]  Family History  Problem Relation Age of Onset  . Fibromyalgia Mother     Social History Social History   Tobacco Use  . Smoking status: Current Every Day Smoker    Packs/day: 1.00    Years: 40.00    Pack years: 40.00    Types: Cigarettes  . Smokeless tobacco: Current User  Substance Use Topics  . Alcohol use: No  . Drug use: No    Review of Systems  Constitutional: No fever/chills Eyes: No visual changes. ENT: No sore throat. Cardiovascular: Denies chest pain. Respiratory: Denies shortness of breath. Gastrointestinal: No abdominal pain.  No nausea, no vomiting.  No diarrhea.  No constipation. Genitourinary: Negative for dysuria. Musculoskeletal: Left anterior lateral rib pain.   Skin: Negative for rash. Neurological: Negative for headaches, focal weakness or numbness. Psychiatric:  Anxiety. Allergic/Immunilogical: See extensive allergy list ____________________________________________   PHYSICAL EXAM:  VITAL SIGNS: ED Triage Vitals  Enc Vitals Group     BP 11/16/18 0959 (!) 144/69     Pulse Rate 11/16/18 0959 84     Resp 11/16/18 0959 16     Temp 11/16/18 0959 98.4 F (36.9 C)     Temp Source 11/16/18 0959 Oral     SpO2 11/16/18 0959 96 %     Weight 11/16/18 1000 155 lb (70.3 kg)     Height 11/16/18 1000 5\' 6"  (1.676 m)     Head Circumference --      Peak Flow --      Pain Score 11/16/18 0959 10     Pain Loc --      Pain Edu? --      Excl. in GC? --     Constitutional: Alert and oriented. Well appearing and in no acute distress. Cardiovascular: Normal rate, regular rhythm. Grossly normal heart sounds.  Good peripheral circulation. Respiratory: Mild splinting with  respiratory effort.  No retractions. Lungs CTAB. Gastrointestinal: Soft and nontender.  No distention. No abdominal bruits. No CVA tenderness. Neurologic:  Normal speech and language. No gross focal neurologic deficits are appreciated. No gait instability. Skin:  Skin is warm, dry and intact. No rash noted. Psychiatric: Mood and affect are normal. Speech and behavior are normal.  ____________________________________________   LABS (all labs ordered are listed, but only abnormal results are displayed)  Labs Reviewed - No data to display ____________________________________________  EKG   ____________________________________________  RADIOLOGY  ED MD interpretation:    Official radiology report(s): Dg Chest 2 View  Result Date: 11/16/2018 CLINICAL DATA:  Left-sided pain. EXAM: CHEST - 2 VIEW COMPARISON:  February 20, 2017 FINDINGS: A hair tie projects over the upper left chest, noted to be anteriorly located on the lateral view. No pneumothorax. The heart, hila, mediastinum, and pleura are normal. Linear  opacity in the left lateral mid lung consistent with scar or atelectasis. Mild opacity in the left lateral base is identified. No other acute abnormalities. IMPRESSION: 1. Scar or atelectasis in the lateral left mid lung. 2. Mild opacity in the lateral left lung base favored represent atelectasis. No suspicious infiltrates. No fractures are seen. Electronically Signed   By: Gerome Samavid  Williams III M.D   On: 11/16/2018 10:46    ____________________________________________   PROCEDURES  Procedure(s) performed (including Critical Care):  Procedures   ____________________________________________   INITIAL IMPRESSION / ASSESSMENT AND PLAN / ED COURSE  As part of my medical decision making, I reviewed the following data within the electronic MEDICAL RECORD NUMBER         Shawna [name] was evaluated in Emergency Department on 11/16/2018 for the symptoms described in the history of present illness. She was evaluated in the context of the global COVID-19 pandemic, which  necessitated consideration that the patient might be at risk for infection with the SARS-CoV-2 virus that causes COVID-19. Institutional protocols and algorithms that pertain to the evaluation of patients at risk for COVID-19 are in a state of rapid change based on information released by regulatory bodies including the CDC and federal and state organizations. These policies and algorithms were followed during the patient's care in the ED.   Patient presents with left anterior lateral rib pain secondary to a pushing incident.  Physical exam was grossly unremarkable.  Discussed x-ray findings with patient.  Patient given discharge care instruction advised take medication as directed.  Patient advised follow-up PCP in 1 week.  Return to ED if condition worsens.      ____________________________________________   FINAL CLINICAL IMPRESSION(S) / ED DIAGNOSES  Final diagnoses:  Costochondritis, acute     ED Discharge Orders         Ordered    traMADol (ULTRAM) 50 MG tablet  Every 6 hours PRN     11/16/18 1105    methylPREDNISolone (MEDROL DOSEPAK) 4 MG TBPK tablet     11/16/18 1105           Note:  This document was prepared using Dragon voice recognition software and may include unintentional dictation errors.    Joni ReiningSmith,  K, PA-C 11/16/18 1111    Sharman CheekStafford, Phillip, MD 11/16/18 [phone]

## 2018-11-16 NOTE — ED Triage Notes (Signed)
Pt states 2 days ago she was pushing door closed and felt a "pop" on her left side. Pt states since incident she has had pain on her left side (rib cage).

## 2018-11-16 NOTE — Discharge Instructions (Addendum)
Follow discharge care instruction use heat instead of ice as directed.

## 2018-11-16 NOTE — ED Notes (Signed)
See triage note  Presents with pain to left rib area  States she has a door that is hard to open and close  She pushed against it 2 days ago and felt "pop" to rib area

## 2019-02-28 ENCOUNTER — Other Ambulatory Visit: Payer: Self-pay

## 2019-02-28 ENCOUNTER — Emergency Department: Payer: Medicaid Other

## 2019-02-28 ENCOUNTER — Emergency Department
Admission: EM | Admit: 2019-02-28 | Discharge: 2019-02-28 | Disposition: A | Discharge status: Home / Self Care | Payer: Medicaid Other | Attending: Emergency Medicine | Admitting: Emergency Medicine

## 2019-02-28 DIAGNOSIS — K2900 Acute gastritis without bleeding: Secondary | ICD-10-CM

## 2019-02-28 DIAGNOSIS — E860 Dehydration: Secondary | ICD-10-CM

## 2019-02-28 DIAGNOSIS — Z79899 Other long term (current) drug therapy: Secondary | ICD-10-CM | POA: Diagnosis not present

## 2019-02-28 DIAGNOSIS — F17210 Nicotine dependence, cigarettes, uncomplicated: Secondary | ICD-10-CM | POA: Diagnosis not present

## 2019-02-28 DIAGNOSIS — J45909 Unspecified asthma, uncomplicated: Secondary | ICD-10-CM | POA: Diagnosis not present

## 2019-02-28 DIAGNOSIS — J449 Chronic obstructive pulmonary disease, unspecified: Secondary | ICD-10-CM | POA: Insufficient documentation

## 2019-02-28 DIAGNOSIS — R1013 Epigastric pain: Secondary | ICD-10-CM | POA: Diagnosis present

## 2019-02-28 LAB — CBC WITH DIFFERENTIAL/PLATELET
Abs Immature Granulocytes: 0.03 10*3/uL (ref 0.00–0.07)
Basophils Absolute: 0 10*3/uL (ref 0.0–0.1)
Basophils Relative: 0 %
Eosinophils Absolute: 0.1 10*3/uL (ref 0.0–0.5)
Eosinophils Relative: 1 %
HCT: 40.2 % (ref 36.0–46.0)
Hemoglobin: 13.4 g/dL (ref 12.0–15.0)
Immature Granulocytes: 0 %
Lymphocytes Relative: 25 %
Lymphs Abs: 2.3 10*3/uL (ref 0.7–4.0)
MCH: 29.2 pg (ref 26.0–34.0)
MCHC: 33.3 g/dL (ref 30.0–36.0)
MCV: 87.6 fL (ref 80.0–100.0)
Monocytes Absolute: 0.4 10*3/uL (ref 0.1–1.0)
Monocytes Relative: 5 %
Neutro Abs: 6.2 10*3/uL (ref 1.7–7.7)
Neutrophils Relative %: 69 %
Platelets: 375 10*3/uL (ref 150–400)
RBC: 4.59 MIL/uL (ref 3.87–5.11)
RDW: 13.9 % (ref 11.5–15.5)
WBC: 9.1 10*3/uL (ref 4.0–10.5)
nRBC: 0 % (ref 0.0–0.2)

## 2019-02-28 LAB — COMPREHENSIVE METABOLIC PANEL
ALT: 16 U/L (ref 0–44)
AST: 20 U/L (ref 15–41)
Albumin: 3.9 g/dL (ref 3.5–5.0)
Alkaline Phosphatase: 107 U/L (ref 38–126)
Anion gap: 10 (ref 5–15)
BUN: 13 mg/dL (ref 6–20)
CO2: 24 mmol/L (ref 22–32)
Calcium: 9.3 mg/dL (ref 8.9–10.3)
Chloride: 106 mmol/L (ref 98–111)
Creatinine, Ser: 0.94 mg/dL (ref 0.44–1.00)
GFR calc Af Amer: >60 mL/min (ref >60)
GFR calc non Af Amer: >60 mL/min (ref >60)
Glucose, Bld: 132 mg/dL — ABNORMAL HIGH (ref 70–99)
Potassium: 4.1 mmol/L (ref 3.5–5.1)
Sodium: 140 mmol/L (ref 135–145)
Total Bilirubin: 0.7 mg/dL (ref 0.3–1.2)
Total Protein: 7.4 g/dL (ref 6.5–8.1)

## 2019-02-28 LAB — URINALYSIS, COMPLETE (UACMP) WITH MICROSCOPIC
Bacteria, UA: NONE SEEN
Bilirubin Urine: NEGATIVE
Glucose, UA: NEGATIVE mg/dL
Hgb urine dipstick: NEGATIVE
Ketones, ur: NEGATIVE mg/dL
Leukocytes,Ua: NEGATIVE
Nitrite: NEGATIVE
Protein, ur: NEGATIVE mg/dL
Specific Gravity, Urine: 1.009 (ref 1.005–1.030)
pH: 7 (ref 5.0–8.0)

## 2019-02-28 LAB — LIPASE, BLOOD: Lipase: 18 U/L (ref 11–51)

## 2019-02-28 MED ORDER — KETOROLAC TROMETHAMINE 30 MG/ML IJ SOLN
15.0000 mg | INTRAMUSCULAR | Status: AC
  Administered 2019-02-28: 15 mg via INTRAVENOUS
  Filled 2019-02-28: qty 1

## 2019-02-28 MED ORDER — ALUMINUM-MAGNESIUM-SIMETHICONE 200-200-20 MG/5ML PO SUSP: 30.0000 mL | Freq: Three times a day (TID) | ORAL | 0 refills | Status: DC

## 2019-02-28 MED ORDER — FAMOTIDINE 20 MG PO TABS: 20.0000 mg | ORAL_TABLET | Freq: Two times a day (BID) | ORAL | 0 refills | Status: DC

## 2019-02-28 MED ORDER — ALUM & MAG HYDROXIDE-SIMETH 200-200-20 MG/5ML PO SUSP
30.0000 mL | Freq: Once | ORAL | Status: AC
  Administered 2019-02-28: 13:00:00 30 mL via ORAL
  Filled 2019-02-28: qty 30

## 2019-02-28 MED ORDER — METOCLOPRAMIDE HCL 5 MG/ML IJ SOLN
10.0000 mg | Freq: Once | INTRAMUSCULAR | Status: AC
  Administered 2019-02-28: 13:00:00 10 mg via INTRAVENOUS
  Filled 2019-02-28: qty 2

## 2019-02-28 MED ORDER — ONDANSETRON HCL 4 MG/2ML IJ SOLN
4.0000 mg | Freq: Once | INTRAMUSCULAR | Status: AC
  Administered 2019-02-28: 4 mg via INTRAVENOUS
  Filled 2019-02-28: qty 2

## 2019-02-28 MED ORDER — FAMOTIDINE IN NACL 20-0.9 MG/50ML-% IV SOLN
20.0000 mg | Freq: Once | INTRAVENOUS | Status: AC
  Administered 2019-02-28: 13:00:00 20 mg via INTRAVENOUS
  Filled 2019-02-28: qty 50

## 2019-02-28 MED ORDER — SODIUM CHLORIDE 0.9 % IV BOLUS
1000.0000 mL | Freq: Once | INTRAVENOUS | Status: AC
  Administered 2019-02-28: 1000 mL via INTRAVENOUS

## 2019-02-28 MED ORDER — METOCLOPRAMIDE HCL 10 MG PO TABS: 10.0000 mg | ORAL_TABLET | Freq: Four times a day (QID) | ORAL | 0 refills | Status: DC | PRN

## 2019-02-28 MED ORDER — DIPHENHYDRAMINE HCL 25 MG PO CAPS
25.0000 mg | ORAL_CAPSULE | Freq: Once | ORAL | Status: AC
  Administered 2019-02-28: 14:00:00 25 mg via ORAL
  Filled 2019-02-28: qty 1

## 2019-02-28 NOTE — ED Triage Notes (Signed)
Pt arrives from home via EMS. Pt reports eating canned meat Friday and n/v/chills since then. Pt denies diarrhea, headaches. Slight bodyaches.  20G R hand NS running BS 147

## 2019-02-28 NOTE — ED Notes (Signed)
MD made aware pt became antsy after administration of reglan.

## 2019-02-28 NOTE — ED Notes (Signed)
Pt ambulated to toilet at this time. Pt placed back on monitor.

## 2019-02-28 NOTE — ED Provider Notes (Signed)
[facility] Emergency Department Provider Note  ____________________________________________  Time seen: Approximately 12:55 PM  I have reviewed the triage vital signs and the nursing notes.   HISTORY  Chief Complaint Nausea    HPI Shawna [name] is a 53 y.o. female with a history of GERD kidney stones and sciatica who comes to the ED complaining of upper abdominal pain , nausea and vomiting since eating canned meat 3 days ago.  Pain is nonradiating, worse with eating, no alleviating factors.  Moderate intensity.  No diarrhea or constipation.  No fever or sick contacts.    Past Medical History:  Diagnosis Date  . Anxiety   . Asthma   . Bulging lumbar disc   . Bulging lumbar disc   . GERD (gastroesophageal reflux disease)   . Insomnia   . Kidney stone   . Pneumonia DEC 2016  . Sciatic leg pain    Right side  . Sciatica of right side   . Weakness    Bil knees, and ankles     Patient Active Problem List   Diagnosis Date Noted  . Visual hallucinations 07/30/2017  . Colitis 01/22/2017  . Dyslipidemia 07/26/2016  . Low vitamin B12 level 07/26/2016  . Major depressive disorder, recurrent severe without psychotic features ([place]) 07/23/2016  . Overdose of sedative or hypnotic 07/23/2016  . COPD exacerbation ([place]) 07/23/2016  . GERD (gastroesophageal reflux disease) 07/23/2016  . Cannabis use disorder, moderate, dependence ([place]) 07/23/2016  . Tobacco use disorder 07/23/2016  . Sedative, hypnotic or anxiolytic use disorder, severe, dependence ([place]) 07/23/2016  . Bacterial pneumonia 01/23/2015  . Leukocytosis 01/23/2015  . Sepsis ([place]) 01/23/2015  . Hypoxemia 01/23/2015  . Opioid use disorder, moderate, dependence ([place]) 12/31/2014  . Anxiety disorder, unspecified 12/31/2014     Past Surgical History:  Procedure Laterality Date  . ABDOMINAL HYSTERECTOMY     Partial  . ESOPHAGOGASTRODUODENOSCOPY (EGD) WITH PROPOFOL N/A 11/27/2015   Procedure:  ESOPHAGOGASTRODUODENOSCOPY (EGD) WITH PROPOFOL;  Surgeon: Lollie Sails, MD;  Location: [facility] ENDOSCOPY;  Service: Endoscopy;  Laterality: N/A;  . SHOULDER SURGERY Right   . TONSILLECTOMY    . TUBAL LIGATION       Prior to Admission medications   Medication Sig Start Date End Date Taking? Authorizing Provider  acetaminophen (TYLENOL) 500 MG tablet Take 500 mg by mouth every 6 (six) hours as needed.    [provider]  albuterol (PROVENTIL HFA;VENTOLIN HFA) 108 (90 Base) MCG/ACT inhaler Inhale 2 puffs into the lungs every 6 (six) hours as needed for wheezing or shortness of breath. 01/16/16   Daymon Larsen, MD  aluminum-magnesium hydroxide-simethicone (MAALOX) 200-200-20 MG/5ML SUSP Take 30 mLs by mouth 4 (four) times daily -  before meals and at bedtime. 02/28/19   Carrie Mew, MD  clonazePAM (KLONOPIN) 0.5 MG tablet Take 0.5 mg by mouth 2 (two) times daily as needed for anxiety.    [provider]  dexlansoprazole (DEXILANT) 60 MG capsule Take 1 capsule by mouth daily.    [provider]  DULoxetine 40 MG CPEP Take 40 mg by mouth daily. 07/27/16   Pucilowska, Herma Ard B, MD  famotidine (PEPCID) 20 MG tablet Take 1 tablet (20 mg total) by mouth 2 (two) times daily. 02/28/19   Carrie Mew, MD  fexofenadine (ALLEGRA) 180 MG tablet Take 180 mg by mouth daily.    [provider]  fluticasone (FLONASE) 50 MCG/ACT nasal spray Place 2 sprays into both nostrils daily.  [provider]  hydrOXYzine (ATARAX/VISTARIL) 50 MG tablet Take 50 mg by mouth every 6 (six) hours as needed.     [provider]  methylPREDNISolone (MEDROL DOSEPAK) 4 MG TBPK tablet Take Tapered dose as directed 11/16/18   Joni ReiningSmith, Ronald K, PA-C  metoCLOPramide (REGLAN) 10 MG tablet Take 1 tablet (10 mg total) by mouth every 6 (six) hours as needed. 02/28/19   Sharman CheekStafford, Nayleen Janosik, MD  ondansetron (ZOFRAN-ODT) 4 MG disintegrating tablet Take 1 tablet (4 mg total) by mouth  every 8 (eight) hours as needed for nausea or vomiting. 02/03/18   Cuthriell, Delorise RoyalsJonathan D, PA-C  traMADol (ULTRAM) 50 MG tablet Take 1 tablet (50 mg total) by mouth every 6 (six) hours as needed. 11/16/18 11/16/19  Joni ReiningSmith, Ronald K, PA-C  zolpidem (AMBIEN) 10 MG tablet Take 10 mg by mouth at bedtime as needed for sleep.    [provider]     Allergies Ciprofloxacin hcl, Fentanyl, Haldol [haloperidol], Morphine and related, Phenergan [promethazine hcl], Propofol, and Vicodin [hydrocodone-acetaminophen]   Family History  Problem Relation Age of Onset  . Fibromyalgia Mother     Social History Social History   Tobacco Use  . Smoking status: Current Every Day Smoker    Packs/day: 1.00    Years: 40.00    Pack years: 40.00    Types: Cigarettes  . Smokeless tobacco: Current User  Substance Use Topics  . Alcohol use: No  . Drug use: No    Review of Systems  Constitutional:   No fever  ENT:   No sore throat. No rhinorrhea. Cardiovascular:   No chest pain or syncope. Respiratory:   No dyspnea or cough. Gastrointestinal:   Positive as above for abdominal pain and vomiting.  Musculoskeletal:   Negative for focal pain or swelling All other systems reviewed and are negative except as documented above in ROS and HPI.  ____________________________________________   PHYSICAL EXAM:  VITAL SIGNS: ED Triage Vitals  Enc Vitals Group     BP 02/28/19 1038 108/72     Pulse Rate 02/28/19 1038 76     Resp 02/28/19 1038 16     Temp 02/28/19 1038 98.4 F (36.9 C)     Temp Source 02/28/19 1038 Oral     SpO2 02/28/19 1038 95 %     Weight 02/28/19 1039 160 lb (72.6 kg)     Height 02/28/19 1039 5\' 6"  (1.676 m)     Head Circumference --      Peak Flow --      Pain Score 02/28/19 1039 8     Pain Loc --      Pain Edu? --      Excl. in GC? --     Vital signs reviewed, nursing assessments reviewed.   Constitutional:   Alert and oriented. Non-toxic appearance. Eyes:   Conjunctivae  are normal. EOMI. PERRL. ENT      Head:   Normocephalic and atraumatic.      Nose:   Wearing a mask.      Mouth/Throat:   Wearing a mask.      Neck:   No meningismus. Full ROM. Hematological/Lymphatic/Immunilogical:   No cervical lymphadenopathy. Cardiovascular:   RRR. Symmetric bilateral radial and DP pulses.  No murmurs. Cap refill less than 2 seconds. Respiratory:   Normal respiratory effort without tachypnea/retractions. Breath sounds are clear and equal bilaterally. No wheezes/rales/rhonchi. Gastrointestinal:   Soft with epigastric and right upper quadrant tenderness.  Non distended. There is no CVA tenderness.  No rebound, rigidity, or guarding.  Musculoskeletal:   Normal range of motion in all extremities. No joint effusions.  No lower extremity tenderness.  No edema. Neurologic:   Normal speech and language.  Motor grossly intact. No acute focal neurologic deficits are appreciated.  Skin:    Skin is warm, dry and intact. No rash noted.  No petechiae, purpura, or bullae.  ____________________________________________    LABS (pertinent positives/negatives) (all labs ordered are listed, but only abnormal results are displayed) Labs Reviewed  COMPREHENSIVE METABOLIC PANEL - Abnormal; Notable for the following components:      Result Value   Glucose, Bld 132 (*)    All other components within normal limits  URINALYSIS, COMPLETE (UACMP) WITH MICROSCOPIC - Abnormal; Notable for the following components:   Color, Urine YELLOW (*)    APPearance CLEAR (*)    All other components within normal limits  LIPASE, BLOOD  CBC WITH DIFFERENTIAL/PLATELET   ____________________________________________   EKG    ____________________________________________    RADIOLOGY  US Abdomen Limited Ruq  Result Date: 02/28/2019 CLINICAL DATA:  Severe upper abdominal pain and vomiting. EXAM: ULTRASOUND ABDOMEN LIMITED RIGHT UPPER QUADRANT COMPARISON:  CT scan dated 08/30/2017 FINDINGS:  Gallbladder: No gallstones or wall thickening visualized. No sonographic Murphy sign noted by sonographer. Common bile duct: Diameter: 5 mm, normal. Liver: No focal lesion identified. Within normal limits in parenchymal echogenicity. Portal vein is patent on color Doppler imaging with normal direction of blood flow towards the liver. Other: None. IMPRESSION: Normal exam. Electronically Signed   By: Francene Boyers M.D.   On: 02/28/2019 11:20    ____________________________________________   PROCEDURES Procedures  ____________________________________________  DIFFERENTIAL DIAGNOSIS   Pancreatitis, gastritis, GERD, cholecystitis, choledocholithiasis, enteritis, viral syndrome  CLINICAL IMPRESSION / ASSESSMENT AND PLAN / ED COURSE  Medications ordered in the ED: Medications  alum & mag hydroxide-simeth (MAALOX/MYLANTA) 200-200-20 MG/5ML suspension 30 mL (has no administration in time range)  famotidine (PEPCID) IVPB 20 mg premix (has no administration in time range)  metoCLOPramide (REGLAN) injection 10 mg (has no administration in time range)  ondansetron (ZOFRAN) injection 4 mg (4 mg Intravenous Given 02/28/19 1049)  ketorolac (TORADOL) 30 MG/ML injection 15 mg (15 mg Intravenous Given 02/28/19 1049)  sodium chloride 0.9 % bolus 1,000 mL (1,000 mLs Intravenous New Bag/Given 02/28/19 1053)    Pertinent labs & imaging results that were available during my care of the patient were reviewed by me and considered in my medical decision making (see chart for details).  Shawna [name] was evaluated in Emergency Department on 02/28/2019 for the symptoms described in the history of present illness. She was evaluated in the context of the global COVID-19 pandemic, which necessitated consideration that the patient might be at risk for infection with the SARS-CoV-2 virus that causes COVID-19. Institutional protocols and algorithms that pertain to the evaluation of patients at risk for COVID-19 are in a state  of rapid change based on information released by regulatory bodies including the CDC and federal and state organizations. These policies and algorithms were followed during the patient's care in the ED.     Clinical Course as of Feb 27 1254  Tue Feb 28, 2019  1038 Patient presents with 3 days of upper abdominal pain, worse with eating.  Appears mildly dehydrated.  Will give Toradol, Zofran, IV fluids for symptomatic relief, check labs and ultrasound of the right upper quadrant.   [PS]    Clinical Course User Index [PS] Sharman Cheek, MD     -----------------------------------------  12:56 PM on 02/28/2019 -----------------------------------------  Labs are normal, ultrasound of the right upper quadrant is normal.  Patient given antacids are feeling better.  Oxygen saturation is normal, no respiratory symptoms or distress.  Stable for discharge home on GI regimen.  Doubt PE, ACS, dissection, AAA, mesenteric ischemia, pneumothorax, pneumonia ____________________________________________   FINAL CLINICAL IMPRESSION(S) / ED DIAGNOSES    Final diagnoses:  Acute gastritis without hemorrhage, unspecified gastritis type  Dehydration  Epigastric pain     ED Discharge Orders         Ordered    aluminum-magnesium hydroxide-simethicone (MAALOX) 200-200-20 MG/5ML SUSP  3 times daily before meals & bedtime     02/28/19 1254    famotidine (PEPCID) 20 MG tablet  2 times daily     02/28/19 1254    metoCLOPramide (REGLAN) 10 MG tablet  Every 6 hours PRN     02/28/19 1254          Portions of this note were generated with dragon dictation software. Dictation errors may occur despite best attempts at proofreading.   Sharman Cheek, MD 02/28/19 1257

## 2019-02-28 NOTE — ED Notes (Addendum)
O2 sats 90% on RA while laying flat during [place]. Upon this RN arrival to bedside, US done and pt requesting to ambulate to go to the bathroom. Pt remains on RA at this time.

## 2019-02-28 NOTE — ED Notes (Signed)
US at bedside

## 2019-03-04 ENCOUNTER — Encounter: Payer: Self-pay | Admitting: Emergency Medicine

## 2019-03-04 ENCOUNTER — Emergency Department: Payer: Medicaid Other

## 2019-03-04 ENCOUNTER — Emergency Department
Admission: EM | Admit: 2019-03-04 | Discharge: 2019-03-04 | Disposition: A | Discharge status: Home / Self Care | Payer: Medicaid Other | Attending: Emergency Medicine | Admitting: Emergency Medicine

## 2019-03-04 ENCOUNTER — Other Ambulatory Visit: Payer: Self-pay

## 2019-03-04 DIAGNOSIS — F17210 Nicotine dependence, cigarettes, uncomplicated: Secondary | ICD-10-CM | POA: Insufficient documentation

## 2019-03-04 DIAGNOSIS — J449 Chronic obstructive pulmonary disease, unspecified: Secondary | ICD-10-CM | POA: Insufficient documentation

## 2019-03-04 DIAGNOSIS — R1084 Generalized abdominal pain: Secondary | ICD-10-CM | POA: Insufficient documentation

## 2019-03-04 DIAGNOSIS — Z79899 Other long term (current) drug therapy: Secondary | ICD-10-CM | POA: Diagnosis not present

## 2019-03-04 DIAGNOSIS — R1110 Vomiting, unspecified: Secondary | ICD-10-CM | POA: Diagnosis present

## 2019-03-04 DIAGNOSIS — R112 Nausea with vomiting, unspecified: Secondary | ICD-10-CM | POA: Diagnosis not present

## 2019-03-04 LAB — CBC
HCT: 38.6 % (ref 36.0–46.0)
Hemoglobin: 13.5 g/dL (ref 12.0–15.0)
MCH: 29.7 pg (ref 26.0–34.0)
MCHC: 35 g/dL (ref 30.0–36.0)
MCV: 84.8 fL (ref 80.0–100.0)
Platelets: 361 10*3/uL (ref 150–400)
RBC: 4.55 MIL/uL (ref 3.87–5.11)
RDW: 14 % (ref 11.5–15.5)
WBC: 11.1 10*3/uL — ABNORMAL HIGH (ref 4.0–10.5)
nRBC: 0 % (ref 0.0–0.2)

## 2019-03-04 LAB — URINALYSIS, COMPLETE (UACMP) WITH MICROSCOPIC
Bacteria, UA: NONE SEEN
Bilirubin Urine: NEGATIVE
Glucose, UA: NEGATIVE mg/dL
Hgb urine dipstick: NEGATIVE
Ketones, ur: NEGATIVE mg/dL
Leukocytes,Ua: NEGATIVE
Nitrite: NEGATIVE
Protein, ur: 30 mg/dL — AB
Specific Gravity, Urine: 1.019 (ref 1.005–1.030)
pH: 6 (ref 5.0–8.0)

## 2019-03-04 LAB — COMPREHENSIVE METABOLIC PANEL
ALT: 11 U/L (ref 0–44)
AST: 17 U/L (ref 15–41)
Albumin: 3.7 g/dL (ref 3.5–5.0)
Alkaline Phosphatase: 87 U/L (ref 38–126)
Anion gap: 11 (ref 5–15)
BUN: 18 mg/dL (ref 6–20)
CO2: 21 mmol/L — ABNORMAL LOW (ref 22–32)
Calcium: 8.4 mg/dL — ABNORMAL LOW (ref 8.9–10.3)
Chloride: 107 mmol/L (ref 98–111)
Creatinine, Ser: 0.75 mg/dL (ref 0.44–1.00)
GFR calc Af Amer: >60 mL/min (ref >60)
GFR calc non Af Amer: >60 mL/min (ref >60)
Glucose, Bld: 123 mg/dL — ABNORMAL HIGH (ref 70–99)
Potassium: 3.7 mmol/L (ref 3.5–5.1)
Sodium: 139 mmol/L (ref 135–145)
Total Bilirubin: 0.7 mg/dL (ref 0.3–1.2)
Total Protein: 6.6 g/dL (ref 6.5–8.1)

## 2019-03-04 LAB — LIPASE, BLOOD: Lipase: 24 U/L (ref 11–51)

## 2019-03-04 MED ORDER — DICYCLOMINE HCL 20 MG PO TABS: 20.0000 mg | ORAL_TABLET | Freq: Three times a day (TID) | ORAL | 0 refills | Status: DC | PRN

## 2019-03-04 MED ORDER — IOHEXOL 9 MG/ML PO SOLN
500.0000 mL | Freq: Two times a day (BID) | ORAL | Status: DC | PRN
  Administered 2019-03-04: 500 mL via ORAL
  Filled 2019-03-04 (×2): qty 500

## 2019-03-04 MED ORDER — PREDNISONE 10 MG (21) PO TBPK: ORAL_TABLET | ORAL | 0 refills | Status: DC

## 2019-03-04 MED ORDER — ONDANSETRON HCL 4 MG/2ML IJ SOLN
4.0000 mg | Freq: Once | INTRAMUSCULAR | Status: AC
  Administered 2019-03-04: 4 mg via INTRAVENOUS
  Filled 2019-03-04: qty 2

## 2019-03-04 MED ORDER — KETOROLAC TROMETHAMINE 30 MG/ML IJ SOLN
15.0000 mg | Freq: Once | INTRAMUSCULAR | Status: AC
  Administered 2019-03-04: 15 mg via INTRAVENOUS
  Filled 2019-03-04: qty 1

## 2019-03-04 MED ORDER — DICYCLOMINE HCL 10 MG PO CAPS
10.0000 mg | ORAL_CAPSULE | Freq: Once | ORAL | Status: AC
  Administered 2019-03-04: 10 mg via ORAL
  Filled 2019-03-04: qty 1

## 2019-03-04 MED ORDER — SODIUM CHLORIDE 0.9 % IV BOLUS
1000.0000 mL | Freq: Once | INTRAVENOUS | Status: AC
  Administered 2019-03-04: 1000 mL via INTRAVENOUS

## 2019-03-04 MED ORDER — SODIUM CHLORIDE 0.9% FLUSH: 3.0000 mL | Freq: Once | INTRAVENOUS | Status: DC

## 2019-03-04 MED ORDER — IOHEXOL 300 MG/ML  SOLN
100.0000 mL | Freq: Once | INTRAMUSCULAR | Status: AC | PRN
  Administered 2019-03-04: 100 mL via INTRAVENOUS
  Filled 2019-03-04: qty 100

## 2019-03-04 NOTE — Discharge Instructions (Signed)
Call and schedule follow-up appointment with your primary care provider as well as your gastroenterologist.  Take the medications as prescribed.  Return to the emergency department for symptoms that change or worsen if you are unable to schedule an appointment.

## 2019-03-04 NOTE — ED Notes (Signed)
Green top recollected and sent to lab ?

## 2019-03-04 NOTE — ED Provider Notes (Signed)
[facility] Emergency Department Provider Note ____________________________________________   None    (approximate)  I have reviewed the triage vital signs and the nursing notes.   HISTORY  Chief Complaint Emesis and Abdominal Pain  HPI Shawna [name] is a 53 y.o. female presents to the emergency department for treatment and evaluation of abdominal pain with nausea and vomiting for the past week.  She was evaluated here 4 days ago for the same and has not improved with Reglan as prescribed.  She has also taken Zofran without any relief.  Symptoms have not changed, but she does not feel any better.    Past Medical History:  Diagnosis Date   Anxiety    Asthma    Bulging lumbar disc    Bulging lumbar disc    GERD (gastroesophageal reflux disease)    Insomnia    Kidney stone    Pneumonia DEC 2016   Sciatic leg pain    Right side   Sciatica of right side    Weakness    Bil knees, and ankles    Patient Active Problem List   Diagnosis Date Noted   Visual hallucinations 07/30/2017   Colitis 01/22/2017   Dyslipidemia 07/26/2016   Low vitamin B12 level 07/26/2016   Major depressive disorder, 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   Overdose of sedative or hypnotic 07/23/2016   COPD exacerbation (HCC) 07/23/2016   GERD (gastroesophageal reflux disease) 07/23/2016   Cannabis use disorder, moderate, 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   Tobacco use disorder 07/23/2016   Sedative, hypnotic or anxiolytic use disorder, severe, 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   Bacterial pneumonia 01/23/2015   Leukocytosis 01/23/2015   Sepsis (HCC) 01/23/2015   Hypoxemia 01/23/2015   Opioid use disorder, moderate, dependence (HCC) 12/31/2014   Anxiety disorder, unspecified 12/31/2014    Past Surgical History:  Procedure Laterality Date   ABDOMINAL HYSTERECTOMY     Partial   ESOPHAGOGASTRODUODENOSCOPY (EGD) WITH PROPOFOL N/A  11/27/2015   Procedure: ESOPHAGOGASTRODUODENOSCOPY (EGD) WITH PROPOFOL;  Surgeon: Christena Deem, MD;  Location: [facility] ENDOSCOPY;  Service: Endoscopy;  Laterality: N/A;   SHOULDER SURGERY Right    TONSILLECTOMY     TUBAL LIGATION      Prior to Admission medications   Medication Sig Start Date End Date Taking? Authorizing Provider  acetaminophen (TYLENOL) 500 MG tablet Take 500 mg by mouth every 6 (six) hours as needed.    [provider]  albuterol (PROVENTIL HFA;VENTOLIN HFA) 108 (90 Base) MCG/ACT inhaler Inhale 2 puffs into the lungs every 6 (six) hours as needed for wheezing or shortness of breath. 01/16/16   Jennye Moccasin, MD  aluminum-magnesium hydroxide-simethicone (MAALOX) 200-200-20 MG/5ML SUSP Take 30 mLs by mouth 4 (four) times daily -  before meals and at bedtime. 02/28/19   Sharman Cheek, MD  clonazePAM (KLONOPIN) 0.5 MG tablet Take 0.5 mg by mouth 2 (two) times daily as needed for anxiety.    [provider]  dexlansoprazole (DEXILANT) 60 MG capsule Take 1 capsule by mouth daily.    [provider]  dicyclomine (BENTYL) 20 MG tablet Take 1 tablet (20 mg total) by mouth 3 (three) times daily as needed for spasms. 03/04/19 03/03/20  Jameriah Trotti, Rulon Eisenmenger B, FNP  DULoxetine 40 MG CPEP Take 40 mg by mouth daily. 07/27/16   Pucilowska, Braulio Conte B, MD  famotidine (PEPCID) 20 MG tablet Take 1 tablet (20 mg total) by mouth 2 (two) times daily. 02/28/19   Sharman Cheek, MD  fexofenadine (  ALLEGRA) 180 MG tablet Take 180 mg by mouth daily.    [provider]  fluticasone (FLONASE) 50 MCG/ACT nasal spray Place 2 sprays into both nostrils daily.     [provider]  hydrOXYzine (ATARAX/VISTARIL) 50 MG tablet Take 50 mg by mouth every 6 (six) hours as needed.     [provider]  methylPREDNISolone (MEDROL DOSEPAK) 4 MG TBPK tablet Take Tapered dose as directed 11/16/18   Joni Reining, PA-C  metoCLOPramide (REGLAN) 10 MG tablet Take 1 tablet  (10 mg total) by mouth every 6 (six) hours as needed. 02/28/19   Sharman Cheek, MD  ondansetron (ZOFRAN-ODT) 4 MG disintegrating tablet Take 1 tablet (4 mg total) by mouth every 8 (eight) hours as needed for nausea or vomiting. 02/03/18   Cuthriell, Delorise Royals, PA-C  predniSONE (STERAPRED UNI-PAK 21 TAB) 10 MG (21) TBPK tablet Take 6 tablets on the first day and decrease by 1 tablet each day until finished. 03/04/19   Ily Denno, Rulon Eisenmenger B, FNP  traMADol (ULTRAM) 50 MG tablet Take 1 tablet (50 mg total) by mouth every 6 (six) hours as needed. 11/16/18 11/16/19  Joni Reining, PA-C  zolpidem (AMBIEN) 10 MG tablet Take 10 mg by mouth at bedtime as needed for sleep.    [provider]    Allergies Ciprofloxacin hcl, Fentanyl, Haldol [haloperidol], Morphine and related, Phenergan [promethazine hcl], Propofol, and Vicodin [hydrocodone-acetaminophen]  Family History  Problem Relation Age of Onset   Fibromyalgia Mother     Social History Social History   Tobacco Use   Smoking status: Current Every Day Smoker    Packs/day: 1.00    Years: 40.00    Pack years: 40.00    Types: Cigarettes   Smokeless tobacco: Current User  Substance Use Topics   Alcohol use: No   Drug use: No    Review of Systems  Constitutional: No fever/chills Eyes: No visual changes. ENT: No sore throat. Cardiovascular: Denies chest pain. Respiratory: Denies shortness of breath. Gastrointestinal: Positive for abdominal pain.  Positive for nausea and vomiting.  No diarrhea.  No constipation. Genitourinary: Negative for dysuria. Musculoskeletal: Negative for back pain. Skin: Negative for rash. Neurological: Negative for headaches, focal weakness or numbness. ____________________________________________   PHYSICAL EXAM:  VITAL SIGNS: ED Triage Vitals  Enc Vitals Group     BP 03/04/19 1243 115/67     Pulse Rate 03/04/19 1243 80     Resp 03/04/19 1243 18     Temp 03/04/19 1243 99.1 F (37.3 C)      Temp Source 03/04/19 1243 Oral     SpO2 03/04/19 1243 95 %     Weight 03/04/19 1244 160 lb (72.6 kg)     Height 03/04/19 1244  (1.676 m)     Head Circumference --      Peak Flow --      Pain Score 03/04/19 1244 8     Pain Loc --      Pain Edu? --      Excl. in GC? --     Constitutional: Alert and oriented. Well appearing and in no acute distress. Eyes: Conjunctivae are normal.  Head: Atraumatic. Nose: No congestion/rhinnorhea. Mouth/Throat: Mucous membranes are moist.  Oropharynx non-erythematous. Neck: No stridor.   Hematological/Lymphatic/Immunilogical: No cervical lymphadenopathy. Cardiovascular: Normal rate, regular rhythm. Grossly normal heart sounds.  Good peripheral circulation. Respiratory: Normal respiratory effort.  No retractions. Lungs CTAB. Gastrointestinal: Soft and diffusely tender. No distention. No abdominal bruits. No CVA tenderness.  Genitourinary:  Musculoskeletal: No lower extremity tenderness nor edema.  No joint effusions. Neurologic:  Normal speech and language. No gross focal neurologic deficits are appreciated. No gait instability. Skin:  Skin is warm, dry and intact. No rash noted. Psychiatric: Mood and affect are normal. Speech and behavior are normal.  ____________________________________________   LABS (all labs ordered are listed, but only abnormal results are displayed)  Labs Reviewed  CBC - Abnormal; Notable for the following components:      Result Value   WBC 11.1 (*)    All other components within normal limits  URINALYSIS, COMPLETE (UACMP) WITH MICROSCOPIC - Abnormal; Notable for the following components:   Color, Urine YELLOW (*)    APPearance HAZY (*)    Protein, ur 30 (*)    All other components within normal limits  COMPREHENSIVE METABOLIC PANEL - Abnormal; Notable for the following components:   CO2 21 (*)    Glucose, Bld 123 (*)    Calcium 8.4 (*)    All other components within normal limits  LIPASE, BLOOD    ____________________________________________  EKG  Not indicated. ____________________________________________  RADIOLOGY  ED MD interpretation:   There is mild mural fibrofatty stratification of the terminal  ileum and cecum (series 5, image 32, 26). Findings are consistent  with chronic stigmata of previous or ongoing inflammation without  evidence of acute inflammatory findings. This distribution in  particular can be seen in Crohn's disease.      Official radiology report(s): Ct Abdomen Pelvis W Contrast  Result Date: 03/04/2019 CLINICAL DATA:  Nausea, vomiting, abdominal pain, history of hysterectomy EXAM: CT ABDOMEN AND PELVIS WITH CONTRAST TECHNIQUE: Multidetector CT imaging of the abdomen and pelvis was performed using the standard protocol following bolus administration of intravenous contrast. CONTRAST:  100mL OMNIPAQUE IOHEXOL 300 MG/ML  SOLN COMPARISON:  09/01/2017 FINDINGS: Lower chest: Bandlike scarring of the bilateral lung bases unchanged compared to prior examination. Hepatobiliary: No solid liver abnormality is seen. No gallstones, gallbladder wall thickening, or biliary dilatation. Pancreas: Unremarkable. No pancreatic ductal dilatation or surrounding inflammatory changes. Spleen: Normal in size without significant abnormality. Adrenals/Urinary Tract: Adrenal glands are unremarkable. Nonobstructive renal calculi. No evidence of ureteral calculus or hydronephrosis. Bladder is unremarkable. Stomach/Bowel: Stomach is within normal limits. Very diminutive although normal retrocecal appendix. There is mild mural fibrofatty stratification of the terminal ileum and cecum (series 5, image 32, 26). Sigmoid diverticula. Vascular/Lymphatic: Aortic atherosclerosis. No enlarged abdominal or pelvic lymph nodes. Reproductive: Status post hysterectomy. Other: No abdominal wall hernia or abnormality. No abdominopelvic ascites. Musculoskeletal: Subchondral sclerosis of the right greater  than left femoral heads, with slight irregularity of the superior contour of the right femoral head. IMPRESSION: 1. No acute CT findings of the abdomen or pelvis to explain nausea, vomiting, or abdominal pain. 2. There is mild mural fibrofatty stratification of the terminal ileum and cecum (series 5, image 32, 26). Findings are consistent with chronic stigmata of previous or ongoing inflammation without evidence of acute inflammatory findings. This distribution in particular can be seen in Crohn's disease. 3.  Nonobstructive bilateral nephrolithiasis. 4. Subchondral sclerosis of the right greater than left femoral heads, with slight irregularity of the superior contour of the right femoral head. Findings are consistent with right greater than left avascular necrosis with evidence of early subchondral collapse of the right femoral head, and are notably somewhat worsened when compared to examination dated 08/30/2017. Electronically Signed   By: Lauralyn PrimesAlex  Bibbey M.D.   On: 03/04/2019 16:48  ____________________________________________   PROCEDURES  Procedure(s) performed (including Critical Care):  Procedures  ____________________________________________   INITIAL IMPRESSION / ASSESSMENT AND PLAN     53 year old female presenting for treatment and evaluation of abdominal pain.  She has been evaluated multiple times for similar symptoms.  4 days ago she had an ultrasound that was negative for any acute findings.  Today the plan will be to do a CT of the abdomen and pelvis with oral and IV contrast.  DIFFERENTIAL DIAGNOSIS  Gastritis, diverticulosis, diverticulitis, irritable bowel syndrome, viral gastroenteritis  ED COURSE  CT does not show any acute findings, however there is some suggestion that a diagnosis of Crohn's is possible.  While here, the patient did not have any vomiting episodes.  She was medicated with Zofran, Bentyl, and Toradol.  Symptoms are partially relieved.  Labs show a  very mild leukocytosis without a left shift, BMP is unremarkable.  Urinalysis shows some calcium oxalate crystals which corresponds to the CT findings of bilateral nephrolithiasis.  The CT also shows avascular necrosis which is a known diagnosis.  Patient will be discharged home with a prescription for Bentyl and prednisone.  She was given strict return return precautions as well as strongly encouraged to call and schedule a follow-up appointment with gastroenterology.  She states that [facility] and will call on Monday to request an appointment. ____________________________________________   FINAL CLINICAL IMPRESSION(S) / ED DIAGNOSES  Final diagnoses:  Generalized abdominal pain  Intractable vomiting with nausea, unspecified vomiting type     ED Discharge Orders         Ordered    dicyclomine (BENTYL) 20 MG tablet  3 times daily PRN,   Status:  Discontinued     03/04/19 1717    predniSONE (STERAPRED UNI-PAK 21 TAB) 10 MG (21) TBPK tablet  Status:  Discontinued     03/04/19 1718    dicyclomine (BENTYL) 20 MG tablet  3 times daily PRN     03/04/19 1815    predniSONE (STERAPRED UNI-PAK 21 TAB) 10 MG (21) TBPK tablet     03/04/19 1815           Note:  This document was prepared using Dragon voice recognition software and may include unintentional dictation errors.   Victorino Dike, FNP 03/04/19 1956    Carrie Mew, MD 03/09/19 [phone]

## 2019-03-04 NOTE — ED Triage Notes (Signed)
FIRST NURSE NOTE: Pt arrived via EMS from home with reports of abdominal pain and N/V x 1 week, pt seen here on 11/3, VSS per EMS. Pt has 22G IV in L hand and was given 4mg  IV zofran enroute.

## 2019-03-04 NOTE — ED Triage Notes (Signed)
Nausea, vomiting and abdominal pain x 2 weeks. States has been here for previous work ups without diagnosis.

## 2019-03-08 ENCOUNTER — Emergency Department: Payer: Medicaid Other

## 2019-03-08 ENCOUNTER — Emergency Department
Admission: EM | Admit: 2019-03-08 | Discharge: 2019-03-08 | Disposition: A | Discharge status: Home / Self Care | Payer: Medicaid Other | Attending: Emergency Medicine | Admitting: Emergency Medicine

## 2019-03-08 ENCOUNTER — Encounter: Payer: Self-pay | Admitting: Emergency Medicine

## 2019-03-08 ENCOUNTER — Other Ambulatory Visit: Payer: Self-pay

## 2019-03-08 DIAGNOSIS — R112 Nausea with vomiting, unspecified: Secondary | ICD-10-CM | POA: Insufficient documentation

## 2019-03-08 DIAGNOSIS — J45909 Unspecified asthma, uncomplicated: Secondary | ICD-10-CM | POA: Diagnosis not present

## 2019-03-08 DIAGNOSIS — R1084 Generalized abdominal pain: Secondary | ICD-10-CM | POA: Insufficient documentation

## 2019-03-08 DIAGNOSIS — J449 Chronic obstructive pulmonary disease, unspecified: Secondary | ICD-10-CM | POA: Insufficient documentation

## 2019-03-08 DIAGNOSIS — F17210 Nicotine dependence, cigarettes, uncomplicated: Secondary | ICD-10-CM | POA: Insufficient documentation

## 2019-03-08 DIAGNOSIS — R109 Unspecified abdominal pain: Secondary | ICD-10-CM

## 2019-03-08 DIAGNOSIS — E860 Dehydration: Secondary | ICD-10-CM | POA: Diagnosis not present

## 2019-03-08 LAB — URINALYSIS, COMPLETE (UACMP) WITH MICROSCOPIC
Bacteria, UA: NONE SEEN
Bilirubin Urine: NEGATIVE
Glucose, UA: NEGATIVE mg/dL
Hgb urine dipstick: NEGATIVE
Ketones, ur: NEGATIVE mg/dL
Leukocytes,Ua: NEGATIVE
Nitrite: NEGATIVE
Protein, ur: NEGATIVE mg/dL
Specific Gravity, Urine: >1.046 — ABNORMAL HIGH (ref 1.005–1.030)
pH: 6 (ref 5.0–8.0)

## 2019-03-08 LAB — COMPREHENSIVE METABOLIC PANEL
ALT: 13 U/L (ref 0–44)
AST: 19 U/L (ref 15–41)
Albumin: 4.3 g/dL (ref 3.5–5.0)
Alkaline Phosphatase: 89 U/L (ref 38–126)
Anion gap: 12 (ref 5–15)
BUN: 27 mg/dL — ABNORMAL HIGH (ref 6–20)
CO2: 22 mmol/L (ref 22–32)
Calcium: 9.2 mg/dL (ref 8.9–10.3)
Chloride: 105 mmol/L (ref 98–111)
Creatinine, Ser: 1.33 mg/dL — ABNORMAL HIGH (ref 0.44–1.00)
GFR calc Af Amer: 53 mL/min — ABNORMAL LOW (ref >60)
GFR calc non Af Amer: 46 mL/min — ABNORMAL LOW (ref >60)
Glucose, Bld: 195 mg/dL — ABNORMAL HIGH (ref 70–99)
Potassium: 3.6 mmol/L (ref 3.5–5.1)
Sodium: 139 mmol/L (ref 135–145)
Total Bilirubin: 0.7 mg/dL (ref 0.3–1.2)
Total Protein: 7.7 g/dL (ref 6.5–8.1)

## 2019-03-08 LAB — CBC
HCT: 42.9 % (ref 36.0–46.0)
Hemoglobin: 14.2 g/dL (ref 12.0–15.0)
MCH: 29.1 pg (ref 26.0–34.0)
MCHC: 33.1 g/dL (ref 30.0–36.0)
MCV: 87.9 fL (ref 80.0–100.0)
Platelets: 405 10*3/uL — ABNORMAL HIGH (ref 150–400)
RBC: 4.88 MIL/uL (ref 3.87–5.11)
RDW: 14.1 % (ref 11.5–15.5)
WBC: 13.5 10*3/uL — ABNORMAL HIGH (ref 4.0–10.5)
nRBC: 0 % (ref 0.0–0.2)

## 2019-03-08 LAB — LIPASE, BLOOD: Lipase: 23 U/L (ref 11–51)

## 2019-03-08 MED ORDER — IOHEXOL 9 MG/ML PO SOLN
1000.0000 mL | Freq: Once | ORAL | Status: DC | PRN
  Filled 2019-03-08: qty 1000

## 2019-03-08 MED ORDER — HYDROMORPHONE HCL 1 MG/ML IJ SOLN
0.5000 mg | Freq: Once | INTRAMUSCULAR | Status: AC
  Administered 2019-03-08: 0.5 mg via INTRAVENOUS
  Filled 2019-03-08: qty 1

## 2019-03-08 MED ORDER — TRAMADOL HCL 50 MG PO TABS: 50.0000 mg | ORAL_TABLET | Freq: Four times a day (QID) | ORAL | 0 refills | Status: AC | PRN

## 2019-03-08 MED ORDER — ONDANSETRON 8 MG PO TBDP: 8.0000 mg | ORAL_TABLET | Freq: Three times a day (TID) | ORAL | 0 refills | Status: DC | PRN

## 2019-03-08 MED ORDER — ONDANSETRON HCL 4 MG/2ML IJ SOLN
4.0000 mg | Freq: Once | INTRAMUSCULAR | Status: AC
  Administered 2019-03-08: 19:00:00 4 mg via INTRAVENOUS
  Filled 2019-03-08: qty 2

## 2019-03-08 MED ORDER — HYDROMORPHONE HCL 1 MG/ML IJ SOLN
0.5000 mg | Freq: Once | INTRAMUSCULAR | Status: AC
  Administered 2019-03-08: 15:00:00 0.5 mg via INTRAVENOUS
  Filled 2019-03-08: qty 1

## 2019-03-08 MED ORDER — SODIUM CHLORIDE 0.9 % IV BOLUS
1000.0000 mL | Freq: Once | INTRAVENOUS | Status: AC
  Administered 2019-03-08: 15:00:00 1000 mL via INTRAVENOUS

## 2019-03-08 MED ORDER — ONDANSETRON HCL 4 MG/2ML IJ SOLN
4.0000 mg | Freq: Once | INTRAMUSCULAR | Status: AC
  Administered 2019-03-08: 15:00:00 4 mg via INTRAVENOUS
  Filled 2019-03-08: qty 2

## 2019-03-08 MED ORDER — TRAMADOL HCL 50 MG PO TABS: 50.0000 mg | ORAL_TABLET | Freq: Four times a day (QID) | ORAL | 0 refills | Status: DC | PRN

## 2019-03-08 MED ORDER — IOHEXOL 300 MG/ML  SOLN
100.0000 mL | Freq: Once | INTRAMUSCULAR | Status: AC | PRN
  Administered 2019-03-08: 100 mL via INTRAVENOUS
  Filled 2019-03-08: qty 100

## 2019-03-08 MED ORDER — SODIUM CHLORIDE 0.9 % IV BOLUS
1000.0000 mL | Freq: Once | INTRAVENOUS | Status: AC
  Administered 2019-03-08: 16:00:00 1000 mL via INTRAVENOUS

## 2019-03-08 NOTE — ED Notes (Signed)
Up to bathroom to get urine specimen

## 2019-03-08 NOTE — ED Provider Notes (Signed)
[facility] Emergency Department Provider Note _______________[facility]_____________________________   First MD Initiated Contact with Patient 03/08/19 1333     (approximate)  I have reviewed the triage vital signs and the nursing notes.   HISTORY  Chief Complaint No chief complaint on file.    HPI Shawna [name] is a 53 y.o. female with PMH as noted below who presents with bilateral mid abdominal pain over the last few weeks, persistent course, associated with nausea and intermittent vomiting.  The patient reports decreased p.o. intake and states that over the last several days she has felt very weak, causing her to almost fall.  The patient states that the pain was worse today, as well as the weakness.  She denies any diarrhea or blood in the stool and states she had a normal bowel movement today.  She has not noted any fevers at home and has no urinary symptoms.  The patient states that she has an appointment for gastroenterology next week but states that the symptoms are getting worse and she could not wait.  She has been taking prednisone and Bentyl prescribed at her last ED visit 4 days ago.  Past Medical History:  Diagnosis Date  . Anxiety   . Asthma   . Bulging lumbar disc   . Bulging lumbar disc   . GERD (gastroesophageal reflux disease)   . Insomnia   . Kidney stone   . Pneumonia DEC 2016  . Sciatic leg pain    Right side  . Sciatica of right side   . Weakness    Bil knees, and ankles    Patient Active Problem List   Diagnosis Date Noted  . Visual hallucinations 07/30/2017  . Colitis 01/22/2017  . Dyslipidemia 07/26/2016  . Low vitamin B12 level 07/26/2016  . Major depressive disorder, 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  . Overdose of sedative or hypnotic 07/23/2016  . COPD exacerbation (HCC) 07/23/2016  . GERD (gastroesophageal reflux disease) 07/23/2016  . Cannabis use disorder, moderate, 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   . Tobacco use disorder 07/23/2016  . Sedative, hypnotic or anxiolytic use disorder, severe, 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  . Bacterial pneumonia 01/23/2015  . Leukocytosis 01/23/2015  . Sepsis (HCC) 01/23/2015  . Hypoxemia 01/23/2015  . Opioid use disorder, moderate, dependence (HCC) 12/31/2014  . Anxiety disorder, unspecified 12/31/2014    Past Surgical History:  Procedure Laterality Date  . ABDOMINAL HYSTERECTOMY     Partial  . ESOPHAGOGASTRODUODENOSCOPY (EGD) WITH PROPOFOL N/A 11/27/2015   Procedure: ESOPHAGOGASTRODUODENOSCOPY (EGD) WITH PROPOFOL;  Surgeon: Christena DeemMartin U Skulskie, MD;  [facility];  Service: Endoscopy;  Laterality: N/A;  . SHOULDER SURGERY Right   . TONSILLECTOMY    . TUBAL LIGATION      Prior to Admission medications   Medication Sig Start Date End Date Taking? Authorizing Provider  acetaminophen (TYLENOL) 500 MG tablet Take 500 mg by mouth every 6 (six) hours as needed.    [provider]  albuterol (PROVENTIL HFA;VENTOLIN HFA) 108 (90 Base) MCG/ACT inhaler Inhale 2 puffs into the lungs every 6 (six) hours as needed for wheezing or shortness of breath. 01/16/16   Jennye MoccasinQuigley, Brian S, MD  aluminum-magnesium hydroxide-simethicone (MAALOX) 200-200-20 MG/5ML SUSP Take 30 mLs by mouth 4 (four) times daily -  before meals and at bedtime. 02/28/19   Sharman CheekStafford, Phillip, MD  clonazePAM (KLONOPIN) 0.5 MG tablet Take 0.5 mg by mouth 2 (two) times daily as needed for anxiety.    [provider]  dexlansoprazole (DEXILANT) 60 MG capsule Take 1 capsule by mouth daily.    [provider]  dicyclomine (BENTYL) 20 MG tablet Take 1 tablet (20 mg total) by mouth 3 (three) times daily as needed for spasms. 03/04/19 03/03/20  Triplett, Johnette Abraham B, FNP  DULoxetine 40 MG CPEP Take 40 mg by mouth daily. 07/27/16   Pucilowska, Herma Ard B, MD  famotidine (PEPCID) 20 MG tablet Take 1 tablet (20 mg total) by mouth 2 (two) times daily. 02/28/19   Carrie Mew, MD   fexofenadine (ALLEGRA) 180 MG tablet Take 180 mg by mouth daily.    [provider]  fluticasone (FLONASE) 50 MCG/ACT nasal spray Place 2 sprays into both nostrils daily.     [provider]  hydrOXYzine (ATARAX/VISTARIL) 50 MG tablet Take 50 mg by mouth every 6 (six) hours as needed.     [provider]  methylPREDNISolone (MEDROL DOSEPAK) 4 MG TBPK tablet Take Tapered dose as directed 11/16/18   Sable Feil, PA-C  metoCLOPramide (REGLAN) 10 MG tablet Take 1 tablet (10 mg total) by mouth every 6 (six) hours as needed. 02/28/19   Carrie Mew, MD  ondansetron (ZOFRAN ODT) 8 MG disintegrating tablet Take 1 tablet (8 mg total) by mouth every 8 (eight) hours as needed for nausea or vomiting. 03/08/19   Arta Silence, MD  ondansetron (ZOFRAN-ODT) 4 MG disintegrating tablet Take 1 tablet (4 mg total) by mouth every 8 (eight) hours as needed for nausea or vomiting. 02/03/18   Cuthriell, Charline Bills, PA-C  predniSONE (STERAPRED UNI-PAK 21 TAB) 10 MG (21) TBPK tablet Take 6 tablets on the first day and decrease by 1 tablet each day until finished. 03/04/19   Triplett, Johnette Abraham B, FNP  traMADol (ULTRAM) 50 MG tablet Take 1 tablet (50 mg total) by mouth every 6 (six) hours as needed. 11/16/18 11/16/19  Sable Feil, PA-C  traMADol (ULTRAM) 50 MG tablet Take 1 tablet (50 mg total) by mouth every 6 (six) hours as needed for up to 7 days for severe pain. 03/08/19 03/15/19  Arta Silence, MD  zolpidem (AMBIEN) 10 MG tablet Take 10 mg by mouth at bedtime as needed for sleep.    [provider]    Allergies Ciprofloxacin hcl, Fentanyl, Haldol [haloperidol], Morphine and related, Phenergan [promethazine hcl], Propofol, and Vicodin [hydrocodone-acetaminophen]  Family History  Problem Relation Age of Onset  . Fibromyalgia Mother     Social History Social History   Tobacco Use  . Smoking status: Current Every Day Smoker    Packs/day: 1.00    Years: 40.00     Pack years: 40.00    Types: Cigarettes  . Smokeless tobacco: Current User  Substance Use Topics  . Alcohol use: No  . Drug use: No    Review of Systems  Constitutional: No fever/chills. Eyes: No redness. ENT: No sore throat. Cardiovascular: Denies chest pain. Respiratory: Denies shortness of breath. Gastrointestinal: Positive for nausea and vomiting. Genitourinary: Negative for dysuria.  Musculoskeletal: Negative for back pain. Skin: Negative for rash. Neurological: Negative for headache.   ____________________________________________   PHYSICAL EXAM:  VITAL SIGNS: ED Triage Vitals  Enc Vitals Group     BP 03/08/19 1208 (!) 124/91     Pulse Rate 03/08/19 1208 (!) 102     Resp 03/08/19 1208 16     Temp 03/08/19 1209 99.6 F (37.6 C)     Temp Source 03/08/19 1209 Oral     SpO2 03/08/19 1208 96 %  Weight --      Height --      Head Circumference --      Peak Flow --      Pain Score 03/08/19 1207 8     Pain Loc --      Pain Edu? --      Excl. in GC? --     Constitutional: Alert and oriented.  Uncomfortable appearing but in no acute distress. Eyes: Conjunctivae are normal.  No scleral icterus. Head: Atraumatic. Nose: No congestion/rhinnorhea. Mouth/Throat: Mucous membranes are slightly dry.   Neck: Normal range of motion.  Cardiovascular:  Good peripheral circulation. Respiratory: Normal respiratory effort.  No retractions.  Gastrointestinal: Soft with mild bilateral mid abdominal tenderness.  No distention.  Genitourinary: No flank tenderness. Musculoskeletal: Extremities warm and well perfused.  Neurologic:  Normal speech and language. No gross focal neurologic deficits are appreciated.  Skin:  Skin is warm and dry. No rash noted. Psychiatric: Mood and affect are normal. Speech and behavior are normal.  ____________________________________________   LABS (all labs ordered are listed, but only abnormal results are displayed)  Labs Reviewed   COMPREHENSIVE METABOLIC PANEL - Abnormal; Notable for the following components:      Result Value   Glucose, Bld 195 (*)    BUN 27 (*)    Creatinine, Ser 1.33 (*)    GFR calc non Af Amer 46 (*)    GFR calc Af Amer 53 (*)    All other components within normal limits  CBC - Abnormal; Notable for the following components:   WBC 13.5 (*)    Platelets 405 (*)    All other components within normal limits  URINALYSIS, COMPLETE (UACMP) WITH MICROSCOPIC - Abnormal; Notable for the following components:   Color, Urine STRAW (*)    APPearance CLEAR (*)    Specific Gravity, Urine >1.046 (*)    All other components within normal limits  LIPASE, BLOOD   ____________________________________________  EKG   ____________________________________________  RADIOLOGY  CT abdomen: No acute abnormality  ____________________________________________   PROCEDURES  Procedure(s) performed: No  Procedures  Critical Care performed: No ____________________________________________   INITIAL IMPRESSION / ASSESSMENT AND PLAN / ED COURSE  Pertinent labs & imaging results that were available during my care of the patient were reviewed by me and considered in my medical decision making (see chart for details).  53 year old female with PMH as noted above presents with bilateral mid and upper abdominal pain over the last several weeks associated with worsening nausea and vomiting, decreased p.o. intake, and generalized weakness.  The patient's symptoms have been unrelieved by Bentyl and prednisone prescribed on her last ED visit.  I reviewed the past medical records in Epic.  The patient was initially seen for upper abdominal pain 8 days ago and had a negative right upper quadrant ultrasound, reassuring labs, and improved with symptomatic treatment.  She was subsequently seen 4 days ago with similar symptoms and had a CT which was negative for acute findings, but showed fibrofatty stratification of the  ileum and cecum consistent with possible Crohn's disease.  On exam, the patient is uncomfortable but not acutely ill-appearing.  She has a low-grade temperature and borderline tachycardia with otherwise normal vital signs.  She does have some abdominal tenderness bilaterally in the mid abdomen.  The remainder of the exam is unremarkable.  Given the negative work-ups and imaging over the last 2 weeks, the etiology of the patient's symptoms is unclear.  A new diagnosis of Crohn's  is still possible, versus gastritis or other etiology not typically diagnosed or visualized by CT, versus possible enteritis or colitis that was too early to be seen on the prior imaging.  There is no evidence of vascular etiology.  Initial labs reveal that the patient's creatinine is worsening, her WBC count is elevated when compared to prior, and given this plus the low-grade temperature I am concerned for more acute etiology so based on discussion with the patient we will obtain a repeat CT.  ----------------------------------------- 6:43 PM on 03/08/2019 -----------------------------------------  CT today shows no acute findings.  Urinalysis is also negative.  On reassessment, the patient reports improvement in her symptoms.  She appears comfortable.  She has not had any vomiting in the ED.  I counseled her on the results of the work-up.  At this time, given that she is still able to tolerate some p.o. and has no active vomiting, as well as with a reassuring work-up, there is no indication for admission.  I will prescribe her Zofran which seemed to work well for her after she was here last month.  She is already on Dexilant.  I will have her finish out the course of prednisone that she is already on, but there is no indication to continue it further.  I have also ordered tramadol in case the patient has a wave of slightly more severe pain.  I emphasized the importance of maintaining her follow-up with GI.  I gave the  patient very thorough return precautions and she expressed understanding.  She is stable for discharge at this time.  ____________________________________________   FINAL CLINICAL IMPRESSION(S) / ED DIAGNOSES  Final diagnoses:  Abdominal pain, unspecified abdominal location  Dehydration      NEW MEDICATIONS STARTED DURING THIS VISIT:  New Prescriptions   ONDANSETRON (ZOFRAN ODT) 8 MG DISINTEGRATING TABLET    Take 1 tablet (8 mg total) by mouth every 8 (eight) hours as needed for nausea or vomiting.   TRAMADOL (ULTRAM) 50 MG TABLET    Take 1 tablet (50 mg total) by mouth every 6 (six) hours as needed for up to 7 days for severe pain.     Note:  This document was prepared using Dragon voice recognition software and may include unintentional dictation errors.    Dionne Bucy, MD 03/08/19 1845

## 2019-03-08 NOTE — ED Notes (Signed)
Says she is starting to hurt in abdomen 7.5/10 again.

## 2019-03-08 NOTE — Discharge Instructions (Signed)
Follow-up with the gastroenterologist as scheduled.  You should finish the course of prednisone that you are still taking.  Continue taking the Dexilant and your other normal medications.  You may use the Zofran prescribed today as needed for nausea.  You can take the tramadol prescribed today as needed if you have pain.  Drink small sips of water, Pedialyte, or diluted Gatorade frequently throughout the day to keep hydrated.  Gradually advance your diet, eating only light foods at first.  Return to the ER immediately for new, worsening, or persistent severe abdominal pain, vomiting, inability to hold anything down, persistent or worsening weakness, or any other new or worsening symptoms that concern you.

## 2019-03-08 NOTE — ED Triage Notes (Signed)
Pt c/o abd pain, states she has been seen here last week with no relief. PT states she has appt with GI but can't see them until next week and can't take pain. States n/v/d . NAD , VSS

## 2019-03-08 NOTE — ED Notes (Signed)
Patient ambulatory to lobby with steady gait and NAD noted. Verbalized understanding of discharge instructions and follow-up care.  

## 2019-03-11 ENCOUNTER — Other Ambulatory Visit: Payer: Self-pay

## 2019-03-11 ENCOUNTER — Emergency Department
Admission: EM | Admit: 2019-03-11 | Discharge: 2019-03-11 | Disposition: A | Discharge status: Home / Self Care | Payer: Medicaid Other | Attending: Emergency Medicine | Admitting: Emergency Medicine

## 2019-03-11 ENCOUNTER — Encounter: Payer: Self-pay | Admitting: Emergency Medicine

## 2019-03-11 DIAGNOSIS — R1084 Generalized abdominal pain: Secondary | ICD-10-CM

## 2019-03-11 DIAGNOSIS — R112 Nausea with vomiting, unspecified: Secondary | ICD-10-CM | POA: Insufficient documentation

## 2019-03-11 DIAGNOSIS — J449 Chronic obstructive pulmonary disease, unspecified: Secondary | ICD-10-CM | POA: Insufficient documentation

## 2019-03-11 DIAGNOSIS — F17210 Nicotine dependence, cigarettes, uncomplicated: Secondary | ICD-10-CM | POA: Insufficient documentation

## 2019-03-11 DIAGNOSIS — Z79899 Other long term (current) drug therapy: Secondary | ICD-10-CM | POA: Diagnosis not present

## 2019-03-11 LAB — URINE DRUG SCREEN, QUALITATIVE (ARMC ONLY)
Amphetamines, Ur Screen: NOT DETECTED
Barbiturates, Ur Screen: NOT DETECTED
Benzodiazepine, Ur Scrn: POSITIVE — AB
Cannabinoid 50 Ng, Ur ~~LOC~~: POSITIVE — AB
Cocaine Metabolite,Ur ~~LOC~~: NOT DETECTED
MDMA (Ecstasy)Ur Screen: NOT DETECTED
Methadone Scn, Ur: NOT DETECTED
Opiate, Ur Screen: NOT DETECTED
Phencyclidine (PCP) Ur S: NOT DETECTED
Tricyclic, Ur Screen: NOT DETECTED

## 2019-03-11 LAB — URINALYSIS, COMPLETE (UACMP) WITH MICROSCOPIC
Bilirubin Urine: NEGATIVE
Glucose, UA: NEGATIVE mg/dL
Hgb urine dipstick: NEGATIVE
Ketones, ur: NEGATIVE mg/dL
Leukocytes,Ua: NEGATIVE
Nitrite: NEGATIVE
Protein, ur: 30 mg/dL — AB
Specific Gravity, Urine: 1.015 (ref 1.005–1.030)
pH: 6 (ref 5.0–8.0)

## 2019-03-11 LAB — COMPREHENSIVE METABOLIC PANEL
ALT: 11 U/L (ref 0–44)
AST: 18 U/L (ref 15–41)
Albumin: 4.2 g/dL (ref 3.5–5.0)
Alkaline Phosphatase: 99 U/L (ref 38–126)
Anion gap: 13 (ref 5–15)
BUN: 15 mg/dL (ref 6–20)
CO2: 22 mmol/L (ref 22–32)
Calcium: 9 mg/dL (ref 8.9–10.3)
Chloride: 104 mmol/L (ref 98–111)
Creatinine, Ser: 0.9 mg/dL (ref 0.44–1.00)
GFR calc Af Amer: >60 mL/min (ref >60)
GFR calc non Af Amer: >60 mL/min (ref >60)
Glucose, Bld: 156 mg/dL — ABNORMAL HIGH (ref 70–99)
Potassium: 3.3 mmol/L — ABNORMAL LOW (ref 3.5–5.1)
Sodium: 139 mmol/L (ref 135–145)
Total Bilirubin: 0.8 mg/dL (ref 0.3–1.2)
Total Protein: 7 g/dL (ref 6.5–8.1)

## 2019-03-11 LAB — CBC
HCT: 42.2 % (ref 36.0–46.0)
Hemoglobin: 14.7 g/dL (ref 12.0–15.0)
MCH: 29.4 pg (ref 26.0–34.0)
MCHC: 34.8 g/dL (ref 30.0–36.0)
MCV: 84.4 fL (ref 80.0–100.0)
Platelets: 362 10*3/uL (ref 150–400)
RBC: 5 MIL/uL (ref 3.87–5.11)
RDW: 13.9 % (ref 11.5–15.5)
WBC: 11.3 10*3/uL — ABNORMAL HIGH (ref 4.0–10.5)
nRBC: 0 % (ref 0.0–0.2)

## 2019-03-11 LAB — LIPASE, BLOOD: Lipase: 18 U/L (ref 11–51)

## 2019-03-11 MED ORDER — HYDROMORPHONE HCL 1 MG/ML IJ SOLN
0.5000 mg | Freq: Once | INTRAMUSCULAR | Status: AC
  Administered 2019-03-11: 0.5 mg via INTRAVENOUS
  Filled 2019-03-11: qty 1

## 2019-03-11 MED ORDER — ONDANSETRON HCL 4 MG/2ML IJ SOLN
4.0000 mg | Freq: Once | INTRAMUSCULAR | Status: AC
  Administered 2019-03-11: 4 mg via INTRAVENOUS
  Filled 2019-03-11: qty 2

## 2019-03-11 MED ORDER — HYDROMORPHONE HCL 1 MG/ML IJ SOLN
INTRAMUSCULAR | Status: AC
  Filled 2019-03-11: qty 1

## 2019-03-11 MED ORDER — SODIUM CHLORIDE 0.9 % IV BOLUS
1000.0000 mL | Freq: Once | INTRAVENOUS | Status: AC
  Administered 2019-03-11: 1000 mL via INTRAVENOUS

## 2019-03-11 MED ORDER — ONDANSETRON 4 MG PO TBDP: 4.0000 mg | ORAL_TABLET | Freq: Three times a day (TID) | ORAL | 0 refills | Status: DC | PRN

## 2019-03-11 MED ORDER — OXYCODONE-ACETAMINOPHEN 5-325 MG PO TABS: 1.0000 | ORAL_TABLET | ORAL | 0 refills | Status: DC | PRN

## 2019-03-11 MED ORDER — HYDROMORPHONE HCL 1 MG/ML IJ SOLN
0.5000 mg | Freq: Once | INTRAMUSCULAR | Status: AC
  Administered 2019-03-11: 19:00:00 0.5 mg via INTRAVENOUS

## 2019-03-11 NOTE — ED Triage Notes (Signed)
Patient presents to the ED via EMS from home with abdominal pain and vomiting x 3 weeks.  Patient states her appointment with GI is the 19th but states she has not had any relief.  Patient reports vomiting x 5 in the past 24 hours.  Denies diarrhea.

## 2019-03-11 NOTE — ED Provider Notes (Signed)
[facility] Emergency Department Provider Note   ____________________________________________   First MD Initiated Contact with Patient 03/11/19 1658     (approximate)  I have reviewed the triage vital signs and the nursing notes.   HISTORY  Chief Complaint Abdominal Pain    HPI Shawna [name] is a 53 y.o. female who complains of intractable nausea and vomiting for 3 weeks.  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  She complains of crampy pain especially in the upper abdomen under the ribs.  She says is quite bad and she said Dilaudid last time made her feel better.  I will not CT her again as her symptoms really have not changed.  She does have a follow-up appointment with GI on the 19th.        Past Medical History:  Diagnosis Date  . Anxiety   . Asthma   . Bulging lumbar disc   . Bulging lumbar disc   . GERD (gastroesophageal reflux disease)   . Insomnia   . Kidney stone   . Pneumonia DEC 2016  . Sciatic leg pain    Right side  . Sciatica of right side   . Weakness    Bil knees, and ankles    Patient Active Problem List   Diagnosis Date Noted  . Visual hallucinations 07/30/2017  . Colitis 01/22/2017  . Dyslipidemia 07/26/2016  . Low vitamin B12 level 07/26/2016  . Major depressive disorder, 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  . Overdose of sedative or hypnotic 07/23/2016  . COPD exacerbation (HCC) 07/23/2016  . GERD (gastroesophageal reflux disease) 07/23/2016  . Cannabis use disorder, moderate, 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  . Tobacco use disorder 07/23/2016  . Sedative, hypnotic or anxiolytic use disorder, severe, 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  . Bacterial pneumonia 01/23/2015  . Leukocytosis 01/23/2015  . Sepsis (HCC) 01/23/2015  . Hypoxemia 01/23/2015  . Opioid use disorder, moderate, dependence (HCC) 12/31/2014  .  Anxiety disorder, unspecified 12/31/2014    Past Surgical History:  Procedure Laterality Date  . ABDOMINAL HYSTERECTOMY     Partial  . ESOPHAGOGASTRODUODENOSCOPY (EGD) WITH PROPOFOL N/A 11/27/2015   Procedure: ESOPHAGOGASTRODUODENOSCOPY (EGD) WITH PROPOFOL;  Surgeon: Christena Deem, MD;  Location: [facility] ENDOSCOPY;  Service: Endoscopy;  Laterality: N/A;  . SHOULDER SURGERY Right   . TONSILLECTOMY    . TUBAL LIGATION      Prior to Admission medications   Medication Sig Start Date End Date Taking? Authorizing Provider  acetaminophen (TYLENOL) 500 MG tablet Take 500 mg by mouth every 6 (six) hours as needed.    [provider]  albuterol (PROVENTIL HFA;VENTOLIN HFA) 108 (90 Base) MCG/ACT inhaler Inhale 2 puffs into the lungs every 6 (six) hours as needed for wheezing or shortness of breath. 01/16/16   Jennye Moccasin, MD  aluminum-magnesium hydroxide-simethicone (MAALOX) 200-200-20 MG/5ML SUSP Take 30 mLs by mouth 4 (four) times daily -  before meals and at bedtime. 02/28/19   Sharman Cheek, MD  clonazePAM (KLONOPIN) 0.5 MG tablet Take 0.5 mg by mouth 2 (two) times daily as needed for anxiety.    [provider]  dexlansoprazole (DEXILANT) 60 MG capsule Take 1 capsule by mouth daily.    [provider]  dicyclomine (BENTYL) 20 MG tablet Take 1 tablet (20 mg total) by mouth 3 (three) times daily as needed for spasms. 03/04/19 03/03/20  Triplett, Kasandra Knudsen,  FNP  DULoxetine 40 MG CPEP Take 40 mg by mouth daily. 07/27/16   Pucilowska, Herma Ard B, MD  famotidine (PEPCID) 20 MG tablet Take 1 tablet (20 mg total) by mouth 2 (two) times daily. 02/28/19   Carrie Mew, MD  fexofenadine (ALLEGRA) 180 MG tablet Take 180 mg by mouth daily.    [provider]  fluticasone (FLONASE) 50 MCG/ACT nasal spray Place 2 sprays into both nostrils daily.     [provider]  hydrOXYzine (ATARAX/VISTARIL) 50 MG tablet Take 50 mg by mouth every 6 (six) hours as needed.      [provider]  methylPREDNISolone (MEDROL DOSEPAK) 4 MG TBPK tablet Take Tapered dose as directed 11/16/18   Sable Feil, PA-C  metoCLOPramide (REGLAN) 10 MG tablet Take 1 tablet (10 mg total) by mouth every 6 (six) hours as needed. 02/28/19   Carrie Mew, MD  ondansetron (ZOFRAN ODT) 4 MG disintegrating tablet Take 1 tablet (4 mg total) by mouth every 8 (eight) hours as needed for nausea or vomiting. 03/11/19   Nena Polio, MD  ondansetron (ZOFRAN ODT) 8 MG disintegrating tablet Take 1 tablet (8 mg total) by mouth every 8 (eight) hours as needed for nausea or vomiting. 03/08/19   Arta Silence, MD  ondansetron (ZOFRAN-ODT) 4 MG disintegrating tablet Take 1 tablet (4 mg total) by mouth every 8 (eight) hours as needed for nausea or vomiting. 02/03/18   Cuthriell, Charline Bills, PA-C  oxyCODONE-acetaminophen (PERCOCET) 5-325 MG tablet Take 1 tablet by mouth every 4 (four) hours as needed for severe pain. 03/11/19 03/10/20  Nena Polio, MD  predniSONE (STERAPRED UNI-PAK 21 TAB) 10 MG (21) TBPK tablet Take 6 tablets on the first day and decrease by 1 tablet each day until finished. 03/04/19   Triplett, Johnette Abraham B, FNP  traMADol (ULTRAM) 50 MG tablet Take 1 tablet (50 mg total) by mouth every 6 (six) hours as needed. 11/16/18 11/16/19  Sable Feil, PA-C  traMADol (ULTRAM) 50 MG tablet Take 1 tablet (50 mg total) by mouth every 6 (six) hours as needed for up to 7 days for severe pain. 03/08/19 03/15/19  Arta Silence, MD  zolpidem (AMBIEN) 10 MG tablet Take 10 mg by mouth at bedtime as needed for sleep.    [provider]    Allergies Ciprofloxacin hcl, Fentanyl, Haldol [haloperidol], Morphine and related, Phenergan [promethazine hcl], Propofol, and Vicodin [hydrocodone-acetaminophen]  Family History  Problem Relation Age of Onset  . Fibromyalgia Mother     Social History Social History   Tobacco Use  . Smoking status: Current Every Day Smoker     Packs/day: 1.00    Years: 40.00    Pack years: 40.00    Types: Cigarettes  . Smokeless tobacco: Current User  Substance Use Topics  . Alcohol use: No  . Drug use: No    Review of Systems  Constitutional: No fever/chills Eyes: No visual changes. ENT: No sore throat. Cardiovascular: Denies chest pain. Respiratory: Denies shortness of breath. Gastrointestinal: Crampy abdominal pain.   nausea,  vomiting.  No diarrhea.  No constipation. Genitourinary: Negative for dysuria. Musculoskeletal: Negative for back pain. Skin: Negative for rash. Neurological: Negative for headaches, focal weakness   ____________________________________________   PHYSICAL EXAM:  VITAL SIGNS: ED Triage Vitals  Enc Vitals Group     BP 03/11/19 1310 125/87     Pulse Rate 03/11/19 1310 (!) 103     Resp 03/11/19 1310 16     Temp 03/11/19 1310  99 F (37.2 C)     Temp Source 03/11/19 1310 Oral     SpO2 --      Weight 03/11/19 1311 160 lb (72.6 kg)     Height 03/11/19 1311 5\' 6"  (1.676 m)     Head Circumference --      Peak Flow --      Pain Score 03/11/19 1310 8     Pain Loc --      Pain Edu? --      Excl. in GC? --     Constitutional: Alert and oriented. Well appearing and in no acute distress. Eyes: Conjunctivae are normal.  Head: Atraumatic. Nose: No congestion/rhinnorhea. Mouth/Throat: Mucous membranes are moist.  Oropharynx non-erythematous. Neck: No stridor.   Cardiovascular: Normal rate, regular rhythm. Grossly normal heart sounds.  Good peripheral circulation. Respiratory: Normal respiratory effort.  No retractions. Lungs CTAB. Gastrointestinal: Soft patient complains of pain on palpation of the upper abdomen appears to be very mild.. No distention. No abdominal bruits. No CVA tenderness. Musculoskeletal: No lower extremity tenderness nor edema.  No joint effusions. Neurologic:  Normal speech and language. No gross focal neurologic deficits are appreciated.  Skin:  Skin is warm, dry and  intact. No rash noted.   ____________________________________________   LABS (all labs ordered are listed, but only abnormal results are displayed)  Labs Reviewed  COMPREHENSIVE METABOLIC PANEL - Abnormal; Notable for the following components:      Result Value   Potassium 3.3 (*)    Glucose, Bld 156 (*)    All other components within normal limits  CBC - Abnormal; Notable for the following components:   WBC 11.3 (*)    All other components within normal limits  URINALYSIS, COMPLETE (UACMP) WITH MICROSCOPIC - Abnormal; Notable for the following components:   Color, Urine YELLOW (*)    APPearance CLEAR (*)    Protein, ur 30 (*)    Bacteria, UA RARE (*)    All other components within normal limits  URINE DRUG SCREEN, QUALITATIVE ([facility] ONLY) - Abnormal; Notable for the following components:   Cannabinoid 50 Ng, Ur Alpha POSITIVE (*)    Benzodiazepine, Ur Scrn POSITIVE (*)    All other components within normal limits  LIPASE, BLOOD   ____________________________________________  EKG   ____________________________________________  RADIOLOGY  ED MD interpretation:  Official radiology report(s): No results found.  ____________________________________________   PROCEDURES  Procedure(s) performed (including Critical Care):  Procedures   ____________________________________________   INITIAL IMPRESSION / ASSESSMENT AND PLAN / ED COURSE Joesph FillersWendy A Hawthorne was evaluated in Emergency Department on 03/11/2019 for the symptoms described in the history of present illness. She was evaluated in the context of the global COVID-19 pandemic, which necessitated consideration that the patient might be at risk for infection with the SARS-CoV-2 virus that causes COVID-19. Institutional protocols and algorithms that pertain to the evaluation of patients at risk for COVID-19 are in a state of rapid change based on information released by regulatory bodies including the CDC and federal and state  organizations. These policies and algorithms were followed during the patient's care in the ED.  Patient feeling better wants to go home we will let her go home.  Her tox screen is positive for marijuana I will warn her about that.  I will give her some Zofran and just a little bit of pain medication if she needs it.             ____________________________________________   FINAL CLINICAL IMPRESSION(S) /  ED DIAGNOSES  Final diagnoses:  Generalized abdominal pain  Non-intractable vomiting with nausea, unspecified vomiting type     ED Discharge Orders         Ordered    oxyCODONE-acetaminophen (PERCOCET) 5-325 MG tablet  Every 4 hours PRN     03/11/19 2046    ondansetron (ZOFRAN ODT) 4 MG disintegrating tablet  Every 8 hours PRN     03/11/19 2046           Note:  This document was prepared using Dragon voice recognition software and may include unintentional dictation errors.    Arnaldo Natal, MD 03/11/19 [id number]

## 2019-03-11 NOTE — ED Notes (Signed)
Patient resting quietly in room with no apparent acute distress at this time.  She states she is beginning to feel nauseous again.  Pt's sister is bedside.  Will continue to monitor.

## 2019-03-11 NOTE — Discharge Instructions (Signed)
Use the Zofran melt on your tongue tablet 1 3 times a day as needed for nausea and vomiting.  Use the Percocet if needed for severe pain.  Up to 4 times a day.  Follow-up with the GI doctor.  Return here if worse.  Do not use any marijuana.  This can make your nausea and vomiting much much worse.

## 2019-03-11 NOTE — ED Notes (Signed)
Patient resting quietly in room with no apparent acute distress at this time.  Will continue to monitor.

## 2019-03-16 ENCOUNTER — Other Ambulatory Visit: Payer: Self-pay | Admitting: Gastroenterology

## 2019-03-16 DIAGNOSIS — R1012 Left upper quadrant pain: Secondary | ICD-10-CM

## 2019-03-16 DIAGNOSIS — R1110 Vomiting, unspecified: Secondary | ICD-10-CM

## 2019-03-16 DIAGNOSIS — R1011 Right upper quadrant pain: Secondary | ICD-10-CM

## 2019-03-22 ENCOUNTER — Other Ambulatory Visit: Payer: Self-pay

## 2019-03-22 ENCOUNTER — Ambulatory Visit
Admission: RE | Admit: 2019-03-22 | Discharge: 2019-03-22 | Disposition: A | Discharge status: Home / Self Care | Payer: Medicaid Other | Source: Ambulatory Visit | Attending: Gastroenterology | Admitting: Gastroenterology

## 2019-03-22 DIAGNOSIS — R1110 Vomiting, unspecified: Secondary | ICD-10-CM | POA: Diagnosis present

## 2019-03-22 DIAGNOSIS — R1012 Left upper quadrant pain: Secondary | ICD-10-CM | POA: Insufficient documentation

## 2019-03-22 DIAGNOSIS — R1011 Right upper quadrant pain: Secondary | ICD-10-CM | POA: Diagnosis not present

## 2019-04-11 ENCOUNTER — Other Ambulatory Visit: Payer: Self-pay | Admitting: Gastroenterology

## 2019-04-11 DIAGNOSIS — R1084 Generalized abdominal pain: Secondary | ICD-10-CM

## 2019-04-11 DIAGNOSIS — R933 Abnormal findings on diagnostic imaging of other parts of digestive tract: Secondary | ICD-10-CM

## 2019-04-11 DIAGNOSIS — R140 Abdominal distension (gaseous): Secondary | ICD-10-CM

## 2019-04-13 ENCOUNTER — Emergency Department
Admission: EM | Admit: 2019-04-13 | Discharge: 2019-04-13 | Disposition: A | Discharge status: Home / Self Care | Payer: Medicaid Other | Attending: Emergency Medicine | Admitting: Emergency Medicine

## 2019-04-13 ENCOUNTER — Encounter: Payer: Self-pay | Admitting: Intensive Care

## 2019-04-13 ENCOUNTER — Other Ambulatory Visit: Payer: Self-pay

## 2019-04-13 DIAGNOSIS — F17210 Nicotine dependence, cigarettes, uncomplicated: Secondary | ICD-10-CM | POA: Diagnosis not present

## 2019-04-13 DIAGNOSIS — R1010 Upper abdominal pain, unspecified: Secondary | ICD-10-CM | POA: Diagnosis present

## 2019-04-13 DIAGNOSIS — J45909 Unspecified asthma, uncomplicated: Secondary | ICD-10-CM | POA: Insufficient documentation

## 2019-04-13 DIAGNOSIS — R1013 Epigastric pain: Secondary | ICD-10-CM | POA: Diagnosis not present

## 2019-04-13 DIAGNOSIS — J449 Chronic obstructive pulmonary disease, unspecified: Secondary | ICD-10-CM | POA: Diagnosis not present

## 2019-04-13 DIAGNOSIS — K2950 Unspecified chronic gastritis without bleeding: Secondary | ICD-10-CM | POA: Insufficient documentation

## 2019-04-13 LAB — URINALYSIS, COMPLETE (UACMP) WITH MICROSCOPIC
Bacteria, UA: NONE SEEN
Bilirubin Urine: NEGATIVE
Glucose, UA: NEGATIVE mg/dL
Hgb urine dipstick: NEGATIVE
Ketones, ur: NEGATIVE mg/dL
Leukocytes,Ua: NEGATIVE
Nitrite: NEGATIVE
Protein, ur: NEGATIVE mg/dL
Specific Gravity, Urine: 1.018 (ref 1.005–1.030)
pH: 6 (ref 5.0–8.0)

## 2019-04-13 LAB — CBC
HCT: 39.8 % (ref 36.0–46.0)
Hemoglobin: 13.4 g/dL (ref 12.0–15.0)
MCH: 30 pg (ref 26.0–34.0)
MCHC: 33.7 g/dL (ref 30.0–36.0)
MCV: 89 fL (ref 80.0–100.0)
Platelets: 355 10*3/uL (ref 150–400)
RBC: 4.47 MIL/uL (ref 3.87–5.11)
RDW: 14.2 % (ref 11.5–15.5)
WBC: 10.1 10*3/uL (ref 4.0–10.5)
nRBC: 0 % (ref 0.0–0.2)

## 2019-04-13 LAB — COMPREHENSIVE METABOLIC PANEL
ALT: 18 U/L (ref 0–44)
AST: 22 U/L (ref 15–41)
Albumin: 4 g/dL (ref 3.5–5.0)
Alkaline Phosphatase: 108 U/L (ref 38–126)
Anion gap: 13 (ref 5–15)
BUN: 16 mg/dL (ref 6–20)
CO2: 19 mmol/L — ABNORMAL LOW (ref 22–32)
Calcium: 8.9 mg/dL (ref 8.9–10.3)
Chloride: 106 mmol/L (ref 98–111)
Creatinine, Ser: 0.84 mg/dL (ref 0.44–1.00)
GFR calc Af Amer: >60 mL/min (ref >60)
GFR calc non Af Amer: >60 mL/min (ref >60)
Glucose, Bld: 111 mg/dL — ABNORMAL HIGH (ref 70–99)
Potassium: 4 mmol/L (ref 3.5–5.1)
Sodium: 138 mmol/L (ref 135–145)
Total Bilirubin: 0.6 mg/dL (ref 0.3–1.2)
Total Protein: 7.2 g/dL (ref 6.5–8.1)

## 2019-04-13 LAB — LIPASE, BLOOD: Lipase: 28 U/L (ref 11–51)

## 2019-04-13 MED ORDER — ONDANSETRON HCL 4 MG/2ML IJ SOLN
4.0000 mg | Freq: Once | INTRAMUSCULAR | Status: AC
  Administered 2019-04-13: 4 mg via INTRAVENOUS
  Filled 2019-04-13: qty 2

## 2019-04-13 MED ORDER — ALUM & MAG HYDROXIDE-SIMETH 200-200-20 MG/5ML PO SUSP
15.0000 mL | Freq: Once | ORAL | Status: AC
  Administered 2019-04-13: 15 mL via ORAL
  Filled 2019-04-13: qty 30

## 2019-04-13 MED ORDER — LIDOCAINE VISCOUS HCL 2 % MT SOLN
15.0000 mL | Freq: Once | OROMUCOSAL | Status: AC
  Administered 2019-04-13: 15 mL via ORAL
  Filled 2019-04-13: qty 15

## 2019-04-13 MED ORDER — HYDROMORPHONE HCL 1 MG/ML IJ SOLN
0.5000 mg | Freq: Once | INTRAMUSCULAR | Status: AC
  Administered 2019-04-13: 0.5 mg via INTRAVENOUS
  Filled 2019-04-13: qty 1

## 2019-04-13 MED ORDER — LACTATED RINGERS IV BOLUS
1000.0000 mL | Freq: Once | INTRAVENOUS | Status: AC
  Administered 2019-04-13: 1000 mL via INTRAVENOUS

## 2019-04-13 MED ORDER — LIDOCAINE VISCOUS HCL 2 % MT SOLN: 15.0000 mL | OROMUCOSAL | 0 refills | Status: DC | PRN

## 2019-04-13 NOTE — ED Provider Notes (Signed)
[facility] Emergency Department Provider Note   ____________________________________________   First MD Initiated Contact with Patient 04/13/19 1351     (approximate)  I have reviewed the triage vital signs and the nursing notes.   HISTORY  Chief Complaint Abdominal Pain    HPI Shawna [name] is a 53 y.o. female with past medical history of COPD, GERD, opiate use disorder, and hysterectomy presents to the ED complaining of abdominal pain.  Patient reports that she has been dealing with intermittent upper abdominal discomfort for at least the past month.  It is associated with sensation of distention in her abdomen as well as frequent belching.  She has not noticed any changes in her bowel movements and denies any blood in her stool.  She has been seen in the ED for this problem on multiple occasions, with negative CT scans.  She has since followed up with GI, started on Dexilant and Carafate with minimal improvement in her symptoms.  She denies any fevers, cough, chest pain, shortness of breath, dysuria, or hematuria.  Her work-up thus far has been negative for celiac disease, has CT enterography scheduled for next week.        Past Medical History:  Diagnosis Date  . Anxiety   . Asthma   . Bulging lumbar disc   . Bulging lumbar disc   . GERD (gastroesophageal reflux disease)   . Insomnia   . Kidney stone   . Pneumonia DEC 2016  . Sciatic leg pain    Right side  . Sciatica of right side   . Weakness    Bil knees, and ankles    Patient Active Problem List   Diagnosis Date Noted  . Visual hallucinations 07/30/2017  . Colitis 01/22/2017  . Dyslipidemia 07/26/2016  . Low vitamin B12 level 07/26/2016  . Major depressive disorder, recurrent severe without psychotic features ([place]) 07/23/2016  . Overdose of sedative or hypnotic 07/23/2016  . COPD exacerbation ([place]) 07/23/2016  . GERD (gastroesophageal reflux disease) 07/23/2016  . Cannabis use  disorder, moderate, dependence ([place]) 07/23/2016  . Tobacco use disorder 07/23/2016  . Sedative, hypnotic or anxiolytic use disorder, severe, dependence ([place]) 07/23/2016  . Bacterial pneumonia 01/23/2015  . Leukocytosis 01/23/2015  . Sepsis ([place]) 01/23/2015  . Hypoxemia 01/23/2015  . Opioid use disorder, moderate, dependence ([place]) 12/31/2014  . Anxiety disorder, unspecified 12/31/2014    Past Surgical History:  Procedure Laterality Date  . ABDOMINAL HYSTERECTOMY     Partial  . ESOPHAGOGASTRODUODENOSCOPY (EGD) WITH PROPOFOL N/A 11/27/2015   Procedure: ESOPHAGOGASTRODUODENOSCOPY (EGD) WITH PROPOFOL;  Surgeon: Lollie Sails, MD;  Location: [facility] ENDOSCOPY;  Service: Endoscopy;  Laterality: N/A;  . SHOULDER SURGERY Right   . TONSILLECTOMY    . TUBAL LIGATION      Prior to Admission medications   Medication Sig Start Date End Date Taking? Authorizing Provider  acetaminophen (TYLENOL) 500 MG tablet Take 500 mg by mouth every 6 (six) hours as needed.    [provider]  albuterol (PROVENTIL HFA;VENTOLIN HFA) 108 (90 Base) MCG/ACT inhaler Inhale 2 puffs into the lungs every 6 (six) hours as needed for wheezing or shortness of breath. 01/16/16   Daymon Larsen, MD  aluminum-magnesium hydroxide-simethicone (MAALOX) 200-200-20 MG/5ML SUSP Take 30 mLs by mouth 4 (four) times daily -  before meals and at bedtime. 02/28/19   Carrie Mew, MD  clonazePAM (KLONOPIN) 0.5 MG tablet Take 0.5 mg by mouth 2 (two) times daily as needed for anxiety.  [provider]  dexlansoprazole (DEXILANT) 60 MG capsule Take 1 capsule by mouth daily.    [provider]  dicyclomine (BENTYL) 20 MG tablet Take 1 tablet (20 mg total) by mouth 3 (three) times daily as needed for spasms. 03/04/19 03/03/20  Triplett, Rulon Eisenmengerari B, FNP  DULoxetine 40 MG CPEP Take 40 mg by mouth daily. 07/27/16   Pucilowska, Braulio ConteJolanta B, MD  famotidine (PEPCID) 20 MG tablet Take 1 tablet (20 mg total) by mouth 2 (two) times  daily. 02/28/19   Sharman CheekStafford, Phillip, MD  fexofenadine (ALLEGRA) 180 MG tablet Take 180 mg by mouth daily.    [provider]  fluticasone (FLONASE) 50 MCG/ACT nasal spray Place 2 sprays into both nostrils daily.     [provider]  hydrOXYzine (ATARAX/VISTARIL) 50 MG tablet Take 50 mg by mouth every 6 (six) hours as needed.     [provider]  lidocaine (XYLOCAINE) 2 % solution Use as directed 15 mLs in the mouth or throat as needed for mouth pain. 04/13/19   Chesley NoonJessup, Maysin Carstens, MD  methylPREDNISolone (MEDROL DOSEPAK) 4 MG TBPK tablet Take Tapered dose as directed 11/16/18   Joni ReiningSmith, Ronald K, PA-C  metoCLOPramide (REGLAN) 10 MG tablet Take 1 tablet (10 mg total) by mouth every 6 (six) hours as needed. 02/28/19   Sharman CheekStafford, Phillip, MD  ondansetron (ZOFRAN ODT) 4 MG disintegrating tablet Take 1 tablet (4 mg total) by mouth every 8 (eight) hours as needed for nausea or vomiting. 03/11/19   Arnaldo NatalMalinda, Paul F, MD  ondansetron (ZOFRAN ODT) 8 MG disintegrating tablet Take 1 tablet (8 mg total) by mouth every 8 (eight) hours as needed for nausea or vomiting. 03/08/19   Dionne BucySiadecki, Sebastian, MD  ondansetron (ZOFRAN-ODT) 4 MG disintegrating tablet Take 1 tablet (4 mg total) by mouth every 8 (eight) hours as needed for nausea or vomiting. 02/03/18   Cuthriell, Delorise RoyalsJonathan D, PA-C  oxyCODONE-acetaminophen (PERCOCET) 5-325 MG tablet Take 1 tablet by mouth every 4 (four) hours as needed for severe pain. 03/11/19 03/10/20  Arnaldo NatalMalinda, Paul F, MD  predniSONE (STERAPRED UNI-PAK 21 TAB) 10 MG (21) TBPK tablet Take 6 tablets on the first day and decrease by 1 tablet each day until finished. 03/04/19   Triplett, Rulon Eisenmengerari B, FNP  traMADol (ULTRAM) 50 MG tablet Take 1 tablet (50 mg total) by mouth every 6 (six) hours as needed. 11/16/18 11/16/19  Joni ReiningSmith, Ronald K, PA-C  zolpidem (AMBIEN) 10 MG tablet Take 10 mg by mouth at bedtime as needed for sleep.    [provider]    Allergies Ciprofloxacin hcl,  Fentanyl, Haldol [haloperidol], Morphine and related, Phenergan [promethazine hcl], Propofol, and Vicodin [hydrocodone-acetaminophen]  Family History  Problem Relation Age of Onset  . Fibromyalgia Mother     Social History Social History   Tobacco Use  . Smoking status: Current Every Day Smoker    Packs/day: 1.00    Years: 40.00    Pack years: 40.00    Types: Cigarettes  . Smokeless tobacco: Current User  Substance Use Topics  . Alcohol use: Yes    Comment: occ  . Drug use: No    Review of Systems  Constitutional: No fever/chills Eyes: No visual changes. ENT: No sore throat. Cardiovascular: Denies chest pain. Respiratory: Denies shortness of breath. Gastrointestinal: Positive for abdominal pain.  Positive for nausea and vomiting.  No diarrhea.  No constipation. Genitourinary: Negative for dysuria. Musculoskeletal: Negative for back pain. Skin: Negative for rash. Neurological: Negative for headaches, focal weakness or  numbness.  ____________________________________________   PHYSICAL EXAM:  VITAL SIGNS: ED Triage Vitals  Enc Vitals Group     BP --      Pulse Rate 04/13/19 1246 69     Resp 04/13/19 1246 16     Temp 04/13/19 1246 98.2 F (36.8 C)     Temp Source 04/13/19 1246 Oral     SpO2 04/13/19 1246 96 %     Weight 04/13/19 1247 169 lb (76.7 kg)     Height 04/13/19 1247 5\' 6"  (1.676 m)     Head Circumference --      Peak Flow --      Pain Score 04/13/19 1247 9     Pain Loc --      Pain Edu? --      Excl. in GC? --     Constitutional: Alert and oriented. Eyes: Conjunctivae are normal. Head: Atraumatic. Nose: No congestion/rhinnorhea. Mouth/Throat: Mucous membranes are moist. Neck: Normal ROM Cardiovascular: Normal rate, regular rhythm. Grossly normal heart sounds. Respiratory: Normal respiratory effort.  No retractions. Lungs CTAB. Gastrointestinal: Soft and tender to palpation in the left upper quadrant without rebound or guarding. No  distention. Genitourinary: deferred Musculoskeletal: No lower extremity tenderness nor edema. Neurologic:  Normal speech and language. No gross focal neurologic deficits are appreciated. Skin:  Skin is warm, dry and intact. No rash noted. Psychiatric: Mood and affect are normal. Speech and behavior are normal.  ____________________________________________   LABS (all labs ordered are listed, but only abnormal results are displayed)  Labs Reviewed  COMPREHENSIVE METABOLIC PANEL - Abnormal; Notable for the following components:      Result Value   CO2 19 (*)    Glucose, Bld 111 (*)    All other components within normal limits  URINALYSIS, COMPLETE (UACMP) WITH MICROSCOPIC - Abnormal; Notable for the following components:   Color, Urine YELLOW (*)    APPearance HAZY (*)    All other components within normal limits  LIPASE, BLOOD  CBC   ____________________________________________   PROCEDURES  Procedure(s) performed (including Critical Care):  Procedures   ____________________________________________   INITIAL IMPRESSION / ASSESSMENT AND PLAN / ED COURSE       53 year old female presents to the ED for intermittent upper abdominal pain going on for least the past month.  She has had multiple negative CT scans to this point and work-up with GI has also been negative.  She has CT enterography scheduled for next week to further assess for Crohn's disease, although inflammatory markers have been relatively unremarkable.  She has some tenderness to palpation in her left upper quadrant today, similar to prior ED visits.  I do not feel repeat CT would be beneficial in her case given it has been negative in the past and symptoms sound most consistent with gastritis/PUD.  Lab work thus far has been unremarkable, LFTs and lipase within normal limits.  We will treat symptomatically with nausea medicine, pain medication, and GI cocktail, reevaluate afterwards.  Patient feeling better  following GI cocktail.  Lab work is unremarkable, including LFTs and lipase.  UA without evidence of infection.  Patient states she continues to have difficulty passing gas, and I have counseled her to have a trial without Bentyl.  We will also prescribe viscous lidocaine for use as needed, counseled patient to follow-up with GI.  Patient agrees with plan.      ____________________________________________   FINAL CLINICAL IMPRESSION(S) / ED DIAGNOSES  Final diagnoses:  Epigastric pain  Chronic gastritis  without bleeding, unspecified gastritis type     ED Discharge Orders         Ordered    lidocaine (XYLOCAINE) 2 % solution  As needed     04/13/19 1602           Note:  This document was prepared using Dragon voice recognition software and may include unintentional dictation errors.   Chesley Noon, MD 04/13/19 2104

## 2019-04-13 NOTE — ED Triage Notes (Signed)
Patient states "my stomach is blowing up like a balloon and I cannot pass gas" Reports belching a lot. Has been here for same X4. Reports seeing GI and they told her she has a hiatal hernia.

## 2019-04-15 ENCOUNTER — Encounter: Payer: Self-pay | Admitting: Emergency Medicine

## 2019-04-15 ENCOUNTER — Other Ambulatory Visit: Payer: Self-pay

## 2019-04-15 ENCOUNTER — Emergency Department
Admission: EM | Admit: 2019-04-15 | Discharge: 2019-04-15 | Disposition: A | Discharge status: Home / Self Care | Payer: Medicaid Other | Attending: Emergency Medicine | Admitting: Emergency Medicine

## 2019-04-15 DIAGNOSIS — R1013 Epigastric pain: Secondary | ICD-10-CM | POA: Insufficient documentation

## 2019-04-15 DIAGNOSIS — R55 Syncope and collapse: Secondary | ICD-10-CM | POA: Diagnosis not present

## 2019-04-15 DIAGNOSIS — J449 Chronic obstructive pulmonary disease, unspecified: Secondary | ICD-10-CM | POA: Insufficient documentation

## 2019-04-15 DIAGNOSIS — F17210 Nicotine dependence, cigarettes, uncomplicated: Secondary | ICD-10-CM | POA: Insufficient documentation

## 2019-04-15 DIAGNOSIS — R110 Nausea: Secondary | ICD-10-CM | POA: Diagnosis not present

## 2019-04-15 DIAGNOSIS — R1010 Upper abdominal pain, unspecified: Secondary | ICD-10-CM

## 2019-04-15 DIAGNOSIS — J45909 Unspecified asthma, uncomplicated: Secondary | ICD-10-CM | POA: Insufficient documentation

## 2019-04-15 DIAGNOSIS — Z79899 Other long term (current) drug therapy: Secondary | ICD-10-CM | POA: Insufficient documentation

## 2019-04-15 LAB — URINALYSIS, COMPLETE (UACMP) WITH MICROSCOPIC
Bilirubin Urine: NEGATIVE
Glucose, UA: NEGATIVE mg/dL
Hgb urine dipstick: NEGATIVE
Ketones, ur: NEGATIVE mg/dL
Leukocytes,Ua: NEGATIVE
Nitrite: NEGATIVE
Protein, ur: NEGATIVE mg/dL
Specific Gravity, Urine: 1.006 (ref 1.005–1.030)
WBC, UA: NONE SEEN WBC/hpf (ref 0–5)
pH: 6 (ref 5.0–8.0)

## 2019-04-15 LAB — COMPREHENSIVE METABOLIC PANEL
ALT: 17 U/L (ref 0–44)
AST: 20 U/L (ref 15–41)
Albumin: 4.1 g/dL (ref 3.5–5.0)
Alkaline Phosphatase: 103 U/L (ref 38–126)
Anion gap: 9 (ref 5–15)
BUN: 16 mg/dL (ref 6–20)
CO2: 23 mmol/L (ref 22–32)
Calcium: 9 mg/dL (ref 8.9–10.3)
Chloride: 106 mmol/L (ref 98–111)
Creatinine, Ser: 0.84 mg/dL (ref 0.44–1.00)
GFR calc Af Amer: >60 mL/min (ref >60)
GFR calc non Af Amer: >60 mL/min (ref >60)
Glucose, Bld: 154 mg/dL — ABNORMAL HIGH (ref 70–99)
Potassium: 4.1 mmol/L (ref 3.5–5.1)
Sodium: 138 mmol/L (ref 135–145)
Total Bilirubin: 0.7 mg/dL (ref 0.3–1.2)
Total Protein: 7.3 g/dL (ref 6.5–8.1)

## 2019-04-15 LAB — CBC
HCT: 38.3 % (ref 36.0–46.0)
Hemoglobin: 13.2 g/dL (ref 12.0–15.0)
MCH: 29.4 pg (ref 26.0–34.0)
MCHC: 34.5 g/dL (ref 30.0–36.0)
MCV: 85.3 fL (ref 80.0–100.0)
Platelets: 332 10*3/uL (ref 150–400)
RBC: 4.49 MIL/uL (ref 3.87–5.11)
RDW: 14.4 % (ref 11.5–15.5)
WBC: 7.2 10*3/uL (ref 4.0–10.5)
nRBC: 0 % (ref 0.0–0.2)

## 2019-04-15 LAB — LIPASE, BLOOD: Lipase: 26 U/L (ref 11–51)

## 2019-04-15 MED ORDER — LIDOCAINE VISCOUS HCL 2 % MT SOLN
15.0000 mL | Freq: Once | OROMUCOSAL | Status: AC
  Administered 2019-04-15: 15 mL via ORAL
  Filled 2019-04-15: qty 15

## 2019-04-15 MED ORDER — SODIUM CHLORIDE 0.9% FLUSH: 3.0000 mL | Freq: Once | INTRAVENOUS | Status: DC

## 2019-04-15 MED ORDER — ALUM & MAG HYDROXIDE-SIMETH 200-200-20 MG/5ML PO SUSP
30.0000 mL | Freq: Once | ORAL | Status: AC
  Administered 2019-04-15: 30 mL via ORAL
  Filled 2019-04-15: qty 30

## 2019-04-15 MED ORDER — OXYCODONE-ACETAMINOPHEN 5-325 MG PO TABS: 1.0000 | ORAL_TABLET | Freq: Four times a day (QID) | ORAL | 0 refills | Status: AC | PRN

## 2019-04-15 MED ORDER — HYDROMORPHONE HCL 1 MG/ML IJ SOLN
0.5000 mg | Freq: Once | INTRAMUSCULAR | Status: AC
  Administered 2019-04-15: 0.5 mg via INTRAVENOUS
  Filled 2019-04-15: qty 1

## 2019-04-15 MED ORDER — SODIUM CHLORIDE 0.9 % IV BOLUS
1000.0000 mL | Freq: Once | INTRAVENOUS | Status: AC
  Administered 2019-04-15: 1000 mL via INTRAVENOUS

## 2019-04-15 MED ORDER — ONDANSETRON HCL 4 MG/2ML IJ SOLN
4.0000 mg | Freq: Once | INTRAMUSCULAR | Status: AC
  Administered 2019-04-15: 4 mg via INTRAVENOUS
  Filled 2019-04-15: qty 2

## 2019-04-15 MED ORDER — ONDANSETRON HCL 4 MG/2ML IJ SOLN
4.0000 mg | Freq: Once | INTRAMUSCULAR | Status: AC
  Administered 2019-04-15: 4 mg via INTRAVENOUS

## 2019-04-15 MED ORDER — ONDANSETRON HCL 4 MG/2ML IJ SOLN
INTRAMUSCULAR | Status: AC
  Filled 2019-04-15: qty 2

## 2019-04-15 NOTE — ED Triage Notes (Signed)
abdominal pain x several months. States has had several work ups here. States following with [facility] clinic, Dr. Cornelia Copa.

## 2019-04-15 NOTE — ED Provider Notes (Signed)
[facility] Emergency Department Provider Note ____________________________________________   First MD Initiated Contact with Patient 04/15/19 [phone]     (approximate)  I have reviewed the triage vital signs and the nursing notes.   HISTORY  Chief Complaint Abdominal Pain    HPI Shawna [name] is a 53 y.o. female with PMH as noted below who presents with persistent mid and upper abdominal pain over the last few months, severe in intensity, not relieved by Dexilant and Carafate which she is currently on, and associated with nausea.  The patient states that her p.o. intake has been decreased and she has been feeling weak.  She states that she felt so weak today that she syncopized.  She states that this pain is the same as what she has been dealing with for the last 2 months.  She has no change in her bowel movements and no fever.  She has CT enterography scheduled for 4 days from now.  Past Medical History:  Diagnosis Date  . Anxiety   . Asthma   . Bulging lumbar disc   . Bulging lumbar disc   . GERD (gastroesophageal reflux disease)   . Insomnia   . Kidney stone   . Pneumonia DEC 2016  . Sciatic leg pain    Right side  . Sciatica of right side   . Weakness    Bil knees, and ankles    Patient Active Problem List   Diagnosis Date Noted  . Visual hallucinations 07/30/2017  . Colitis 01/22/2017  . Dyslipidemia 07/26/2016  . Low vitamin B12 level 07/26/2016  . Major depressive disorder, recurrent severe without psychotic features ([place]) 07/23/2016  . Overdose of sedative or hypnotic 07/23/2016  . COPD exacerbation ([place]) 07/23/2016  . GERD (gastroesophageal reflux disease) 07/23/2016  . Cannabis use disorder, moderate, dependence ([place]) 07/23/2016  . Tobacco use disorder 07/23/2016  . Sedative, hypnotic or anxiolytic use disorder, severe, dependence ([place]) 07/23/2016  . Bacterial pneumonia 01/23/2015  . Leukocytosis 01/23/2015  . Sepsis ([place]) 01/23/2015   . Hypoxemia 01/23/2015  . Opioid use disorder, moderate, dependence ([place]) 12/31/2014  . Anxiety disorder, unspecified 12/31/2014    Past Surgical History:  Procedure Laterality Date  . ABDOMINAL HYSTERECTOMY     Partial  . ESOPHAGOGASTRODUODENOSCOPY (EGD) WITH PROPOFOL N/A 11/27/2015   Procedure: ESOPHAGOGASTRODUODENOSCOPY (EGD) WITH PROPOFOL;  Surgeon: Lollie Sails, MD;  Location[facility] ENDOSCOPY;  Service: Endoscopy;  Laterality: N/A;  . SHOULDER SURGERY Right   . TONSILLECTOMY    . TUBAL LIGATION      Prior to Admission medications   Medication Sig Start Date End Date Taking? Authorizing Provider  acetaminophen (TYLENOL) 500 MG tablet Take 500 mg by mouth every 6 (six) hours as needed.    [provider]  albuterol (PROVENTIL HFA;VENTOLIN HFA) 108 (90 Base) MCG/ACT inhaler Inhale 2 puffs into the lungs every 6 (six) hours as needed for wheezing or shortness of breath. 01/16/16   Daymon Larsen, MD  aluminum-magnesium hydroxide-simethicone (MAALOX) 200-200-20 MG/5ML SUSP Take 30 mLs by mouth 4 (four) times daily -  before meals and at bedtime. 02/28/19   Carrie Mew, MD  clonazePAM (KLONOPIN) 0.5 MG tablet Take 0.5 mg by mouth 2 (two) times daily as needed for anxiety.    [provider]  dexlansoprazole (DEXILANT) 60 MG capsule Take 1 capsule by mouth daily.    [provider]  dicyclomine (BENTYL) 20 MG tablet Take 1 tablet (20 mg total) by mouth 3 (three) times  daily as needed for spasms. 03/04/19 03/03/20  Triplett, Rulon Eisenmenger B, FNP  DULoxetine 40 MG CPEP Take 40 mg by mouth daily. 07/27/16   Pucilowska, Braulio Conte B, MD  famotidine (PEPCID) 20 MG tablet Take 1 tablet (20 mg total) by mouth 2 (two) times daily. 02/28/19   Sharman Cheek, MD  fexofenadine (ALLEGRA) 180 MG tablet Take 180 mg by mouth daily.    [provider]  fluticasone (FLONASE) 50 MCG/ACT nasal spray Place 2 sprays into both nostrils daily.     [provider]    hydrOXYzine (ATARAX/VISTARIL) 50 MG tablet Take 50 mg by mouth every 6 (six) hours as needed.     [provider]  lidocaine (XYLOCAINE) 2 % solution Use as directed 15 mLs in the mouth or throat as needed for mouth pain. 04/13/19   Chesley Noon, MD  methylPREDNISolone (MEDROL DOSEPAK) 4 MG TBPK tablet Take Tapered dose as directed 11/16/18   Joni Reining, PA-C  metoCLOPramide (REGLAN) 10 MG tablet Take 1 tablet (10 mg total) by mouth every 6 (six) hours as needed. 02/28/19   Sharman Cheek, MD  ondansetron (ZOFRAN ODT) 4 MG disintegrating tablet Take 1 tablet (4 mg total) by mouth every 8 (eight) hours as needed for nausea or vomiting. 03/11/19   Arnaldo Natal, MD  ondansetron (ZOFRAN ODT) 8 MG disintegrating tablet Take 1 tablet (8 mg total) by mouth every 8 (eight) hours as needed for nausea or vomiting. 03/08/19   Dionne Bucy, MD  ondansetron (ZOFRAN-ODT) 4 MG disintegrating tablet Take 1 tablet (4 mg total) by mouth every 8 (eight) hours as needed for nausea or vomiting. 02/03/18   Cuthriell, Delorise Royals, PA-C  oxyCODONE-acetaminophen (PERCOCET) 5-325 MG tablet Take 1 tablet by mouth every 6 (six) hours as needed for up to 5 days for severe pain. 04/15/19 04/20/19  Dionne Bucy, MD  predniSONE (STERAPRED UNI-PAK 21 TAB) 10 MG (21) TBPK tablet Take 6 tablets on the first day and decrease by 1 tablet each day until finished. 03/04/19   Triplett, Rulon Eisenmenger B, FNP  traMADol (ULTRAM) 50 MG tablet Take 1 tablet (50 mg total) by mouth every 6 (six) hours as needed. 11/16/18 11/16/19  Joni Reining, PA-C  zolpidem (AMBIEN) 10 MG tablet Take 10 mg by mouth at bedtime as needed for sleep.    [provider]    Allergies Ciprofloxacin hcl, Fentanyl, Haldol [haloperidol], Morphine and related, Phenergan [promethazine hcl], Propofol, and Vicodin [hydrocodone-acetaminophen]  Family History  Problem Relation Age of Onset  . Fibromyalgia Mother     Social  History Social History   Tobacco Use  . Smoking status: Current Every Day Smoker    Packs/day: 1.00    Years: 40.00    Pack years: 40.00    Types: Cigarettes  . Smokeless tobacco: Current User  Substance Use Topics  . Alcohol use: Yes    Comment: occ  . Drug use: No    Review of Systems  Constitutional: No fever. Eyes: No redness. ENT: No sore throat. Cardiovascular: Denies chest pain. Respiratory: Denies shortness of breath. Gastrointestinal: Positive for nausea. Genitourinary: Negative for dysuria.  Musculoskeletal: Negative for back pain. Skin: Negative for rash. Neurological: Negative for headache.   ____________________________________________   PHYSICAL EXAM:  VITAL SIGNS: ED Triage Vitals  Enc Vitals Group     BP 04/15/19 0731 128/83     Pulse Rate 04/15/19 0731 89     Resp 04/15/19 0731 20     Temp 04/15/19 0731  98.3 F (36.8 C)     Temp Source 04/15/19 0731 Oral     SpO2 04/15/19 0731 92 %     Weight 04/15/19 0732 169 lb (76.7 kg)     Height 04/15/19 0732  (1.676 m)     Head Circumference --      Peak Flow --      Pain Score 04/15/19 0732 9     Pain Loc --      Pain Edu? --      Excl. in GC? --     Constitutional: Alert and oriented.  Somewhat uncomfortable appearing but in no acute distress. Eyes: Conjunctivae are normal.  No scleral icterus. Head: Atraumatic. Nose: No congestion/rhinnorhea. Mouth/Throat: Mucous membranes are moist.   Neck: Normal range of motion.  Cardiovascular: Good peripheral circulation. Respiratory: Normal respiratory effort.  No retractions.  Gastrointestinal: Soft with mild epigastric and left upper quadrant tenderness.  No distention.  Genitourinary: No flank tenderness. Musculoskeletal:  Extremities warm and well perfused.  Neurologic:  Normal speech and language. No gross focal neurologic deficits are appreciated.  Skin:  Skin is warm and dry. No rash noted. Psychiatric: Mood and affect are normal. Speech  and behavior are normal.  ____________________________________________   LABS (all labs ordered are listed, but only abnormal results are displayed)  Labs Reviewed  COMPREHENSIVE METABOLIC PANEL - Abnormal; Notable for the following components:      Result Value   Glucose, Bld 154 (*)    All other components within normal limits  URINALYSIS, COMPLETE (UACMP) WITH MICROSCOPIC - Abnormal; Notable for the following components:   Color, Urine STRAW (*)    APPearance CLEAR (*)    Bacteria, UA RARE (*)    All other components within normal limits  LIPASE, BLOOD  CBC   ____________________________________________  EKG  ED ECG REPORT I, Dionne Bucy, the attending physician, personally viewed and interpreted this ECG.  Date: 04/15/2019 EKG Time: 0742 Rate: 77 Rhythm: normal sinus rhythm QRS Axis: normal Intervals: normal ST/T Wave abnormalities: normal Narrative Interpretation: no evidence of acute ischemia  ____________________________________________  RADIOLOGY    ____________________________________________   PROCEDURES  Procedure(s) performed: No  Procedures  Critical Care performed: No ____________________________________________   INITIAL IMPRESSION / ASSESSMENT AND PLAN / ED COURSE  Pertinent labs & imaging results that were available during my care of the patient were reviewed by me and considered in my medical decision making (see chart for details).  53 year old female with PMH as noted above and who is currently being worked up for chronic abdominal pain over the last few months presents with persistent mid and upper abdominal pain which she states is identical to the pain she has been seen several times for over the last 2 months.  I reviewed the past medical records in Epic.  The patient has had several prior ED visits for this beginning on 11/3.  She was most recently seen 2 days ago, and had improvement in her symptoms after a GI cocktail.   She previously had been on Bentyl which was discontinued, and was started on viscous lidocaine at her last visit but she states that this has not helped.  She had CTs on 11/7 and 11/11 and a right upper quadrant ultrasound on 11/3 that were negative for acute findings.  She is being followed by Dr. Norma Fredrickson from GI and is scheduled for CT enterography on 12/23.  Symptoms are thought to be due to possible gastritis, versus Crohn's disease although inflammatory markers  have been unremarkable.  On exam the patient is overall relatively well-appearing.  Her vital signs are normal.  She has mild left upper quadrant tenderness, similar to prior ED visits.  The remainder of the exam is unremarkable.  Presentation is overall consistent with gastritis, PUD, or inflammatory bowel disease.  Given that the patient has had no change in her symptoms and is not having any new symptoms, along with multiple recent negative imaging studies, there is no indication for emergent repeat imaging at this time.  The patient reports a syncope today although states that she has been feeling weak due to decreased p.o. intake, and I suspect that this was vasovagal syncope related to pain, or dehydration.  We will obtain labs, give a fluid bolus, pain medication for symptomatic treatment, and reassess.  ----------------------------------------- 3:17 PM on 04/15/2019 -----------------------------------------  The patient was feeling significantly better after fluids, pain medication and a GI cocktail.  The lab work-up is unremarkable.  She is now having some recurrence of the pain although it is much less severe.  I had an extensive discussion with the patient about her work-up and plan of care.  The pain today appears to be a continuation or exacerbation of her ongoing abdominal pain for which she is being worked up.  I encouraged her to keep the appointment for the CT and the follow-up that she has planned with her  gastroenterologist.  I gave her very thorough return precautions and she expressed understanding.  I have prescribed a small quantity of Percocet for breakthrough pain to cover her until she can get the scan and follow-up.   ____________________________________________   FINAL CLINICAL IMPRESSION(S) / ED DIAGNOSES  Final diagnoses:  Pain of upper abdomen  Syncope, unspecified syncope type      NEW MEDICATIONS STARTED DURING THIS VISIT:  New Prescriptions   OXYCODONE-ACETAMINOPHEN (PERCOCET) 5-325 MG TABLET    Take 1 tablet by mouth every 6 (six) hours as needed for up to 5 days for severe pain.     Note:  This document was prepared using Dragon voice recognition software and may include unintentional dictation errors.    Dionne BucySiadecki, Maleia Weems, MD 04/15/19 [phone]

## 2019-04-15 NOTE — Discharge Instructions (Signed)
Continue to take your Dexilant and Carafate as prescribed as well as your other normal medications.  You may take the Percocet prescribed today but only as needed for more severe pain.  Follow-up for the CT this week and with your gastroenterologist as scheduled.  Return to the ER for new, worsening, or persistent severe abdominal pain, change in the location or type of your pain, fever, persistent vomiting, recurrent episodes of passing out, or any other new or worsening symptoms that concern you.

## 2019-04-18 ENCOUNTER — Emergency Department (HOSPITAL_COMMUNITY): Payer: Medicaid Other

## 2019-04-18 ENCOUNTER — Other Ambulatory Visit: Payer: Self-pay

## 2019-04-18 ENCOUNTER — Emergency Department (HOSPITAL_COMMUNITY)
Admission: EM | Admit: 2019-04-18 | Discharge: 2019-04-18 | Disposition: A | Discharge status: Home / Self Care | Payer: Medicaid Other | Attending: Emergency Medicine | Admitting: Emergency Medicine

## 2019-04-18 DIAGNOSIS — R1084 Generalized abdominal pain: Secondary | ICD-10-CM | POA: Insufficient documentation

## 2019-04-18 LAB — CBC
HCT: 43.5 % (ref 36.0–46.0)
Hemoglobin: 14.9 g/dL (ref 12.0–15.0)
MCH: 30.7 pg (ref 26.0–34.0)
MCHC: 34.3 g/dL (ref 30.0–36.0)
MCV: 89.7 fL (ref 80.0–100.0)
Platelets: 469 10*3/uL — ABNORMAL HIGH (ref 150–400)
RBC: 4.85 MIL/uL (ref 3.87–5.11)
RDW: 13.7 % (ref 11.5–15.5)
WBC: 10.3 10*3/uL (ref 4.0–10.5)
nRBC: 0 % (ref 0.0–0.2)

## 2019-04-18 LAB — COMPREHENSIVE METABOLIC PANEL
ALT: 14 U/L (ref 0–44)
AST: 19 U/L (ref 15–41)
Albumin: 4.3 g/dL (ref 3.5–5.0)
Alkaline Phosphatase: 104 U/L (ref 38–126)
Anion gap: 11 (ref 5–15)
BUN: 9 mg/dL (ref 6–20)
CO2: 25 mmol/L (ref 22–32)
Calcium: 9.4 mg/dL (ref 8.9–10.3)
Chloride: 103 mmol/L (ref 98–111)
Creatinine, Ser: 0.98 mg/dL (ref 0.44–1.00)
GFR calc Af Amer: >60 mL/min (ref >60)
GFR calc non Af Amer: >60 mL/min (ref >60)
Glucose, Bld: 139 mg/dL — ABNORMAL HIGH (ref 70–99)
Potassium: 4.5 mmol/L (ref 3.5–5.1)
Sodium: 139 mmol/L (ref 135–145)
Total Bilirubin: 0.7 mg/dL (ref 0.3–1.2)
Total Protein: 7.2 g/dL (ref 6.5–8.1)

## 2019-04-18 LAB — LIPASE, BLOOD: Lipase: 19 U/L (ref 11–51)

## 2019-04-18 LAB — URINALYSIS, ROUTINE W REFLEX MICROSCOPIC
Bilirubin Urine: NEGATIVE
Glucose, UA: NEGATIVE mg/dL
Hgb urine dipstick: NEGATIVE
Ketones, ur: NEGATIVE mg/dL
Leukocytes,Ua: NEGATIVE
Nitrite: NEGATIVE
Protein, ur: NEGATIVE mg/dL
Specific Gravity, Urine: 1.014 (ref 1.005–1.030)
pH: 8 (ref 5.0–8.0)

## 2019-04-18 LAB — I-STAT BETA HCG BLOOD, ED (MC, WL, AP ONLY): I-stat hCG, quantitative: <5 m[IU]/mL (ref <5)

## 2019-04-18 MED ORDER — SODIUM CHLORIDE 0.9 % IV BOLUS
1000.0000 mL | Freq: Once | INTRAVENOUS | Status: AC
  Administered 2019-04-18: 1000 mL via INTRAVENOUS

## 2019-04-18 MED ORDER — MORPHINE SULFATE (PF) 4 MG/ML IV SOLN
4.0000 mg | Freq: Once | INTRAVENOUS | Status: AC
  Administered 2019-04-18: 4 mg via INTRAVENOUS
  Filled 2019-04-18: qty 1

## 2019-04-18 MED ORDER — METOCLOPRAMIDE HCL 5 MG/ML IJ SOLN
10.0000 mg | Freq: Once | INTRAMUSCULAR | Status: AC
  Administered 2019-04-18: 10 mg via INTRAVENOUS
  Filled 2019-04-18: qty 2

## 2019-04-18 MED ORDER — DICYCLOMINE HCL 10 MG PO CAPS
10.0000 mg | ORAL_CAPSULE | Freq: Once | ORAL | Status: AC
  Administered 2019-04-18: 10 mg via ORAL
  Filled 2019-04-18: qty 1

## 2019-04-18 MED ORDER — ONDANSETRON HCL 4 MG/2ML IJ SOLN
4.0000 mg | Freq: Once | INTRAMUSCULAR | Status: AC
  Administered 2019-04-18: 4 mg via INTRAVENOUS
  Filled 2019-04-18: qty 2

## 2019-04-18 MED ORDER — METOCLOPRAMIDE HCL 10 MG PO TABS: 10.0000 mg | ORAL_TABLET | Freq: Three times a day (TID) | ORAL | 0 refills | Status: DC | PRN

## 2019-04-18 MED ORDER — HYDROMORPHONE HCL 1 MG/ML IJ SOLN
1.0000 mg | Freq: Once | INTRAMUSCULAR | Status: AC
  Administered 2019-04-18: 1 mg via INTRAVENOUS
  Filled 2019-04-18: qty 1

## 2019-04-18 MED ORDER — IOHEXOL 300 MG/ML  SOLN
100.0000 mL | Freq: Once | INTRAMUSCULAR | Status: AC | PRN
  Administered 2019-04-18: 100 mL via INTRAVENOUS

## 2019-04-18 MED ORDER — SODIUM CHLORIDE 0.9% FLUSH: 3.0000 mL | Freq: Once | INTRAVENOUS | Status: DC

## 2019-04-18 NOTE — ED Provider Notes (Signed)
Patient received in sign out from [facility], [place] pending completion of CT studies in the evaluation of abdominal pain. Patient has history of IBS with diarrhea, followed by gastroenterology in [place]. She has been having nausea and vomiting, along with generalized abdominal pain since October.  Labs and radiology studies are reassuring. Patient has known avascular necrosis of the femoral heads bilaterally.  Patient is nontoxic, nonseptic appearing, in no apparent distress.  Patient's pain and other symptoms adequately managed in emergency department.  Labs, imaging and vitals reviewed.  Patient does not meet the SIRS or Sepsis criteria.  Patient does not have a surgical abdomen and there are no peritoneal signs.  No indication of appendicitis, bowel obstruction, bowel perforation, cholecystitis, diverticulitis. Patient will be discharged home with addition of reglan to treatment plan. Patient to follow-up with her gastroenterologist.   Results for orders placed or performed during the hospital encounter of 04/18/19  Lipase, blood  Result Value Ref Range   Lipase 19 11 - 51 U/L  Comprehensive metabolic panel  Result Value Ref Range   Sodium 139 135 - 145 mmol/L   Potassium 4.5 3.5 - 5.1 mmol/L   Chloride 103 98 - 111 mmol/L   CO2 25 22 - 32 mmol/L   Glucose, Bld 139 (H) 70 - 99 mg/dL   BUN 9 6 - 20 mg/dL   Creatinine, Ser 6.31 0.44 - 1.00 mg/dL   Calcium 9.4 8.9 - 49.7 mg/dL   Total Protein 7.2 6.5 - 8.1 g/dL   Albumin 4.3 3.5 - 5.0 g/dL   AST 19 15 - 41 U/L   ALT 14 0 - 44 U/L   Alkaline Phosphatase 104 38 - 126 U/L   Total Bilirubin 0.7 0.3 - 1.2 mg/dL   GFR calc non Af Amer >60 >60 mL/min   GFR calc Af Amer >60 >60 mL/min   Anion gap 11 5 - 15  CBC  Result Value Ref Range   WBC 10.3 4.0 - 10.5 K/uL   RBC 4.85 3.87 - 5.11 MIL/uL   Hemoglobin 14.9 12.0 - 15.0 g/dL   HCT 02.6 37.8 - 58.8 %   MCV 89.7 80.0 - 100.0 fL   MCH 30.7 26.0 - 34.0 pg   MCHC 34.3 30.0 - 36.0 g/dL    RDW 50.2 77.4 - 12.8 %   Platelets 469 (H) 150 - 400 K/uL   nRBC 0.0 0.0 - 0.2 %  Urinalysis, Routine w reflex microscopic  Result Value Ref Range   Color, Urine YELLOW YELLOW   APPearance CLEAR CLEAR   Specific Gravity, Urine 1.014 1.005 - 1.030   pH 8.0 5.0 - 8.0   Glucose, UA NEGATIVE NEGATIVE mg/dL   Hgb urine dipstick NEGATIVE NEGATIVE   Bilirubin Urine NEGATIVE NEGATIVE   Ketones, ur NEGATIVE NEGATIVE mg/dL   Protein, ur NEGATIVE NEGATIVE mg/dL   Nitrite NEGATIVE NEGATIVE   Leukocytes,Ua NEGATIVE NEGATIVE  I-Stat beta hCG blood, ED  Result Value Ref Range   I-stat hCG, quantitative <5.0 <5 mIU/mL   Comment 3           CT Abdomen Pelvis W Contrast  Result Date: 04/18/2019 CLINICAL DATA:  Generalized abdominal pain and vomiting and nausea. EXAM: CT ABDOMEN AND PELVIS WITH CONTRAST TECHNIQUE: Multidetector CT imaging of the abdomen and pelvis was performed using the standard protocol following bolus administration of intravenous contrast. CONTRAST:  OMNIPAQUE IOHEXOL 300 MG/ML  SOLN COMPARISON:  03/08/2019 and 03/04/2019 and 08/30/2017 FINDINGS: Lower chest: Slight emphysematous changes  at both lung bases. Small hiatal hernia. Heart size is normal. Hepatobiliary: No focal liver abnormality is seen. No gallstones, gallbladder wall thickening, or biliary dilatation. Pancreas: Unremarkable. No pancreatic ductal dilatation or surrounding inflammatory changes. Spleen: Normal in size without focal abnormality. Adrenals/Urinary Tract: Normal adrenal glands. 8 mm calcification in the lower pole the left kidney, essentially unchanged. 2 mm stone in the mid right kidney. Tiny cyst on the upper pole and 11 mm cyst on the lower pole of the right kidney. The kidneys are otherwise normal. No hydronephrosis. Bladder is normal. Stomach/Bowel: Stomach is within normal limits except for a tiny chronic hiatal hernia. Appendix appears normal but quite tiny. No evidence of bowel wall thickening,  distention, or inflammatory changes. Vascular/Lymphatic: Aortic atherosclerosis. No enlarged abdominal or pelvic lymph nodes. Reproductive: Status post hysterectomy. No adnexal masses. Other: No abdominal wall hernia or abnormality. No abdominopelvic ascites. Musculoskeletal: Bilateral avascular necrosis of the femoral heads. There is slight progression of collapse of the superior aspect of the right femoral head. No significant change of the left femoral head. Bones are otherwise normal. IMPRESSION: 1. No acute abnormality of the abdomen or pelvis. 2. 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 3. Aortic Atherosclerosis (ICD10-I70.0). Electronically Signed   By: Lorriane Shire M.D.   On: 04/18/2019 16:41      Etta Quill, NP 04/18/19 5638    Daleen Bo, MD 04/21/19 [phone]

## 2019-04-18 NOTE — Discharge Instructions (Addendum)
Continue with the bentyl. Add the reglan for persistent nausea/vomiting.

## 2019-04-18 NOTE — ED Notes (Signed)
Please give updates to sister [employer] [phone]

## 2019-04-18 NOTE — ED Triage Notes (Signed)
Pt here for continuing nausea, generalized abdominal pain, and vomiting since October. Seen for same at [facility]. Has been taking Zofran for nausea but states this is no longer working and cannot take Phenergan d/t it making her feel jittery. Pt had dark tarry soft bowel movement x 2 this morning, no frank blood. Endorses chills and shakiness.

## 2019-04-18 NOTE — ED Provider Notes (Signed)
[place] EMERGENCY DEPARTMENT Provider Note   CSN: [id number] Arrival date & time: 04/18/19  1236     History Chief Complaint  Patient presents with  . Nausea  . Rectal Bleeding  . Abdominal Pain    Shawna [name] is a 53 y.o. female.  HPI Patient presents to the emergency department with generalized abdominal pain with nausea and vomiting that is been ongoing since October.  The patient states that she has been seen at [facility] has been taking Zofran at home for her symptoms without relief.  The patient states that she cannot take Phenergan due to the fact that it makes her feel jittery.  The patient states that she has had some darker stools as well.  The patient states that she has general shakiness.  The patient denies chest pain, shortness of breath, headache,blurred vision, neck pain, fever, cough, weakness, numbness, dizziness, anorexia, edema,  diarrhea, rash, back pain, dysuria, hematemesis, bloody stool, near syncope, or syncope.    No past medical history on file.  There are no problems to display for this patient.     OB History   No obstetric history on file.     No family history on file.  Social History   Tobacco Use  . Smoking status: Not on file  Substance Use Topics  . Alcohol use: Not on file  . Drug use: Not on file    Home Medications Prior to Admission medications   Not on File    Allergies    Patient has no allergy information on record.  Review of Systems   Review of Systems All other systems negative except as documented in the HPI. All pertinent positives and negatives as reviewed in the HPI. Physical Exam Updated Vital Signs BP 117/83   Pulse 79   Temp 98.6 F (37 C) (Oral)   Resp 15   Ht 5\' 6"  (1.676 m)   Wt 76.7 kg   SpO2 95%   BMI 27.28 kg/m   Physical Exam Vitals and nursing note reviewed.  Constitutional:      General: She is not in acute distress.    Appearance: She is well-developed.  HENT:   Head: Normocephalic and atraumatic.  Eyes:     Pupils: Pupils are equal, round, and reactive to light.  Cardiovascular:     Rate and Rhythm: Normal rate and regular rhythm.     Heart sounds: Normal heart sounds. No murmur. No friction rub. No gallop.   Pulmonary:     Effort: Pulmonary effort is normal. No respiratory distress.     Breath sounds: Normal breath sounds. No wheezing.  Abdominal:     General: Bowel sounds are normal. There is no distension.     Palpations: Abdomen is soft.     Tenderness: There is generalized abdominal tenderness.  Musculoskeletal:     Cervical back: Normal range of motion and neck supple.  Skin:    General: Skin is warm and dry.     Capillary Refill: Capillary refill takes less than 2 seconds.     Findings: No erythema or rash.  Neurological:     Mental Status: She is alert and oriented to person, place, and time.     Motor: No abnormal muscle tone.     Coordination: Coordination normal.  Psychiatric:        Behavior: Behavior normal.     ED Results / Procedures / Treatments   Labs (all labs ordered are listed, but only abnormal  results are displayed) Labs Reviewed  COMPREHENSIVE METABOLIC PANEL - Abnormal; Notable for the following components:      Result Value   Glucose, Bld 139 (*)    All other components within normal limits  CBC - Abnormal; Notable for the following components:   Platelets 469 (*)    All other components within normal limits  LIPASE, BLOOD  URINALYSIS, ROUTINE W REFLEX MICROSCOPIC  I-STAT BETA HCG BLOOD, ED ([facility], [facility], [facility] ONLY)    EKG None  Radiology No results found.  Procedures Procedures (including critical care time)  Medications Ordered in ED Medications  sodium chloride flush (NS) 0.9 % injection 3 mL (0 mLs Intravenous Hold 04/18/19 1323)  morphine 4 MG/ML injection 4 mg (has no administration in time range)  morphine 4 MG/ML injection 4 mg (4 mg Intravenous Given 04/18/19 1435)  ondansetron (ZOFRAN)  injection 4 mg (4 mg Intravenous Given 04/18/19 1435)  HYDROmorphone (DILAUDID) injection 1 mg (1 mg Intravenous Given 04/18/19 1550)    ED Course  I have reviewed the triage vital signs and the nursing notes.  Pertinent labs & imaging results that were available during my care of the patient were reviewed by me and considered in my medical decision making (see chart for details).    MDM Rules/Calculators/A&P                      Patient will be CT scan and her laboratory testing is pending.  Patient has been stable here in the emergency department.  She is requesting admission so we can figure out what is going on with her. Final Clinical Impression(s) / ED Diagnoses Final diagnoses:  None    Rx / DC Orders ED Discharge Orders    None       Charlestine Night, PA-C 04/18/19 1602    Melene Plan, DO 04/18/19 1603

## 2019-04-18 NOTE — ED Notes (Signed)
Patient Alert and oriented to baseline. Stable and ambulatory to baseline. Patient verbalized understanding of the discharge instructions.  Patient belongings were taken by the patient.   

## 2019-04-19 ENCOUNTER — Ambulatory Visit: Admission: RE | Admit: 2019-04-19 | Payer: Medicaid Other | Source: Ambulatory Visit

## 2019-09-20 ENCOUNTER — Other Ambulatory Visit: Payer: Self-pay

## 2019-09-20 ENCOUNTER — Emergency Department
Admission: EM | Admit: 2019-09-20 | Discharge: 2019-09-20 | Disposition: A | Discharge status: Home / Self Care | Payer: Medicaid Other | Attending: Emergency Medicine | Admitting: Emergency Medicine

## 2019-09-20 DIAGNOSIS — Z5321 Procedure and treatment not carried out due to patient leaving prior to being seen by health care provider: Secondary | ICD-10-CM | POA: Diagnosis not present

## 2019-09-20 DIAGNOSIS — R109 Unspecified abdominal pain: Secondary | ICD-10-CM | POA: Diagnosis not present

## 2019-09-20 LAB — CBC
HCT: 40.4 % (ref 36.0–46.0)
Hemoglobin: 13.7 g/dL (ref 12.0–15.0)
MCH: 30.2 pg (ref 26.0–34.0)
MCHC: 33.9 g/dL (ref 30.0–36.0)
MCV: 89 fL (ref 80.0–100.0)
Platelets: 404 10*3/uL — ABNORMAL HIGH (ref 150–400)
RBC: 4.54 MIL/uL (ref 3.87–5.11)
RDW: 13.3 % (ref 11.5–15.5)
WBC: 14.2 10*3/uL — ABNORMAL HIGH (ref 4.0–10.5)
nRBC: 0 % (ref 0.0–0.2)

## 2019-09-20 LAB — COMPREHENSIVE METABOLIC PANEL
ALT: 15 U/L (ref 0–44)
AST: 19 U/L (ref 15–41)
Albumin: 4.1 g/dL (ref 3.5–5.0)
Alkaline Phosphatase: 94 U/L (ref 38–126)
Anion gap: 12 (ref 5–15)
BUN: 18 mg/dL (ref 6–20)
CO2: 22 mmol/L (ref 22–32)
Calcium: 9.1 mg/dL (ref 8.9–10.3)
Chloride: 104 mmol/L (ref 98–111)
Creatinine, Ser: 0.95 mg/dL (ref 0.44–1.00)
GFR calc Af Amer: >60 mL/min (ref >60)
GFR calc non Af Amer: >60 mL/min (ref >60)
Glucose, Bld: 135 mg/dL — ABNORMAL HIGH (ref 70–99)
Potassium: 3.5 mmol/L (ref 3.5–5.1)
Sodium: 138 mmol/L (ref 135–145)
Total Bilirubin: 0.6 mg/dL (ref 0.3–1.2)
Total Protein: 7.3 g/dL (ref 6.5–8.1)

## 2019-09-20 LAB — TROPONIN I (HIGH SENSITIVITY): Troponin I (High Sensitivity): 2 ng/L (ref <18)

## 2019-09-20 LAB — LIPASE, BLOOD: Lipase: 33 U/L (ref 11–51)

## 2019-09-20 NOTE — ED Triage Notes (Signed)
Pt in with co abd pain x 2 days with nausea. No vomiting or diarrhea, CBG 134 per EMS. Denies any fever, or dysuria. Pt states she has IBS, states does not feel the same.

## 2019-09-21 ENCOUNTER — Telehealth: Payer: Self-pay | Admitting: Emergency Medicine

## 2019-09-21 LAB — URINALYSIS, COMPLETE (UACMP) WITH MICROSCOPIC
Bilirubin Urine: NEGATIVE
Glucose, UA: NEGATIVE mg/dL
Hgb urine dipstick: NEGATIVE
Ketones, ur: NEGATIVE mg/dL
Nitrite: NEGATIVE
Protein, ur: 30 mg/dL — AB
Specific Gravity, Urine: 1.039 — ABNORMAL HIGH (ref 1.005–1.030)
pH: 5 (ref 5.0–8.0)

## 2019-09-21 NOTE — Telephone Encounter (Signed)
Called patient due to lwot to inquire about condition and follow up plans. Left message.   

## 2020-11-02 ENCOUNTER — Emergency Department
Admission: EM | Admit: 2020-11-02 | Discharge: 2020-11-02 | Disposition: A | Discharge status: Home / Self Care | Payer: Medicaid Other | Attending: Emergency Medicine | Admitting: Emergency Medicine

## 2020-11-02 ENCOUNTER — Other Ambulatory Visit: Payer: Self-pay

## 2020-11-02 DIAGNOSIS — Z79899 Other long term (current) drug therapy: Secondary | ICD-10-CM | POA: Diagnosis not present

## 2020-11-02 DIAGNOSIS — J45909 Unspecified asthma, uncomplicated: Secondary | ICD-10-CM | POA: Insufficient documentation

## 2020-11-02 DIAGNOSIS — J449 Chronic obstructive pulmonary disease, unspecified: Secondary | ICD-10-CM | POA: Diagnosis not present

## 2020-11-02 DIAGNOSIS — K0510 Chronic gingivitis, plaque induced: Secondary | ICD-10-CM

## 2020-11-02 DIAGNOSIS — K0889 Other specified disorders of teeth and supporting structures: Secondary | ICD-10-CM | POA: Diagnosis present

## 2020-11-02 DIAGNOSIS — K029 Dental caries, unspecified: Secondary | ICD-10-CM | POA: Diagnosis not present

## 2020-11-02 DIAGNOSIS — F17210 Nicotine dependence, cigarettes, uncomplicated: Secondary | ICD-10-CM | POA: Diagnosis not present

## 2020-11-02 MED ORDER — AMOXICILLIN 875 MG PO TABS: 875.0000 mg | ORAL_TABLET | Freq: Two times a day (BID) | ORAL | 0 refills | Status: DC

## 2020-11-02 NOTE — ED Provider Notes (Signed)
[facility] Emergency Department Provider Note  ____________________________________________   None    (approximate)  I have reviewed the triage vital signs and the nursing notes.   HISTORY  Chief Complaint Dental Pain   HPI Shawna [name] is a 55 y.o. female presents to the ED with complaint of dental pain.  Patient states that she has very few teeth remaining in the upper left portion of her mouth.  She states that pain started 2 days ago.  She reports that she has been unable to see a dentist "in a long time".         Past Medical History:  Diagnosis Date   Anxiety    Asthma    Bulging lumbar disc    Bulging lumbar disc    GERD (gastroesophageal reflux disease)    Insomnia    Kidney stone    Pneumonia DEC 2016   Sciatic leg pain    Right side   Sciatica of right side    Weakness    Bil knees, and ankles    Patient Active Problem List   Diagnosis Date Noted   Visual hallucinations 07/30/2017   Colitis 01/22/2017   Dyslipidemia 07/26/2016   Low vitamin B12 level 07/26/2016   Major depressive disorder, 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   Overdose of sedative or hypnotic 07/23/2016   COPD exacerbation (HCC) 07/23/2016   GERD (gastroesophageal reflux disease) 07/23/2016   Cannabis use disorder, moderate, 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   Tobacco use disorder 07/23/2016   Sedative, hypnotic or anxiolytic use disorder, severe, 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   Bacterial pneumonia 01/23/2015   Leukocytosis 01/23/2015   Sepsis (HCC) 01/23/2015   Hypoxemia 01/23/2015   Opioid use disorder, moderate, dependence (HCC) 12/31/2014   Anxiety disorder, unspecified 12/31/2014    Past Surgical History:  Procedure Laterality Date   ABDOMINAL HYSTERECTOMY     Partial   ESOPHAGOGASTRODUODENOSCOPY (EGD) WITH PROPOFOL N/A 11/27/2015   Procedure: ESOPHAGOGASTRODUODENOSCOPY (EGD) WITH PROPOFOL;  Surgeon: Christena Deem, MD;   Location: [facility];  Service: Endoscopy;  Laterality: N/A;   SHOULDER SURGERY Right    TONSILLECTOMY     TUBAL LIGATION      Prior to Admission medications   Medication Sig Start Date End Date Taking? Authorizing Provider  amoxicillin (AMOXIL) 875 MG tablet Take 1 tablet (875 mg total) by mouth 2 (two) times daily. 11/02/20  Yes Tommi Rumps, PA-C  acetaminophen (TYLENOL) 500 MG tablet Take 500 mg by mouth every 6 (six) hours as needed.    [provider]  albuterol (PROVENTIL HFA;VENTOLIN HFA) 108 (90 Base) MCG/ACT inhaler Inhale 2 puffs into the lungs every 6 (six) hours as needed for wheezing or shortness of breath. 01/16/16   Jennye Moccasin, MD  aluminum-magnesium hydroxide-simethicone (MAALOX) 200-200-20 MG/5ML SUSP Take 30 mLs by mouth 4 (four) times daily -  before meals and at bedtime. 02/28/19   Sharman Cheek, MD  clonazePAM (KLONOPIN) 0.5 MG tablet Take 0.5 mg by mouth 2 (two) times daily as needed for anxiety.    [provider]  dexlansoprazole (DEXILANT) 60 MG capsule Take 1 capsule by mouth daily.    [provider]  dicyclomine (BENTYL) 20 MG tablet Take 1 tablet (20 mg total) by mouth 3 (three) times daily as needed for spasms. 03/04/19 03/03/20  Triplett, Rulon Eisenmenger B, FNP  DULoxetine 40 MG CPEP Take 40 mg by mouth daily. 07/27/16   Pucilowska, Ellin Goodie, MD  famotidine (PEPCID) 20  MG tablet Take 1 tablet (20 mg total) by mouth 2 (two) times daily. 02/28/19   Sharman Cheek, MD  fexofenadine (ALLEGRA) 180 MG tablet Take 180 mg by mouth daily.    [provider]  fluticasone (FLONASE) 50 MCG/ACT nasal spray Place 2 sprays into both nostrils daily.     [provider]  hydrOXYzine (ATARAX/VISTARIL) 50 MG tablet Take 50 mg by mouth every 6 (six) hours as needed.     [provider]  lidocaine (XYLOCAINE) 2 % solution Use as directed 15 mLs in the mouth or throat as needed for mouth pain. 04/13/19   Chesley Noon, MD   methylPREDNISolone (MEDROL DOSEPAK) 4 MG TBPK tablet Take Tapered dose as directed 11/16/18   Joni Reining, PA-C  metoCLOPramide (REGLAN) 10 MG tablet Take 1 tablet (10 mg total) by mouth every 6 (six) hours as needed. 02/28/19   Sharman Cheek, MD  metoCLOPramide (REGLAN) 10 MG tablet Take 1 tablet (10 mg total) by mouth every 8 (eight) hours as needed for refractory nausea / vomiting. 04/18/19   Felicie Morn, NP  ondansetron (ZOFRAN ODT) 4 MG disintegrating tablet Take 1 tablet (4 mg total) by mouth every 8 (eight) hours as needed for nausea or vomiting. 03/11/19   Arnaldo Natal, MD  ondansetron (ZOFRAN ODT) 8 MG disintegrating tablet Take 1 tablet (8 mg total) by mouth every 8 (eight) hours as needed for nausea or vomiting. 03/08/19   Dionne Bucy, MD  ondansetron (ZOFRAN-ODT) 4 MG disintegrating tablet Take 1 tablet (4 mg total) by mouth every 8 (eight) hours as needed for nausea or vomiting. 02/03/18   Cuthriell, Delorise Royals, PA-C  predniSONE (STERAPRED UNI-PAK 21 TAB) 10 MG (21) TBPK tablet Take 6 tablets on the first day and decrease by 1 tablet each day until finished. 03/04/19   Triplett, Cari B, FNP  zolpidem (AMBIEN) 10 MG tablet Take 10 mg by mouth at bedtime as needed for sleep.    [provider]    Allergies Haldol [haloperidol], Morphine and related, and Propofol  Family History  Problem Relation Age of Onset   Fibromyalgia Mother     Social History Social History   Tobacco Use   Smoking status: Every Day    Packs/day: 1.00    Years: 40.00    Pack years: 40.00    Types: Cigarettes   Smokeless tobacco: Current  Substance Use Topics   Alcohol use: Yes    Comment: occ   Drug use: No    Review of Systems Constitutional: No fever/chills Eyes: No visual changes. ENT: Positive for dental pain. Cardiovascular: Denies chest pain. Respiratory: Denies shortness of breath. Musculoskeletal: Negative for musculoskeletal pain. Skin: Negative for  rash. Neurological: Negative for headaches, focal weakness or numbness.  ____________________________________________   PHYSICAL EXAM:  VITAL SIGNS: ED Triage Vitals  Enc Vitals Group     BP 11/02/20 0546 106/84     Pulse Rate 11/02/20 0546 96     Resp 11/02/20 0546 16     Temp 11/02/20 0546 98.3 F (36.8 C)     Temp Source 11/02/20 0546 Oral     SpO2 11/02/20 0546 100 %     Weight 11/02/20 0547 160 lb (72.6 kg)     Height 11/02/20 0547 5\' 6"  (1.676 m)     Head Circumference --      Peak Flow --      Pain Score 11/02/20 0547 8     Pain Loc --  Pain Edu? --      Excl. in GC? --     Constitutional: Alert and oriented. Well appearing and in no acute distress. Eyes: Conjunctivae are normal.  Head: Atraumatic. Nose: No congestion/rhinnorhea. Mouth/Throat: Teeth are in extremely poor repair and hygiene.  Gums are mildly edematous but no actual drainage is present.  Most teeth are decayed down to the level of the gumline. Neck: No stridor.   Cardiovascular: Normal rate, regular rhythm. Grossly normal heart sounds.  Good peripheral circulation. Respiratory: Normal respiratory effort.  No retractions. Lungs CTAB. Musculoskeletal: Moves upper and lower extremities they have difficulty.  Normal gait was noted. Neurologic:  Normal speech and language. No gross focal neurologic deficits are appreciated.  Skin:  Skin is warm, dry and intact. No rash noted. Psychiatric: Mood and affect are normal. Speech and behavior are normal.  ____________________________________________   LABS (all labs ordered are listed, but only abnormal results are displayed)  Labs Reviewed - No data to display ____________________________________________   PROCEDURES  Procedure(s) performed (including Critical Care):  Procedures   ____________________________________________   INITIAL IMPRESSION / ASSESSMENT AND PLAN / ED COURSE  As part of my medical decision making, I reviewed the  following data within the electronic MEDICAL RECORD NUMBER Notes from prior ED visits and [place] Controlled Substance Database  55 year old female presents to the ED with complaint of dental pain for the last 2 days.  Patient reports that has been "long time" since her last dental visit.  Physical exam reveals very poor dentition and hygiene.  Multiple dental caries are present.  Patient was given list of dental clinics in the area with most based on income for dental fee.  Patient was made aware.  A prescription for amoxicillin 875 was sent to the pharmacy for her to begin taking.  Strongly encouraged to follow-up with one of the dental clinics.  ____________________________________________   FINAL CLINICAL IMPRESSION(S) / ED DIAGNOSES  Final diagnoses:  Pain due to dental caries  Gingivitis     ED Discharge Orders          Ordered    amoxicillin (AMOXIL) 875 MG tablet  2 times daily        11/02/20 0724             Note:  This document was prepared using Dragon voice recognition software and may include unintentional dictation errors.    Tommi Rumps, PA-C 11/02/20 9937    Merwyn Katos, MD 11/03/20 [phone]

## 2020-11-02 NOTE — ED Notes (Signed)
Pt states left sided dental pain for 2 days. Has a lot of missing teeth per pt.

## 2020-11-02 NOTE — Discharge Instructions (Addendum)
Begin taking antibiotic until completely finished.  You may alternate between Tylenol and ibuprofen as needed for pain.  A list of dental clinics is listed on your discharge papers.  OPTIONS FOR DENTAL FOLLOW UP CARE  [place] Department of Health and Human Services - Local Safety Net Dental Clinics [email or web address]   [facility] [phone])  [facility] [phone])  [place] [phone] ext [phone])  [facility] [phone])  [facility] [phone]) This clinic caters to the indigent population and is on a lottery system. Location: [employer] [facility], [employer], [address], [place] Clinic Hours: Wednesdays from 6pm - 9pm, patients seen by a lottery system. For dates, call or go to [email or web address] Services: Cleanings, fillings and simple extractions. Payment Options: DENTAL WORK IS FREE OF CHARGE. Bring proof of income or support. Best way to get seen: Arrive at 5:15 pm - this is a lottery, NOT first come/first serve, so arriving earlier will not increase your chances of being seen.     [facility] [phone] Select option 1 for emergencies   Location: [facility], [place], [address] [place] Clinic Hours: No walk-ins accepted - call the day before to schedule an appointment. Check in times are 9:30 am and 1:30 pm. Services: Simple extractions, temporary fillings, pulpectomy/pulp debridement, uncomplicated abscess drainage. Payment Options: PAYMENT IS DUE AT THE TIME OF SERVICE.  Fee is usually $100-200, additional surgical procedures (e.g. abscess drainage) may be extra. Cash, checks, Visa/MasterCard accepted.  Can file Medicaid if patient is covered for dental - patient should call case worker to check. No discount for [facility] patients. Best way to  get seen: MUST call the day before and get onto the schedule. Can usually be seen the next 1-2 days. No walk-ins accepted.     [facility] [phone]   Location: [facility], [address], [place] Clinic Hours: M, W, Th, F 8am or 1:30pm, Tues 9a or 1:30 - first come/first served. Services: Simple extractions, temporary fillings, uncomplicated abscess drainage.  You do not need to be an [facility] resident. Payment Options: PAYMENT IS DUE AT THE TIME OF SERVICE. Dental insurance, otherwise sliding scale - bring proof of income or support. Depending on income and treatment needed, cost is usually $50-200. Best way to get seen: Arrive early as it is first come/first served.     [facility] [phone]   Location: [address] Clinic Hours: Mon-Thu 8a-5p Services: Most basic dental services including extractions and fillings. Payment Options: PAYMENT IS DUE AT THE TIME OF SERVICE. Sliding scale, up to 50% off - bring proof if income or support. Medicaid with dental option accepted. Best way to get seen: Call to schedule an appointment, can usually be seen within 2 weeks OR they will try to see walk-ins - show up at 8a or 2p (you may have to wait).     [facility] [phone] [place] RESIDENTS ONLY   Location: [facility], [address] [place], [place] [address] Clinic Hours: By appointment only. Monday - Thursday 8am-5pm, Friday 8am-12pm Services: Cleanings, fillings, extractions. Payment Options: PAYMENT IS DUE AT THE TIME OF SERVICE. Cash, Visa or MasterCard. Sliding scale - $30 minimum per service. Best way to get seen: Come in to office, complete packet and make an appointment - need proof of income or support monies for each household  member and proof of [facility] residence. Usually takes about a month to get in.     [facility] [phone]   Location: [address] [facility] Hours: Walk-in Urgent Care Dental Services are offered Monday-Friday mornings only. The numbers of emergencies accepted daily is limited to the number of providers available. Maximum 15 - Mondays, Wednesdays & Thursdays Maximum 10 - Tuesdays & Fridays Services: You do not need to be a [facility] resident to be seen for a dental emergency. Emergencies are defined as pain, swelling, abnormal bleeding, or dental trauma. Walkins will receive x-rays if needed. NOTE: Dental cleaning is not an emergency. Payment Options: PAYMENT IS DUE AT THE TIME OF SERVICE. Minimum co-pay is $40.00 for uninsured patients. Minimum co-pay is $3.00 for Medicaid with dental coverage. Dental Insurance is accepted and must be presented at time of visit. Medicare does not cover dental. Forms of payment: Cash, credit card, checks. Best way to get seen: If not previously registered with the clinic, walk-in dental registration begins at 7:15 am and is on a first come/first serve basis. If previously registered with the clinic, call to make an appointment.     [facility] [phone] [place] RESIDENTS ONLY   Location: [address] [place], [place] Clinic Hours: Mon-Thu 10a-2p Services: Extractions only! Payment Options: FREE (donations accepted) - bring proof of income or support Best way to get seen: Call and schedule an appointment OR come at 8am on the 1st Monday of every month (except for holidays) when it is first come/first served.     [facility] [phone]   Location: [address] [place], [place] Clinic Hours: Friday mornings Services, Payment Options, Best way to get seen: Call for info

## 2020-11-02 NOTE — ED Triage Notes (Signed)
Pt states dental pain, complains of front tooth and "the only one on the left side" of upper jaw. Pt states has been painful for several days.

## 2021-01-02 ENCOUNTER — Emergency Department
Admission: EM | Admit: 2021-01-02 | Discharge: 2021-01-02 | Disposition: A | Discharge status: Home / Self Care | Payer: Medicaid Other | Attending: Emergency Medicine | Admitting: Emergency Medicine

## 2021-01-02 ENCOUNTER — Other Ambulatory Visit: Payer: Self-pay

## 2021-01-02 DIAGNOSIS — K0889 Other specified disorders of teeth and supporting structures: Secondary | ICD-10-CM | POA: Diagnosis present

## 2021-01-02 DIAGNOSIS — Z7951 Long term (current) use of inhaled steroids: Secondary | ICD-10-CM | POA: Diagnosis not present

## 2021-01-02 DIAGNOSIS — F17210 Nicotine dependence, cigarettes, uncomplicated: Secondary | ICD-10-CM | POA: Insufficient documentation

## 2021-01-02 DIAGNOSIS — J45909 Unspecified asthma, uncomplicated: Secondary | ICD-10-CM | POA: Diagnosis not present

## 2021-01-02 DIAGNOSIS — J441 Chronic obstructive pulmonary disease with (acute) exacerbation: Secondary | ICD-10-CM | POA: Diagnosis not present

## 2021-01-02 MED ORDER — KETOROLAC TROMETHAMINE 30 MG/ML IJ SOLN
30.0000 mg | Freq: Once | INTRAMUSCULAR | Status: AC
  Administered 2021-01-02: 30 mg via INTRAMUSCULAR
  Filled 2021-01-02: qty 1

## 2021-01-02 NOTE — ED Notes (Signed)
First nurse-pt brought in via ems from home with dental pain.  Pt has upper teeth removed 8/31.  Pt continues to have pain.  Pt taking otc meds without relief

## 2021-01-02 NOTE — ED Provider Notes (Signed)
[facility]-EMERGENCY DEPARTMENT  ____________________________________________  Time seen: Approximately 6:36 PM  I have reviewed the triage vital signs and the nursing notes.   HISTORY  Chief Complaint Dental Pain   Historian Patient     HPI Shawna [name] is a 55 y.o. female presents to the emergency department with concern for dental pain.  Patient recently had all of her teeth extracted and states that she has been taking Tylenol and ibuprofen with little relief.  No chest pain, chest tightness or abdominal pain.  No other alleviating measures have been attempted.   Past Medical History:  Diagnosis Date   Anxiety    Asthma    Bulging lumbar disc    Bulging lumbar disc    GERD (gastroesophageal reflux disease)    Insomnia    Kidney stone    Pneumonia DEC 2016   Sciatic leg pain    Right side   Sciatica of right side    Weakness    Bil knees, and ankles     Immunizations up to date:  Yes.     Past Medical History:  Diagnosis Date   Anxiety    Asthma    Bulging lumbar disc    Bulging lumbar disc    GERD (gastroesophageal reflux disease)    Insomnia    Kidney stone    Pneumonia DEC 2016   Sciatic leg pain    Right side   Sciatica of right side    Weakness    Bil knees, and ankles    Patient Active Problem List   Diagnosis Date Noted   Visual hallucinations 07/30/2017   Colitis 01/22/2017   Dyslipidemia 07/26/2016   Low vitamin B12 level 07/26/2016   Major depressive disorder, 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   Overdose of sedative or hypnotic 07/23/2016   COPD exacerbation (HCC) 07/23/2016   GERD (gastroesophageal reflux disease) 07/23/2016   Cannabis use disorder, moderate, 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   Tobacco use disorder 07/23/2016   Sedative, hypnotic or anxiolytic use disorder, severe, 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   Bacterial pneumonia 01/23/2015   Leukocytosis 01/23/2015   Sepsis (HCC) 01/23/2015   Hypoxemia  01/23/2015   Opioid use disorder, moderate, dependence (HCC) 12/31/2014   Anxiety disorder, unspecified 12/31/2014    Past Surgical History:  Procedure Laterality Date   ABDOMINAL HYSTERECTOMY     Partial   ESOPHAGOGASTRODUODENOSCOPY (EGD) WITH PROPOFOL N/A 11/27/2015   Procedure: ESOPHAGOGASTRODUODENOSCOPY (EGD) WITH PROPOFOL;  Surgeon: Christena Deem, MD;  Location: [facility] ENDOSCOPY;  Service: Endoscopy;  Laterality: N/A;   SHOULDER SURGERY Right    TONSILLECTOMY     TUBAL LIGATION      Prior to Admission medications   Medication Sig Start Date End Date Taking? Authorizing Provider  acetaminophen (TYLENOL) 500 MG tablet Take 500 mg by mouth every 6 (six) hours as needed.    [provider]  albuterol (PROVENTIL HFA;VENTOLIN HFA) 108 (90 Base) MCG/ACT inhaler Inhale 2 puffs into the lungs every 6 (six) hours as needed for wheezing or shortness of breath. 01/16/16   Jennye Moccasin, MD  aluminum-magnesium hydroxide-simethicone (MAALOX) 200-200-20 MG/5ML SUSP Take 30 mLs by mouth 4 (four) times daily -  before meals and at bedtime. 02/28/19   Sharman Cheek, MD  amoxicillin (AMOXIL) 875 MG tablet Take 1 tablet (875 mg total) by mouth 2 (two) times daily. 11/02/20   Tommi Rumps, PA-C  clonazePAM (KLONOPIN) 0.5 MG tablet Take 0.5 mg by mouth 2 (two) times daily as needed  for anxiety.    [provider]  dexlansoprazole (DEXILANT) 60 MG capsule Take 1 capsule by mouth daily.    [provider]  dicyclomine (BENTYL) 20 MG tablet Take 1 tablet (20 mg total) by mouth 3 (three) times daily as needed for spasms. 03/04/19 03/03/20  Triplett, Rulon Eisenmenger B, FNP  DULoxetine 40 MG CPEP Take 40 mg by mouth daily. 07/27/16   Pucilowska, Braulio Conte B, MD  famotidine (PEPCID) 20 MG tablet Take 1 tablet (20 mg total) by mouth 2 (two) times daily. 02/28/19   Sharman Cheek, MD  fexofenadine (ALLEGRA) 180 MG tablet Take 180 mg by mouth daily.    [provider]  fluticasone  (FLONASE) 50 MCG/ACT nasal spray Place 2 sprays into both nostrils daily.     [provider]  hydrOXYzine (ATARAX/VISTARIL) 50 MG tablet Take 50 mg by mouth every 6 (six) hours as needed.     [provider]  lidocaine (XYLOCAINE) 2 % solution Use as directed 15 mLs in the mouth or throat as needed for mouth pain. 04/13/19   Chesley Noon, MD  methylPREDNISolone (MEDROL DOSEPAK) 4 MG TBPK tablet Take Tapered dose as directed 11/16/18   Joni Reining, PA-C  metoCLOPramide (REGLAN) 10 MG tablet Take 1 tablet (10 mg total) by mouth every 6 (six) hours as needed. 02/28/19   Sharman Cheek, MD  metoCLOPramide (REGLAN) 10 MG tablet Take 1 tablet (10 mg total) by mouth every 8 (eight) hours as needed for refractory nausea / vomiting. 04/18/19   Felicie Morn, NP  ondansetron (ZOFRAN ODT) 4 MG disintegrating tablet Take 1 tablet (4 mg total) by mouth every 8 (eight) hours as needed for nausea or vomiting. 03/11/19   Arnaldo Natal, MD  ondansetron (ZOFRAN ODT) 8 MG disintegrating tablet Take 1 tablet (8 mg total) by mouth every 8 (eight) hours as needed for nausea or vomiting. 03/08/19   Dionne Bucy, MD  ondansetron (ZOFRAN-ODT) 4 MG disintegrating tablet Take 1 tablet (4 mg total) by mouth every 8 (eight) hours as needed for nausea or vomiting. 02/03/18   Cuthriell, Delorise Royals, PA-C  predniSONE (STERAPRED UNI-PAK 21 TAB) 10 MG (21) TBPK tablet Take 6 tablets on the first day and decrease by 1 tablet each day until finished. 03/04/19   Triplett, Cari B, FNP  zolpidem (AMBIEN) 10 MG tablet Take 10 mg by mouth at bedtime as needed for sleep.    [provider]    Allergies Haldol [haloperidol], Morphine and related, and Propofol  Family History  Problem Relation Age of Onset   Fibromyalgia Mother     Social History Social History   Tobacco Use   Smoking status: Every Day    Packs/day: 1.00    Years: 40.00    Pack years: 40.00    Types: Cigarettes    Smokeless tobacco: Current  Substance Use Topics   Alcohol use: Yes    Comment: occ   Drug use: No     Review of Systems  Constitutional: No fever/chills Eyes:  No discharge ENT: Patient has dental pain.  Respiratory: no cough. No SOB/ use of accessory muscles to breath Gastrointestinal:   No nausea, no vomiting.  No diarrhea.  No constipation. Musculoskeletal: Negative for musculoskeletal pain. Skin: Negative for rash, abrasions, lacerations, ecchymosis.  ____________________________________________   PHYSICAL EXAM:  VITAL SIGNS: ED Triage Vitals  Enc Vitals Group     BP 01/02/21 1618 (!) 113/95     Pulse Rate 01/02/21 1618 99  Resp 01/02/21 1618 20     Temp 01/02/21 1618 98.7 F (37.1 C)     Temp Source 01/02/21 1618 Oral     SpO2 01/02/21 1618 92 %     Weight 01/02/21 1619 168 lb (76.2 kg)     Height 01/02/21 1619 5\' 6"  (1.676 m)     Head Circumference --      Peak Flow --      Pain Score 01/02/21 1619 9     Pain Loc --      Pain Edu? --      Excl. in GC? --      Constitutional: Alert and oriented. Well appearing and in no acute distress. Eyes: Conjunctivae are normal. PERRL. EOMI. Head: Atraumatic. ENT:      Ears:       Nose: No congestion/rhinnorhea.      Mouth/Throat: Mucous membranes are moist.  Extraction sites visualized with no evidence of gingival hypertrophy.  No swelling of the upper and lower jaw. Neck: No stridor.  No cervical spine tenderness to palpation. Cardiovascular: Normal rate, regular rhythm. Normal S1 and S2.  Good peripheral circulation. Respiratory: Normal respiratory effort without tachypnea or retractions. Lungs CTAB. Good air entry to the bases with no decreased or absent breath sounds Gastrointestinal: Bowel sounds x 4 quadrants. Soft and nontender to palpation. No guarding or rigidity. No distention. Musculoskeletal: Full range of motion to all extremities. No obvious deformities noted Neurologic:  Normal for age. No gross  focal neurologic deficits are appreciated.  Skin:  Skin is warm, dry and intact. No rash noted. Psychiatric: Mood and affect are normal for age. Speech and behavior are normal.   ____________________________________________   LABS (all labs ordered are listed, but only abnormal results are displayed)  Labs Reviewed - No data to display ____________________________________________  EKG   ____________________________________________  RADIOLOGY   No results found.  ____________________________________________    PROCEDURES  Procedure(s) performed:     Procedures     Medications  ketorolac (TORADOL) 30 MG/ML injection 30 mg (30 mg Intramuscular Given 01/02/21 1728)     ____________________________________________   INITIAL IMPRESSION / ASSESSMENT AND PLAN / ED COURSE  Pertinent labs & imaging results that were available during my care of the patient were reviewed by me and considered in my medical decision making (see chart for details).      Assessment and Plan:  Dental pain 55 year old female presents to the emergency department with dental pain since having multiple teeth extracted.  Offered patient an injection of Toradol and discharge patient with oral Zofran as she endorsed nausea.  She was advised to follow-up with her dentist.     ____________________________________________  FINAL CLINICAL IMPRESSION(S) / ED DIAGNOSES  Final diagnoses:  Pain, dental      NEW MEDICATIONS STARTED DURING THIS VISIT:  ED Discharge Orders     None           This chart was dictated using voice recognition software/Dragon. Despite best efforts to proofread, errors can occur which can change the meaning. Any change was purely unintentional.     40, PA-C 01/02/21 2228    2229, MD 01/03/21 0030

## 2021-01-02 NOTE — ED Triage Notes (Signed)
Pt to ED EMS from home for dental pain since 8/31. States had upper teeth removed, was not given pain meds. Was told to alternate tylenol and ibuprofen. Having trouble eating

## 2021-01-03 ENCOUNTER — Other Ambulatory Visit: Payer: Self-pay

## 2021-01-03 ENCOUNTER — Emergency Department
Admission: EM | Admit: 2021-01-03 | Discharge: 2021-01-03 | Disposition: A | Discharge status: Home / Self Care | Payer: Medicaid Other | Attending: Emergency Medicine | Admitting: Emergency Medicine

## 2021-01-03 ENCOUNTER — Emergency Department: Payer: Medicaid Other

## 2021-01-03 DIAGNOSIS — K047 Periapical abscess without sinus: Secondary | ICD-10-CM | POA: Insufficient documentation

## 2021-01-03 DIAGNOSIS — F17210 Nicotine dependence, cigarettes, uncomplicated: Secondary | ICD-10-CM | POA: Diagnosis not present

## 2021-01-03 DIAGNOSIS — K0889 Other specified disorders of teeth and supporting structures: Secondary | ICD-10-CM | POA: Diagnosis present

## 2021-01-03 DIAGNOSIS — J449 Chronic obstructive pulmonary disease, unspecified: Secondary | ICD-10-CM | POA: Insufficient documentation

## 2021-01-03 DIAGNOSIS — Z7952 Long term (current) use of systemic steroids: Secondary | ICD-10-CM | POA: Insufficient documentation

## 2021-01-03 DIAGNOSIS — A419 Sepsis, unspecified organism: Secondary | ICD-10-CM | POA: Insufficient documentation

## 2021-01-03 DIAGNOSIS — Z20822 Contact with and (suspected) exposure to covid-19: Secondary | ICD-10-CM | POA: Insufficient documentation

## 2021-01-03 DIAGNOSIS — R531 Weakness: Secondary | ICD-10-CM | POA: Insufficient documentation

## 2021-01-03 DIAGNOSIS — J45909 Unspecified asthma, uncomplicated: Secondary | ICD-10-CM | POA: Insufficient documentation

## 2021-01-03 LAB — CBC
HCT: 38.4 % (ref 36.0–46.0)
Hemoglobin: 13.8 g/dL (ref 12.0–15.0)
MCH: 31.8 pg (ref 26.0–34.0)
MCHC: 35.9 g/dL (ref 30.0–36.0)
MCV: 88.5 fL (ref 80.0–100.0)
Platelets: 487 10*3/uL — ABNORMAL HIGH (ref 150–400)
RBC: 4.34 MIL/uL (ref 3.87–5.11)
RDW: 12.6 % (ref 11.5–15.5)
WBC: 14.8 10*3/uL — ABNORMAL HIGH (ref 4.0–10.5)
nRBC: 0 % (ref 0.0–0.2)

## 2021-01-03 LAB — LACTIC ACID, PLASMA: Lactic Acid, Venous: 0.8 mmol/L (ref 0.5–1.9)

## 2021-01-03 LAB — URINALYSIS, COMPLETE (UACMP) WITH MICROSCOPIC
Bacteria, UA: NONE SEEN
Bilirubin Urine: NEGATIVE
Glucose, UA: NEGATIVE mg/dL
Ketones, ur: NEGATIVE mg/dL
Leukocytes,Ua: NEGATIVE
Nitrite: NEGATIVE
Protein, ur: NEGATIVE mg/dL
Specific Gravity, Urine: 1.015 (ref 1.005–1.030)
pH: 6 (ref 5.0–8.0)

## 2021-01-03 LAB — RESP PANEL BY RT-PCR (FLU A&B, COVID) ARPGX2
Influenza A by PCR: NEGATIVE
Influenza B by PCR: NEGATIVE
SARS Coronavirus 2 by RT PCR: NEGATIVE

## 2021-01-03 LAB — HEPATIC FUNCTION PANEL
ALT: 17 U/L (ref 0–44)
AST: 16 U/L (ref 15–41)
Albumin: 4 g/dL (ref 3.5–5.0)
Alkaline Phosphatase: 76 U/L (ref 38–126)
Bilirubin, Direct: <0.1 mg/dL (ref 0.0–0.2)
Total Bilirubin: 0.6 mg/dL (ref 0.3–1.2)
Total Protein: 7.5 g/dL (ref 6.5–8.1)

## 2021-01-03 LAB — PROTIME-INR
INR: 1 (ref 0.8–1.2)
Prothrombin Time: 13.6 seconds (ref 11.4–15.2)

## 2021-01-03 LAB — BASIC METABOLIC PANEL
Anion gap: 11 (ref 5–15)
BUN: 13 mg/dL (ref 6–20)
CO2: 23 mmol/L (ref 22–32)
Calcium: 9.3 mg/dL (ref 8.9–10.3)
Chloride: 102 mmol/L (ref 98–111)
Creatinine, Ser: 0.94 mg/dL (ref 0.44–1.00)
GFR, Estimated: >60 mL/min (ref >60)
Glucose, Bld: 192 mg/dL — ABNORMAL HIGH (ref 70–99)
Potassium: 3.9 mmol/L (ref 3.5–5.1)
Sodium: 136 mmol/L (ref 135–145)

## 2021-01-03 LAB — APTT: aPTT: 27 seconds (ref 24–36)

## 2021-01-03 MED ORDER — IBUPROFEN 600 MG PO TABS
600.0000 mg | ORAL_TABLET | Freq: Once | ORAL | Status: AC
  Administered 2021-01-03: 600 mg via ORAL
  Filled 2021-01-03: qty 1

## 2021-01-03 MED ORDER — METRONIDAZOLE 500 MG/100ML IV SOLN
500.0000 mg | Freq: Once | INTRAVENOUS | Status: AC
  Administered 2021-01-03: 500 mg via INTRAVENOUS
  Filled 2021-01-03: qty 100

## 2021-01-03 MED ORDER — VANCOMYCIN HCL 1750 MG/350ML IV SOLN
1750.0000 mg | Freq: Once | INTRAVENOUS | Status: AC
  Administered 2021-01-03: 1750 mg via INTRAVENOUS
  Filled 2021-01-03: qty 350

## 2021-01-03 MED ORDER — ONDANSETRON HCL 4 MG/2ML IJ SOLN
4.0000 mg | Freq: Once | INTRAMUSCULAR | Status: AC
  Administered 2021-01-03: 4 mg via INTRAVENOUS
  Filled 2021-01-03: qty 2

## 2021-01-03 MED ORDER — LACTATED RINGERS IV BOLUS (SEPSIS)
1000.0000 mL | Freq: Once | INTRAVENOUS | Status: AC
  Administered 2021-01-03: 1000 mL via INTRAVENOUS

## 2021-01-03 MED ORDER — CLINDAMYCIN HCL 300 MG PO CAPS: 300.0000 mg | ORAL_CAPSULE | Freq: Three times a day (TID) | ORAL | 0 refills | Status: AC

## 2021-01-03 MED ORDER — LACTATED RINGERS IV BOLUS (SEPSIS)
500.0000 mL | Freq: Once | INTRAVENOUS | Status: AC
  Administered 2021-01-03: 500 mL via INTRAVENOUS

## 2021-01-03 MED ORDER — KETOROLAC TROMETHAMINE 30 MG/ML IJ SOLN
30.0000 mg | Freq: Once | INTRAMUSCULAR | Status: AC
  Administered 2021-01-03: 30 mg via INTRAVENOUS
  Filled 2021-01-03: qty 1

## 2021-01-03 MED ORDER — PANTOPRAZOLE SODIUM 40 MG PO TBEC
80.0000 mg | DELAYED_RELEASE_TABLET | Freq: Every day | ORAL | Status: DC
  Administered 2021-01-03: 80 mg via ORAL
  Filled 2021-01-03: qty 2

## 2021-01-03 MED ORDER — VANCOMYCIN HCL IN DEXTROSE 1-5 GM/200ML-% IV SOLN: 1000.0000 mg | Freq: Once | INTRAVENOUS | Status: DC

## 2021-01-03 MED ORDER — FAMOTIDINE 20 MG IN NS 100 ML IVPB
20.0000 mg | Freq: Once | INTRAVENOUS | Status: AC
  Administered 2021-01-03: 20 mg via INTRAVENOUS
  Filled 2021-01-03: qty 100

## 2021-01-03 MED ORDER — SODIUM CHLORIDE 0.9 % IV SOLN
2.0000 g | Freq: Once | INTRAVENOUS | Status: AC
  Administered 2021-01-03: 2 g via INTRAVENOUS
  Filled 2021-01-03: qty 2

## 2021-01-03 NOTE — ED Provider Notes (Signed)
[facility] Emergency Department Provider Note   ____________________________________________   Event Date/Time   First MD Initiated Contact with Patient 01/03/21 1716     (approximate)  I have reviewed the triage vital signs and the nursing notes.   HISTORY  Chief Complaint Emesis    HPI Shawna [name] is a 55 y.o. female who presents for dental pain status post tooth extraction  LOCATION: Maxillary gumline DURATION: 4 days prior to arrival TIMING: Worsening since onset SEVERITY: Severe QUALITY: Aching CONTEXT: Patient had maxillary tooth extraction in and since has had worsening pain to this region as well as purulent drainage MODIFYING FACTORS: Any p.o. intake that requires chewing worsens this pain and denies any relieving factors ASSOCIATED SYMPTOMS: Purulent drainage from her surgical site, lightheadedness, vomiting   Per medical record review, patient history noncontributory          Past Medical History:  Diagnosis Date   Anxiety    Asthma    Bulging lumbar disc    Bulging lumbar disc    GERD (gastroesophageal reflux disease)    Insomnia    Kidney stone    Pneumonia DEC 2016   Sciatic leg pain    Right side   Sciatica of right side    Weakness    Bil knees, and ankles    Patient Active Problem List   Diagnosis Date Noted   Visual hallucinations 07/30/2017   Colitis 01/22/2017   Dyslipidemia 07/26/2016   Low vitamin B12 level 07/26/2016   Major depressive disorder, 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   Overdose of sedative or hypnotic 07/23/2016   COPD exacerbation (HCC) 07/23/2016   GERD (gastroesophageal reflux disease) 07/23/2016   Cannabis use disorder, moderate, 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   Tobacco use disorder 07/23/2016   Sedative, hypnotic or anxiolytic use disorder, severe, 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   Bacterial pneumonia 01/23/2015   Leukocytosis 01/23/2015   Sepsis (HCC)  01/23/2015   Hypoxemia 01/23/2015   Opioid use disorder, moderate, dependence (HCC) 12/31/2014   Anxiety disorder, unspecified 12/31/2014    Past Surgical History:  Procedure Laterality Date   ABDOMINAL HYSTERECTOMY     Partial   ESOPHAGOGASTRODUODENOSCOPY (EGD) WITH PROPOFOL N/A 11/27/2015   Procedure: ESOPHAGOGASTRODUODENOSCOPY (EGD) WITH PROPOFOL;  Surgeon: Christena Deem, MD;  Location: [facility] ENDOSCOPY;  Service: Endoscopy;  Laterality: N/A;   SHOULDER SURGERY Right    TONSILLECTOMY     TUBAL LIGATION      Prior to Admission medications   Medication Sig Start Date End Date Taking? Authorizing Provider  clindamycin (CLEOCIN) 300 MG capsule Take 1 capsule (300 mg total) by mouth 3 (three) times daily for 10 days. 01/03/21 01/13/21 Yes Merwyn Katos, MD  acetaminophen (TYLENOL) 500 MG tablet Take 500 mg by mouth every 6 (six) hours as needed.    [provider]  albuterol (PROVENTIL HFA;VENTOLIN HFA) 108 (90 Base) MCG/ACT inhaler Inhale 2 puffs into the lungs every 6 (six) hours as needed for wheezing or shortness of breath. 01/16/16   Jennye Moccasin, MD  aluminum-magnesium hydroxide-simethicone (MAALOX) 200-200-20 MG/5ML SUSP Take 30 mLs by mouth 4 (four) times daily -  before meals and at bedtime. 02/28/19   Sharman Cheek, MD  amoxicillin (AMOXIL) 875 MG tablet Take 1 tablet (875 mg total) by mouth 2 (two) times daily. 11/02/20   Tommi Rumps, PA-C  clonazePAM (KLONOPIN) 0.5 MG tablet Take 0.5 mg by mouth 2 (two) times daily as needed for anxiety.  [provider]  dexlansoprazole (DEXILANT) 60 MG capsule Take 1 capsule by mouth daily.    [provider]  dicyclomine (BENTYL) 20 MG tablet Take 1 tablet (20 mg total) by mouth 3 (three) times daily as needed for spasms. 03/04/19 03/03/20  Triplett, Rulon Eisenmenger B, FNP  DULoxetine 40 MG CPEP Take 40 mg by mouth daily. 07/27/16   Pucilowska, Braulio Conte B, MD  famotidine (PEPCID) 20 MG tablet Take 1 tablet (20 mg total)  by mouth 2 (two) times daily. 02/28/19   Sharman Cheek, MD  fexofenadine (ALLEGRA) 180 MG tablet Take 180 mg by mouth daily.    [provider]  fluticasone (FLONASE) 50 MCG/ACT nasal spray Place 2 sprays into both nostrils daily.     [provider]  hydrOXYzine (ATARAX/VISTARIL) 50 MG tablet Take 50 mg by mouth every 6 (six) hours as needed.     [provider]  lidocaine (XYLOCAINE) 2 % solution Use as directed 15 mLs in the mouth or throat as needed for mouth pain. 04/13/19   Chesley Noon, MD  methylPREDNISolone (MEDROL DOSEPAK) 4 MG TBPK tablet Take Tapered dose as directed 11/16/18   Joni Reining, PA-C  metoCLOPramide (REGLAN) 10 MG tablet Take 1 tablet (10 mg total) by mouth every 6 (six) hours as needed. 02/28/19   Sharman Cheek, MD  metoCLOPramide (REGLAN) 10 MG tablet Take 1 tablet (10 mg total) by mouth every 8 (eight) hours as needed for refractory nausea / vomiting. 04/18/19   Felicie Morn, NP  ondansetron (ZOFRAN ODT) 4 MG disintegrating tablet Take 1 tablet (4 mg total) by mouth every 8 (eight) hours as needed for nausea or vomiting. 03/11/19   Arnaldo Natal, MD  ondansetron (ZOFRAN ODT) 8 MG disintegrating tablet Take 1 tablet (8 mg total) by mouth every 8 (eight) hours as needed for nausea or vomiting. 03/08/19   Dionne Bucy, MD  ondansetron (ZOFRAN-ODT) 4 MG disintegrating tablet Take 1 tablet (4 mg total) by mouth every 8 (eight) hours as needed for nausea or vomiting. 02/03/18   Cuthriell, Delorise Royals, PA-C  predniSONE (STERAPRED UNI-PAK 21 TAB) 10 MG (21) TBPK tablet Take 6 tablets on the first day and decrease by 1 tablet each day until finished. 03/04/19   Triplett, Cari B, FNP  zolpidem (AMBIEN) 10 MG tablet Take 10 mg by mouth at bedtime as needed for sleep.    [provider]    Allergies Haldol [haloperidol], Morphine and related, and Propofol  Family History  Problem Relation Age of Onset   Fibromyalgia Mother      Social History Social History   Tobacco Use   Smoking status: Every Day    Packs/day: 1.00    Years: 40.00    Pack years: 40.00    Types: Cigarettes   Smokeless tobacco: Current  Substance Use Topics   Alcohol use: Yes    Comment: occ   Drug use: No    Review of Systems Constitutional: No fever/chills Eyes: No visual changes. ENT: No sore throat.  Pain to maxillary gumline with purulent drainage Cardiovascular: Denies chest pain. Respiratory: Denies shortness of breath. Gastrointestinal: No abdominal pain.  Endorses nausea and vomiting.  No diarrhea. Genitourinary: Negative for dysuria. Musculoskeletal: Negative for acute arthralgias Skin: Negative for rash. Neurological: Negative for headaches, weakness/numbness/paresthesias in any extremity Psychiatric: Negative for suicidal ideation/homicidal ideation   ____________________________________________   PHYSICAL EXAM:  VITAL SIGNS: ED Triage Vitals  Enc Vitals Group     BP 01/03/21 1730 Marland Kitchen)  126/105     Pulse Rate 01/03/21 1730 (!) 112     Resp 01/03/21 1730 11     Temp 01/03/21 1800 98.5 F (36.9 C)     Temp Source 01/03/21 1800 Oral     SpO2 01/03/21 1730 91 %     Weight --      Height --      Head Circumference --      Peak Flow --      Pain Score 01/03/21 1631 7     Pain Loc --      Pain Edu? --      Excl. in GC? --    Constitutional: Alert and oriented. Well appearing and in no acute distress. Eyes: Conjunctivae are normal. PERRL. Head: Atraumatic. Nose: No congestion/rhinnorhea. Mouth/Throat: Mucous membranes are moist.  Edentulous maxillary gumline with small amount of purulent discharge coming from the incisor area Neck: No stridor Cardiovascular: Grossly normal heart sounds.  Good peripheral circulation. Respiratory: Normal respiratory effort.  No retractions. Gastrointestinal: Soft and nontender. No distention. Musculoskeletal: No obvious deformities Neurologic:  Normal speech and  language. No gross focal neurologic deficits are appreciated. Skin:  Skin is warm and dry. No rash noted. Psychiatric: Mood and affect are normal. Speech and behavior are normal.  ____________________________________________   LABS (all labs ordered are listed, but only abnormal results are displayed)  Labs Reviewed  BASIC METABOLIC PANEL - Abnormal; Notable for the following components:      Result Value   Glucose, Bld 192 (*)    All other components within normal limits  CBC - Abnormal; Notable for the following components:   WBC 14.8 (*)    Platelets 487 (*)    All other components within normal limits  URINALYSIS, COMPLETE (UACMP) WITH MICROSCOPIC - Abnormal; Notable for the following components:   Hgb urine dipstick TRACE (*)    All other components within normal limits  RESP PANEL BY RT-PCR (FLU A&B, COVID) ARPGX2  CULTURE, BLOOD (ROUTINE X 2)  CULTURE, BLOOD (ROUTINE X 2)  CULTURE, BLOOD (SINGLE)  LACTIC ACID, PLASMA  PROTIME-INR  APTT  HEPATIC FUNCTION PANEL  LACTIC ACID, PLASMA  CBG MONITORING, ED  POC URINE PREG, ED    RADIOLOGY  ED MD interpretation: One-view portable chest x-ray shows no evidence of acute abnormalities including no pneumonia, pneumothorax, or widened mediastinum  Official radiology report(s): DG Chest Port 1 View  Result Date: 01/03/2021 CLINICAL DATA:  Questionable sepsis. EXAM: PORTABLE CHEST 1 VIEW COMPARISON:  Chest x-ray dated November 16, 2018. FINDINGS: The heart size and mediastinal contours are within normal limits. Both lungs are clear. The visualized skeletal structures are unremarkable. IMPRESSION: No active disease. Electronically Signed   By: Obie DredgeWilliam T Derry M.D.   On: 01/03/2021 19:01    ____________________________________________   PROCEDURES  Procedure(s) performed (including Critical Care):  .1-3 Lead EKG Interpretation Performed by: Merwyn KatosBradler, Lacrecia Delval K, MD Authorized by: Merwyn KatosBradler, Shirin Echeverry K, MD     Interpretation: normal      ECG rate:  84   ECG rate assessment: normal     Rhythm: sinus rhythm     Ectopy: none     Conduction: normal     ____________________________________________   INITIAL IMPRESSION / ASSESSMENT AND PLAN / ED COURSE  As part of my medical decision making, I reviewed the following data within the electronic medical record, if available:  Nursing notes reviewed and incorporated, Labs reviewed, EKG interpreted, Old chart reviewed, Radiograph reviewed and Notes from prior ED  visits reviewed and incorporated     The Pt presents with upper maxillary dental pain status post tooth extraction c/f a suspected dental infection. The Pt is afebrile and well appearing, without evidence of periapical abscess, ANUG, deep space neck infxn, Ludwig's, or mastoiditis. No trismus or airway involvement. Discussed supportive care and recommended urgent follow up with a dentist.  - Clindamycin x7d - Urgent follow up with a dentist  Po: Discharge home     ____________________________________________   FINAL CLINICAL IMPRESSION(S) / ED DIAGNOSES  Final diagnoses:  Dental infection     ED Discharge Orders          Ordered    clindamycin (CLEOCIN) 300 MG capsule  3 times daily        01/03/21 2117             Note:  This document was prepared using Dragon voice recognition software and may include unintentional dictation errors.    Merwyn Katos, MD 01/03/21 [phone]

## 2021-01-03 NOTE — ED Triage Notes (Signed)
Pt comes with c/o lightheaded, dizzy and vomiting. Pt states she was at the clinic and unsure how she got over here.  Pt states she had dental issues and was taking antibiotics. Pt states daughter is worried she might be septic.

## 2021-01-03 NOTE — Consult Note (Signed)
CODE SEPSIS - PHARMACY COMMUNICATION  **Broad Spectrum Antibiotics should be administered within 1 hour of Sepsis diagnosis**  Time Code Sepsis Called/Page Received: 1801  Antibiotics Ordered: 1801  Time of 1st antibiotic administration: 1820  Additional action taken by pharmacy: N/A  If necessary, Name of Provider/Nurse Contacted: N/A    Derrek Gu ,PharmD Clinical Pharmacist  01/03/2021  6:13 PM

## 2021-01-03 NOTE — Sepsis Progress Note (Signed)
eLink is monitoring this Code Sepsis. °

## 2021-01-03 NOTE — ED Notes (Signed)
Pt ambulates to bathroom, able to void sans complications.

## 2021-01-03 NOTE — Sepsis Progress Note (Signed)
Code Sepsis discontinued, patient discharged.

## 2021-01-03 NOTE — ED Notes (Signed)
Attempt to look for 2nd PIV.  Pt ambulated to toilet, able to provide urine sample. Sample sent to lab.   Given warm packs. Warm blankets. Call light in reach.

## 2021-01-03 NOTE — Consult Note (Signed)
PHARMACY -  BRIEF ANTIBIOTIC NOTE   Pharmacy has received consult(s) for vancomycin and cefepime from an ED provider.  The patient's profile has been reviewed for ht/wt/allergies/indication/available labs.    One time order(s) placed for vancomycin 1750 mg and cefepime 2 g IV   Further antibiotics/pharmacy consults should be ordered by admitting physician if indicated.                       Thank you, Derrek Gu, PharmD 01/03/2021  6:15 PM

## 2021-01-08 LAB — CULTURE, BLOOD (ROUTINE X 2)
Culture: NO GROWTH
Special Requests: ADEQUATE

## 2021-01-08 LAB — CULTURE, BLOOD (SINGLE): Culture: NO GROWTH

## 2021-03-18 ENCOUNTER — Emergency Department: Payer: Medicaid Other

## 2021-03-18 ENCOUNTER — Emergency Department
Admission: EM | Admit: 2021-03-18 | Discharge: 2021-03-18 | Disposition: A | Discharge status: Home / Self Care | Payer: Medicaid Other | Attending: Emergency Medicine | Admitting: Emergency Medicine

## 2021-03-18 ENCOUNTER — Encounter: Payer: Self-pay | Admitting: Emergency Medicine

## 2021-03-18 DIAGNOSIS — Y9301 Activity, walking, marching and hiking: Secondary | ICD-10-CM | POA: Diagnosis not present

## 2021-03-18 DIAGNOSIS — F17210 Nicotine dependence, cigarettes, uncomplicated: Secondary | ICD-10-CM | POA: Insufficient documentation

## 2021-03-18 DIAGNOSIS — M25552 Pain in left hip: Secondary | ICD-10-CM | POA: Insufficient documentation

## 2021-03-18 DIAGNOSIS — W01198A Fall on same level from slipping, tripping and stumbling with subsequent striking against other object, initial encounter: Secondary | ICD-10-CM | POA: Insufficient documentation

## 2021-03-18 DIAGNOSIS — J45909 Unspecified asthma, uncomplicated: Secondary | ICD-10-CM | POA: Diagnosis not present

## 2021-03-18 DIAGNOSIS — Z7952 Long term (current) use of systemic steroids: Secondary | ICD-10-CM | POA: Diagnosis not present

## 2021-03-18 DIAGNOSIS — J441 Chronic obstructive pulmonary disease with (acute) exacerbation: Secondary | ICD-10-CM | POA: Diagnosis not present

## 2021-03-18 DIAGNOSIS — R059 Cough, unspecified: Secondary | ICD-10-CM | POA: Insufficient documentation

## 2021-03-18 DIAGNOSIS — R0781 Pleurodynia: Secondary | ICD-10-CM | POA: Insufficient documentation

## 2021-03-18 DIAGNOSIS — R52 Pain, unspecified: Secondary | ICD-10-CM

## 2021-03-18 MED ORDER — OXYCODONE-ACETAMINOPHEN 5-325 MG PO TABS
1.0000 | ORAL_TABLET | Freq: Once | ORAL | Status: AC
  Administered 2021-03-18: 1 via ORAL
  Filled 2021-03-18: qty 1

## 2021-03-18 MED ORDER — ONDANSETRON 4 MG PO TBDP: 4.0000 mg | ORAL_TABLET | Freq: Three times a day (TID) | ORAL | 0 refills | Status: DC | PRN

## 2021-03-18 MED ORDER — OXYCODONE HCL 5 MG PO TABS: 5.0000 mg | ORAL_TABLET | Freq: Three times a day (TID) | ORAL | 0 refills | Status: DC | PRN

## 2021-03-18 MED ORDER — NAPROXEN 250 MG PO TABS: 250.0000 mg | ORAL_TABLET | Freq: Two times a day (BID) | ORAL | 0 refills | Status: AC

## 2021-03-18 MED ORDER — ONDANSETRON 4 MG PO TBDP
4.0000 mg | ORAL_TABLET | Freq: Once | ORAL | Status: AC
  Administered 2021-03-18: 4 mg via ORAL
  Filled 2021-03-18: qty 1

## 2021-03-18 MED ORDER — KETOROLAC TROMETHAMINE 30 MG/ML IJ SOLN: 15.0000 mg | Freq: Once | INTRAMUSCULAR | Status: DC

## 2021-03-18 NOTE — ED Triage Notes (Addendum)
Pt in with pain to L hip and L rib/chest wall pain, both ongoing for some time, but worsened by a recent fall. States she had a trip fall, landed on L side. Pt states she knows she had a rib fx prior to fall, as she has had them before d/t coughing. Also c/o pain to L hip. Ambulatory to triage. Denies any other injuries, no thinners. States painful to deep breathe

## 2021-03-18 NOTE — ED Provider Notes (Signed)
[facility]  ____________________________________________   Event Date/Time   First MD Initiated Contact with Patient 03/18/21 [phone]     (approximate)  I have reviewed the triage vital signs and the nursing notes.   HISTORY  Chief Complaint Hip Pain and Rib Pain    HPI Shawna [name] is a 55 y.o. female with past medical history of COPD, GERD, anxiety who presents with rib pain and hip pain after fall.  Patient tells me that she is fairly certain that she broke her rib on the left about 3 days ago when she was coughing.  Says that she has broken ribs from coughing in the past and she felt an acute pop and had pain in that area since.  Then tonight she was walking and tripped on the rug and fell onto her left side hitting her left chest wall and her left hip.  She did not hit her head.  She was concerned about potentially having punctured her lung with the fall so she comes to the ED.  She was able to ambulate.  She does have pain with breathing and is mildly short of breath as a result.  Has had cough is productive of clear sputum.  No fevers or chills.  She denies abdominal pain.  Denies neck pain, numbness or weakness in her extremities.         Past Medical History:  Diagnosis Date   Anxiety    Asthma    Bulging lumbar disc    Bulging lumbar disc    GERD (gastroesophageal reflux disease)    Insomnia    Kidney stone    Pneumonia DEC 2016   Sciatic leg pain    Right side   Sciatica of right side    Weakness    Bil knees, and ankles    Patient Active Problem List   Diagnosis Date Noted   Visual hallucinations 07/30/2017   Colitis 01/22/2017   Dyslipidemia 07/26/2016   Low vitamin B12 level 07/26/2016   Major depressive disorder, recurrent severe without psychotic features ([place]) 07/23/2016   Overdose of sedative or hypnotic 07/23/2016   COPD exacerbation ([place]) 07/23/2016   GERD (gastroesophageal reflux disease) 07/23/2016   Cannabis use  disorder, moderate, dependence ([place]) 07/23/2016   Tobacco use disorder 07/23/2016   Sedative, hypnotic or anxiolytic use disorder, severe, dependence ([place]) 07/23/2016   Bacterial pneumonia 01/23/2015   Leukocytosis 01/23/2015   Sepsis ([place]) 01/23/2015   Hypoxemia 01/23/2015   Opioid use disorder, moderate, dependence ([place]) 12/31/2014   Anxiety disorder, unspecified 12/31/2014    Past Surgical History:  Procedure Laterality Date   ABDOMINAL HYSTERECTOMY     Partial   ESOPHAGOGASTRODUODENOSCOPY (EGD) WITH PROPOFOL N/A 11/27/2015   Procedure: ESOPHAGOGASTRODUODENOSCOPY (EGD) WITH PROPOFOL;  Surgeon: Lollie Sails, MD;  Location: [facility] ENDOSCOPY;  Service: Endoscopy;  Laterality: N/A;   SHOULDER SURGERY Right    TONSILLECTOMY     TUBAL LIGATION      Prior to Admission medications   Medication Sig Start Date End Date Taking? Authorizing Provider  acetaminophen (TYLENOL) 500 MG tablet Take 500 mg by mouth every 6 (six) hours as needed.    [provider]  albuterol (PROVENTIL HFA;VENTOLIN HFA) 108 (90 Base) MCG/ACT inhaler Inhale 2 puffs into the lungs every 6 (six) hours as needed for wheezing or shortness of breath. 01/16/16   Daymon Larsen, MD  aluminum-magnesium hydroxide-simethicone (MAALOX) 200-200-20 MG/5ML SUSP Take 30 mLs by mouth 4 (four) times daily -  before meals and at bedtime. 02/28/19   Carrie Mew, MD  amoxicillin (AMOXIL) 875 MG tablet Take 1 tablet (875 mg total) by mouth 2 (two) times daily. 11/02/20   Johnn Hai, PA-C  clonazePAM (KLONOPIN) 0.5 MG tablet Take 0.5 mg by mouth 2 (two) times daily as needed for anxiety.    [provider]  dexlansoprazole (DEXILANT) 60 MG capsule Take 1 capsule by mouth daily.    [provider]  dicyclomine (BENTYL) 20 MG tablet Take 1 tablet (20 mg total) by mouth 3 (three) times daily as needed for spasms. 03/04/19 03/03/20  Triplett, Johnette Abraham B, FNP  DULoxetine 40 MG CPEP Take 40 mg by mouth daily.  07/27/16   Pucilowska, Herma Ard B, MD  famotidine (PEPCID) 20 MG tablet Take 1 tablet (20 mg total) by mouth 2 (two) times daily. 02/28/19   Carrie Mew, MD  fexofenadine (ALLEGRA) 180 MG tablet Take 180 mg by mouth daily.    [provider]  fluticasone (FLONASE) 50 MCG/ACT nasal spray Place 2 sprays into both nostrils daily.     [provider]  hydrOXYzine (ATARAX/VISTARIL) 50 MG tablet Take 50 mg by mouth every 6 (six) hours as needed.     [provider]  lidocaine (XYLOCAINE) 2 % solution Use as directed 15 mLs in the mouth or throat as needed for mouth pain. 04/13/19   Blake Divine, MD  methylPREDNISolone (MEDROL DOSEPAK) 4 MG TBPK tablet Take Tapered dose as directed 11/16/18   Sable Feil, PA-C  metoCLOPramide (REGLAN) 10 MG tablet Take 1 tablet (10 mg total) by mouth every 6 (six) hours as needed. 02/28/19   Carrie Mew, MD  metoCLOPramide (REGLAN) 10 MG tablet Take 1 tablet (10 mg total) by mouth every 8 (eight) hours as needed for refractory nausea / vomiting. 04/18/19   Etta Quill, NP  ondansetron (ZOFRAN ODT) 4 MG disintegrating tablet Take 1 tablet (4 mg total) by mouth every 8 (eight) hours as needed for nausea or vomiting. 03/11/19   Nena Polio, MD  ondansetron (ZOFRAN ODT) 8 MG disintegrating tablet Take 1 tablet (8 mg total) by mouth every 8 (eight) hours as needed for nausea or vomiting. 03/08/19   Arta Silence, MD  ondansetron (ZOFRAN-ODT) 4 MG disintegrating tablet Take 1 tablet (4 mg total) by mouth every 8 (eight) hours as needed for nausea or vomiting. 02/03/18   Cuthriell, Charline Bills, PA-C  predniSONE (STERAPRED UNI-PAK 21 TAB) 10 MG (21) TBPK tablet Take 6 tablets on the first day and decrease by 1 tablet each day until finished. 03/04/19   Triplett, Cari B, FNP  zolpidem (AMBIEN) 10 MG tablet Take 10 mg by mouth at bedtime as needed for sleep.    [provider]    Allergies Haldol [haloperidol], Morphine and  related, and Propofol  Family History  Problem Relation Age of Onset   Fibromyalgia Mother     Social History Social History   Tobacco Use   Smoking status: Every Day    Packs/day: 1.00    Years: 40.00    Pack years: 40.00    Types: Cigarettes   Smokeless tobacco: Current  Substance Use Topics   Alcohol use: Not Currently    Comment: occ   Drug use: No    Review of Systems   Review of Systems  Respiratory:  Positive for cough and shortness of breath.   Cardiovascular:  Positive for chest pain.  Gastrointestinal:  Negative for abdominal pain, nausea and vomiting.  Musculoskeletal:  Positive for arthralgias and myalgias. Negative for back pain.  Neurological:  Negative for seizures, weakness and headaches.  All other systems reviewed and are negative.  Physical Exam Updated Vital Signs BP 119/80   Pulse 87   Temp 98.1 F (36.7 C) (Oral)   Resp 18   Wt 78 kg   SpO2 98%   BMI 27.76 kg/m   Physical Exam Vitals and nursing note reviewed.  Constitutional:      General: She is not in acute distress.    Appearance: Normal appearance.  HENT:     Head: Normocephalic and atraumatic.  Eyes:     General: No scleral icterus.    Conjunctiva/sclera: Conjunctivae normal.  Pulmonary:     Effort: Pulmonary effort is normal. No respiratory distress.     Breath sounds: No stridor.  Abdominal:     General: Abdomen is flat. There is no distension.     Palpations: Abdomen is soft.     Tenderness: There is no abdominal tenderness. There is no guarding.  Musculoskeletal:        General: No deformity or signs of injury.     Cervical back: Normal range of motion.     Comments: Tenderness to palpation over the left lateral chest wall, no ecchymosis or bony crepitus  Tenderness to palpation over the left greater trochanter, pelvis is stable, able to range the left hip without significant discomfort  About a 5 strength with plantarflexion and dorsiflexion in the left foot, 2+ DP  pulse  Skin:    General: Skin is dry.     Coloration: Skin is not jaundiced or pale.  Neurological:     General: No focal deficit present.     Mental Status: She is alert and oriented to person, place, and time. Mental status is at baseline.  Psychiatric:        Mood and Affect: Mood normal.        Behavior: Behavior normal.     LABS (all labs ordered are listed, but only abnormal results are displayed)  Labs Reviewed - No data to display ____________________________________________  EKG  N/a ____________________________________________  RADIOLOGY I, Madelin Headings, personally viewed and evaluated these images (plain radiographs) as part of my medical decision making, as well as reviewing the written report by the radiologist.  ED MD interpretation: I reviewed the x-ray of the chest which shows a acute left 9th rib fracture but no pneumothorax    ____________________________________________   PROCEDURES  Procedure(s) performed (including Critical Care):  Procedures   ____________________________________________   INITIAL IMPRESSION / ASSESSMENT AND PLAN / ED COURSE  55 year old female with history of COPD and rib fractures in the past presents after fall with left rib pain and left hip pain.  Notably patient is fairly certain that she broke the left rib 3 days ago with coughing and not from this fall.  She notes that she has had a cough and felt an acute pop and that this is happened in the past both on the right and left sides and she had pain there before the fall.  Tonight she also happened to have a mechanical fall onto the left side hitting both her left chest wall and hip.  Patient's vital signs are within normal limits.  She overall appears comfortable but does have significant discomfort with movement.  Her lungs are clear she is not wheezing.  She is tender over the left lateral chest wall without ecchymosis or bony crepitus.  She has no abdominal tenderness.   The hip is mildly tender over the left greater trochanter however she has stable pelvis and is able to range the hip without issue.  X-ray of the hip does not show any acute fracture and given she is ambulatory I am not concerned for occult fracture.  X-ray of the chest does show any acute left ninth rib fracture but no underlying pneumothorax hemothorax or contusion.  Will treat patient's pain with opiates and NSAIDs.  We discussed the importance of taking deep breaths to avoid atelectasis and pneumonia.  Return precautions discussed.  I also advised that the patient follow-up with her PCP as she would likely benefit from a DEXA scan given the rib fractures in the setting of coughing.  ____________________________________________   FINAL CLINICAL IMPRESSION(S) / ED DIAGNOSES  Final diagnoses:  Rib pain on left side     ED Discharge Orders     None        Note:  This document was prepared using Dragon voice recognition software and may include unintentional dictation errors.    Georga Hacking, MD 03/18/21 0430

## 2021-03-18 NOTE — ED Notes (Signed)
Patient verbalizes understanding of discharge instructions. Opportunity for questioning and answers were provided. Armband removed by staff, pt discharged from ED. Ambulated out to lobby  

## 2021-03-18 NOTE — Discharge Instructions (Addendum)
You can take naproxen, Tylenol and oxycodone for your rib pain.  If your pain is not controlled or you are becoming short of breath, please return to the emergency department.  Please follow-up with your primary care provider regarding your COPD and your rib fracture.

## 2021-03-21 ENCOUNTER — Other Ambulatory Visit: Payer: Self-pay

## 2021-03-21 ENCOUNTER — Encounter: Payer: Self-pay | Admitting: Emergency Medicine

## 2021-03-21 ENCOUNTER — Emergency Department
Admission: EM | Admit: 2021-03-21 | Discharge: 2021-03-21 | Disposition: A | Discharge status: Home / Self Care | Payer: Medicaid Other | Attending: Emergency Medicine | Admitting: Emergency Medicine

## 2021-03-21 ENCOUNTER — Emergency Department: Payer: Medicaid Other

## 2021-03-21 DIAGNOSIS — Y9389 Activity, other specified: Secondary | ICD-10-CM | POA: Insufficient documentation

## 2021-03-21 DIAGNOSIS — F17210 Nicotine dependence, cigarettes, uncomplicated: Secondary | ICD-10-CM | POA: Insufficient documentation

## 2021-03-21 DIAGNOSIS — J441 Chronic obstructive pulmonary disease with (acute) exacerbation: Secondary | ICD-10-CM | POA: Diagnosis not present

## 2021-03-21 DIAGNOSIS — J45909 Unspecified asthma, uncomplicated: Secondary | ICD-10-CM | POA: Insufficient documentation

## 2021-03-21 DIAGNOSIS — W010XXA Fall on same level from slipping, tripping and stumbling without subsequent striking against object, initial encounter: Secondary | ICD-10-CM | POA: Insufficient documentation

## 2021-03-21 DIAGNOSIS — S20212A Contusion of left front wall of thorax, initial encounter: Secondary | ICD-10-CM | POA: Insufficient documentation

## 2021-03-21 DIAGNOSIS — S299XXA Unspecified injury of thorax, initial encounter: Secondary | ICD-10-CM | POA: Diagnosis present

## 2021-03-21 DIAGNOSIS — Y9289 Other specified places as the place of occurrence of the external cause: Secondary | ICD-10-CM | POA: Insufficient documentation

## 2021-03-21 MED ORDER — ONDANSETRON HCL 4 MG PO TABS: 4.0000 mg | ORAL_TABLET | Freq: Three times a day (TID) | ORAL | 0 refills | Status: DC | PRN

## 2021-03-21 MED ORDER — ONDANSETRON 4 MG PO TBDP
4.0000 mg | ORAL_TABLET | Freq: Once | ORAL | Status: AC
  Administered 2021-03-21: 4 mg via ORAL

## 2021-03-21 MED ORDER — OXYCODONE-ACETAMINOPHEN 5-325 MG PO TABS
1.0000 | ORAL_TABLET | Freq: Once | ORAL | Status: AC
  Administered 2021-03-21: 1 via ORAL
  Filled 2021-03-21: qty 1

## 2021-03-21 MED ORDER — OXYCODONE HCL 5 MG PO TABS: 5.0000 mg | ORAL_TABLET | Freq: Four times a day (QID) | ORAL | 0 refills | Status: AC | PRN

## 2021-03-21 MED ORDER — ONDANSETRON 4 MG PO TBDP
ORAL_TABLET | ORAL | Status: AC
  Filled 2021-03-21: qty 1

## 2021-03-21 NOTE — ED Triage Notes (Signed)
Pt reports tripped and fell while carrying a cardboard box with cloth in it and now has pain to her left side ribs.

## 2021-03-21 NOTE — Discharge Instructions (Addendum)
Your x-ray does not show a new rib fracture but you likely have a contusion on top of the previous rib fracture.  Take the pain medication as needed.  Make sure you are taking deep breaths as instructed.  Use your albuterol and COPD medications as needed.  Return to the ER for new, worsening, or persistent severe pain, difficulty breathing, cough, fever, or any other new or worsening symptoms that concern you.

## 2021-03-21 NOTE — ED Provider Notes (Signed)
[facility] Emergency Department Provider Note ____________________________________________   Event Date/Time   First MD Initiated Contact with Patient 03/21/21 1659     (approximate)  I have reviewed the triage vital signs and the nursing notes.   HISTORY  Chief Complaint Fall and Rib Injury    HPI Shawna [name] is a 55 y.o. female with PMH as noted below including COPD who presents with left rib pain after a fall today, acute onset when she was carrying boxes and tripped and fell.  The patient states that she broke a rib on that side several days ago when she fell she hit the same spot on the left lower part of her chest.  She denies any other injuries.  The patient states that she has chronic shortness of breath related to COPD.  If she holds her arm against her side she is able to take a full deep breath without too much pain.  She denies cough or fever.  Past Medical History:  Diagnosis Date   Anxiety    Asthma    Bulging lumbar disc    Bulging lumbar disc    GERD (gastroesophageal reflux disease)    Insomnia    Kidney stone    Pneumonia DEC 2016   Sciatic leg pain    Right side   Sciatica of right side    Weakness    Bil knees, and ankles    Patient Active Problem List   Diagnosis Date Noted   Visual hallucinations 07/30/2017   Colitis 01/22/2017   Dyslipidemia 07/26/2016   Low vitamin B12 level 07/26/2016   Major depressive disorder, recurrent severe without psychotic features ([place]) 07/23/2016   Overdose of sedative or hypnotic 07/23/2016   COPD exacerbation ([place]) 07/23/2016   GERD (gastroesophageal reflux disease) 07/23/2016   Cannabis use disorder, moderate, dependence ([place]) 07/23/2016   Tobacco use disorder 07/23/2016   Sedative, hypnotic or anxiolytic use disorder, severe, dependence ([place]) 07/23/2016   Bacterial pneumonia 01/23/2015   Leukocytosis 01/23/2015   Sepsis ([place]) 01/23/2015   Hypoxemia 01/23/2015   Opioid use  disorder, moderate, dependence ([place]) 12/31/2014   Anxiety disorder, unspecified 12/31/2014    Past Surgical History:  Procedure Laterality Date   ABDOMINAL HYSTERECTOMY     Partial   ESOPHAGOGASTRODUODENOSCOPY (EGD) WITH PROPOFOL N/A 11/27/2015   Procedure: ESOPHAGOGASTRODUODENOSCOPY (EGD) WITH PROPOFOL;  Surgeon: Lollie Sails, MD;  Location: [facility] ENDOSCOPY;  Service: Endoscopy;  Laterality: N/A;   SHOULDER SURGERY Right    TONSILLECTOMY     TUBAL LIGATION      Prior to Admission medications   Medication Sig Start Date End Date Taking? Authorizing Provider  oxyCODONE (ROXICODONE) 5 MG immediate release tablet Take 1 tablet (5 mg total) by mouth every 6 (six) hours as needed for up to 5 days for severe pain. 03/21/21 03/26/21 Yes Arta Silence, MD  acetaminophen (TYLENOL) 500 MG tablet Take 500 mg by mouth every 6 (six) hours as needed.    [provider]  albuterol (PROVENTIL HFA;VENTOLIN HFA) 108 (90 Base) MCG/ACT inhaler Inhale 2 puffs into the lungs every 6 (six) hours as needed for wheezing or shortness of breath. 01/16/16   Daymon Larsen, MD  aluminum-magnesium hydroxide-simethicone (MAALOX) 200-200-20 MG/5ML SUSP Take 30 mLs by mouth 4 (four) times daily -  before meals and at bedtime. 02/28/19   Carrie Mew, MD  amoxicillin (AMOXIL) 875 MG tablet Take 1 tablet (875 mg total) by mouth 2 (two) times daily. 11/02/20  Johnn Hai, PA-C  clonazePAM (KLONOPIN) 0.5 MG tablet Take 0.5 mg by mouth 2 (two) times daily as needed for anxiety.    [provider]  dexlansoprazole (DEXILANT) 60 MG capsule Take 1 capsule by mouth daily.    [provider]  dicyclomine (BENTYL) 20 MG tablet Take 1 tablet (20 mg total) by mouth 3 (three) times daily as needed for spasms. 03/04/19 03/03/20  Triplett, Johnette Abraham B, FNP  DULoxetine 40 MG CPEP Take 40 mg by mouth daily. 07/27/16   Pucilowska, Herma Ard B, MD  famotidine (PEPCID) 20 MG tablet Take 1 tablet (20 mg total)  by mouth 2 (two) times daily. 02/28/19   Carrie Mew, MD  fexofenadine (ALLEGRA) 180 MG tablet Take 180 mg by mouth daily.    [provider]  fluticasone (FLONASE) 50 MCG/ACT nasal spray Place 2 sprays into both nostrils daily.     [provider]  hydrOXYzine (ATARAX/VISTARIL) 50 MG tablet Take 50 mg by mouth every 6 (six) hours as needed.     [provider]  lidocaine (XYLOCAINE) 2 % solution Use as directed 15 mLs in the mouth or throat as needed for mouth pain. 04/13/19   Blake Divine, MD  methylPREDNISolone (MEDROL DOSEPAK) 4 MG TBPK tablet Take Tapered dose as directed 11/16/18   Sable Feil, PA-C  metoCLOPramide (REGLAN) 10 MG tablet Take 1 tablet (10 mg total) by mouth every 6 (six) hours as needed. 02/28/19   Carrie Mew, MD  metoCLOPramide (REGLAN) 10 MG tablet Take 1 tablet (10 mg total) by mouth every 8 (eight) hours as needed for refractory nausea / vomiting. 04/18/19   Etta Quill, NP  naproxen (NAPROSYN) 250 MG tablet Take 1 tablet (250 mg total) by mouth 2 (two) times daily with a meal for 10 days. 03/18/21 03/28/21  Rada Hay, MD  ondansetron (ZOFRAN) 4 MG tablet Take 1 tablet (4 mg total) by mouth every 8 (eight) hours as needed for nausea or vomiting. 03/21/21   Arta Silence, MD  predniSONE (STERAPRED UNI-PAK 21 TAB) 10 MG (21) TBPK tablet Take 6 tablets on the first day and decrease by 1 tablet each day until finished. 03/04/19   Triplett, Cari B, FNP  zolpidem (AMBIEN) 10 MG tablet Take 10 mg by mouth at bedtime as needed for sleep.    [provider]    Allergies Haldol [haloperidol], Morphine and related, and Propofol  Family History  Problem Relation Age of Onset   Fibromyalgia Mother     Social History Social History   Tobacco Use   Smoking status: Every Day    Packs/day: 1.00    Years: 40.00    Pack years: 40.00    Types: Cigarettes   Smokeless tobacco: Current  Substance Use Topics    Alcohol use: Not Currently    Comment: occ   Drug use: No    Review of Systems  Constitutional: No fever.  Eyes: No redness. ENT: No sore throat. Cardiovascular: Positive for chest wall pain. Respiratory: Positive for chronic shortness of breath. Gastrointestinal: No vomiting or diarrhea.  Genitourinary: Negative for dysuria.  Musculoskeletal: Negative for back pain. Skin: Negative for rash. Neurological: Negative for headache.  ____________________________________________   PHYSICAL EXAM:  VITAL SIGNS: ED Triage Vitals [03/21/21 1608]  Enc Vitals Group     BP      Pulse      Resp      Temp      Temp src  SpO2      Weight 172 lb (78 kg)     Height 5\' 6"  (1.676 m)     Head Circumference      Peak Flow      Pain Score 9     Pain Loc      Pain Edu?      Excl. in GC?     Constitutional: Alert and oriented. Well appearing and in no acute distress. Eyes: Conjunctivae are normal.  Head: Atraumatic. Nose: No congestion/rhinnorhea. Mouth/Throat: Mucous membranes are moist.   Neck: Normal range of motion.  Cardiovascular: Normal rate, regular rhythm. Grossly normal heart sounds.  Good peripheral circulation. Respiratory: Normal respiratory effort.  No retractions. Lungs CTAB. Gastrointestinal: Soft and nontender. No distention.  Musculoskeletal: Extremities warm and well perfused.  No midline spinal tenderness.  Left anterior lateral lower rib border tenderness with no step-off or crepitus. Neurologic:  Normal speech and language. No gross focal neurologic deficits are appreciated.  Skin:  Skin is warm and dry. No rash noted. Psychiatric: Mood and affect are normal. Speech and behavior are normal.  ____________________________________________   LABS (all labs ordered are listed, but only abnormal results are displayed)  Labs Reviewed - No data to  display ____________________________________________  EKG   ____________________________________________  RADIOLOGY  XR chest/L ribs: IMPRESSION:  No definitive acute rib fractures identified. There are old rib  fractures bilaterally and mild bibasilar atelectasis.   ____________________________________________   PROCEDURES  Procedure(s) performed: No  Procedures  Critical Care performed: No ____________________________________________   INITIAL IMPRESSION / ASSESSMENT AND PLAN / ED COURSE  Pertinent labs & imaging results that were available during my care of the patient were reviewed by me and considered in my medical decision making (see chart for details).   55 year old female with PMH as noted above presents with left rib pain after a fall; the patient was here several days ago with a rib fracture in the same area.  I reviewed the past medical records in Epic and confirmed that the patient was seen here on 11/22.  X-ray showed a mildly displaced left ninth rib fracture.  On exam today the patient is well-appearing and her vital signs are normal.  She is tender in the same area.  Lungs are clear to auscultation.  Overall presentation is consistent with rib contusion.  Chest/rib x-ray performed today shows bilateral chronic appearing fractures but no evidence of acute fracture.  The patient's pain from the prior fracture was well controlled with oxycodone at home so I have prescribed an additional small quantity of this for the next several days as well as Zofran.  Return precautions given, the patient expresses understanding.   ____________________________________________   FINAL CLINICAL IMPRESSION(S) / ED DIAGNOSES  Final diagnoses:  Rib contusion, left, initial encounter      NEW MEDICATIONS STARTED DURING THIS VISIT:  Discharge Medication List as of 03/21/2021  5:24 PM       Note:  This document was prepared using Dragon voice recognition software  and may include unintentional dictation errors.    03/23/2021, MD 03/21/21 1737

## 2021-10-28 ENCOUNTER — Emergency Department
Admission: EM | Admit: 2021-10-28 | Discharge: 2021-10-28 | Discharge status: Against Medical Advice | Payer: Medicaid Other | Attending: Emergency Medicine | Admitting: Emergency Medicine

## 2021-10-28 ENCOUNTER — Encounter: Payer: Self-pay | Admitting: Emergency Medicine

## 2021-10-28 ENCOUNTER — Other Ambulatory Visit: Payer: Self-pay

## 2021-10-28 DIAGNOSIS — W228XXA Striking against or struck by other objects, initial encounter: Secondary | ICD-10-CM | POA: Insufficient documentation

## 2021-10-28 DIAGNOSIS — Z5321 Procedure and treatment not carried out due to patient leaving prior to being seen by health care provider: Secondary | ICD-10-CM | POA: Diagnosis not present

## 2021-10-28 DIAGNOSIS — M545 Low back pain, unspecified: Secondary | ICD-10-CM | POA: Insufficient documentation

## 2021-10-28 NOTE — ED Triage Notes (Signed)
Patient ambulatory to triage with aid of cane without difficulty or distress noted; pt reports lower back pain radiating down rt leg; st hx sciatica and she has "flare-up after door hit her back coming out of the convenience store tonight"

## 2021-12-26 ENCOUNTER — Emergency Department
Admission: EM | Admit: 2021-12-26 | Discharge: 2021-12-26 | Discharge status: Against Medical Advice | Payer: Medicaid Other | Attending: Emergency Medicine | Admitting: Emergency Medicine

## 2021-12-26 ENCOUNTER — Other Ambulatory Visit: Payer: Self-pay

## 2021-12-26 ENCOUNTER — Emergency Department: Payer: Medicaid Other

## 2021-12-26 DIAGNOSIS — R079 Chest pain, unspecified: Secondary | ICD-10-CM | POA: Diagnosis not present

## 2021-12-26 DIAGNOSIS — Z5321 Procedure and treatment not carried out due to patient leaving prior to being seen by health care provider: Secondary | ICD-10-CM | POA: Insufficient documentation

## 2021-12-26 DIAGNOSIS — R1013 Epigastric pain: Secondary | ICD-10-CM | POA: Insufficient documentation

## 2021-12-26 DIAGNOSIS — R110 Nausea: Secondary | ICD-10-CM | POA: Diagnosis not present

## 2021-12-26 LAB — CBC
HCT: 37.3 % (ref 36.0–46.0)
Hemoglobin: 12.4 g/dL (ref 12.0–15.0)
MCH: 29.1 pg (ref 26.0–34.0)
MCHC: 33.2 g/dL (ref 30.0–36.0)
MCV: 87.6 fL (ref 80.0–100.0)
Platelets: 332 10*3/uL (ref 150–400)
RBC: 4.26 MIL/uL (ref 3.87–5.11)
RDW: 14.5 % (ref 11.5–15.5)
WBC: 9.5 10*3/uL (ref 4.0–10.5)
nRBC: 0 % (ref 0.0–0.2)

## 2021-12-26 LAB — HEPATIC FUNCTION PANEL
ALT: 17 U/L (ref 0–44)
AST: 22 U/L (ref 15–41)
Albumin: 3.7 g/dL (ref 3.5–5.0)
Alkaline Phosphatase: 91 U/L (ref 38–126)
Bilirubin, Direct: <0.1 mg/dL (ref 0.0–0.2)
Total Bilirubin: 0.5 mg/dL (ref 0.3–1.2)
Total Protein: 6.7 g/dL (ref 6.5–8.1)

## 2021-12-26 LAB — BASIC METABOLIC PANEL
Anion gap: 6 (ref 5–15)
BUN: 16 mg/dL (ref 6–20)
CO2: 24 mmol/L (ref 22–32)
Calcium: 8.8 mg/dL — ABNORMAL LOW (ref 8.9–10.3)
Chloride: 109 mmol/L (ref 98–111)
Creatinine, Ser: 0.96 mg/dL (ref 0.44–1.00)
GFR, Estimated: >60 mL/min (ref >60)
Glucose, Bld: 163 mg/dL — ABNORMAL HIGH (ref 70–99)
Potassium: 3.7 mmol/L (ref 3.5–5.1)
Sodium: 139 mmol/L (ref 135–145)

## 2021-12-26 LAB — TROPONIN I (HIGH SENSITIVITY): Troponin I (High Sensitivity): 3 ng/L (ref <18)

## 2021-12-26 LAB — LIPASE, BLOOD: Lipase: 28 U/L (ref 11–51)

## 2021-12-26 MED ORDER — ALUM & MAG HYDROXIDE-SIMETH 200-200-20 MG/5ML PO SUSP
30.0000 mL | Freq: Once | ORAL | Status: AC
  Administered 2021-12-26: 30 mL via ORAL
  Filled 2021-12-26: qty 30

## 2021-12-26 NOTE — ED Triage Notes (Signed)
Pt arrived via EMS. Pt states her chest pain started at 1330 and is in the middle of her chest radiating to right jaw. Pt states she is under a lot of stress. Pt complains of nausea and dizziness.

## 2021-12-26 NOTE — ED Provider Notes (Signed)
[facility] Provider Note    Event Date/Time   First MD Initiated Contact with Patient 12/26/21 1534     (approximate)   History   Chest Pain   HPI  Shawna [name] is a 56 y.o. female who reports pain in the chest rating to the jaw that started about 2 hours ago.  It also occurs with and patient currently has epigastric pain that is tender to palpation.  Patient is not sweaty.  She is not short of breath she does complain of nausea and feeling lightheaded.  EMS gave her some aspirin and some nitro without helping anything.  2 nitros in fact.      Physical Exam   Triage Vital Signs: ED Triage Vitals  Enc Vitals Group     BP 12/26/21 1450 115/74     Pulse Rate 12/26/21 1450 65     Resp 12/26/21 1450 18     Temp 12/26/21 1450 98.2 F (36.8 C)     Temp Source 12/26/21 1450 Oral     SpO2 12/26/21 1450 91 %     Weight 12/26/21 1436 182 lb (82.6 kg)     Height 12/26/21 1436 5\' 6"  (1.676 m)     Head Circumference --      Peak Flow --      Pain Score 12/26/21 1434 9     Pain Loc --      Pain Edu? --      Excl. in GC? --     Most recent vital signs: Vitals:   12/26/21 1450  BP: 115/74  Pulse: 65  Resp: 18  Temp: 98.2 F (36.8 C)  SpO2: 91%     General: Awake, no distress.  CV:  Good peripheral perfusion.  Heart regular rate and rhythm no audible murmurs Resp:  Normal effort.  Lungs are clea Abd:  No distention.  Tender to palpation in the epigastric area up under the ribs bilaterally    ED Results / Procedures / Treatments   Labs (all labs ordered are listed, but only abnormal results are displayed) Labs Reviewed  BASIC METABOLIC PANEL - Abnormal; Notable for the following components:      Result Value   Glucose, Bld 163 (*)    Calcium 8.8 (*)    All other components within normal limits  CBC  HEPATIC FUNCTION PANEL  LIPASE, BLOOD  POC URINE PREG, ED  TROPONIN I (HIGH SENSITIVITY)     EKG  EKG read interpreted by me shows  normal sinus rhythm rate of 72 normal axis nonspecific ST-T changes looks similar to several prior EKGs   RADIOLOGY X-ray read by radiology reviewed and interpreted by me as negative  PROCEDURES:  Critical Care performed:   Procedures   MEDICATIONS ORDERED IN ED: Medications  alum & mag hydroxide-simeth (MAALOX/MYLANTA) 200-200-20 MG/5ML suspension 30 mL (30 mLs Oral Given 12/26/21 1609)     IMPRESSION / MDM / ASSESSMENT AND PLAN / ED COURSE  I reviewed the triage vital signs and the nursing notes. I go to see the patient.  The first thing she asked me for his pain medicine.  I said let me try the GI cocktail to see if this helps.  About 3 minutes later she comes out of the door and insists on being discharged.  She will not stay with GI cocktail or anything else.  I have told her that we are in the process of evaluating her heart everything looks good so  far but I do need to get 1 more heart blood test to see what is going on for sure.  She will not stay.  She knows the risk of heart disease or heart attack is possible with possible death.    The patient is on the cardiac monitor to evaluate for evidence of arrhythmia and/or significant heart rate changes.  None have been seen  any other interventions?:1}     FINAL CLINICAL IMPRESSION(S) / ED DIAGNOSES   Final diagnoses:  Chest pain, unspecified type     Rx / DC Orders   ED Discharge Orders          Ordered    Ambulatory referral to Cardiology       Comments: If you have not heard from the Cardiology office within the next 72 hours please call [phone].   12/26/21 1609             Note:  This document was prepared using Dragon voice recognition software and may include unintentional dictation errors.   Arnaldo Natal, MD 12/26/21 [phone]

## 2021-12-26 NOTE — ED Notes (Signed)
Pt verbalized understanding of instructions and follow-up care instructions. Pt verbalized understanding that they are leaving AMA. E-signature obtained for [facility] papers.

## 2021-12-26 NOTE — ED Notes (Signed)
Pt states she has been having chest pain that started today. Pt states she is ready to go home and has requested to leave. RN could not complete assessment due to pt refusing.

## 2021-12-26 NOTE — Discharge Instructions (Signed)
I wish you would stay a little longer and let us further evaluate your chest pain and your epigastric pain.  Please follow-up with your doctor or your cardiologist or return if you change your mind.  I have put in a cardiology referral for you.

## 2021-12-26 NOTE — ED Triage Notes (Signed)
Arrived by EMS from home with c/o chest pain that started around 1pm. Midsternal, non radiating. EMS gave 324 aspirin and 2 nitro sublingual with no relief.   EMS vitals:  124/74 b/p 84HR normal sinus 95%RA   Hx COPD

## 2022-01-15 ENCOUNTER — Emergency Department: Payer: Medicaid Other

## 2022-01-15 ENCOUNTER — Emergency Department
Admission: EM | Admit: 2022-01-15 | Discharge: 2022-01-15 | Disposition: A | Discharge status: Home / Self Care | Payer: Medicaid Other | Attending: Emergency Medicine | Admitting: Emergency Medicine

## 2022-01-15 ENCOUNTER — Encounter: Payer: Self-pay | Admitting: Emergency Medicine

## 2022-01-15 DIAGNOSIS — J449 Chronic obstructive pulmonary disease, unspecified: Secondary | ICD-10-CM | POA: Diagnosis not present

## 2022-01-15 DIAGNOSIS — R0789 Other chest pain: Secondary | ICD-10-CM | POA: Insufficient documentation

## 2022-01-15 LAB — CBC
HCT: 37.9 % (ref 36.0–46.0)
Hemoglobin: 12.4 g/dL (ref 12.0–15.0)
MCH: 28.8 pg (ref 26.0–34.0)
MCHC: 32.7 g/dL (ref 30.0–36.0)
MCV: 87.9 fL (ref 80.0–100.0)
Platelets: 352 10*3/uL (ref 150–400)
RBC: 4.31 MIL/uL (ref 3.87–5.11)
RDW: 14.4 % (ref 11.5–15.5)
WBC: 13.1 10*3/uL — ABNORMAL HIGH (ref 4.0–10.5)
nRBC: 0 % (ref 0.0–0.2)

## 2022-01-15 LAB — BASIC METABOLIC PANEL
Anion gap: 11 (ref 5–15)
BUN: 21 mg/dL — ABNORMAL HIGH (ref 6–20)
CO2: 19 mmol/L — ABNORMAL LOW (ref 22–32)
Calcium: 8.8 mg/dL — ABNORMAL LOW (ref 8.9–10.3)
Chloride: 102 mmol/L (ref 98–111)
Creatinine, Ser: 1.22 mg/dL — ABNORMAL HIGH (ref 0.44–1.00)
GFR, Estimated: 53 mL/min — ABNORMAL LOW (ref >60)
Glucose, Bld: 374 mg/dL — ABNORMAL HIGH (ref 70–99)
Potassium: 3.7 mmol/L (ref 3.5–5.1)
Sodium: 132 mmol/L — ABNORMAL LOW (ref 135–145)

## 2022-01-15 LAB — TROPONIN I (HIGH SENSITIVITY)
Troponin I (High Sensitivity): 3 ng/L (ref <18)
Troponin I (High Sensitivity): 3 ng/L (ref <18)

## 2022-01-15 MED ORDER — ACETAMINOPHEN 500 MG PO TABS
1000.0000 mg | ORAL_TABLET | Freq: Once | ORAL | Status: AC
  Administered 2022-01-15: 1000 mg via ORAL
  Filled 2022-01-15: qty 2

## 2022-01-15 NOTE — ED Notes (Signed)
Pt refusing BP

## 2022-01-15 NOTE — Discharge Instructions (Signed)

## 2022-01-15 NOTE — ED Provider Notes (Signed)
[facility] Provider Note    Event Date/Time   First MD Initiated Contact with Patient 01/15/22 0602     (approximate)   History   Chest Pain   HPI  Shawna [name] is a 56 y.o. female with medical history that includes COPD, cannabis use disorder, opioid use disorder, prior history of overdoses of sedatives, "sedative, hypnotic, or anxiolytic use disorder", sciatica, lumbar disc pain, multiple abdominal surgeries, and multiple prior orthopedic injuries.  She presents tonight for evaluation of chest pain.  She states that she has been having episodes of chest pressure which have been going on for weeks, perhaps more than a month.  She said that tonight it felt like it was radiating from the left side of her chest through to her back so she knew she better get it checked out.  She came to the emergency department about 3 weeks ago and left prior to full cardiac rule out once the ED physician told her that he was not going to prescribe or order any strong pain medicine.  However, she said that she knows she needs to be seen tonight to make sure everything is okay.  She denies shortness of breath, vomiting, and dysuria.  She has sometimes experienced some nausea.  No recent diarrhea.  No fever.       Physical Exam   Triage Vital Signs: ED Triage Vitals  Enc Vitals Group     BP 01/15/22 0253 (!) 119/53     Pulse Rate 01/15/22 0253 75     Resp 01/15/22 0253 20     Temp 01/15/22 0253 98.6 F (37 C)     Temp Source 01/15/22 0253 Oral     SpO2 01/15/22 0222 95 %     Weight 01/15/22 0228 82.6 kg (182 lb)     Height 01/15/22 0228 1.676 m (5\' 6" )     Head Circumference --      Peak Flow --      Pain Score 01/15/22 0228 9     Pain Loc --      Pain Edu? --      Excl. in GC? --     Most recent vital signs: Vitals:   01/15/22 0600 01/15/22 0645  BP: 126/71   Pulse: 66 64  Resp: (!) 22 12  Temp:    SpO2: 98% 97%     General: Awake, no distress.   CV:  Good peripheral perfusion.  Normal heart sounds. Resp:  Normal effort.  Lungs clear to auscultation bilaterally.  No wheezing.  Speaking easily and comfortably. Abd:  No distention.  No tenderness to palpation. Other:  Mood and affect are somewhat depressed but appropriate under the circumstances.  Patient is not expressing SI or HI.   ED Results / Procedures / Treatments   Labs (all labs ordered are listed, but only abnormal results are displayed) Labs Reviewed  BASIC METABOLIC PANEL - Abnormal; Notable for the following components:      Result Value   Sodium 132 (*)    CO2 19 (*)    Glucose, Bld 374 (*)    BUN 21 (*)    Creatinine, Ser 1.22 (*)    Calcium 8.8 (*)    GFR, Estimated 53 (*)    All other components within normal limits  CBC - Abnormal; Notable for the following components:   WBC 13.1 (*)    All other components within normal limits  TROPONIN I (HIGH SENSITIVITY)  TROPONIN  I (HIGH SENSITIVITY)     EKG  ED ECG REPORT I, Loleta Rose, the attending physician, personally viewed and interpreted this ECG.  Date: 01/15/2022 EKG Time: 2:30 AM Rate: 86 Rhythm: normal sinus rhythm QRS Axis: normal Intervals: normal ST/T Wave abnormalities: Non-specific ST segment / T-wave changes, but no clear evidence of acute ischemia. Narrative Interpretation: no definitive evidence of acute ischemia; does not meet STEMI criteria.    RADIOLOGY I viewed and interpreted the patient's 1 view chest x-ray.  There are some old rib fractures but nothing new and no pneumothorax or pneumonia.  Radiology report agrees with this assessment.    PROCEDURES:  Critical Care performed: No  Procedures   MEDICATIONS ORDERED IN ED: Medications  acetaminophen (TYLENOL) tablet 1,000 mg (1,000 mg Oral Given 01/15/22 0656)     IMPRESSION / MDM / ASSESSMENT AND PLAN / ED COURSE  I reviewed the triage vital signs and the nursing notes.                              Differential  diagnosis includes, but is not limited to, ACS, pneumonia, PE, secondary gain.  Patient's presentation is most consistent with acute presentation with potential threat to life or bodily function.  Labs/studies ordered: EKG, chest x-ray, high-sensitivity troponin x2, basic metabolic panel, CBC.  Labs notable for very slight elevation of her creatinine at 1.22 but she is not reporting any volume losses or decreased oral intake.  CBC has a leukocytosis of 13 which is of unclear clinical significance.  No acute abnormalities identified on chest x-ray as documented above.  No indication of ischemia on EKG.  Patient is low risk for ACS based on HEAR score and has a Wells score for PE of 0.  Patient says that the cardiology referral previously ordered by Dr. Darnelle Catalan at her last visit did not come through.  I had my usual and customary chest pain discussion with the patient and recommended that I place a cardiology referral for outpatient follow-up.  I explained that even though she is not having a heart attack tonight, it is important follow-up.  She was focused on what she can take for pain and I recommended ibuprofen and Tylenol.  She says she cannot take aspirin and several times to mention that she needs something for pain.  I explained to her that we do not prescribe opioids for what she is describing as chest pressure and that she needs to follow-up as an outpatient.  After we had that discussion she told the ED staff that I can cancel the cardiology referral because she does not plan to follow-up.  I am concerned about secondary gain but I still maintain that she needs to follow-up with cardiology and I am leaving the order in place.  I gave my usual and customary return precautions.      FINAL CLINICAL IMPRESSION(S) / ED DIAGNOSES   Final diagnoses:  Chest pressure  Atypical chest pain     Rx / DC Orders   ED Discharge Orders          Ordered    Ambulatory referral to Cardiology        Comments: If you have not heard from the Cardiology office within the next 72 hours please call [phone].   01/15/22 1583             Note:  This document was prepared using Dragon voice recognition software and  may include unintentional dictation errors.   Hinda Kehr, MD 01/15/22 [phone]

## 2022-01-15 NOTE — ED Triage Notes (Signed)
Pt presents via POV with complaints of mid-sternal CP with radiation down her back that started 1 hour ago. Rates the pain 9/10 - described as heaviness. No meds taken PTA. Denies fevers, chills, N/V/D.

## 2022-01-31 ENCOUNTER — Emergency Department
Admission: EM | Admit: 2022-01-31 | Discharge: 2022-01-31 | Disposition: A | Discharge status: Home / Self Care | Payer: Medicaid Other | Attending: Emergency Medicine | Admitting: Emergency Medicine

## 2022-01-31 ENCOUNTER — Encounter: Payer: Self-pay | Admitting: Emergency Medicine

## 2022-01-31 ENCOUNTER — Emergency Department: Payer: Medicaid Other

## 2022-01-31 DIAGNOSIS — F17200 Nicotine dependence, unspecified, uncomplicated: Secondary | ICD-10-CM | POA: Diagnosis not present

## 2022-01-31 DIAGNOSIS — R079 Chest pain, unspecified: Secondary | ICD-10-CM | POA: Insufficient documentation

## 2022-01-31 DIAGNOSIS — R109 Unspecified abdominal pain: Secondary | ICD-10-CM | POA: Insufficient documentation

## 2022-01-31 DIAGNOSIS — J449 Chronic obstructive pulmonary disease, unspecified: Secondary | ICD-10-CM | POA: Insufficient documentation

## 2022-01-31 DIAGNOSIS — R0789 Other chest pain: Secondary | ICD-10-CM

## 2022-01-31 MED ORDER — PREDNISONE 20 MG PO TABS: 60.0000 mg | ORAL_TABLET | Freq: Once | ORAL | Status: DC

## 2022-01-31 MED ORDER — IPRATROPIUM-ALBUTEROL 0.5-2.5 (3) MG/3ML IN SOLN: 3.0000 mL | Freq: Once | RESPIRATORY_TRACT | Status: DC

## 2022-01-31 NOTE — ED Triage Notes (Signed)
Pt presents via POV with complaints of epigastric pain that started several hours ago. Seen here for same a few weeks ago. Pt took gas-x PTA without improvement in her sx and endorses a dull heaviness in her upper chest. Hx of COPD - doesn't wear O2. O2 sat in triage was 88% on RA - states "its always low" - pt has dyspnea on exertion. Placed on 2L [place] and sats improved to 96%. Denies N/V/D, fevers or chills.

## 2022-01-31 NOTE — ED Provider Notes (Signed)
[facility] Provider Note    Event Date/Time   First MD Initiated Contact with Patient 01/31/22 [phone]     (approximate)   History   Abdominal Pain   HPI  Shawna [name] is a 56 y.o. female who presents to the ED for evaluation of Abdominal Pain   I reviewed 2 separate ED visits last month for similar presentations of chest discomfort with benign work-ups and managed subsequently as an outpatient.  She has a history of COPD, polysubstance abuse and chronic pain syndrome.    She presents to the ED with chronic intermittent "gas in her chest."  She reports the same symptoms that she has been feeling for multiple months but this felt more severe tonight.  No other coexisting symptoms.  She reports chronic nonproductive cough.  Continues to smoke 1 pack/day.   Physical Exam   Triage Vital Signs: ED Triage Vitals  Enc Vitals Group     BP 01/31/22 0524 118/63     Pulse Rate 01/31/22 0524 72     Resp 01/31/22 0524 18     Temp 01/31/22 0524 98.3 F (36.8 C)     Temp Source 01/31/22 0524 Oral     SpO2 01/31/22 0524 (!) 88 %     Weight 01/31/22 0528 182 lb (82.6 kg)     Height 01/31/22 0528 5\' 6"  (1.676 m)     Head Circumference --      Peak Flow --      Pain Score --      Pain Loc --      Pain Edu? --      Excl. in [place]? --     Most recent vital signs: Vitals:   01/31/22 0524 01/31/22 0537  BP: 118/63   Pulse: 72   Resp: 18   Temp: 98.3 F (36.8 C)   SpO2: (!) 88% 96%    General: Awake, no distress.  Conversational in full sentences CV:  Good peripheral perfusion.  Resp:  Normal effort.  Faint and scattered end expiratory wheezes with good airflow throughout. Abd:  No distention.  MSK:  No deformity noted.  Neuro:  No focal deficits appreciated. Other:     ED Results / Procedures / Treatments   Labs (all labs ordered are listed, but only abnormal results are displayed) Labs Reviewed - No data to display   EKG Sinus rhythm with a rate  of 73 bpm.  Rightward axis.  Incomplete right bundle.  No STEMI.  Nonspecific ST changes are noted.  Inferiorly and laterally.  RADIOLOGY CXR interpreted by me without evidence of acute cardiopulmonary pathology.  Official radiology report(s): DG Chest Portable 1 View  Result Date: 01/31/2022 CLINICAL DATA:  56 year old female with epigastric pain. EXAM: PORTABLE CHEST 1 VIEW COMPARISON:  Portable chest 01/15/2022 and earlier. FINDINGS: Portable AP upright views at 0544 hours. Lung volumes and mediastinal contours remain within normal limits. Allowing for portable technique the lungs are clear. Visualized tracheal air column is within normal limits. No pneumothorax or pleural effusion. Right shoulder arthroplasty redemonstrated. No acute osseous abnormality identified. Paucity of bowel gas in the visible abdomen. IMPRESSION: No acute cardiopulmonary abnormality. Electronically Signed   By: Genevie Ann M.D.   On: 01/31/2022 06:07    PROCEDURES and INTERVENTIONS:  .1-3 Lead EKG Interpretation  Performed by: Vladimir Crofts, MD Authorized by: Vladimir Crofts, MD     Interpretation: normal     ECG rate:  70   ECG rate assessment:  normal     Rhythm: sinus rhythm     Ectopy: none     Conduction: normal     Medications - No data to display   IMPRESSION / MDM / ASSESSMENT AND PLAN / ED COURSE  I reviewed the triage vital signs and the nursing notes.  Differential diagnosis includes, but is not limited to,   {Patient presents with symptoms of an acute illness or injury that is potentially life-threatening.  56 year old woman presents with chronic intermittent gassy sensation in her chest.  On arrival and during my initial evaluation with the patient I recommend a cardiac evaluation, treatment of her wheezing with breathing treatments and steroids to treat COPD exacerbation that could be contributing to her discomfort.  She was initially agreeable with plan of care.  CXR is unremarkable.  I am called  back into the room after the nurse goes in to draw blood and initiate this treatment protocol, and she is exasperated and frustrated.  She reports that she was here previously a few weeks ago for her heart to be checked out and reports that she does not need to do this again.  She reports she was "here for treatment for the gas in my chest not to get my heart checked out and get poked over and over."  I tried explained to her in multiple ways my recommendations for cardiac evaluation and treatment of her COPD as we assess the possible etiologies of her symptoms.  I tried to better understand her expectations, but she ends up demanding discharge and walking out of the ED in no acute distress after refusing my recommendations and plan of care.  I did place a referral to cardiology for her.      FINAL CLINICAL IMPRESSION(S) / ED DIAGNOSES   Final diagnoses:  Gassy chest pain     Rx / DC Orders   ED Discharge Orders          Ordered    Ambulatory referral to Cardiology       Comments: If you have not heard from the Cardiology office within the next 72 hours please call [phone].   01/31/22 5597             Note:  This document was prepared using Dragon voice recognition software and may include unintentional dictation errors.   Delton Prairie, MD 01/31/22 [phone]

## 2022-01-31 NOTE — ED Notes (Signed)
MD. Tamala Julian @bedside , the MD has been made aware of the pt's refusal to have labs drawn and her desire to leave the facility at this time. Close monitoring continued.

## 2022-02-04 ENCOUNTER — Other Ambulatory Visit: Payer: Self-pay | Admitting: Gastroenterology

## 2022-02-04 DIAGNOSIS — R195 Other fecal abnormalities: Secondary | ICD-10-CM

## 2022-02-04 DIAGNOSIS — R933 Abnormal findings on diagnostic imaging of other parts of digestive tract: Secondary | ICD-10-CM

## 2022-06-16 ENCOUNTER — Ambulatory Visit (INDEPENDENT_AMBULATORY_CARE_PROVIDER_SITE_OTHER): Payer: 59

## 2022-06-16 ENCOUNTER — Ambulatory Visit: Payer: 59 | Admitting: Internal Medicine

## 2022-06-16 VITALS — BP 132/78 | HR 103 | Ht 66.0 in | Wt 174.0 lb

## 2022-06-16 DIAGNOSIS — M5126 Other intervertebral disc displacement, lumbar region: Secondary | ICD-10-CM

## 2022-06-16 MED ORDER — PREDNISONE 50 MG PO TABS: 50.0000 mg | ORAL_TABLET | Freq: Every day | ORAL | 0 refills | Status: AC

## 2022-06-16 MED ORDER — TIZANIDINE HCL 4 MG PO TABS: 4.0000 mg | ORAL_TABLET | Freq: Three times a day (TID) | ORAL | 0 refills | Status: AC

## 2022-06-16 MED ORDER — NAPROXEN 500 MG PO TABS: 500.0000 mg | ORAL_TABLET | Freq: Two times a day (BID) | ORAL | 0 refills | Status: DC

## 2022-06-16 NOTE — Progress Notes (Signed)
Established Patient Office Visit  Subjective:  Patient ID: Shawna [name], female    DOB: 1965/10/26  Age: 57 y.o. MRN: [id number]  Chief Complaint  Patient presents with   Acute Visit    Back pain   Back Pain    Back pain    SUBJECTIVE:  Shawna [name] is a 57 y.o. female who complains of an injury causing low back pain on the right 10 day([place]) ago. The pain is positional with bending with radiation down the legs. Mechanism of injury: fall. Symptoms have been constant since that time. Prior history of back problems: recurrent self limited episodes of low back pain in the past. There is no numbness in the legs. Pain unrelieved by otc ibuprofen 200 mg but mild relief with topical lidocaine.        Past Medical History:  Diagnosis Date   Anxiety    Asthma    Bulging lumbar disc    Bulging lumbar disc    GERD (gastroesophageal reflux disease)    Insomnia    Kidney stone    Pneumonia DEC 2016   Sciatic leg pain    Right side   Sciatica of right side    Weakness    Bil knees, and ankles    Social History   Socioeconomic History   Marital status: Single    Spouse name: Not on file   Number of children: Not on file   Years of education: Not on file   Highest education level: Not on file  Occupational History   Not on file  Tobacco Use   Smoking status: Every Day    Packs/day: 1.00    Years: 40.00    Total pack years: 40.00    Types: Cigarettes   Smokeless tobacco: Current  Vaping Use   Vaping Use: Never used  Substance and Sexual Activity   Alcohol use: Not Currently    Comment: occ   Drug use: No   Sexual activity: Never  Other Topics Concern   Not on file  Social History Narrative   Not on file   Social Determinants of Health   Financial Resource Strain: Not on file  Food Insecurity: Not on file  Transportation Needs: Not on file  Physical Activity: Not on file  Stress: Not on file  Social Connections: Not on file  Intimate Partner Violence: Not on  file    Family History  Problem Relation Age of Onset   Fibromyalgia Mother     Allergies  Allergen Reactions   Haldol [Haloperidol]     Made pt "feel really weird".   Morphine And Related Nausea And Vomiting    Patient states this medication makes her have sever flu symptoms.    ROS     Objective:   BP 132/78   Pulse (!) 103   Ht 5' 6"$  (1.676 m)   Wt 174 lb (78.9 kg)   SpO2 95%   BMI 28.08 kg/m   Vitals:   06/16/22 0956  BP: 132/78  Pulse: (!) 103  Height: 5' 6"$  (1.676 m)  Weight: 174 lb (78.9 kg)  SpO2: 95%  BMI (Calculated): 28.1    Physical Exam OBJECTIVE: Vital signs as noted above. Patient appears to be in mild to moderate pain, antalgic gait noted. Lumbosacral spine area local tenderness and spasm of paraspinal muscles. Painful and reduced LS ROM noted. Straight leg raise is positive at 30 degrees on right. DTR'Filbert Craze, motor strength and sensation normal, including heel and  toe gait.  Peripheral pulses are palpable. Lumbar spine X-Ray: not indicated.   No results found for any visits on 06/16/22.  No results found for this or any previous visit (from the past 2160 hour(Kersti Scavone)).    Assessment & Plan:  ASSESSMENT:  lumbar strain and herniated disc likely at L4-5  Problem List Items Addressed This Visit   None Visit Diagnoses     Herniated lumbar intervertebral disc    -  Primary   Relevant Medications   predniSONE (DELTASONE) 50 MG tablet   tiZANidine (ZANAFLEX) 4 MG tablet   naproxen (NAPROSYN) 500 MG tablet   Other Relevant Orders   DG Lumbar Spine 2-3 Views     1. Herniated lumbar intervertebral disc - predniSONE (DELTASONE) 50 MG tablet; Take 1 tablet (50 mg total) by mouth daily with breakfast for 5 days.  Dispense: 5 tablet; Refill: 0 - tiZANidine (ZANAFLEX) 4 MG tablet; Take 1 tablet (4 mg total) by mouth 3 (three) times daily for 20 days.  Dispense: 60 tablet; Refill: 0 - naproxen (NAPROSYN) 500 MG tablet; Take 1 tablet (500 mg total) by mouth  2 (two) times daily with a meal.  Dispense: 60 tablet; Refill: 0 - DG Lumbar Spine 2-3 Views   PLAN: For acute pain, rest, intermittent application of cold packs (later, may switch to heat, but do not sleep on heating pad), analgesics and muscle relaxants are recommended. Discussed longer term treatment plan of prn NSAID'Marna Weniger and discussed a home back care exercise program with flexion exercise routine. Proper lifting with avoidance of heavy lifting discussed. Consider Physical Therapy if not improving. Call or return to clinic prn if these symptoms worsen or fail to improve as anticipated.  No follow-ups on file.   Total time spent: 30 minutes  Volanda Napoleon, MD  06/16/2022

## 2022-08-02 ENCOUNTER — Encounter (HOSPITAL_COMMUNITY): Payer: Self-pay

## 2022-08-02 ENCOUNTER — Ambulatory Visit (HOSPITAL_COMMUNITY)
Admission: EM | Admit: 2022-08-02 | Discharge: 2022-08-02 | Disposition: A | Discharge status: Other Acute Inpatient Hospital | Payer: No Typology Code available for payment source | Attending: Psychiatry | Admitting: Psychiatry

## 2022-08-02 ENCOUNTER — Emergency Department (HOSPITAL_COMMUNITY)
Admission: EM | Admit: 2022-08-02 | Discharge: 2022-08-04 | Disposition: A | Discharge status: Home / Self Care | Payer: No Typology Code available for payment source | Attending: Emergency Medicine | Admitting: Emergency Medicine

## 2022-08-02 ENCOUNTER — Encounter (HOSPITAL_COMMUNITY): Payer: Self-pay | Admitting: Psychiatry

## 2022-08-02 ENCOUNTER — Other Ambulatory Visit: Payer: Self-pay

## 2022-08-02 DIAGNOSIS — F332 Major depressive disorder, recurrent severe without psychotic features: Secondary | ICD-10-CM | POA: Diagnosis present

## 2022-08-02 DIAGNOSIS — R45851 Suicidal ideations: Secondary | ICD-10-CM | POA: Diagnosis not present

## 2022-08-02 DIAGNOSIS — R739 Hyperglycemia, unspecified: Secondary | ICD-10-CM | POA: Insufficient documentation

## 2022-08-02 DIAGNOSIS — F419 Anxiety disorder, unspecified: Secondary | ICD-10-CM | POA: Insufficient documentation

## 2022-08-02 DIAGNOSIS — T1491XA Suicide attempt, initial encounter: Secondary | ICD-10-CM | POA: Insufficient documentation

## 2022-08-02 DIAGNOSIS — E876 Hypokalemia: Secondary | ICD-10-CM | POA: Diagnosis not present

## 2022-08-02 DIAGNOSIS — J45909 Unspecified asthma, uncomplicated: Secondary | ICD-10-CM | POA: Insufficient documentation

## 2022-08-02 DIAGNOSIS — Z9151 Personal history of suicidal behavior: Secondary | ICD-10-CM | POA: Diagnosis not present

## 2022-08-02 DIAGNOSIS — T50902A Poisoning by unspecified drugs, medicaments and biological substances, intentional self-harm, initial encounter: Secondary | ICD-10-CM

## 2022-08-02 LAB — COMPREHENSIVE METABOLIC PANEL
ALT: 19 U/L (ref 0–44)
AST: 17 U/L (ref 15–41)
Albumin: 3.4 g/dL — ABNORMAL LOW (ref 3.5–5.0)
Alkaline Phosphatase: 87 U/L (ref 38–126)
Anion gap: 12 (ref 5–15)
BUN: 12 mg/dL (ref 6–20)
CO2: 21 mmol/L — ABNORMAL LOW (ref 22–32)
Calcium: 8.8 mg/dL — ABNORMAL LOW (ref 8.9–10.3)
Chloride: 106 mmol/L (ref 98–111)
Creatinine, Ser: 0.82 mg/dL (ref 0.44–1.00)
GFR, Estimated: >60 mL/min (ref >60)
Glucose, Bld: 171 mg/dL — ABNORMAL HIGH (ref 70–99)
Potassium: 3.1 mmol/L — ABNORMAL LOW (ref 3.5–5.1)
Sodium: 139 mmol/L (ref 135–145)
Total Bilirubin: 0.8 mg/dL (ref 0.3–1.2)
Total Protein: 6.2 g/dL — ABNORMAL LOW (ref 6.5–8.1)

## 2022-08-02 LAB — CBC WITH DIFFERENTIAL/PLATELET
Abs Immature Granulocytes: 0.02 10*3/uL (ref 0.00–0.07)
Basophils Absolute: 0 10*3/uL (ref 0.0–0.1)
Basophils Relative: 1 %
Eosinophils Absolute: 0.1 10*3/uL (ref 0.0–0.5)
Eosinophils Relative: 1 %
HCT: 35.7 % — ABNORMAL LOW (ref 36.0–46.0)
Hemoglobin: 11.9 g/dL — ABNORMAL LOW (ref 12.0–15.0)
Immature Granulocytes: 0 %
Lymphocytes Relative: 29 %
Lymphs Abs: 2.1 10*3/uL (ref 0.7–4.0)
MCH: 29.3 pg (ref 26.0–34.0)
MCHC: 33.3 g/dL (ref 30.0–36.0)
MCV: 87.9 fL (ref 80.0–100.0)
Monocytes Absolute: 0.5 10*3/uL (ref 0.1–1.0)
Monocytes Relative: 7 %
Neutro Abs: 4.6 10*3/uL (ref 1.7–7.7)
Neutrophils Relative %: 62 %
Platelets: 335 10*3/uL (ref 150–400)
RBC: 4.06 MIL/uL (ref 3.87–5.11)
RDW: 15 % (ref 11.5–15.5)
WBC: 7.3 10*3/uL (ref 4.0–10.5)
nRBC: 0 % (ref 0.0–0.2)

## 2022-08-02 LAB — SALICYLATE LEVEL: Salicylate Lvl: <7 mg/dL — ABNORMAL LOW (ref 7.0–30.0)

## 2022-08-02 LAB — ACETAMINOPHEN LEVEL: Acetaminophen (Tylenol), Serum: <10 ug/mL — ABNORMAL LOW (ref 10–30)

## 2022-08-02 LAB — ETHANOL: Alcohol, Ethyl (B): <10 mg/dL (ref <10)

## 2022-08-02 LAB — CBG MONITORING, ED: Glucose-Capillary: 165 mg/dL — ABNORMAL HIGH (ref 70–99)

## 2022-08-02 MED ORDER — SUCRALFATE 1 G PO TABS: 1.0000 g | ORAL_TABLET | Freq: Three times a day (TID) | ORAL | Status: DC

## 2022-08-02 MED ORDER — PANTOPRAZOLE SODIUM 40 MG PO TBEC
40.0000 mg | DELAYED_RELEASE_TABLET | Freq: Once | ORAL | Status: AC
  Administered 2022-08-02: 40 mg via ORAL
  Filled 2022-08-02: qty 1

## 2022-08-02 MED ORDER — HYDROXYZINE HCL 25 MG PO TABS: 50.0000 mg | ORAL_TABLET | Freq: Three times a day (TID) | ORAL | Status: DC | PRN

## 2022-08-02 MED ORDER — MAGNESIUM HYDROXIDE 400 MG/5ML PO SUSP: 30.0000 mL | Freq: Every day | ORAL | Status: DC | PRN

## 2022-08-02 MED ORDER — ALBUTEROL SULFATE HFA 108 (90 BASE) MCG/ACT IN AERS: 1.0000 | INHALATION_SPRAY | Freq: Four times a day (QID) | RESPIRATORY_TRACT | Status: DC | PRN

## 2022-08-02 MED ORDER — ALUM & MAG HYDROXIDE-SIMETH 200-200-20 MG/5ML PO SUSP: 30.0000 mL | ORAL | Status: DC | PRN

## 2022-08-02 MED ORDER — ROPINIROLE HCL 1 MG PO TABS: 2.0000 mg | ORAL_TABLET | Freq: Two times a day (BID) | ORAL | Status: DC

## 2022-08-02 MED ORDER — ACETAMINOPHEN 325 MG PO TABS: 650.0000 mg | ORAL_TABLET | Freq: Four times a day (QID) | ORAL | Status: DC | PRN

## 2022-08-02 MED ORDER — FAMOTIDINE 20 MG PO TABS: 20.0000 mg | ORAL_TABLET | Freq: Two times a day (BID) | ORAL | Status: DC

## 2022-08-02 MED ORDER — ONDANSETRON 4 MG PO TBDP: 4.0000 mg | ORAL_TABLET | Freq: Every day | ORAL | Status: DC | PRN

## 2022-08-02 NOTE — ED Notes (Signed)
Per pt, call daughter with updates on placement and treatment.

## 2022-08-02 NOTE — ED Provider Notes (Signed)
[place] [facility]  Provider Note   CSN: [id number] Arrival date & time: 08/02/22  1658     History  Chief Complaint  Patient presents with   Medical Clearance    Shawna FillersWendy A [name] is a 57 y.o. female.  HPI Presents from our affiliated behavioral health urgent care with concern for intentional ingestion, possible suicidal ideation. Patient self currently has no complaints.  She presents today after intentionally overdosing on clonazepam and Ambien 2 nights ago.  She took possibly as many as handful/100 of clonazepam, possibly 10 Ambien and possibly some Prozac. No syncope, no pain.  She has a long history of anxiety, depression, currently has no homicidal ideation. She is eventually joined by her daughter at bedside.    Home Medications Prior to Admission medications   Medication Sig Start Date End Date Taking? Authorizing Provider  acetaminophen (TYLENOL) 500 MG tablet Take 500 mg by mouth every 6 (six) hours as needed. Patient not taking: Reported on 12/26/2021    [provider]  albuterol (PROVENTIL HFA;VENTOLIN HFA) 108 (90 Base) MCG/ACT inhaler Inhale 2 puffs into the lungs every 6 (six) hours as needed for wheezing or shortness of breath. 01/16/16   Jennye MoccasinQuigley, Brian S, MD  aluminum-magnesium hydroxide-simethicone (MAALOX) 200-200-20 MG/5ML SUSP Take 30 mLs by mouth 4 (four) times daily -  before meals and at bedtime. 02/28/19   Sharman CheekStafford, Phillip, MD  amoxicillin (AMOXIL) 875 MG tablet Take 1 tablet (875 mg total) by mouth 2 (two) times daily. Patient not taking: Reported on 12/26/2021 11/02/20   Tommi RumpsSummers, Rhonda L, PA-C  clonazePAM (KLONOPIN) 0.5 MG tablet Take 0.5 mg by mouth 2 (two) times daily as needed for anxiety.    [provider]  dexlansoprazole (DEXILANT) 60 MG capsule Take 1 capsule by mouth daily. Patient not taking: Reported on 12/26/2021    [provider]  dicyclomine (BENTYL) 20 MG tablet Take 1 tablet (20 mg  total) by mouth 3 (three) times daily as needed for spasms. 03/04/19 12/26/21  Triplett, Rulon Eisenmengerari B, FNP  DULoxetine 40 MG CPEP Take 40 mg by mouth daily. Patient not taking: Reported on 12/26/2021 07/27/16   Pucilowska, Braulio ConteJolanta B, MD  famotidine (PEPCID) 20 MG tablet Take 1 tablet (20 mg total) by mouth 2 (two) times daily. 02/28/19   Sharman CheekStafford, Phillip, MD  fexofenadine (ALLEGRA) 180 MG tablet Take 180 mg by mouth daily.    [provider]  FLUoxetine (PROZAC) 40 MG capsule Take 40 mg by mouth daily.    [provider]  fluticasone (FLONASE) 50 MCG/ACT nasal spray Place 2 sprays into both nostrils daily.     [provider]  hydrOXYzine (ATARAX/VISTARIL) 50 MG tablet Take 50 mg by mouth every 6 (six) hours as needed.     [provider]  lidocaine (XYLOCAINE) 2 % solution Use as directed 15 mLs in the mouth or throat as needed for mouth pain. 04/13/19   Chesley NoonJessup, Charles, MD  metoCLOPramide (REGLAN) 10 MG tablet Take 1 tablet (10 mg total) by mouth every 6 (six) hours as needed. Patient not taking: Reported on 12/26/2021 02/28/19   Sharman CheekStafford, Phillip, MD  metoCLOPramide (REGLAN) 10 MG tablet Take 1 tablet (10 mg total) by mouth every 8 (eight) hours as needed for refractory nausea / vomiting. Patient not taking: Reported on 12/26/2021 04/18/19   Felicie MornSmith, David, NP  ondansetron (ZOFRAN) 4 MG tablet Take 1 tablet (4 mg total) by mouth every 8 (eight) hours as needed for nausea  or vomiting. 03/21/21   Dionne Bucy, MD  zolpidem (AMBIEN) 10 MG tablet Take 10 mg by mouth at bedtime as needed for sleep.    [provider]      Allergies    Haldol [haloperidol] and Morphine and related    Review of Systems   Review of Systems  All other systems reviewed and are negative.   Physical Exam Updated Vital Signs BP 112/74 (BP Location: Left Arm)   Pulse 61   Temp 98.7 F (37.1 C) (Oral)   Resp 18   Ht 5\' 6"  (1.676 m)   Wt 78 kg   SpO2 94%   BMI 27.75 kg/m   Physical Exam Vitals and nursing note reviewed.  Constitutional:      General: She is not in acute distress.    Appearance: She is well-developed.  HENT:     Head: Normocephalic and atraumatic.  Eyes:     Conjunctiva/sclera: Conjunctivae normal.  Cardiovascular:     Rate and Rhythm: Normal rate and regular rhythm.  Pulmonary:     Effort: Pulmonary effort is normal. No respiratory distress.     Breath sounds: Normal breath sounds. No stridor.  Abdominal:     General: There is no distension.  Skin:    General: Skin is warm and dry.  Neurological:     Mental Status: She is alert and oriented to person, place, and time.     Cranial Nerves: No cranial nerve deficit.  Psychiatric:        Mood and Affect: Mood normal.     ED Results / Procedures / Treatments   Labs (all labs ordered are listed, but only abnormal results are displayed) Labs Reviewed  COMPREHENSIVE METABOLIC PANEL - Abnormal; Notable for the following components:      Result Value   Potassium 3.1 (*)    CO2 21 (*)    Glucose, Bld 171 (*)    Calcium 8.8 (*)    Total Protein 6.2 (*)    Albumin 3.4 (*)    All other components within normal limits  SALICYLATE LEVEL - Abnormal; Notable for the following components:   Salicylate Lvl <7.0 (*)    All other components within normal limits  ACETAMINOPHEN LEVEL - Abnormal; Notable for the following components:   Acetaminophen (Tylenol), Serum <10 (*)    All other components within normal limits  CBC WITH DIFFERENTIAL/PLATELET - Abnormal; Notable for the following components:   Hemoglobin 11.9 (*)    HCT 35.7 (*)    All other components within normal limits  CBG MONITORING, ED - Abnormal; Notable for the following components:   Glucose-Capillary 165 (*)    All other components within normal limits  ETHANOL  RAPID URINE DRUG SCREEN, HOSP PERFORMED    EKG EKG Interpretation  Date/Time:  Sunday August 02 2022 17:25:47 EDT Ventricular Rate:  65 PR  Interval:  136 QRS Duration: 88 QT Interval:  444 QTC Calculation: 461 R Axis:   87 Text Interpretation: Normal sinus rhythm Low voltage QRS Nonspecific T wave abnormality Abnormal ECG Confirmed by Gerhard Munch [phone]) on 08/02/2022 5:35:03 PM  Radiology No results found.  Procedures Procedures    Medications Ordered in ED Medications - No data to display  ED Course/ Medical Decision Making/ A&P                             Medical Decision Making Female with history of anxiety, depression  presents after intentional overdose 2 days ago, now with concern for ongoing possible thoughts of self-harm. Patient has no physical complaints, given passage of 2 days there is low suspicion for toxic effects, but this is a consideration, patient was placed on monitors, labs started.   Amount and/or Complexity of Data Reviewed Independent Historian:     Details: Daughter External Data Reviewed: notes.    Details: Notes from behavioral health urgent care and I discussed the patient's case with those individuals Labs: ordered. Decision-making details documented in ED Course. ECG/medicine tests: ordered and independent interpretation performed. Decision-making details documented in ED Course.  Risk Prescription drug management. Decision regarding hospitalization.  On repeat exam patient is in similar condition, has been ambulatory, is in no distress.  Labs reviewed, unremarkable aside from mild hyperglycemia, mild hypokalemia.  ECG without arrhythmia, long QT, patient has no physical complaints, is medically cleared for to return to our behavioral health facility.  7:42 PM Patient in no distress, awake, alert.        Final Clinical Impression(s) / ED Diagnoses Final diagnoses:  Intentional overdose, initial encounter     Gerhard Munch, MD 08/02/22 1944

## 2022-08-02 NOTE — Progress Notes (Signed)
Inpatient Behavioral Health Placement  Pt meets inpatient criteria perJacqueline Waldo Laine, NP. There are no available beds within [facility] system per [facility] Antoinette Cillo, RN. CSW manually faxed referral to [facility] via fax due to facility incorrect number in EPIC. Referral was sent to the following facilities;   Destination  Service Provider Address Phone [facility]  [address] [place] [address] [phone]  [facility]  [address] [place] [address] [phone]  [facility]  [address], [place] [address] [phone]  [facility]  [address], [place] [address] [phone]  [facility]  [address], [place] [address] [phone]  [facility]  [address] [place] [address] [phone]  [facility]  [address], [place] [address] [phone]  [facility]  [address] [place] [address] [phone]  [facility]  [address] [place], [place] [address] [phone]  [facility]  [address] [place] [address] [phone]  [facility]  [address], [place] [address] [phone]  [facility]  [address] [place], [place] [address] [phone]  [facility]  [address] [place] [address] [phone]    Situation ongoing,  CSW will follow up.   Maryjean Ka, MSW, Theresia Majors

## 2022-08-02 NOTE — ED Notes (Signed)
Pt transferred to [facility] via [facility] non emergent transport. Report called to [facility] RN

## 2022-08-02 NOTE — BH Assessment (Signed)
Comprehensive Clinical Assessment (CCA) Note  08/02/2022 Joesph FillersWendy A Horne [id number]  Disposition: Per Darrick GrinderJacqueline Lee, NP, pt meets criteria for inpatient hospitalization.    The patient demonstrates the following risk factors for suicide: Chronic risk factors for suicide include: previous suicide attempts   . Acute risk factors for suicide include: loss (financial, interpersonal, professional). Protective factors for this patient include: positive social support. Considering these factors, the overall suicide risk at this point appears to be high. Patient is not appropriate for outpatient follow up.    Chief Complaint:  Chief Complaint  Patient presents with   Suicidal   Visit Diagnosis: Generalized Anxiety   Pt is a 57 yo  female [facility] by daugther due pt reporting  of intentional overdose done on Friday (07/31/22). Patient admits to intentional ingestion on Friday of 10 Ambien, 100 Klonopin and 30 Prozac. Pt reported that she immediately vomitted and called EMS. However, pt did not disclose the intentional overdose to EMS. Pt reports that she was feeling overwhelmed. Pt reported that July 27, 2022 she had to move out of her apartment and start living in a motel. Pt has history of visiting emergency department multiple times in the past week for abdominal pain and fall. Pt reports difficulty walking and currently using a cane. Pt currently denies SI, HI,AVH. Pt does not have a therapist at this time. Pt reports that she [facility] [place].   CCA Screening, Triage and Referral (STR)  Patient Reported Information How did you hear about us? Family/Friend  What Is the Reason for Your Visit/Call Today? Pt is a 57 yo  female [facility] by daugther due pt reporting  of intentional overdose done on Friday (07/31/22). Patient admits to intentional ingestion on Friday of 10 Ambien, 100 Klonopin and 30 Prozac. Pt reported that  she immediately vomitted and called EMS. However, pt did not disclose the intentional overdose to EMS. Pt reports that she was feeling overwhelmed. Pt reported that July 27, 2022 she had to move out of her apartment and start living in a motel. Pt has history of visiting emergency department multiple times in the past week for abdominal pain and fall. Pt reports difficulty walking and currently using a cane. Pt currently denies SI, HI,AVH. Pt does not have a therapist at this time. Pt reports that she [facility] [place].  Pt was casually dressed and fairly groomed. Pt was cooperative. Pt's speech, movement and thought content were delayed. Pt's mood was somewhat sad and a bit anxious with flat affect. Pt was oriented x 4   How Long Has This Been Causing You Problems? 1 wk - 1 month  What Do You Feel Would Help You the Most Today? Treatment for Depression or other mood problem; Stress Management   Have You Recently Had Any Thoughts About Hurting Yourself? Yes  Are You Planning to Commit Suicide/Harm Yourself At This time? No    Have you Recently Had Thoughts About Hurting Someone Karolee Ohslse? No  Are You Planning to Harm Someone at This Time? No  Explanation: Pt denies HI.   Have You Used Any Alcohol or Drugs in the Past 24 Hours? No  What Did You Use and How Much? n/a   Do You Currently Have a Therapist/Psychiatrist? Yes  Name of Therapist/Psychiatrist: Name of Therapist/Psychiatrist: Pt denies a therapist at this time. Pt reports that she [facility] [place].   Have  You Been Recently Discharged From Any Office Practice or Programs? No  Explanation of Discharge From Practice/Program: n/a  Flowsheet Row ED from [facility]02/20[facility] ED from 01/31/2022 in [facility] ED from 12/26/2021 in [facility] Emergency Department at [facility]  C-SSRS RISK CATEGORY High Risk No Risk No Risk          CCA Screening Triage Referral Assessment Type of Contact: Face-to-Face  Telemedicine Service Delivery:   Is this Initial or Reassessment?   Date Telepsych consult ordered in [facility]:    Time Telepsych consult ordered in [facility]:    Location of Assessment: [facility] Assessment Services  Provider Location: [facility] Assessment Services   Collateral Involvement: Pt's Daughter : Tammi Klippel [phone])   Does Patient Have a Court Appointed Legal Guardian? No  Legal Guardian Contact Information: n/a  Copy of Legal Guardianship Form: -- (n/a)  Legal Guardian Notified of Arrival: -- (n/a)  Legal Guardian Notified of Pending Discharge: -- (n/a)  If Minor and Not Living with Parent(s), Who has Custody? n/a  Is CPS involved or ever been involved? Never  Is APS involved or ever been involved? Never   Patient Determined To Be At Risk for Harm To Self or Others Based on Review of Patient Reported Information or Presenting Complaint? Yes, for Self-Harm  Method: No Plan  Availability of Means: No access or NA  Intent: Vague intent or NA  Notification Required: No need or identified person  Additional Information for Danger to Others Potential: Previous attempts  Additional Comments for Danger to Others Potential: Pt reports hx of SI attempts  Are There Guns or Other Weapons in Your Home? No  Types of Guns/Weapons: Pt denies access to guns/ weapons.  Are These Weapons Safely Secured?                            Yes (Pt denies access to guns/weapons)  Who Could Verify You Are Able To Have These Secured: n/a  Do You Have any Outstanding Charges, Pending Court Dates, Parole/Probation? Pt denies any legal issues.  Contacted To Inform of Risk of Harm To Self or Others: Other: Comment (n/a)    Does Patient Present under Involuntary Commitment? No    [place] of Residence: [place]   Patient Currently Receiving the Following Services: Medication Management   Determination of  Need: Urgent (48 hours)   Options For Referral: Inpatient Hospitalization     CCA Biopsychosocial Patient Reported Schizophrenia/Schizoaffective Diagnosis in Past: No   Strengths: NA   Mental Health Symptoms Depression:   Hopelessness; Worthlessness; Tearfulness   Duration of Depressive symptoms:  Duration of Depressive Symptoms: Greater than two weeks   Mania:   None   Anxiety:    Worrying   Psychosis:   None   Duration of Psychotic symptoms:    Trauma:   None   Obsessions:   None   Compulsions:   None   Inattention:   None   Hyperactivity/Impulsivity:   None   Oppositional/Defiant Behaviors:   None   Emotional Irregularity:   Chronic feelings of emptiness; Recurrent suicidal behaviors/gestures/threats   Other Mood/Personality Symptoms:   NA    Mental Status Exam Appearance and self-care  Stature:   Average   Weight:   Average weight   Clothing:   Casual   Grooming:   Normal (Pt presents with normal grooming today however, pt's daughter reports  that pt lacks taking care of ADLs.)   Cosmetic use:   None   Posture/gait:   Normal   Motor activity:   Slowed; Tremor   Sensorium  Attention:   Confused   Concentration:   Scattered   Orientation:   Situation; Time; Place; Person   Recall/memory:   Normal   Affect and Mood  Affect:   Anxious; Depressed; Flat   Mood:   Depressed; Anxious   Relating  Eye contact:   Fleeting   Facial expression:   Anxious; Sad   Attitude toward examiner:   Cooperative; Guarded   Thought and Language  Speech flow:  Slow   Thought content:   Appropriate to Mood and Circumstances   Preoccupation:   None   Hallucinations:   None   Organization:   Disorganized   Company secretary of Knowledge:   Fair   Intelligence:   Average   Abstraction:   Normal   Judgement:   Impaired   Reality Testing:   Unaware   Insight:   Gaps   Decision Making:    Impulsive   Social Functioning  Social Maturity:   Irresponsible   Social Judgement:   Normal   Stress  Stressors:   Housing; Transitions   Coping Ability:   Overwhelmed   Skill Deficits:   Activities of daily living; Decision making; Self-care; Responsibility   Supports:   Family     Religion: Religion/Spirituality Are You A Religious Person?: No How Might This Affect Treatment?: NA  Leisure/Recreation: Leisure / Recreation Do You Have Hobbies?: No  Exercise/Diet: Exercise/Diet Do You Exercise?: No Have You Gained or Lost A Significant Amount of Weight in the Past Six Months?: No Do You Follow a Special Diet?: No Do You Have Any Trouble Sleeping?: No   CCA Employment/Education Employment/Work Situation: Employment / Work Situation Employment Situation: On disability Why is Patient on Disability: Pt reports because of her mental health and health issues. How Long has Patient Been on Disability: NA Patient's Job has Been Impacted by Current Illness: No Has Patient ever Been in the Military?: No  Education: Education Is Patient Currently Attending School?: No Last Grade Completed: 12 Did You Attend College?: No Did You Have An Individualized Education Program (IIEP): No Did You Have Any Difficulty At School?: No Patient's Education Has Been Impacted by Current Illness: No   CCA Family/Childhood History Family and Relationship History: Family history Does patient have children?: Yes How many children?: 2 How is patient's relationship with their children?: NA  Childhood History:  Childhood History By whom was/is the patient raised?: Grandparents, Other (Comment) (Patient states she was raised by her grandmother and her aunt. ) Did patient suffer any verbal/emotional/physical/sexual abuse as a child?: No Did patient suffer from severe childhood neglect?: No Has patient ever been sexually abused/assaulted/raped as an adolescent or adult?: No Was  the patient ever a victim of a crime or a disaster?: No Witnessed domestic violence?: No Has patient been affected by domestic violence as an adult?: No       CCA Substance Use Alcohol/Drug Use: Alcohol / Drug Use Pain Medications: See MAR Prescriptions: See MAR Over the Counter: See MAR History of alcohol / drug use?: No history of alcohol / drug abuse Longest period of sobriety (when/how long): N/A (N/A) Negative Consequences of Use:  (n/a) Withdrawal Symptoms:  (n/a)  ASAM's:  Six Dimensions of Multidimensional Assessment  Dimension 1:  Acute Intoxication and/or Withdrawal Potential:      Dimension 2:  Biomedical Conditions and Complications:      Dimension 3:  Emotional, Behavioral, or Cognitive Conditions and Complications:     Dimension 4:  Readiness to Change:     Dimension 5:  Relapse, Continued use, or Continued Problem Potential:     Dimension 6:  Recovery/Living Environment:     ASAM Severity Score:    ASAM Recommended Level of Treatment:     Substance use Disorder (SUD)    Recommendations for Services/Supports/Treatments:    Discharge Disposition:    DSM5 Diagnoses: Patient Active Problem List   Diagnosis Date Noted   Visual hallucinations 07/30/2017   Colitis 01/22/2017   Dyslipidemia 07/26/2016   Low vitamin B12 level 07/26/2016   Major depressive disorder, recurrent severe without psychotic features 07/23/2016   Overdose of sedative or hypnotic 07/23/2016   COPD exacerbation 07/23/2016   GERD (gastroesophageal reflux disease) 07/23/2016   Cannabis use disorder, moderate, dependence 07/23/2016   Tobacco use disorder 07/23/2016   Sedative, hypnotic or anxiolytic use disorder, severe, dependence 07/23/2016   Bacterial pneumonia 01/23/2015   Leukocytosis 01/23/2015   Sepsis 01/23/2015   Hypoxemia 01/23/2015   Opioid use disorder, moderate, dependence 12/31/2014   Anxiety disorder, unspecified 12/31/2014      Referrals to Alternative Service(s): Referred to Alternative Service(s):   Place:   Date:   Time:    Referred to Alternative Service(s):   Place:   Date:   Time:    Referred to Alternative Service(s):   Place:   Date:   Time:    Referred to Alternative Service(s):   Place:   Date:   Time:     Dava Najjar, [place], [facility], [facility] Triage Specialist

## 2022-08-02 NOTE — ED Provider Notes (Cosign Needed Addendum)
Behavioral Health Urgent Care Medical Screening Exam  Patient Name: Shawna [name] MRN: [id number] Date of Evaluation: 08/02/22 Chief Complaint: "I took an overdose" Diagnosis:  Final diagnoses:  Severe episode of recurrent major depressive disorder, without psychotic features   History of Present illness:   Pt presents voluntarily to [facility] behavioral health for walk-in assessment.  Pt is accompanied by her daughter, Tonna Corner, who remains with pt throughout the assessment as per pt verbal consent/request. Pt is assessed face-to-face by nurse practitioner.    Joesph Fillers, 57 y.o., female patient seen face to face by this provider; and chart reviewed on 08/02/22.  Per chart review, pt w/ history of anxiety, asthma, bulging lumbar disc, gerd, insomnia, sciatica, weakness.   On evaluation, when asked reason for presenting today, Shawna [name] reports "I took an overdose". Pt reports taking "a little under 100 klonopin, 10 ambien, a not full bottle of prozac" on Friday night. She states she "projectile vomited" and then called EMS to check her vital signs. She reports she did not tell EMS she had overdosed and they left after checking vitals. Reports feeling weakness, although states this is baseline for her, and experiencing blurry vision earlier today. Pt reports she is depressed, anxious. She endorses passive suicidal ideations. She is unable to verbally contract to safety. She denies homicidal or violent ideations. She denies auditory visual hallucinations or paranoia.    Pt endorses use of 1.5 ppd of cigarettes, last cigarette was prior to arrival to facility. She denies alcohol, marijuana, crack/cocaine, methamphetamine, other substance use.   Pt is not connected with counseling. She receives medication management from the [facility].    Pt reports prior history of suicide attempts, inpatient psychiatric hospitalizations. She reports prior inpatient psychiatric  hospitalizations at [facility] and [facility].    Pt receives disability for physical and mental limitations.   Pt uses assistive ambulation device (cane). Pt reports she can bathe herself if shower has area to sit or with assistance. Pt reports she microwaves her meals. She does ambulate to the dollar store on her own with cane. Pt is incontinent and wears adult incontinence pads.   Per pt's daughter, Tonna Corner, pt has history of multiple suicide attempts. Pt was living in section 8 housing until earlier this month when landlord stopped taking section 8. Pt cannot live with her due to mental instability. She has safety concerns regarding pt given most recent suicide attempt and access to multiple medications. She believes pt would benefit from inpatient psychiatric hospitalization.    Pt is recommended for inpatient geri-psychiatry following medical clearance. Recommend TTS consult for psychiatric service line to follow pt. Pt to transfer to [facility] for medical clearance. Report given to Dr. Rodena Medin.  Flowsheet Row ED from 01/31/2022 in [facility] Emergency Department at [facility] ED from 12/26/2021 in [facility] Emergency Department at [facility] ED from 10/28/2021 in [facility] Emergency Department at [facility]  C-SSRS RISK CATEGORY No Risk No Risk No Risk       Psychiatric Specialty Exam  Presentation  General Appearance:Casual; Appropriate for Environment  Eye Contact:Minimal  Speech:Clear and Coherent; Normal Rate  Speech Volume:Decreased  Handedness:Right   Mood and Affect  Mood: Anxious; Depressed  Affect: Flat; Tearful   Thought Process  Thought Processes: Coherent; Goal Directed; Linear  Descriptions of Associations:Intact  Orientation:Full (Time, Place and Person)  Thought Content:Logical    Hallucinations:None  Ideas of Reference:None  Suicidal Thoughts:Yes, Passive  Homicidal Thoughts:No   Sensorium  Memory: Immediate  Good  Judgment: Intact  Insight: Present   Executive Functions  Concentration: Fair  Attention Span: Fair  Recall: [employer] of Knowledge: Fair  Language: Fair   Psychomotor Activity  Psychomotor Activity: Shuffling Gait   Assets  Assets: Communication Skills; Desire for Improvement; Financial Resources/Insurance; Resilience; Social Support   Sleep  Sleep: Poor  Number of hours: No data recorded  Physical Exam: Physical Exam Constitutional:      General: She is not in acute distress.    Appearance: She is not ill-appearing, toxic-appearing or diaphoretic.  Eyes:     General: No scleral icterus. Cardiovascular:     Rate and Rhythm: Normal rate.  Pulmonary:     Effort: Pulmonary effort is normal. No respiratory distress.  Neurological:     Mental Status: She is alert and oriented to person, place, and time.  Psychiatric:        Attention and Perception: Attention and perception normal.        Mood and Affect: Mood is anxious and depressed. Affect is flat and tearful.        Speech: Speech normal.        Behavior: Behavior normal. Behavior is cooperative.        Thought Content: Thought content includes suicidal ideation.        Cognition and Memory: Cognition and memory normal.    Review of Systems  Constitutional:  Negative for chills and fever.  Respiratory:  Negative for shortness of breath.   Cardiovascular:  Negative for chest pain and palpitations.  Gastrointestinal:  Negative for abdominal pain.  Neurological:  Negative for headaches.  Psychiatric/Behavioral:  Positive for depression and suicidal ideas. The patient is nervous/anxious.    Blood pressure (!) 143/77, pulse 86, temperature 98.6 F (37 C), resp. rate 19, SpO2 99 %. There is no height or weight on file to calculate BMI.  Musculoskeletal: Strength & Muscle Tone:  ambulates with cane Gait & Station: unsteady Patient leans:  ambulates with cane   [facility] MSE Discharge  Disposition for Follow up and Recommendations: Based on my evaluation the patient appears to have an emergency medical condition for which I recommend the patient be transferred to the emergency department for further evaluation.    Lauree Chandler, NP 08/02/2022, 4:24 PM

## 2022-08-02 NOTE — ED Triage Notes (Signed)
Patient bib [facility] from [facility] after an intentional OD on Friday night for medical clearance. She reported to have double vision this morning which has resolved. EMS reports VSS and A&Ox4.   Patient has a history COPD

## 2022-08-03 ENCOUNTER — Emergency Department (HOSPITAL_COMMUNITY): Payer: No Typology Code available for payment source

## 2022-08-03 DIAGNOSIS — F332 Major depressive disorder, recurrent severe without psychotic features: Secondary | ICD-10-CM

## 2022-08-03 DIAGNOSIS — T1491XA Suicide attempt, initial encounter: Secondary | ICD-10-CM | POA: Insufficient documentation

## 2022-08-03 LAB — URINALYSIS, COMPLETE (UACMP) WITH MICROSCOPIC
Bilirubin Urine: NEGATIVE
Glucose, UA: NEGATIVE mg/dL
Hgb urine dipstick: NEGATIVE
Ketones, ur: NEGATIVE mg/dL
Leukocytes,Ua: NEGATIVE
Nitrite: NEGATIVE
Protein, ur: NEGATIVE mg/dL
Specific Gravity, Urine: 1.018 (ref 1.005–1.030)
pH: 6 (ref 5.0–8.0)

## 2022-08-03 LAB — RAPID URINE DRUG SCREEN, HOSP PERFORMED
Amphetamines: NOT DETECTED
Barbiturates: NOT DETECTED
Benzodiazepines: POSITIVE — AB
Cocaine: NOT DETECTED
Opiates: NOT DETECTED
Tetrahydrocannabinol: POSITIVE — AB

## 2022-08-03 MED ORDER — ACETAMINOPHEN 325 MG PO TABS: 650.0000 mg | ORAL_TABLET | Freq: Four times a day (QID) | ORAL | Status: DC | PRN

## 2022-08-03 MED ORDER — PANTOPRAZOLE SODIUM 40 MG PO TBEC
40.0000 mg | DELAYED_RELEASE_TABLET | Freq: Every day | ORAL | Status: DC
  Administered 2022-08-03: 40 mg via ORAL
  Filled 2022-08-03: qty 1

## 2022-08-03 MED ORDER — ROPINIROLE HCL 1 MG PO TABS
2.0000 mg | ORAL_TABLET | Freq: Every day | ORAL | Status: DC
  Administered 2022-08-03: 2 mg via ORAL
  Filled 2022-08-03 (×2): qty 2

## 2022-08-03 MED ORDER — IBUPROFEN 400 MG PO TABS
400.0000 mg | ORAL_TABLET | Freq: Four times a day (QID) | ORAL | Status: DC | PRN
  Administered 2022-08-03: 400 mg via ORAL
  Filled 2022-08-03: qty 1

## 2022-08-03 MED ORDER — HYDROXYZINE HCL 25 MG PO TABS
25.0000 mg | ORAL_TABLET | Freq: Three times a day (TID) | ORAL | Status: DC | PRN
  Administered 2022-08-03 – 2022-08-04 (×3): 25 mg via ORAL
  Filled 2022-08-03 (×3): qty 1

## 2022-08-03 MED ORDER — NICOTINE 14 MG/24HR TD PT24
14.0000 mg | MEDICATED_PATCH | Freq: Every day | TRANSDERMAL | Status: DC | PRN
  Administered 2022-08-03: 14 mg via TRANSDERMAL
  Filled 2022-08-03: qty 1

## 2022-08-03 MED ORDER — FLUOXETINE HCL 20 MG PO CAPS
40.0000 mg | ORAL_CAPSULE | Freq: Every day | ORAL | Status: DC
  Administered 2022-08-03: 40 mg via ORAL
  Filled 2022-08-03: qty 2

## 2022-08-03 MED ORDER — ALBUTEROL SULFATE HFA 108 (90 BASE) MCG/ACT IN AERS
2.0000 | INHALATION_SPRAY | Freq: Once | RESPIRATORY_TRACT | Status: AC
  Administered 2022-08-03: 2 via RESPIRATORY_TRACT
  Filled 2022-08-03: qty 6.7

## 2022-08-03 MED ORDER — QUETIAPINE FUMARATE 50 MG PO TABS
50.0000 mg | ORAL_TABLET | Freq: Every day | ORAL | Status: DC
  Administered 2022-08-03: 50 mg via ORAL
  Filled 2022-08-03: qty 1

## 2022-08-03 NOTE — Progress Notes (Signed)
LCSW Progress Note  [id number]   Shawna [name]  08/03/2022  11:50 AM  Description:   Inpatient Psychiatric Referral  Patient was recommended inpatient per Eligha Bridegroom, NP. There are no available beds at [facility] or [facility] unit. Patient was referred to the following facilities:   Destination  Service Provider Address Phone Fax  [facility]  [address], [place] [address] [phone]  [facility]  [address] [place] [address] [phone]  [facility]  [address] [place] [address] [phone]  [facility]  [address] [place], [place] [address] [phone]  [facility] [address] [place]  [address] [place] [address] [phone]  [facility]  [address], [place] [address] [phone]  [facility]  [address] [place] [address] [phone]  [facility]  [address] [place] [address] [phone]  [facility]  [address] [place], [place] [address] [phone]  [facility]  [address] [place] [address] [phone]  [facility]  [address], [place] [address] [phone]  [facility]  [address], [place] [address] [phone]  [facility]  [address], [place] [address] [phone]  [facility]  [address] [place] [address] [phone]  [facility]  [address], [place] [address] [phone]  [facility]  [address] [place] [address] [phone]   [facility]  [address], [place] [address] [phone]  [facility]  [address], [place] [address] [phone]  [facility]  [address], [place] [address] [phone]  [facility]  [address], [place] [address] [phone]  [facility]  [address], [place] [address] [phone]  [facility]  [address], [place] [address] [phone]  [facility]  [address] [place] [address] [phone]  [facility]  [address] [place] [address] [phone]  [facility]  [address], [place] [address] [phone]  [facility]  [address] [place], [place] [address] [phone]  [facility]  [address] [place] [address] [phone]  [facility]  [address] [place] [address] [phone]  [facility]  [address] [place] [address] [phone]  [facility]  [address] [place] [address] [phone]    Situation ongoing, CSW to continue following and update chart as more information becomes available.      Cathie Beams, [place]  08/03/2022 11:50 AM

## 2022-08-03 NOTE — ED Provider Notes (Signed)
Blood pressure 118/70, pulse 76, temperature 98.8 F (37.1 C), temperature source Oral, resp. rate 18, height 5\' 6"  (1.676 m), weight 78 kg, SpO2 91 %.  Shawna [name] is a 57 y.o. female with a chief complaint of Medical Clearance .  Refer to the original H&P for additional details.  Patient accepted to [facility] to plan to transfer tomorrow (4/9).   Loni Beckwith, MD is the accepting physician.     Maia Plan, MD 08/03/22 1357

## 2022-08-03 NOTE — ED Notes (Signed)
Patients oxygen desatting into the low 80's. RN placed patient on oxygen. EDP notified.

## 2022-08-03 NOTE — Progress Notes (Addendum)
Pt was accepted to [facility] 08/04/20[facility]. Bed assignment: Main campus  Pt meets inpatient criteria per Eligha Bridegroom, NP  Attending Physician will be Loni Beckwith, MD  Report can be called to: [phone] (this is a pager, please leave call-back number when giving report)  Pt can arrive after 8 AM  Care Team Notified: Eligha Bridegroom, NP, Lovie Macadamia, RN, Rona Ravens, RN, and [address] LCSWA  [place], [place]  08/03/2022 1:16 PM

## 2022-08-03 NOTE — ED Notes (Signed)
Notified Dr. Elpidio Anis that the patient is requesting home medication for restless leg and PRN pain medication, Dr Elpidio Anis to review the patient's medication HX and enter new orders

## 2022-08-03 NOTE — ED Notes (Signed)
Update Daughter [phone] [place]

## 2022-08-03 NOTE — ED Provider Notes (Signed)
Patient awaiting placement.  Still unclear which facility will accept her.  She is resting comfortably.  She did have an episode of hypoxia while asleep.  X-ray was ordered which is unremarkable.  Patient denies any chest pain or shortness of breath or cough.  She reports she is a smoker.  Plan to give albuterol, but overall medically clear for behavioral health placement   Zadie Rhine, MD 08/03/22 [phone]

## 2022-08-03 NOTE — Consult Note (Signed)
BH ED ASSESSMENT   Reason for Consult:  SI Referring Physician:  Bebe Shaggy Patient Identification: Shawna [name] MRN:  [id number] ED Chief Complaint: Major depressive disorder, recurrent severe without psychotic features  Diagnosis:  Principal Problem:   Major depressive disorder, recurrent severe without psychotic features Active Problems:   Suicide attempt   ED Assessment Time Calculation: Start Time: 0900 Stop Time: 1000 Total Time in Minutes (Assessment Completion): 60   HPI:   Shawna [name] is a 57 y.o. female patient who originally presented to [facility] yesterday with her daughter after recent suicide attempt. Per documentation by [facility] provider, Shawna Lank, NP "On evaluation, when asked reason for presenting today, Shawna [name] reports "I took an overdose". Pt reports taking "a little under 100 klonopin, 10 ambien, a not full bottle of prozac" on Friday night. She states she "projectile vomited" and then called EMS to check her vital signs. She reports she did not tell EMS she had overdosed and they left after checking vitals. Reports feeling weakness, although states this is baseline for her, and experiencing blurry vision earlier today. Pt reports she is depressed, anxious. She endorses passive suicidal ideations. She is unable to verbally contract to safety. She denies homicidal or violent ideations. She denies auditory visual hallucinations or paranoia."  Pt has history of MDD and previous suicide attempts, most recent attempt around 3 years ago, attempted to overdose. Received inpatient hospitalization at [facility].   Subjective:   Patient seen at [facility] for face to face psychiatric evaluation. She is pleasant and engages in assessment. Confirms information above. She also reports many stresses such as possible homelessness. Pt was living in section 8 housing until earlier this month when landlord stopped taking section 8. She does have good relationship with her daughter, Shawna [name], however unable  to live with her due to mental instability. She is not currently connected with counseling but does have outpatient follow up with [facility]. She reports her only current psychotropic medication is Prozac 40mg .   She does confirm hx of depression and suicide attempts. She reports being compliant with medications. She continues to endorse suicidal ideations, no specific plan or intent at this time. Pt states "the only thing I've ever tried is overdosing so I guess I would just do that again." She is unable to contract for safety at this time, can not identify reasons to live. She denies homicidal ideations. Denies auditory or visual hallucinations. She does endorse sleep distrubance/insomnia. Denies illicit substance or alcohol use. Her daughter, Shawna [name], was present at [facility] presentation, who reported to NP that she has safety concerns due to recent suicide attempt and access to multiple medications. She does report her mother being more depressed than normal, and not at baseline.   Pt is now medically cleared. Will continue to recommend inpatient psychiatric treatment at this time. Pt continues to endorse passive SI, and unable to contract for safety. Pt does have a few barriers to inpatient treatment such as use of cane for walking/balance, and had episode of hypoxia last night which required albuterol and short O2 supplementation. She does not require daily/scheduled O2 maintenance. Overall she did confirm independence in ADL's such as toileting, shower, eating, etc. [facility] will review patient, if no available beds will have CSW fax out.   Past Psychiatric History:  See above   Risk to Self or Others: Is the patient at risk to self? Yes Has the patient been a risk to self in the past 6 months? Yes Has  the patient been a risk to self within the distant past? Yes Is the patient a risk to others? No Has the patient been a risk to others in the past 6 months? No Has the patient been a risk  to others within the distant past? No  [place] Scale:  Flowsheet Row ED from 08/02/2022 in [facility] Emergency Department at [facility] Most recent reading at 08/02/2022  5:07 PM ED from 08/02/2022 in [facility] Most recent reading at 08/02/2022  4:33 PM ED from 01/31/2022 in [facility] Emergency Department at [facility] Most recent reading at 01/31/2022  5:29 AM  C-SSRS RISK CATEGORY High Risk High Risk No Risk       Past Medical History:  Past Medical History:  Diagnosis Date   Anxiety    Asthma    Bulging lumbar disc    Bulging lumbar disc    GERD (gastroesophageal reflux disease)    Insomnia    Kidney stone    Pneumonia DEC 2016   Sciatic leg pain    Right side   Sciatica of right side    Weakness    Bil knees, and ankles    Past Surgical History:  Procedure Laterality Date   ABDOMINAL HYSTERECTOMY     Partial   ESOPHAGOGASTRODUODENOSCOPY (EGD) WITH PROPOFOL N/A 11/27/2015   Procedure: ESOPHAGOGASTRODUODENOSCOPY (EGD) WITH PROPOFOL;  Surgeon: Christena Deem, MD;  Location: [facility] ENDOSCOPY;  Service: Endoscopy;  Laterality: N/A;   SHOULDER SURGERY Right    TONSILLECTOMY     TUBAL LIGATION     Family History:  Family History  Problem Relation Age of Onset   Fibromyalgia Mother    Social History:  Social History   Substance and Sexual Activity  Alcohol Use Not Currently   Comment: occ     Social History   Substance and Sexual Activity  Drug Use No    Social History   Socioeconomic History   Marital status: Single    Spouse name: Not on file   Number of children: Not on file   Years of education: Not on file   Highest education level: Not on file  Occupational History   Not on file  Tobacco Use   Smoking status: Every Day    Packs/day: 1.00    Years: 40.00    Additional pack years: 0.00    Total pack years: 40.00    Types: Cigarettes   Smokeless tobacco: Current  Vaping Use   Vaping Use: Never used   Substance and Sexual Activity   Alcohol use: Not Currently    Comment: occ   Drug use: No   Sexual activity: Never  Other Topics Concern   Not on file  Social History Narrative   Not on file   Social Determinants of Health   Financial Resource Strain: Not on file  Food Insecurity: Not on file  Transportation Needs: Not on file  Physical Activity: Not on file  Stress: Not on file  Social Connections: Not on file   Allergies:   Allergies  Allergen Reactions   Haldol [Haloperidol]     Made pt "feel really weird".   Morphine And Related Nausea And Vomiting    Patient states this medication makes her have sever flu symptoms.    Labs:  Results for orders placed or performed during the hospital encounter of 08/02/22 (from the past 48 hour(s))  Comprehensive metabolic panel     Status: Abnormal   Collection Time:  08/02/22  5:28 PM  Result Value Ref Range   Sodium 139 135 - 145 mmol/L   Potassium 3.1 (L) 3.5 - 5.1 mmol/L   Chloride 106 98 - 111 mmol/L   CO2 21 (L) 22 - 32 mmol/L   Glucose, Bld 171 (H) 70 - 99 mg/dL    Comment: Glucose reference range applies only to samples taken after fasting for at least 8 hours.   BUN 12 6 - 20 mg/dL   Creatinine, Ser 1.61 0.44 - 1.00 mg/dL   Calcium 8.8 (L) 8.9 - 10.3 mg/dL   Total Protein 6.2 (L) 6.5 - 8.1 g/dL   Albumin 3.4 (L) 3.5 - 5.0 g/dL   AST 17 15 - 41 U/L   ALT 19 0 - 44 U/L   Alkaline Phosphatase 87 38 - 126 U/L   Total Bilirubin 0.8 0.3 - 1.2 mg/dL   GFR, Estimated >09 >60 mL/min    Comment: (NOTE) Calculated using the CKD-EPI Creatinine Equation (2021)    Anion gap 12 5 - 15    Comment: Performed at [facility], [address], [place], [place] [address]  Salicylate level     Status: Abnormal   Collection Time: 08/02/22  5:28 PM  Result Value Ref Range   Salicylate Lvl <7.0 (L) 7.0 - 30.0 mg/dL    Comment: Performed at [facility], [address] [place], [place] [address]  Acetaminophen level      Status: Abnormal   Collection Time: 08/02/22  5:28 PM  Result Value Ref Range   Acetaminophen (Tylenol), Serum <10 (L) 10 - 30 ug/mL    Comment: (NOTE) Therapeutic concentrations vary significantly. A range of 10-30 ug/mL  may be an effective concentration for many patients. However, some  are best treated at concentrations outside of this range. Acetaminophen concentrations >150 ug/mL at 4 hours after ingestion  and >50 ug/mL at 12 hours after ingestion are often associated with  toxic reactions.  Performed at [facility], [address] [place], [place] [address]   Ethanol     Status: None   Collection Time: 08/02/22  5:28 PM  Result Value Ref Range   Alcohol, Ethyl (B) <10 <10 mg/dL    Comment: (NOTE) Lowest detectable limit for serum alcohol is 10 mg/dL.  For medical purposes only. Performed at [facility], [address] [place], [place] [address]   CBC WITH DIFFERENTIAL     Status: Abnormal   Collection Time: 08/02/22  5:28 PM  Result Value Ref Range   WBC 7.3 4.0 - 10.5 K/uL   RBC 4.06 3.87 - 5.11 MIL/uL   Hemoglobin 11.9 (L) 12.0 - 15.0 g/dL   HCT 08.6 (L) 57.8 - 46.9 %   MCV 87.9 80.0 - 100.0 fL   MCH 29.3 26.0 - 34.0 pg   MCHC 33.3 30.0 - 36.0 g/dL   RDW 62.9 52.8 - 41.3 %   Platelets 335 150 - 400 K/uL   nRBC 0.0 0.0 - 0.2 %   Neutrophils Relative % 62 %   Neutro Abs 4.6 1.7 - 7.7 K/uL   Lymphocytes Relative 29 %   Lymphs Abs 2.1 0.7 - 4.0 K/uL   Monocytes Relative 7 %   Monocytes Absolute 0.5 0.1 - 1.0 K/uL   Eosinophils Relative 1 %   Eosinophils Absolute 0.1 0.0 - 0.5 K/uL   Basophils Relative 1 %   Basophils Absolute 0.0 0.0 - 0.1 K/uL   Immature  Granulocytes 0 %   Abs Immature Granulocytes 0.02 0.00 - 0.07 K/uL    Comment: Performed at [facility], [address] [place], [place] [id number]  CBG monitoring, ED     Status: Abnormal   Collection Time: 08/02/22  5:38 PM  Result Value Ref Range   Glucose-Capillary 165 (H) 70 -  99 mg/dL    Comment: Glucose reference range applies only to samples taken after fasting for at least 8 hours.    Current Facility-Administered Medications  Medication Dose Route Frequency Provider Last Rate Last Admin   FLUoxetine (PROZAC) capsule 40 mg  40 mg Oral Daily Zadie RhineWickline, Donald, MD       Current Outpatient Medications  Medication Sig Dispense Refill   acetaminophen (TYLENOL) 500 MG tablet Take 500 mg by mouth every 6 (six) hours as needed. (Patient not taking: Reported on 12/26/2021)     albuterol (PROVENTIL HFA;VENTOLIN HFA) 108 (90 Base) MCG/ACT inhaler Inhale 2 puffs into the lungs every 6 (six) hours as needed for wheezing or shortness of breath. 1 Inhaler 2   aluminum-magnesium hydroxide-simethicone (MAALOX) 200-200-20 MG/5ML SUSP Take 30 mLs by mouth 4 (four) times daily -  before meals and at bedtime. 355 mL 0   clonazePAM (KLONOPIN) 0.5 MG tablet Take 0.5 mg by mouth 2 (two) times daily as needed for anxiety.     dexlansoprazole (DEXILANT) 60 MG capsule Take 1 capsule by mouth daily. (Patient not taking: Reported on 12/26/2021)     dicyclomine (BENTYL) 20 MG tablet Take 1 tablet (20 mg total) by mouth 3 (three) times daily as needed for spasms. 30 tablet 0   DULoxetine 40 MG CPEP Take 40 mg by mouth daily. (Patient not taking: Reported on 12/26/2021) 30 capsule 1   famotidine (PEPCID) 20 MG tablet Take 1 tablet (20 mg total) by mouth 2 (two) times daily. 60 tablet 0   fexofenadine (ALLEGRA) 180 MG tablet Take 180 mg by mouth daily.     FLUoxetine (PROZAC) 40 MG capsule Take 40 mg by mouth daily.     fluticasone (FLONASE) 50 MCG/ACT nasal spray Place 2 sprays into both nostrils daily.      hydrOXYzine (ATARAX/VISTARIL) 50 MG tablet Take 50 mg by mouth every 6 (six) hours as needed.      lidocaine (XYLOCAINE) 2 % solution Use as directed 15 mLs in the mouth or throat as needed for mouth pain. 100 mL 0   metoCLOPramide (REGLAN) 10 MG tablet Take 1 tablet (10 mg total) by mouth every  6 (six) hours as needed. (Patient not taking: Reported on 12/26/2021) 30 tablet 0   metoCLOPramide (REGLAN) 10 MG tablet Take 1 tablet (10 mg total) by mouth every 8 (eight) hours as needed for refractory nausea / vomiting. (Patient not taking: Reported on 12/26/2021) 20 tablet 0   ondansetron (ZOFRAN) 4 MG tablet Take 1 tablet (4 mg total) by mouth every 8 (eight) hours as needed for nausea or vomiting. 12 tablet 0   zolpidem (AMBIEN) 10 MG tablet Take 10 mg by mouth at bedtime as needed for sleep.     Psychiatric Specialty Exam: Presentation  General Appearance:  Fairly Groomed  Eye Contact: Fair  Speech: Clear and Coherent  Speech Volume: Normal  Handedness: Right   Mood and Affect  Mood: Anxious; Depressed  Affect: Congruent   Thought Process  Thought Processes: Coherent  Descriptions of Associations:Intact  Orientation:Full (Time, Place and Person)  Thought Content:Logical  History of Schizophrenia/Schizoaffective disorder:No  Duration  of Psychotic Symptoms:No data recorded Hallucinations:Hallucinations: None  Ideas of Reference:None  Suicidal Thoughts:Suicidal Thoughts: Yes, Passive  Homicidal Thoughts:Homicidal Thoughts: No   Sensorium  Memory: Immediate Fair; Recent Fair  Judgment: Fair  Insight: Fair   Art therapist  Concentration: Fair  Attention Span: Fair  Recall: Good  Fund of Knowledge: Good  Language: Good   Psychomotor Activity  Psychomotor Activity: Psychomotor Activity: Shuffling Gait (cane for assistance)   Assets  Assets: Communication Skills; Desire for Improvement; Financial Resources/Insurance; Resilience; Social Support    Sleep  Sleep: Sleep: Poor   Physical Exam: Physical Exam Neurological:     Mental Status: She is alert and oriented to person, place, and time.  Psychiatric:        Attention and Perception: Attention normal.        Mood and Affect: Mood is depressed.        Speech:  Speech normal.        Behavior: Behavior is cooperative.        Thought Content: Thought content includes suicidal ideation.    Review of Systems  Constitutional:        Reports general weakness, uses cane for walking assistance/balance  Psychiatric/Behavioral:  Positive for depression and suicidal ideas. The patient is nervous/anxious and has insomnia.    Blood pressure 106/62, pulse 61, temperature 98.3 F (36.8 C), resp. rate 18, height 5\' 6"  (1.676 m), weight 78 kg, SpO2 95 %. Body mass index is 27.75 kg/m.  Medical Decision Making: Pt case reviewed and discussed with Dr. Lucianne Muss. Will continue to recommend inpatient psychiatric treatment. Pt will be reviewed for [facility], if no availability will have CSW fax out.   - Continue Prozac 40mg   - Start Seroquel 50 mg for sleep disturbance and mood   Disposition: Recommend psychiatric Inpatient admission when medically cleared.  Eligha Bridegroom, NP 08/03/2022 9:27 AM

## 2022-08-03 NOTE — ED Notes (Signed)
Patient transported to x-ray. ?

## 2022-08-03 NOTE — ED Notes (Signed)
Assumed care of pt in hallway brought here from [facility] after intentional OD. Pt dressed in purple scrubs per policy. Patient has been medically cleared but was advised she would not be accepted back to [facility]. Patient currently resting comfortably respirations even and non labored vs wnl  sitter at bedside. Will continue to monitor per policy

## 2022-08-04 NOTE — ED Notes (Addendum)
Transfer of care report given to Janett Labella, RN at [facility].   Safe transport called for pt transport.

## 2022-08-04 NOTE — ED Notes (Addendum)
PT well appearing upon transfer to [facility]. All belongings and paperwork sent with patient's transporter.

## 2022-08-04 NOTE — ED Notes (Signed)
This RN assumed care of patient. NAD noted, respirations are equal bilaterally and unlabored at this time. Pt sleeping in gurney a this time. Sitter at bedside.

## 2022-08-04 NOTE — ED Notes (Signed)
Pt made aware of transport and ambulated to bathroom with cane. NAD noted, respirations are equal bilaterally and unlabored at this time. Pt sleeping in gurney a this time. Sitter at bedside.

## 2022-08-04 NOTE — ED Provider Notes (Signed)
Emergency Medicine Observation Re-evaluation Note  Shawna [name] is a 57 y.o. female with hx of depresion, seen on rounds today.  Pt initially presented to the ED for complaints of Medical Clearance after suicide attempt with pills Currently, the patient is not having any acute complaints.  Physical Exam  BP 100/73 (BP Location: Left Arm)   Pulse 77   Temp 98.4 F (36.9 C) (Oral)   Resp 16   Ht 5\' 6"  (1.676 m)   Wt 78 kg   SpO2 94%   BMI 27.75 kg/m  Physical Exam General: Resting comfortably in stretcher Lungs: Normal work of breathing Psych: Calm  ED Course / MDM  EKG:EKG Interpretation  Date/Time:  Sunday August 02 2022 17:25:47 EDT Ventricular Rate:  65 PR Interval:  136 QRS Duration: 88 QT Interval:  444 QTC Calculation: 461 R Axis:   87 Text Interpretation: Normal sinus rhythm Low voltage QRS Nonspecific T wave abnormality Abnormal ECG Confirmed by Gerhard Munch [phone]) on 08/02/2022 5:35:03 PM  I have reviewed the labs performed to date as well as medications administered while in observation.  Recent changes in the last 24 hours include patient accepted to [facility] with anticipated transfer today.  Plan  Current plan is for placement and transfer to [facility] today.    Rondel Baton, MD 08/04/22 [phone]

## 2022-10-04 ENCOUNTER — Other Ambulatory Visit: Payer: Self-pay

## 2022-10-04 ENCOUNTER — Emergency Department
Admission: EM | Admit: 2022-10-04 | Discharge: 2022-10-04 | Disposition: A | Discharge status: Home / Self Care | Payer: 59 | Attending: Emergency Medicine | Admitting: Emergency Medicine

## 2022-10-04 ENCOUNTER — Emergency Department: Payer: 59

## 2022-10-04 DIAGNOSIS — E876 Hypokalemia: Secondary | ICD-10-CM | POA: Diagnosis not present

## 2022-10-04 DIAGNOSIS — R197 Diarrhea, unspecified: Secondary | ICD-10-CM | POA: Insufficient documentation

## 2022-10-04 DIAGNOSIS — R112 Nausea with vomiting, unspecified: Secondary | ICD-10-CM | POA: Insufficient documentation

## 2022-10-04 DIAGNOSIS — R1012 Left upper quadrant pain: Secondary | ICD-10-CM | POA: Diagnosis not present

## 2022-10-04 LAB — COMPREHENSIVE METABOLIC PANEL
ALT: 19 U/L (ref 0–44)
AST: 24 U/L (ref 15–41)
Albumin: 3.8 g/dL (ref 3.5–5.0)
Alkaline Phosphatase: 81 U/L (ref 38–126)
Anion gap: 11 (ref 5–15)
BUN: 8 mg/dL (ref 6–20)
CO2: 22 mmol/L (ref 22–32)
Calcium: 8.4 mg/dL — ABNORMAL LOW (ref 8.9–10.3)
Chloride: 107 mmol/L (ref 98–111)
Creatinine, Ser: 0.83 mg/dL (ref 0.44–1.00)
GFR, Estimated: >60 mL/min (ref >60)
Glucose, Bld: 145 mg/dL — ABNORMAL HIGH (ref 70–99)
Potassium: 3.1 mmol/L — ABNORMAL LOW (ref 3.5–5.1)
Sodium: 140 mmol/L (ref 135–145)
Total Bilirubin: 0.9 mg/dL (ref 0.3–1.2)
Total Protein: 6.7 g/dL (ref 6.5–8.1)

## 2022-10-04 LAB — URINALYSIS, ROUTINE W REFLEX MICROSCOPIC
Bilirubin Urine: NEGATIVE
Glucose, UA: NEGATIVE mg/dL
Hgb urine dipstick: NEGATIVE
Ketones, ur: NEGATIVE mg/dL
Nitrite: NEGATIVE
Protein, ur: 30 mg/dL — AB
Specific Gravity, Urine: 1.016 (ref 1.005–1.030)
pH: 6 (ref 5.0–8.0)

## 2022-10-04 LAB — CBC
HCT: 39.1 % (ref 36.0–46.0)
Hemoglobin: 13 g/dL (ref 12.0–15.0)
MCH: 29.5 pg (ref 26.0–34.0)
MCHC: 33.2 g/dL (ref 30.0–36.0)
MCV: 88.9 fL (ref 80.0–100.0)
Platelets: 312 10*3/uL (ref 150–400)
RBC: 4.4 MIL/uL (ref 3.87–5.11)
RDW: 14.2 % (ref 11.5–15.5)
WBC: 11.2 10*3/uL — ABNORMAL HIGH (ref 4.0–10.5)
nRBC: 0 % (ref 0.0–0.2)

## 2022-10-04 LAB — LIPASE, BLOOD: Lipase: 21 U/L (ref 11–51)

## 2022-10-04 MED ORDER — POTASSIUM CHLORIDE CRYS ER 20 MEQ PO TBCR
40.0000 meq | EXTENDED_RELEASE_TABLET | Freq: Once | ORAL | Status: AC
  Administered 2022-10-04: 40 meq via ORAL
  Filled 2022-10-04: qty 2

## 2022-10-04 MED ORDER — IOHEXOL 300 MG/ML  SOLN
100.0000 mL | Freq: Once | INTRAMUSCULAR | Status: AC | PRN
  Administered 2022-10-04: 100 mL via INTRAVENOUS

## 2022-10-04 MED ORDER — ONDANSETRON HCL 4 MG/2ML IJ SOLN
4.0000 mg | Freq: Once | INTRAMUSCULAR | Status: AC
  Administered 2022-10-04: 4 mg via INTRAVENOUS
  Filled 2022-10-04: qty 2

## 2022-10-04 NOTE — ED Triage Notes (Signed)
Pt presents to the ED due to abdominal pain that started 5 days ago. Pt states she is having NVD. pT a&oX4

## 2022-10-04 NOTE — ED Provider Notes (Signed)
[facility] Provider Note    Event Date/Time   First MD Initiated Contact with Patient 10/04/22 2020     (approximate)   History   Emesis and Abdominal Pain   HPI  RAMONITA CHEREK is a 57 y.o. female who presents to the emergency department today because of concerns for nausea vomiting abdominal pain.  Symptoms started 5 days ago.  Abdominal pain is located throughout her abdomen.  She did try taking some Zofran she had at home which temporarily relieved some of her symptoms however they would come back.  She denies any unusual ingestions.  No known sick contacts.  She denies any fevers.     Physical Exam   Triage Vital Signs: ED Triage Vitals  Enc Vitals Group     BP 10/04/22 1849 117/81     Pulse Rate 10/04/22 1849 89     Resp 10/04/22 1849 18     Temp 10/04/22 1849 98.6 F (37 C)     Temp Source 10/04/22 1849 Oral     SpO2 10/04/22 1849 92 %     Weight 10/04/22 1850 175 lb (79.4 kg)     Height --      Head Circumference --      Peak Flow --      Pain Score 10/04/22 1849 8     Pain Loc --      Pain Edu? --      Excl. in GC? --     Most recent vital signs: Vitals:   10/04/22 1849  BP: 117/81  Pulse: 89  Resp: 18  Temp: 98.6 F (37 C)  SpO2: 92%   General: Awake, alert, oriented. CV:  Good peripheral perfusion. Regular rate and rhythm. Resp:  Normal effort. Lungs clear. Abd:  No distention. Tender to palpation in the left upper quadrant.   ED Results / Procedures / Treatments   Labs (all labs ordered are listed, but only abnormal results are displayed) Labs Reviewed  COMPREHENSIVE METABOLIC PANEL - Abnormal; Notable for the following components:      Result Value   Potassium 3.1 (*)    Glucose, Bld 145 (*)    Calcium 8.4 (*)    All other components within normal limits  CBC - Abnormal; Notable for the following components:   WBC 11.2 (*)    All other components within normal limits  URINALYSIS, ROUTINE W REFLEX MICROSCOPIC  - Abnormal; Notable for the following components:   Color, Urine YELLOW (*)    APPearance HAZY (*)    Protein, ur 30 (*)    Leukocytes,Ua TRACE (*)    Bacteria, UA RARE (*)    All other components within normal limits  LIPASE, BLOOD     EKG  None   RADIOLOGY I independently interpreted and visualized the CT abd/pel. My interpretation: No free air. No free fluid. Radiology interpretation:  IMPRESSION:  No bowel obstruction, free air or free fluid.  Normal appendix.    Fatty liver infiltration.    Nonobstructing lower pole left-sided renal stone.    Small hiatal hernia.    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 There is also some early collapsed changes  along the right side.    Interval development compression of the inferior endplate of L3  compared to the study of 2020. Please correlate for age and etiology  of this lesion.      PROCEDURES:  Critical Care performed: No   MEDICATIONS  ORDERED IN ED: Medications - No data to display   IMPRESSION / MDM / ASSESSMENT AND PLAN / ED COURSE  I reviewed the triage vital signs and the nursing notes.                              Differential diagnosis includes, but is not limited to, diverticulitis, gastroenteritis, pancreatitis  Patient's presentation is most consistent with acute presentation with potential threat to life or bodily function.  Patient presented to the emergency department today because of concerns for nausea vomiting diarrhea and of some abdominal pain.  Patient's workup without any concerning leukocytosis.  Slight hypokalemia which is likely secondary to GI loss.  CT scan was obtained which did not show any concerning intra-abdominal infection.  At this time I think is reasonable for patient be discharged.  I think likely viral infection. Patient states she has zofran at home.    FINAL CLINICAL IMPRESSION(S) / ED DIAGNOSES   Final diagnoses:  Nausea vomiting  and diarrhea     Note:  This document was prepared using Dragon voice recognition software and may include unintentional dictation errors.    Phineas Semen, MD 10/04/22 2156

## 2022-10-04 NOTE — Discharge Instructions (Signed)
Please seek medical attention for any high fevers, chest pain, shortness of breath, change in behavior, persistent vomiting, bloody stool or any other new or concerning symptoms.  

## 2022-10-13 ENCOUNTER — Emergency Department (HOSPITAL_COMMUNITY): Payer: 59

## 2022-10-13 ENCOUNTER — Encounter (HOSPITAL_COMMUNITY): Payer: Self-pay | Admitting: *Deleted

## 2022-10-13 ENCOUNTER — Emergency Department (HOSPITAL_COMMUNITY)
Admission: EM | Admit: 2022-10-13 | Discharge: 2022-10-13 | Disposition: A | Discharge status: Home / Self Care | Payer: 59 | Attending: Emergency Medicine | Admitting: Emergency Medicine

## 2022-10-13 DIAGNOSIS — R079 Chest pain, unspecified: Secondary | ICD-10-CM | POA: Insufficient documentation

## 2022-10-13 DIAGNOSIS — R112 Nausea with vomiting, unspecified: Secondary | ICD-10-CM | POA: Insufficient documentation

## 2022-10-13 DIAGNOSIS — Z7951 Long term (current) use of inhaled steroids: Secondary | ICD-10-CM | POA: Insufficient documentation

## 2022-10-13 DIAGNOSIS — J45909 Unspecified asthma, uncomplicated: Secondary | ICD-10-CM | POA: Insufficient documentation

## 2022-10-13 LAB — COMPREHENSIVE METABOLIC PANEL
ALT: 17 U/L (ref 0–44)
AST: 22 U/L (ref 15–41)
Albumin: 3.5 g/dL (ref 3.5–5.0)
Alkaline Phosphatase: 82 U/L (ref 38–126)
Anion gap: 12 (ref 5–15)
BUN: 6 mg/dL (ref 6–20)
CO2: 23 mmol/L (ref 22–32)
Calcium: 8.4 mg/dL — ABNORMAL LOW (ref 8.9–10.3)
Chloride: 106 mmol/L (ref 98–111)
Creatinine, Ser: 0.83 mg/dL (ref 0.44–1.00)
GFR, Estimated: >60 mL/min (ref >60)
Glucose, Bld: 154 mg/dL — ABNORMAL HIGH (ref 70–99)
Potassium: 3.2 mmol/L — ABNORMAL LOW (ref 3.5–5.1)
Sodium: 141 mmol/L (ref 135–145)
Total Bilirubin: 0.8 mg/dL (ref 0.3–1.2)
Total Protein: 6 g/dL — ABNORMAL LOW (ref 6.5–8.1)

## 2022-10-13 LAB — CBC
HCT: 39.5 % (ref 36.0–46.0)
Hemoglobin: 12.9 g/dL (ref 12.0–15.0)
MCH: 28.5 pg (ref 26.0–34.0)
MCHC: 32.7 g/dL (ref 30.0–36.0)
MCV: 87.4 fL (ref 80.0–100.0)
Platelets: 333 10*3/uL (ref 150–400)
RBC: 4.52 MIL/uL (ref 3.87–5.11)
RDW: 13.9 % (ref 11.5–15.5)
WBC: 12.1 10*3/uL — ABNORMAL HIGH (ref 4.0–10.5)
nRBC: 0 % (ref 0.0–0.2)

## 2022-10-13 LAB — LIPASE, BLOOD: Lipase: 23 U/L (ref 11–51)

## 2022-10-13 LAB — TROPONIN I (HIGH SENSITIVITY)
Troponin I (High Sensitivity): 4 ng/L (ref <18)
Troponin I (High Sensitivity): 5 ng/L (ref <18)

## 2022-10-13 MED ORDER — SODIUM CHLORIDE 0.9 % IV BOLUS
500.0000 mL | Freq: Once | INTRAVENOUS | Status: AC
  Administered 2022-10-13: 500 mL via INTRAVENOUS

## 2022-10-13 MED ORDER — ONDANSETRON HCL 4 MG/2ML IJ SOLN
4.0000 mg | Freq: Once | INTRAMUSCULAR | Status: AC
  Administered 2022-10-13: 4 mg via INTRAVENOUS
  Filled 2022-10-13: qty 2

## 2022-10-13 MED ORDER — FENTANYL CITRATE PF 50 MCG/ML IJ SOSY
50.0000 ug | PREFILLED_SYRINGE | Freq: Once | INTRAMUSCULAR | Status: AC
  Administered 2022-10-13: 50 ug via INTRAVENOUS
  Filled 2022-10-13: qty 1

## 2022-10-13 MED ORDER — FAMOTIDINE IN NACL 20-0.9 MG/50ML-% IV SOLN
20.0000 mg | Freq: Once | INTRAVENOUS | Status: AC
  Administered 2022-10-13: 20 mg via INTRAVENOUS
  Filled 2022-10-13: qty 50

## 2022-10-13 MED ORDER — ONDANSETRON HCL 4 MG PO TABS: 4.0000 mg | ORAL_TABLET | Freq: Three times a day (TID) | ORAL | 0 refills | Status: DC | PRN

## 2022-10-13 MED ORDER — DIPHENHYDRAMINE HCL 50 MG/ML IJ SOLN
12.5000 mg | Freq: Once | INTRAMUSCULAR | Status: AC
  Administered 2022-10-13: 12.5 mg via INTRAVENOUS
  Filled 2022-10-13: qty 1

## 2022-10-13 MED ORDER — METOCLOPRAMIDE HCL 5 MG/ML IJ SOLN
5.0000 mg | Freq: Once | INTRAMUSCULAR | Status: AC
  Administered 2022-10-13: 5 mg via INTRAVENOUS
  Filled 2022-10-13: qty 2

## 2022-10-13 NOTE — ED Triage Notes (Addendum)
BIB [facility] from [facility], for CP x1d, onset yesterday, also NV. V x 10 since yesterday. Here last week for the same. Pinpoints to central chest, no radiation. Initial BP 160/90, down to 120/70 after ntg 1, ASA 324 and zofran 4. Rates 9/10. NSL 20g  L hand. Alert, NAD, calm. EKG unremarkable per EMS. Denies diarrhea, cough, fever, sob.

## 2022-10-13 NOTE — ED Provider Notes (Signed)
[place] EMERGENCY DEPARTMENT AT [facility] Provider Note   CSN: [id number] Arrival date & time: 10/13/22  1500     History  Chief Complaint  Patient presents with   Chest Pain    Shawna [name] is a 57 y.o. female.  Patient here with chest pain, nausea and vomiting.  Several episodes of emesis today.  Discomfort is mostly in the epigastrium up to her upper chest.  History of weakness, asthma, acid reflux.  Denies any diarrhea.  No fever or chills.  No exertional chest pain symptoms here recently.  Denies any suspicious food intake or sick contacts.  The history is provided by the patient.       Home Medications Prior to Admission medications   Medication Sig Start Date End Date Taking? Authorizing Provider  acetaminophen (TYLENOL) 500 MG tablet Take 500 mg by mouth every 6 (six) hours as needed. Patient not taking: Reported on 12/26/2021    [provider]  albuterol (PROVENTIL HFA;VENTOLIN HFA) 108 (90 Base) MCG/ACT inhaler Inhale 2 puffs into the lungs every 6 (six) hours as needed for wheezing or shortness of breath. 01/16/16   Jennye Moccasin, MD  aluminum-magnesium hydroxide-simethicone (MAALOX) 200-200-20 MG/5ML SUSP Take 30 mLs by mouth 4 (four) times daily -  before meals and at bedtime. Patient not taking: Reported on 08/03/2022 02/28/19   Sharman Cheek, MD  clonazePAM (KLONOPIN) 0.5 MG tablet Take 0.5 mg by mouth 2 (two) times daily as needed for anxiety.    [provider]  dexlansoprazole (DEXILANT) 60 MG capsule Take 1 capsule by mouth daily. Patient not taking: Reported on 12/26/2021    [provider]  dicyclomine (BENTYL) 20 MG tablet Take 1 tablet (20 mg total) by mouth 3 (three) times daily as needed for spasms. Patient not taking: Reported on 08/03/2022 03/04/19 12/26/21  Kem Boroughs B, FNP  DULoxetine 40 MG CPEP Take 40 mg by mouth daily. Patient not taking: Reported on 12/26/2021 07/27/16   Pucilowska, Braulio Conte B, MD  famotidine  (PEPCID) 20 MG tablet Take 1 tablet (20 mg total) by mouth 2 (two) times daily. Patient not taking: Reported on 08/03/2022 02/28/19   Sharman Cheek, MD  fexofenadine (ALLEGRA) 180 MG tablet Take 180 mg by mouth daily.    [provider]  FLUoxetine (PROZAC) 40 MG capsule Take 40 mg by mouth daily.    [provider]  fluticasone (FLONASE) 50 MCG/ACT nasal spray Place 2 sprays into both nostrils daily.     [provider]  hydrOXYzine (ATARAX/VISTARIL) 50 MG tablet Take 50 mg by mouth every 6 (six) hours as needed.     [provider]  lidocaine (XYLOCAINE) 2 % solution Use as directed 15 mLs in the mouth or throat as needed for mouth pain. Patient not taking: Reported on 08/03/2022 04/13/19   Chesley Noon, MD  MAGNESIUM PO Take 1 tablet by mouth daily.    [provider]  metoCLOPramide (REGLAN) 10 MG tablet Take 1 tablet (10 mg total) by mouth every 6 (six) hours as needed. Patient not taking: Reported on 12/26/2021 02/28/19   Sharman Cheek, MD  metoCLOPramide (REGLAN) 10 MG tablet Take 1 tablet (10 mg total) by mouth every 8 (eight) hours as needed for refractory nausea / vomiting. Patient not taking: Reported on 12/26/2021 04/18/19   Felicie Morn, NP  ondansetron [facility]) 4 MG tablet Take 1 tablet (4 mg total) by mouth every 8 (eight) hours as needed for nausea or vomiting. 10/13/22  Kelsea Mousel, DO  zolpidem (AMBIEN) 10 MG tablet Take 10 mg by mouth at bedtime as needed for sleep.    [provider]      Allergies    Haldol [haloperidol] and Morphine and codeine    Review of Systems   Review of Systems  Physical Exam Updated Vital Signs BP 136/76   Pulse 62   Temp 98.1 F (36.7 C) (Oral)   Resp 18   Wt 77.1 kg   SpO2 100%   BMI 27.44 kg/m  Physical Exam Vitals and nursing note reviewed.  Constitutional:      General: She is not in acute distress.    Appearance: She is well-developed. She is not ill-appearing.  HENT:      Head: Normocephalic and atraumatic.     Mouth/Throat:     Mouth: Mucous membranes are moist.  Eyes:     Extraocular Movements: Extraocular movements intact.     Conjunctiva/sclera: Conjunctivae normal.     Pupils: Pupils are equal, round, and reactive to light.  Cardiovascular:     Rate and Rhythm: Normal rate and regular rhythm.     Pulses: Normal pulses.     Heart sounds: Normal heart sounds. No murmur heard. Pulmonary:     Effort: Pulmonary effort is normal. No respiratory distress.     Breath sounds: Normal breath sounds.  Abdominal:     Palpations: Abdomen is soft.     Tenderness: There is no abdominal tenderness.  Musculoskeletal:     Cervical back: Normal range of motion and neck supple.  Skin:    General: Skin is warm and dry.     Capillary Refill: Capillary refill takes less than 2 seconds.  Neurological:     General: No focal deficit present.     Mental Status: She is alert.     ED Results / Procedures / Treatments   Labs (all labs ordered are listed, but only abnormal results are displayed) Labs Reviewed  CBC - Abnormal; Notable for the following components:      Result Value   WBC 12.1 (*)    All other components within normal limits  COMPREHENSIVE METABOLIC PANEL - Abnormal; Notable for the following components:   Potassium 3.2 (*)    Glucose, Bld 154 (*)    Calcium 8.4 (*)    Total Protein 6.0 (*)    All other components within normal limits  LIPASE, BLOOD  TROPONIN I (HIGH SENSITIVITY)  TROPONIN I (HIGH SENSITIVITY)    EKG EKG Interpretation  Date/Time:  Tuesday October 13 2022 15:23:04 EDT Ventricular Rate:  66 PR Interval:  138 QRS Duration: 82 QT Interval:  484 QTC Calculation: 507 R Axis:   65 Text Interpretation: Normal sinus rhythm with sinus arrhythmia Nonspecific T wave abnormality Abnormal ECG When compared with ECG of 02-Aug-2022 17:25, PREVIOUS ECG IS PRESENT Confirmed by Virgina Norfolk [phone]) on 10/13/2022 5:37:09 PM  Radiology DG  Chest 2 View  Result Date: 10/13/2022 CLINICAL DATA:  Chest pain. EXAM: CHEST - 2 VIEW COMPARISON:  August 03, 2022. FINDINGS: The heart size and mediastinal contours are within normal limits. Status post right shoulder arthroplasty. Right lung is clear. Minimal left basilar subsegmental atelectasis is noted with small left pleural effusion. IMPRESSION: Minimal left basilar subsegmental atelectasis with small left pleural effusion. Electronically Signed   By: Lupita Raider M.D.   On: 10/13/2022 16:21    Procedures Procedures    Medications Ordered in ED Medications  sodium chloride 0.9 %  bolus 500 mL (0 mLs Intravenous Stopped 10/13/22 2034)  fentaNYL (SUBLIMAZE) injection 50 mcg (50 mcg Intravenous Given 10/13/22 1837)  famotidine (PEPCID) IVPB 20 mg premix (0 mg Intravenous Stopped 10/13/22 2034)  ondansetron (ZOFRAN) injection 4 mg (4 mg Intravenous Given 10/13/22 1836)  fentaNYL (SUBLIMAZE) injection 50 mcg (50 mcg Intravenous Given 10/13/22 2036)  metoCLOPramide (REGLAN) injection 5 mg (5 mg Intravenous Given 10/13/22 2037)  diphenhydrAMINE (BENADRYL) injection 12.5 mg (12.5 mg Intravenous Given 10/13/22 2038)    ED Course/ Medical Decision Making/ A&P                             Medical Decision Making Risk Prescription drug management.   Joesph Fillers is here with chest pain and abdominal pain and nausea and vomiting.  Normal vitals.  No fever.  History of anxiety and asthma and kidney stones.  Differential diagnosis likely viral process/foodborne process/gastritis.  Will evaluate for ACS, pneumonia, pancreatitis, cholecystitis.  Is not particularly tender in abdomen.  Per my review interpretation labs no significant anemia, electrolyte abnormality kidney injury or leukocytosis.  Troponin normal.  EKG shows sinus rhythm.  No ischemic changes per my review and interpretation.  Checks x-ray reviewed interpreted by myself shows no evidence of pneumonia or pneumothorax.  Overall will give IV  fluids, IV Zofran, IV fentanyl, IV Pepcid and repeat troponin.  I suspect that this is likely GI bug or foodborne related process.  She has no symptoms to suggest obstructive bowel process.  Patient feeling better.  Troponin normal on repeat.  Overall suspect that this is GI related.  Discharged in good condition.  This chart was dictated using voice recognition software.  Despite best efforts to proofread,  errors can occur which can change the documentation meaning.         Final Clinical Impression(s) / ED Diagnoses Final diagnoses:  Nonspecific chest pain  Nausea and vomiting, unspecified vomiting type    Rx / DC Orders ED Discharge Orders          Ordered    ondansetron (ZOFRAN) 4 MG tablet  Every 8 hours PRN        10/13/22 2054              Virgina Norfolk, DO 10/13/22 2054

## 2022-10-13 NOTE — ED Provider Triage Note (Signed)
Emergency Medicine Provider Triage Evaluation Note  Shawna [name] , a 57 y.o. female  was evaluated in triage.  Pt complains of chest pain since yesterday. Constant and sharp. Non radiating. Reports pain improved with leaning forward. Denies any SOB. Reports some nausea and vomiting. Denies any cough of fever. Denies any recent illnesses.   Review of Systems  Positive:  Negative:   Physical Exam  BP 118/82 (BP Location: Left Arm)   Pulse 68   Temp 98.1 F (36.7 C) (Oral)   Resp 18   SpO2 96%  Gen:   Awake, no distress   Resp:  Normal effort  MSK:   Moves extremities without difficulty  Other:  Palpable radial pulses.   Medical Decision Making  Medically screening exam initiated at 3:26 PM.  Appropriate orders placed.  Shawna [name] was informed that the remainder of the evaluation will be completed by another provider, this initial triage assessment does not replace that evaluation, and the importance of remaining in the ED until their evaluation is complete.  Chest pain work up initiated. She reports that this pain feels similar to the episode she had a few days ago and was seen in the ER.     Achille Rich, PA-C 10/13/22 1529

## 2022-10-14 ENCOUNTER — Other Ambulatory Visit: Payer: Self-pay

## 2022-10-14 ENCOUNTER — Emergency Department
Admission: EM | Admit: 2022-10-14 | Discharge: 2022-10-14 | Disposition: A | Discharge status: Home / Self Care | Payer: 59 | Attending: Emergency Medicine | Admitting: Emergency Medicine

## 2022-10-14 DIAGNOSIS — F17210 Nicotine dependence, cigarettes, uncomplicated: Secondary | ICD-10-CM | POA: Diagnosis not present

## 2022-10-14 DIAGNOSIS — J449 Chronic obstructive pulmonary disease, unspecified: Secondary | ICD-10-CM | POA: Diagnosis not present

## 2022-10-14 DIAGNOSIS — F419 Anxiety disorder, unspecified: Secondary | ICD-10-CM | POA: Insufficient documentation

## 2022-10-14 DIAGNOSIS — F32A Depression, unspecified: Secondary | ICD-10-CM | POA: Diagnosis present

## 2022-10-14 DIAGNOSIS — F4323 Adjustment disorder with mixed anxiety and depressed mood: Secondary | ICD-10-CM | POA: Diagnosis not present

## 2022-10-14 HISTORY — DX: Chronic obstructive pulmonary disease, unspecified: J44.9

## 2022-10-14 LAB — URINE DRUG SCREEN, QUALITATIVE (ARMC ONLY)
Amphetamines, Ur Screen: NOT DETECTED
Barbiturates, Ur Screen: NOT DETECTED
Benzodiazepine, Ur Scrn: POSITIVE — AB
Cannabinoid 50 Ng, Ur ~~LOC~~: POSITIVE — AB
Cocaine Metabolite,Ur ~~LOC~~: NOT DETECTED
MDMA (Ecstasy)Ur Screen: NOT DETECTED
Methadone Scn, Ur: NOT DETECTED
Opiate, Ur Screen: NOT DETECTED
Phencyclidine (PCP) Ur S: NOT DETECTED
Tricyclic, Ur Screen: POSITIVE — AB

## 2022-10-14 LAB — CBC
HCT: 41 % (ref 36.0–46.0)
Hemoglobin: 13.4 g/dL (ref 12.0–15.0)
MCH: 28.6 pg (ref 26.0–34.0)
MCHC: 32.7 g/dL (ref 30.0–36.0)
MCV: 87.6 fL (ref 80.0–100.0)
Platelets: 331 10*3/uL (ref 150–400)
RBC: 4.68 MIL/uL (ref 3.87–5.11)
RDW: 14 % (ref 11.5–15.5)
WBC: 10.3 10*3/uL (ref 4.0–10.5)
nRBC: 0 % (ref 0.0–0.2)

## 2022-10-14 LAB — COMPREHENSIVE METABOLIC PANEL
ALT: 17 U/L (ref 0–44)
AST: 26 U/L (ref 15–41)
Albumin: 4 g/dL (ref 3.5–5.0)
Alkaline Phosphatase: 89 U/L (ref 38–126)
Anion gap: 14 (ref 5–15)
BUN: 8 mg/dL (ref 6–20)
CO2: 19 mmol/L — ABNORMAL LOW (ref 22–32)
Calcium: 8.6 mg/dL — ABNORMAL LOW (ref 8.9–10.3)
Chloride: 108 mmol/L (ref 98–111)
Creatinine, Ser: 0.8 mg/dL (ref 0.44–1.00)
GFR, Estimated: >60 mL/min (ref >60)
Glucose, Bld: 181 mg/dL — ABNORMAL HIGH (ref 70–99)
Potassium: 3.4 mmol/L — ABNORMAL LOW (ref 3.5–5.1)
Sodium: 141 mmol/L (ref 135–145)
Total Bilirubin: 1 mg/dL (ref 0.3–1.2)
Total Protein: 6.9 g/dL (ref 6.5–8.1)

## 2022-10-14 LAB — ETHANOL: Alcohol, Ethyl (B): <10 mg/dL (ref <10)

## 2022-10-14 LAB — ACETAMINOPHEN LEVEL: Acetaminophen (Tylenol), Serum: <10 ug/mL — ABNORMAL LOW (ref 10–30)

## 2022-10-14 LAB — SALICYLATE LEVEL: Salicylate Lvl: <7 mg/dL — ABNORMAL LOW (ref 7.0–30.0)

## 2022-10-14 MED ORDER — ONDANSETRON 4 MG PO TBDP
4.0000 mg | ORAL_TABLET | Freq: Once | ORAL | Status: AC
  Administered 2022-10-14: 4 mg via ORAL
  Filled 2022-10-14: qty 1

## 2022-10-14 NOTE — ED Notes (Signed)
Pt given dinner tray and drink at this time.

## 2022-10-14 NOTE — ED Notes (Addendum)
E-signature not working at this time. Pt verbalized understanding of D/C instructions, prescriptions and follow up care with no further questions at this time. Pt in NAD and ambulatory at time of D/C. Belongings bag 1/1 sent with patient at time of dispo

## 2022-10-14 NOTE — ED Notes (Signed)
VOL/  PENDING  CONSULT 

## 2022-10-14 NOTE — Consult Note (Incomplete)
[facility] Face-to-Face Psychiatry Consult   Reason for Consult:  Medication management  Referring Physician:  Merwyn Katos, MD Patient Identification: Shawna [name] MRN:  [id number] Principal Diagnosis: Adjustment disorder with mixed anxiety and depressed mood Diagnosis:  Principal Problem:   Adjustment disorder with mixed anxiety and depressed mood Active Problems:   Anxiety disorder, unspecified   Total Time spent with patient: 45 minutes  Subjective:   Shawna [name] is a 57 y.o. female patient admitted with "depression and just knowing that I need to be here".  HPI:  Patient seen and chart reviewed. Shawna [name], 57 year-old Caucasian female with past psychiatric history anxiety, depression, insomnia presented to the emergency department with complaints of depressive symptoms. Patient seen in the emergency department on 06/09 with concerns for nausea, vomiting, abdominal pain and last night for chest pain.    On assessment, patient reports depressive symptoms and identifies her recent stressors as being displaced from her apartment.  She states she is currently staying with her daughter's boyfriend.  She denies active suicidal or homicidal ideation.  Similarly she denies auditory or visual hallucinations, paranoia or delusional thought content.  She reports occasional sleep disturbances but denies bad dreams and nightmares.  Appetite is reported as adequate, denies significant weight loss.  Patient reports a history of overdose on prescription medication (Prozac) April of this year that resulted in a psychiatric inpatient admission at [facility].   Patient denies alcohol use but admits to occasional marijuana use, with last use being yesterday.  She also smokes cigarettes.  She is not currently on any depression medication, as she reports that it makes her have suicidal thoughts.  She says she has taken Prozac in the past but is not interested in taking or being prescribed any antidepressant medication  at this time. Patient reports she receives outpatient psychiatric services at [facility] in [place] where she sees Dr. Stevphen Rochester Su, with her last visit being 1-1/2 months ago.  Patient reports she takes clonazepam, last taken prior to ED arrival.  Patient states she is not currently being followed by a therapist.  She states she is disabled and receives SSI benefits. She denies having access to firearms.  During the evaluation, patient observed lying in bed, alert and oriented x 4. She appeared anxious but cooperative. Speech clear and coherent, answers questions appropriately. Mood congruent with affect. She does not appear to be responding to internal or external stimuli, nor is there evidence of paranoia or delusional though content. UDS+ Tricyclics, TSH, Benzodiazepines. PDMP Review revealed prescription for Clonazepam 1 Mg tablet (130) written on 08/19/22 and filled on 09/11/22.  Multiple attempts to reach patient's daughter, Julious Oka [phone]) unsuccessful.   1730: Addendum -- This author received a secure message from patient's assigned nurse, stating that the patient expressed discomfort about leaving and indicated a preference not to leave today.  Upon a second conversation with the patient, she reiterated feeling uncomfortable about going home, though she stated she feels safe at home and can keep herself safe.  She mentioned waking up feeling uncomfortable this morning but could not further explain her feelings.  She expressed a desire to go to inpatient for therapy.  This author reviewed the plan with the patient again, emphasizing follow-up with her outpatient provider to discuss therapy and alternative treatment options for depression.  Past Psychiatric History: See previous   Risk to Self:  No  Risk to Others:  No  Prior Inpatient Therapy:  [facility] (April 2024) Prior  Outpatient Therapy:  [facility] in [place]  Past Medical History:  Past Medical History:  Diagnosis Date    Anxiety    Asthma    Bulging lumbar disc    Bulging lumbar disc    COPD (chronic obstructive pulmonary disease) (HCC)    GERD (gastroesophageal reflux disease)    Insomnia    Kidney stone    Pneumonia 03/2015   Sciatic leg pain    Right side   Sciatica of right side    Weakness    Bil knees, and ankles    Past Surgical History:  Procedure Laterality Date   ABDOMINAL HYSTERECTOMY     Partial   ESOPHAGOGASTRODUODENOSCOPY (EGD) WITH PROPOFOL N/A 11/27/2015   Procedure: ESOPHAGOGASTRODUODENOSCOPY (EGD) WITH PROPOFOL;  Surgeon: Christena Deem, MD;  Location: [facility] ENDOSCOPY;  Service: Endoscopy;  Laterality: N/A;   SHOULDER SURGERY Right    TONSILLECTOMY     TUBAL LIGATION     Family History:  Family History  Problem Relation Age of Onset   Fibromyalgia Mother    Family Psychiatric  History:  Patient reports family history of depression in her mother and a maternal female cousin who completed suicide by a firearm a few years ago.  Social History:  Social History   Substance and Sexual Activity  Alcohol Use Not Currently   Comment: occ     Social History   Substance and Sexual Activity  Drug Use No    Social History   Socioeconomic History   Marital status: Single    Spouse name: Not on file   Number of children: Not on file   Years of education: Not on file   Highest education level: Not on file  Occupational History   Not on file  Tobacco Use   Smoking status: Every Day    Packs/day: 1.00    Years: 40.00    Additional pack years: 0.00    Total pack years: 40.00    Types: Cigarettes   Smokeless tobacco: Current  Vaping Use   Vaping Use: Never used  Substance and Sexual Activity   Alcohol use: Not Currently    Comment: occ   Drug use: No   Sexual activity: Never  Other Topics Concern   Not on file  Social History Narrative   Not on file   Social Determinants of Health   Financial Resource Strain: Not on file  Food Insecurity: Not on file   Transportation Needs: Not on file  Physical Activity: Not on file  Stress: Not on file  Social Connections: Not on file   Additional Social History:    Allergies:   Allergies  Allergen Reactions   Haldol [Haloperidol]     Made pt "feel really weird".   Morphine And Codeine Nausea And Vomiting    Patient states this medication makes her have sever flu symptoms.    Labs:  Results for orders placed or performed during the hospital encounter of 10/14/22 (from the past 48 hour(s))  Comprehensive metabolic panel     Status: Abnormal   Collection Time: 10/14/22  2:34 PM  Result Value Ref Range   Sodium 141 135 - 145 mmol/L   Potassium 3.4 (L) 3.5 - 5.1 mmol/L   Chloride 108 98 - 111 mmol/L   CO2 19 (L) 22 - 32 mmol/L   Glucose, Bld 181 (H) 70 - 99 mg/dL    Comment: Glucose reference range applies only to samples taken after fasting for at least 8 hours.  BUN 8 6 - 20 mg/dL   Creatinine, Ser 1.61 0.44 - 1.00 mg/dL   Calcium 8.6 (L) 8.9 - 10.3 mg/dL   Total Protein 6.9 6.5 - 8.1 g/dL   Albumin 4.0 3.5 - 5.0 g/dL   AST 26 15 - 41 U/L   ALT 17 0 - 44 U/L   Alkaline Phosphatase 89 38 - 126 U/L   Total Bilirubin 1.0 0.3 - 1.2 mg/dL   GFR, Estimated >09 >60 mL/min    Comment: (NOTE) Calculated using the CKD-EPI Creatinine Equation (2021)    Anion gap 14 5 - 15    Comment: Performed at [facility], [address], [place], [place] [address]  Salicylate level     Status: Abnormal   Collection Time: 10/14/22  2:34 PM  Result Value Ref Range   Salicylate Lvl <7.0 (L) 7.0 - 30.0 mg/dL    Comment: Performed at [facility], [address], [place], [place] [address]  Acetaminophen level     Status: Abnormal   Collection Time: 10/14/22  2:34 PM  Result Value Ref Range   Acetaminophen (Tylenol), Serum <10 (L) 10 - 30 ug/mL    Comment: (NOTE) Therapeutic concentrations vary significantly. A range of 10-30 ug/mL  may be an effective concentration for many  patients. However, some  are best treated at concentrations outside of this range. Acetaminophen concentrations >150 ug/mL at 4 hours after ingestion  and >50 ug/mL at 12 hours after ingestion are often associated with  toxic reactions.  Performed at [facility], [address] [place], [place] [address]   cbc     Status: None   Collection Time: 10/14/22  2:34 PM  Result Value Ref Range   WBC 10.3 4.0 - 10.5 K/uL   RBC 4.68 3.87 - 5.11 MIL/uL   Hemoglobin 13.4 12.0 - 15.0 g/dL   HCT 56.2 13.0 - 86.5 %   MCV 87.6 80.0 - 100.0 fL   MCH 28.6 26.0 - 34.0 pg   MCHC 32.7 30.0 - 36.0 g/dL   RDW 78.4 69.6 - 29.5 %   Platelets 331 150 - 400 K/uL   nRBC 0.0 0.0 - 0.2 %    Comment: Performed at [facility], [address] [place], [place] [address]  Urine Drug Screen, Qualitative     Status: Abnormal   Collection Time: 10/14/22  2:34 PM  Result Value Ref Range   Tricyclic, Ur Screen POSITIVE (A) NONE DETECTED   Amphetamines, Ur Screen NONE DETECTED NONE DETECTED   MDMA (Ecstasy)Ur Screen NONE DETECTED NONE DETECTED   Cocaine Metabolite,Ur [place] NONE DETECTED NONE DETECTED   Opiate, Ur Screen NONE DETECTED NONE DETECTED   Phencyclidine (PCP) Ur S NONE DETECTED NONE DETECTED   Cannabinoid 50 Ng, Ur [place] POSITIVE (A) NONE DETECTED   Barbiturates, Ur Screen NONE DETECTED NONE DETECTED   Benzodiazepine, Ur Scrn POSITIVE (A) NONE DETECTED   Methadone Scn, Ur NONE DETECTED NONE DETECTED    Comment: (NOTE) Tricyclics + metabolites, urine    Cutoff 1000 ng/mL Amphetamines + metabolites, urine  Cutoff 1000 ng/mL MDMA (Ecstasy), urine              Cutoff 500 ng/mL Cocaine Metabolite, urine          Cutoff 300 ng/mL Opiate + metabolites, urine        Cutoff 300 ng/mL Phencyclidine (PCP), urine         Cutoff 25 ng/mL Cannabinoid, urine  Cutoff 50 ng/mL Barbiturates + metabolites, urine  Cutoff 200 ng/mL Benzodiazepine, urine              Cutoff 200  ng/mL Methadone, urine                   Cutoff 300 ng/mL  The urine drug screen provides only a preliminary, unconfirmed analytical test result and should not be used for non-medical purposes. Clinical consideration and professional judgment should be applied to any positive drug screen result due to possible interfering substances. A more specific alternate chemical method must be used in order to obtain a confirmed analytical result. Gas chromatography / mass spectrometry (GC/MS) is the preferred confirm atory method. Performed at [facility], [address] [place], [place] [address]   Ethanol     Status: None   Collection Time: 10/14/22  2:34 PM  Result Value Ref Range   Alcohol, Ethyl (B) <10 <10 mg/dL    Comment: (NOTE) Lowest detectable limit for serum alcohol is 10 mg/dL.  For medical purposes only. Performed at [facility], [address] [place], [place] [address]     No current facility-administered medications for this encounter.   Current Outpatient Medications  Medication Sig Dispense Refill   acetaminophen (TYLENOL) 500 MG tablet Take 500 mg by mouth every 6 (six) hours as needed. (Patient not taking: Reported on 12/26/2021)     albuterol (PROVENTIL HFA;VENTOLIN HFA) 108 (90 Base) MCG/ACT inhaler Inhale 2 puffs into the lungs every 6 (six) hours as needed for wheezing or shortness of breath. 1 Inhaler 2   aluminum-magnesium hydroxide-simethicone (MAALOX) 200-200-20 MG/5ML SUSP Take 30 mLs by mouth 4 (four) times daily -  before meals and at bedtime. (Patient not taking: Reported on 08/03/2022) 355 mL 0   clonazePAM (KLONOPIN) 0.5 MG tablet Take 0.5 mg by mouth 2 (two) times daily as needed for anxiety.     dexlansoprazole (DEXILANT) 60 MG capsule Take 1 capsule by mouth daily. (Patient not taking: Reported on 12/26/2021)     dicyclomine (BENTYL) 20 MG tablet Take 1 tablet (20 mg total) by mouth 3 (three) times daily as needed for spasms. (Patient  not taking: Reported on 08/03/2022) 30 tablet 0   DULoxetine 40 MG CPEP Take 40 mg by mouth daily. (Patient not taking: Reported on 12/26/2021) 30 capsule 1   famotidine (PEPCID) 20 MG tablet Take 1 tablet (20 mg total) by mouth 2 (two) times daily. (Patient not taking: Reported on 08/03/2022) 60 tablet 0   fexofenadine (ALLEGRA) 180 MG tablet Take 180 mg by mouth daily.     FLUoxetine (PROZAC) 40 MG capsule Take 40 mg by mouth daily.     fluticasone (FLONASE) 50 MCG/ACT nasal spray Place 2 sprays into both nostrils daily.      hydrOXYzine (ATARAX/VISTARIL) 50 MG tablet Take 50 mg by mouth every 6 (six) hours as needed.      lidocaine (XYLOCAINE) 2 % solution Use as directed 15 mLs in the mouth or throat as needed for mouth pain. (Patient not taking: Reported on 08/03/2022) 100 mL 0   MAGNESIUM PO Take 1 tablet by mouth daily.     metoCLOPramide (REGLAN) 10 MG tablet Take 1 tablet (10 mg total) by mouth every 6 (six) hours as needed. (Patient not taking: Reported on 12/26/2021) 30 tablet 0   metoCLOPramide (REGLAN) 10 MG tablet Take 1 tablet (10 mg total) by mouth every 8 (eight) hours as needed for refractory nausea / vomiting. (  Patient not taking: Reported on 12/26/2021) 20 tablet 0   ondansetron (ZOFRAN) 4 MG tablet Take 1 tablet (4 mg total) by mouth every 8 (eight) hours as needed for nausea or vomiting. 12 tablet 0   zolpidem (AMBIEN) 10 MG tablet Take 10 mg by mouth at bedtime as needed for sleep.      Musculoskeletal: Strength & Muscle Tone: within normal limits Gait & Station: normal Patient leans: N/A            Psychiatric Specialty Exam:  Presentation  General Appearance:  Casual  Eye Contact: Good  Speech: Clear and Coherent  Speech Volume: Normal  Handedness: Right   Mood and Affect  Mood: Euthymic  Affect: Congruent   Thought Process  Thought Processes: Coherent  Descriptions of Associations:Intact  Orientation:Full (Time, Place and Person)  Thought  Content:Logical  History of Schizophrenia/Schizoaffective disorder:No  Duration of Psychotic Symptoms:No data recorded Hallucinations:Hallucinations: None  Ideas of Reference:None  Suicidal Thoughts:Suicidal Thoughts: No  Homicidal Thoughts:Homicidal Thoughts: No   Sensorium  Memory: Immediate Good; Recent Good; Remote Good  Judgment: Good  Insight: Good   Executive Functions  Concentration: Good  Attention Span: Good  Recall: Good  Fund of Knowledge: Good  Language: Good   Psychomotor Activity  Psychomotor Activity:Psychomotor Activity: Normal   Assets  Assets: Communication Skills; Desire for Improvement; Physical Health; Resilience   Sleep  Sleep:Sleep: Fair   Physical Exam: Physical Exam Vitals and nursing note reviewed.  Constitutional:      Appearance: Normal appearance.  HENT:     Head: Normocephalic.     Nose: Nose normal.  Cardiovascular:     Pulses: Normal pulses.  Pulmonary:     Effort: Pulmonary effort is normal.  Musculoskeletal:        General: Normal range of motion.     Cervical back: Normal range of motion.  Skin:    General: Skin is dry.  Neurological:     General: No focal deficit present.     Mental Status: She is alert and oriented to person, place, and time.  Psychiatric:        Attention and Perception: Attention and perception normal. She does not perceive auditory or visual hallucinations.        Mood and Affect: Affect normal.        Speech: Speech normal.        Behavior: Behavior normal. Behavior is cooperative.        Thought Content: Thought content is not paranoid or delusional. Thought content does not include homicidal or suicidal ideation.        Cognition and Memory: Cognition and memory normal.        Judgment: Judgment normal.    Review of Systems  Psychiatric/Behavioral: Negative.     Blood pressure (!) 143/95, pulse 98, temperature 98 F (36.7 C), resp. rate 18, height 5' 6.5" (1.689 m),  weight 79.4 kg, SpO2 97 %. Body mass index is 27.82 kg/m.  Treatment Plan Summary: Plan : 57 y.o. female presented to the emergency department with complaints of depressive symptoms.  She identifies primary stressor as being displaced from her apartment.  She is currently not want any antidepressant medications and is not interested in taking them.  She is currently not experiencing any psychiatric impairment and denies active suicidal or homicidal thoughts.  Patient is not an imminent risk to herself or others at this time.  Patient is advised to contact her outpatient provider to schedule a follow-up appointment to  discuss alternative treatment options for depression and therapy for additional support and coping strategies.  Plan reviewed with ED physician, Dr. Dellia Cloud.  Disposition:  No evidence of imminent risk to self or others at present.   Patient does not meet criteria for psychiatric inpatient admission. Supportive therapy provided about ongoing stressors. Discussed crisis plan, support from social network, calling 911, coming to the Emergency Department, and calling Suicide Hotline.  Norma Fredrickson, NP 10/14/2022 4:37 PM

## 2022-10-14 NOTE — BH Assessment (Signed)
Comprehensive Clinical Assessment (CCA) Screening, Triage and Referral Note  10/14/2022 TRAMANH STJAMES [id number]  Shawna [name], 57 year old female who presents to [facility] ED involuntarily for treatment. Per triage note, Pt to ED for depression. Denies SI/HI.   During TTS assessment pt presents alert and oriented x 4, restless but cooperative, and mood-congruent with affect. The pt does not appear to be responding to internal or external stimuli. Neither is the pt presenting with any delusional thinking. Pt verified the information provided to triage RN.   Pt identifies her main complaint to be that she is stressed out from being "displaced". Patient reports she had to leave her apartment and worries about finding somewhere new to live. Patient states she is currently staying with her daughter's boyfriend. Patient reports she is disabled, receiving SSI benefits yet feels she needs to be at the hospital. Patient denies feeling unsafe at her home. Patient denies current SI/HI/AH/VH. Patient says she sleeps "off and on" with some difficulty falling and staying asleep. Her eating habits are okay with no weight loss. Patient admits to daily marijuana use and denies all other illicit substances and alcohol. Pt reports she is being followed by Dr. Janeece Riggers at [facility] but has not been seen in about a month. "I took a nerve pill before coming to the ED." Pt reports INPT hx at [facility] in 2023. Pt reports family hx of MH with her mom taking medication for depression. Patient declined any further psych medications due to increasing suicidal thoughts. Patient says she came to the ED to speak with a counselor. Psych Team advised patient to schedule outpatient treatment with Dr. Janeece Riggers. Patient has insurance (Medicaid) and declined tx from [employer]. Patient verbalized understanding.    Per Christal, NP, pt does not meet criteria for inpatient psychiatric admission.    Chief Complaint: No chief complaint on file.  Visit  Diagnosis: Depression  Patient Reported Information How did you hear about [place]? Self  What Is the Reason for Your Visit/Call Today? Patient reports she is depressed due to being "displaced".  How Long Has This Been Causing You Problems? 1-6 months  What Do You Feel Would Help You the Most Today? Treatment for Depression or other mood problem; Stress Management   Have You Recently Had Any Thoughts About Hurting Yourself? No  Are You Planning to Commit Suicide/Harm Yourself At This time? No   Have you Recently Had Thoughts About Hurting Someone [name]? No  Are You Planning to Harm Someone at This Time? No  Explanation: Pt denies HI.   Have You Used Any Alcohol or Drugs in the Past 24 Hours? Yes  How Long Ago Did You Use Drugs or Alcohol? No data recorded What Did You Use and How Much? Marijuana- unknown   Do You Currently Have a Therapist/Psychiatrist? Yes  Name of Therapist/Psychiatrist: Dr. Janeece Riggers- [facility]   Have You Been Recently Discharged From Any Office Practice or Programs? No  Explanation of Discharge From Practice/Program: n/a    CCA Screening Triage Referral Assessment Type of Contact: Face-to-Face  Telemedicine Service Delivery:   Is this Initial or Reassessment?   Date Telepsych consult ordered in [facility]:    Time Telepsych consult ordered in [facility]:    Location of Assessment: [facility] ED  Provider Location: [facility] ED    Collateral Involvement: Pt's Daughter : Tammi Klippel [phone])   Does Patient Have a Court Appointed Legal Guardian? No data recorded Name and Contact of Legal Guardian: No data recorded  If Minor and Not Living with Parent(s), Who has Custody? n/a  Is CPS involved or ever been involved? Never  Is APS involved or ever been involved? Never   Patient Determined To Be At Risk for Harm To Self or Others Based on Review of Patient Reported Information or Presenting Complaint? No  Method: No Plan  Availability of Means: No  access or NA  Intent: Vague intent or NA  Notification Required: No need or identified person  Additional Information for Danger to Others Potential: Previous attempts  Additional Comments for Danger to Others Potential: Pt reports hx of SI attempts  Are There Guns or Other Weapons in Your Home? No  Types of Guns/Weapons: Pt denies access to guns/ weapons.  Are These Weapons Safely Secured?                            Yes (Pt denies access to guns/weapons)  Who Could Verify You Are Able To Have These Secured: n/a  Do You Have any Outstanding Charges, Pending Court Dates, Parole/Probation? Pt denies any legal issues.  Contacted To Inform of Risk of Harm To Self or Others: Other: Comment (n/a)   Does Patient Present under Involuntary Commitment? No    [place] of Residence: [place]   Patient Currently Receiving the Following Services: Medication Management   Determination of Need: Emergent (2 hours)   Options For Referral: ED Visit; Outpatient Therapy   Discharge Disposition:     Clerance Lav, Counselor, LCAS-A

## 2022-10-14 NOTE — ED Notes (Addendum)
Pt dressed out with this RN and Caitlyn NT. Belongings include: Black shoes Blue print top [employer] pants Yellow necklace 2 Yellow rings Purple bag/purse Cell phone Blue tank top Black socks   Pt wearing depends

## 2022-10-14 NOTE — ED Notes (Signed)
Pt reports she "is not comfortable going home". Pt reports she is interested in inpatient treatment. This RN messaged NP- NP currently assessing options.

## 2022-10-14 NOTE — ED Provider Notes (Signed)
[facility] Provider Note   Event Date/Time   First MD Initiated Contact with Patient 10/14/22 1502     (approximate) History  No chief complaint on file.  HPI Shawna [name] is a 57 y.o. female with complaints of depressive symptoms.  Patient denies any active suicidal or homicidal ideation.  Patient denies any auditory/visual hallucinations.  Patient denies wanting any medications for her depression.  Patient states that she is just looking for some help in the form of therapy.  Patient requesting to talk to psychiatric staff for further options. ROS: Patient currently denies any vision changes, tinnitus, difficulty speaking, facial droop, sore throat, chest pain, shortness of breath, abdominal pain, nausea/vomiting/diarrhea, dysuria, or weakness/numbness/paresthesias in any extremity   Physical Exam  Triage Vital Signs: ED Triage Vitals [10/14/22 1432]  Enc Vitals Group     BP (!) 143/95     Pulse Rate 98     Resp 18     Temp 98 F (36.7 C)     Temp src      SpO2 97 %     Weight 175 lb (79.4 kg)     Height 5' 6.5" (1.689 m)     Head Circumference      Peak Flow      Pain Score 0     Pain Loc      Pain Edu?      Excl. in GC?    Most recent vital signs: Vitals:   10/14/22 1432  BP: (!) 143/95  Pulse: 98  Resp: 18  Temp: 98 F (36.7 C)  SpO2: 97%   General: Awake, oriented x4. CV:  Good peripheral perfusion.  Resp:  Normal effort.  Abd:  No distention.  Other:  Middle-aged well-developed, well-nourished Caucasian female laying in bed in no acute distress ED Results / Procedures / Treatments  Labs (all labs ordered are listed, but only abnormal results are displayed) Labs Reviewed  COMPREHENSIVE METABOLIC PANEL - Abnormal; Notable for the following components:      Result Value   Potassium 3.4 (*)    CO2 19 (*)    Glucose, Bld 181 (*)    Calcium 8.6 (*)    All other components within normal limits  SALICYLATE LEVEL - Abnormal; Notable  for the following components:   Salicylate Lvl <7.0 (*)    All other components within normal limits  ACETAMINOPHEN LEVEL - Abnormal; Notable for the following components:   Acetaminophen (Tylenol), Serum <10 (*)    All other components within normal limits  URINE DRUG SCREEN, QUALITATIVE ([facility] ONLY) - Abnormal; Notable for the following components:   Tricyclic, Ur Screen POSITIVE (*)    Cannabinoid 50 Ng, Ur [place] POSITIVE (*)    Benzodiazepine, Ur Scrn POSITIVE (*)    All other components within normal limits  CBC  ETHANOL  PROCEDURES: Critical Care performed: No Procedures MEDICATIONS ORDERED IN ED: Medications  ondansetron (ZOFRAN-ODT) disintegrating tablet 4 mg (4 mg Oral Given 10/14/22 1502)   IMPRESSION / MDM / ASSESSMENT AND PLAN / ED COURSE  I reviewed the triage vital signs and the nursing notes.                             Patient's presentation is most consistent with acute presentation with potential threat to life or bodily function. Thoughts are linear and organized, and patient has no SI, AH, VH, or HI. Prior suicide attempt: Denies  Prior Psychiatric Hospitalizations: None  Clinically patient displays no overt toxidrome; they are well appearing, with low suspicion for toxic ingestion given history and exam. Thoughts unlikely 2/2 anemia, hypothyroidism, infection, or ICH.  Consult: Psychiatry to evaluate patient for potential hold for danger to self. Disposition  Discharge. Patient evaluated by both emergency and psychiatric professionals and deemed safe at this time for discharge. They have a plan for psychiatric follow up and a safe place to stay with access to physical and emotional support.    FINAL CLINICAL IMPRESSION(S) / ED DIAGNOSES   Final diagnoses:  Depression, unspecified depression type   Rx / DC Orders   ED Discharge Orders     None      Note:  This document was prepared using Dragon voice recognition software and may include unintentional  dictation errors.   Merwyn Katos, MD 10/14/22 [phone]

## 2022-10-14 NOTE — ED Triage Notes (Signed)
Pt to ED for depression. Denies SI/HI.

## 2022-11-04 ENCOUNTER — Encounter: Payer: Self-pay | Admitting: Emergency Medicine

## 2022-11-04 ENCOUNTER — Telehealth: Payer: Self-pay

## 2022-11-04 ENCOUNTER — Other Ambulatory Visit: Payer: Self-pay

## 2022-11-04 ENCOUNTER — Ambulatory Visit
Admission: EM | Admit: 2022-11-04 | Discharge: 2022-11-04 | Disposition: A | Discharge status: Home / Self Care | Payer: 59 | Attending: Family Medicine | Admitting: Family Medicine

## 2022-11-04 DIAGNOSIS — M545 Low back pain, unspecified: Secondary | ICD-10-CM | POA: Diagnosis not present

## 2022-11-04 MED ORDER — DEXAMETHASONE SODIUM PHOSPHATE 10 MG/ML IJ SOLN
10.0000 mg | Freq: Once | INTRAMUSCULAR | Status: AC
  Administered 2022-11-04: 10 mg via INTRAMUSCULAR

## 2022-11-04 MED ORDER — METHOCARBAMOL 500 MG PO TABS: 500.0000 mg | ORAL_TABLET | Freq: Three times a day (TID) | ORAL | 0 refills | Status: DC | PRN

## 2022-11-04 MED ORDER — PREDNISONE 20 MG PO TABS: 40.0000 mg | ORAL_TABLET | Freq: Every day | ORAL | 0 refills | Status: DC

## 2022-11-04 MED ORDER — ONDANSETRON 4 MG PO TBDP
4.0000 mg | ORAL_TABLET | Freq: Once | ORAL | Status: AC
  Administered 2022-11-04: 4 mg via ORAL

## 2022-11-04 MED ORDER — KETOROLAC TROMETHAMINE 30 MG/ML IJ SOLN
30.0000 mg | Freq: Once | INTRAMUSCULAR | Status: AC
  Administered 2022-11-04: 30 mg via INTRAMUSCULAR

## 2022-11-04 NOTE — ED Triage Notes (Addendum)
Patient complains of back pain that started 2 weeks ago.  Pain is across lower back, but does not radiate into either leg.  No known injury  Patient has taken ibuprofen, last dosing was this morning around 7:00 am

## 2022-11-04 NOTE — Discharge Instructions (Signed)
Take medication as prescribed.  Recommend applying a heating pad to your lower back and avoid prolonged sitting as this can cause your back to stiffen. If at any point your symptoms worsen or do not improve with treatment return for evaluation.  If your symptoms become severe or if you develop any weakness of your lower extremities go to the nearest emergency department.

## 2022-11-04 NOTE — ED Provider Notes (Signed)
[facility]    CSN: [id number] Arrival date & time: 11/04/22  1344      History   Chief Complaint Chief Complaint  Patient presents with   Back Pain    HPI Shawna [name] is a 57 y.o. female.   HPI Patient with a history tree of degenerative disc disease, sciatica, COPD and GERD presents today with bilateral lower lumbar pain.  She reports pain has been present for 2 weeks.  She has not had any known injury.  She reports the pain has been persistent and she took ibuprofen without improvement of low back pain.  Denies any lower extremity weakness or paresthesias associated with back pain. She is not having any pain radiating into her lower extremities.  Past Medical History:  Diagnosis Date   Anxiety    Asthma    Bulging lumbar disc    Bulging lumbar disc    COPD (chronic obstructive pulmonary disease) (HCC)    GERD (gastroesophageal reflux disease)    Insomnia    Kidney stone    Pneumonia 03/2015   Sciatic leg pain    Right side   Sciatica of right side    Weakness    Bil knees, and ankles    Patient Active Problem List   Diagnosis Date Noted   Adjustment disorder with mixed anxiety and depressed mood 10/14/2022   Suicide attempt (HCC) 08/03/2022   Visual hallucinations 07/30/2017   Colitis 01/22/2017   Dyslipidemia 07/26/2016   Low vitamin B12 level 07/26/2016   Major depressive disorder, 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   Overdose of sedative or hypnotic 07/23/2016   COPD exacerbation (HCC) 07/23/2016   GERD (gastroesophageal reflux disease) 07/23/2016   Cannabis use disorder, moderate, 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   Tobacco use disorder 07/23/2016   Sedative, hypnotic or anxiolytic use disorder, severe, 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   Bacterial pneumonia 01/23/2015   Leukocytosis 01/23/2015   Sepsis (HCC) 01/23/2015   Hypoxemia 01/23/2015   Opioid use disorder, moderate, dependence (HCC) 12/31/2014   Anxiety disorder,  unspecified 12/31/2014    Past Surgical History:  Procedure Laterality Date   ABDOMINAL HYSTERECTOMY     Partial   ESOPHAGOGASTRODUODENOSCOPY (EGD) WITH PROPOFOL N/A 11/27/2015   Procedure: ESOPHAGOGASTRODUODENOSCOPY (EGD) WITH PROPOFOL;  Surgeon: Christena Deem, MD;  Location: [facility] ENDOSCOPY;  Service: Endoscopy;  Laterality: N/A;   SHOULDER SURGERY Right    TONSILLECTOMY     TUBAL LIGATION      OB History   No obstetric history on file.      Home Medications    Prior to Admission medications   Medication Sig Start Date End Date Taking? Authorizing Provider  methocarbamol (ROBAXIN) 500 MG tablet Take 1 tablet (500 mg total) by mouth every 8 (eight) hours as needed for muscle spasms (Back Pain). 11/04/22  Yes Bing Neighbors, NP  predniSONE (DELTASONE) 20 MG tablet Take 2 tablets (40 mg total) by mouth daily with breakfast. 11/04/22  Yes Bing Neighbors, NP  acetaminophen (TYLENOL) 500 MG tablet Take 500 mg by mouth every 6 (six) hours as needed. Patient not taking: Reported on 12/26/2021    [provider]  albuterol (PROVENTIL HFA;VENTOLIN HFA) 108 (90 Base) MCG/ACT inhaler Inhale 2 puffs into the lungs every 6 (six) hours as needed for wheezing or shortness of breath. 01/16/16   Jennye Moccasin, MD  aluminum-magnesium hydroxide-simethicone (MAALOX) 200-200-20 MG/5ML SUSP Take 30 mLs by mouth 4 (four) times daily -  before meals and  at bedtime. Patient not taking: Reported on 08/03/2022 02/28/19   Sharman Cheek, MD  clonazePAM (KLONOPIN) 0.5 MG tablet Take 0.5 mg by mouth 2 (two) times daily as needed for anxiety.    [provider]  dexlansoprazole (DEXILANT) 60 MG capsule Take 1 capsule by mouth daily. Patient not taking: Reported on 12/26/2021    [provider]  dicyclomine (BENTYL) 20 MG tablet Take 1 tablet (20 mg total) by mouth 3 (three) times daily as needed for spasms. Patient not taking: Reported on 08/03/2022 03/04/19 12/26/21  Kem Boroughs  B, FNP  DULoxetine 40 MG CPEP Take 40 mg by mouth daily. Patient not taking: Reported on 12/26/2021 07/27/16   Pucilowska, Braulio Conte B, MD  famotidine (PEPCID) 20 MG tablet Take 1 tablet (20 mg total) by mouth 2 (two) times daily. Patient not taking: Reported on 08/03/2022 02/28/19   Sharman Cheek, MD  fexofenadine (ALLEGRA) 180 MG tablet Take 180 mg by mouth daily.    [provider]  FLUoxetine (PROZAC) 40 MG capsule Take 40 mg by mouth daily. Patient not taking: Reported on 11/04/2022    [provider]  fluticasone (FLONASE) 50 MCG/ACT nasal spray Place 2 sprays into both nostrils daily.     [provider]  hydrOXYzine (ATARAX/VISTARIL) 50 MG tablet Take 50 mg by mouth every 6 (six) hours as needed.     [provider]  lidocaine (XYLOCAINE) 2 % solution Use as directed 15 mLs in the mouth or throat as needed for mouth pain. Patient not taking: Reported on 08/03/2022 04/13/19   Chesley Noon, MD  MAGNESIUM PO Take 1 tablet by mouth daily.    [provider]  metoCLOPramide (REGLAN) 10 MG tablet Take 1 tablet (10 mg total) by mouth every 6 (six) hours as needed. Patient not taking: Reported on 12/26/2021 02/28/19   Sharman Cheek, MD  metoCLOPramide (REGLAN) 10 MG tablet Take 1 tablet (10 mg total) by mouth every 8 (eight) hours as needed for refractory nausea / vomiting. Patient not taking: Reported on 12/26/2021 04/18/19   Felicie Morn, NP  ondansetron (ZOFRAN) 4 MG tablet Take 1 tablet (4 mg total) by mouth every 8 (eight) hours as needed for nausea or vomiting. 10/13/22   Curatolo, Adam, DO  zolpidem (AMBIEN) 10 MG tablet Take 10 mg by mouth at bedtime as needed for sleep. Patient not taking: Reported on 11/04/2022    [provider]    Family History Family History  Problem Relation Age of Onset   Fibromyalgia Mother     Social History Social History   Tobacco Use   Smoking status: Every Day    Packs/day: 1.00    Years: 40.00     Additional pack years: 0.00    Total pack years: 40.00    Types: Cigarettes   Smokeless tobacco: Current  Vaping Use   Vaping Use: Some days  Substance Use Topics   Alcohol use: Not Currently    Comment: occ   Drug use: Yes    Types: Marijuana     Allergies   Haldol [haloperidol] and Morphine and codeine  Review of Systems Review of Systems Pertinent negatives listed in HPI   Physical Exam Triage Vital Signs ED Triage Vitals  Enc Vitals Group     BP 11/04/22 1427 121/77     Pulse Rate 11/04/22 1427 (!) 103     Resp 11/04/22 1427 20     Temp 11/04/22 1427 98.2 F (36.8 C)  Temp Source 11/04/22 1427 Oral     SpO2 11/04/22 1427 95 %     Weight --      Height --      Head Circumference --      Peak Flow --      Pain Score 11/04/22 1425 8     Pain Loc --      Pain Edu? --      Excl. in GC? --    No data found.  Updated Vital Signs BP 121/77 (BP Location: Left Arm) Comment (BP Location): large cuff  Pulse (!) 103   Temp 98.2 F (36.8 C) (Oral)   Resp 20   SpO2 95%   Visual Acuity Right Eye Distance:   Left Eye Distance:   Bilateral Distance:    Right Eye Near:   Left Eye Near:    Bilateral Near:     Physical Exam Vitals reviewed.  Constitutional:      Appearance: Normal appearance.  HENT:     Head: Normocephalic and atraumatic.  Eyes:     Extraocular Movements: Extraocular movements intact.     Pupils: Pupils are equal, round, and reactive to light.  Cardiovascular:     Rate and Rhythm: Normal rate and regular rhythm.  Pulmonary:     Effort: Pulmonary effort is normal.     Breath sounds: Normal breath sounds.  Musculoskeletal:     Lumbar back: Spasms present. Decreased range of motion. Negative right straight leg raise test and negative left straight leg raise test.       Back:  Neurological:     General: No focal deficit present.     Mental Status: She is alert and oriented to person, place, and time.    UC Treatments / Results   Labs (all labs ordered are listed, but only abnormal results are displayed) Labs Reviewed - No data to display  EKG   Radiology No results found.  Procedures Procedures (including critical care time)  Medications Ordered in UC Medications  dexamethasone (DECADRON) injection 10 mg (has no administration in time range)  ketorolac (TORADOL) 30 MG/ML injection 30 mg (has no administration in time range)  ondansetron (ZOFRAN-ODT) disintegrating tablet 4 mg (has no administration in time range)    Initial Impression / Assessment and Plan / UC Course  I have reviewed the triage vital signs and the nursing notes.  Pertinent labs & imaging results that were available during my care of the patient were reviewed by me and considered in my medical decision making (see chart for details).    Acute bilateral low back without sciatica, treatment today with Decadron 10 mg IM and Toradol 30 mg IM. Home management of acute bilateral back pain with prednisone 40 mg once daily for 5 days as needed methocarbamol 500 mg every 8 hours as needed for muscle spasms or back pain.  Return precautions given if symptoms do not improve with treatment.  Strict ER precautions given if red flag symptoms develop.  Patient verbalized understanding and agreement with plan. Final Clinical Impressions(s) / UC Diagnoses   Final diagnoses:  Acute bilateral low back pain without sciatica     Discharge Instructions      Take medication as prescribed.  Recommend applying a heating pad to your lower back and avoid prolonged sitting as this can cause your back to stiffen. If at any point your symptoms worsen or do not improve with treatment return for evaluation.  If your symptoms become severe or if  you develop any weakness of your lower extremities go to the nearest emergency department.   ED Prescriptions     Medication Sig Dispense Auth. Provider   predniSONE (DELTASONE) 20 MG tablet Take 2 tablets (40 mg  total) by mouth daily with breakfast. 10 tablet Bing Neighbors, NP   methocarbamol (ROBAXIN) 500 MG tablet Take 1 tablet (500 mg total) by mouth every 8 (eight) hours as needed for muscle spasms (Back Pain). 20 tablet Bing Neighbors, NP      PDMP not reviewed this encounter.   Bing Neighbors, NP 11/04/22 1455

## 2022-11-09 ENCOUNTER — Ambulatory Visit
Admission: EM | Admit: 2022-11-09 | Discharge: 2022-11-09 | Disposition: A | Discharge status: Home / Self Care | Payer: 59

## 2022-11-09 DIAGNOSIS — M545 Low back pain, unspecified: Secondary | ICD-10-CM | POA: Diagnosis not present

## 2022-11-09 DIAGNOSIS — Z5900 Homelessness unspecified: Secondary | ICD-10-CM

## 2022-11-09 HISTORY — DX: Homelessness unspecified: Z59.00

## 2022-11-09 MED ORDER — PREDNISONE 20 MG PO TABS: 40.0000 mg | ORAL_TABLET | Freq: Every day | ORAL | 0 refills | Status: DC

## 2022-11-09 MED ORDER — ONDANSETRON 4 MG PO TBDP
4.0000 mg | ORAL_TABLET | Freq: Once | ORAL | Status: AC
  Administered 2022-11-09: 4 mg via ORAL

## 2022-11-09 MED ORDER — DEXAMETHASONE SODIUM PHOSPHATE 10 MG/ML IJ SOLN
10.0000 mg | Freq: Once | INTRAMUSCULAR | Status: AC
  Administered 2022-11-09: 10 mg via INTRAMUSCULAR

## 2022-11-09 MED ORDER — KETOROLAC TROMETHAMINE 30 MG/ML IJ SOLN
30.0000 mg | Freq: Once | INTRAMUSCULAR | Status: AC
  Administered 2022-11-09: 30 mg via INTRAMUSCULAR

## 2022-11-09 NOTE — ED Provider Notes (Signed)
[facility]    CSN: [id number] Arrival date & time: 11/09/22  1409      History   Chief Complaint Chief Complaint  Patient presents with   Back Pain    Currently Un housed.     HPI Shawna [name] is a 57 y.o. female.   Patient presents for evaluation of persisting bilateral low back pain present for 2 to 3 weeks.  Pain does not radiate, has been constant, worsened with long periods of sitting.  Denies injury or trauma.  Was evaluated in urgent care 5 days ago, injections in office helpful but has seen minimal improvement with use of oral prednisone and muscle relaxants but has not finished medication course.  Denies urinary or bowel incontinence.  History of degenerative disc disease.      Past Medical History:  Diagnosis Date   Anxiety    Asthma    Bulging lumbar disc    Bulging lumbar disc    COPD (chronic obstructive pulmonary disease) (HCC)    GERD (gastroesophageal reflux disease)    Homelessness 11/09/2022   Insomnia    Kidney stone    Pneumonia 03/2015   Sciatic leg pain    Right side   Sciatica of right side    Weakness    Bil knees, and ankles    Patient Active Problem List   Diagnosis Date Noted   Homelessness 11/09/2022   Adjustment disorder with mixed anxiety and depressed mood 10/14/2022   Suicide attempt (HCC) 08/03/2022   Visual hallucinations 07/30/2017   Colitis 01/22/2017   Dyslipidemia 07/26/2016   Low vitamin B12 level 07/26/2016   Major depressive disorder, 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   Overdose of sedative or hypnotic 07/23/2016   COPD exacerbation (HCC) 07/23/2016   GERD (gastroesophageal reflux disease) 07/23/2016   Cannabis use disorder, moderate, 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   Tobacco use disorder 07/23/2016   Sedative, hypnotic or anxiolytic use disorder, severe, 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   Bacterial pneumonia 01/23/2015   Leukocytosis 01/23/2015   Sepsis (HCC) 01/23/2015   Hypoxemia  01/23/2015   Opioid use disorder, moderate, dependence (HCC) 12/31/2014   Anxiety disorder, unspecified 12/31/2014    Past Surgical History:  Procedure Laterality Date   ABDOMINAL HYSTERECTOMY     Partial   ESOPHAGOGASTRODUODENOSCOPY (EGD) WITH PROPOFOL N/A 11/27/2015   Procedure: ESOPHAGOGASTRODUODENOSCOPY (EGD) WITH PROPOFOL;  Surgeon: Christena Deem, MD;  Location: [facility] ENDOSCOPY;  Service: Endoscopy;  Laterality: N/A;   SHOULDER SURGERY Right    TONSILLECTOMY     TUBAL LIGATION      OB History   No obstetric history on file.      Home Medications    Prior to Admission medications   Medication Sig Start Date End Date Taking? Authorizing Provider  albuterol (PROVENTIL HFA;VENTOLIN HFA) 108 (90 Base) MCG/ACT inhaler Inhale 2 puffs into the lungs every 6 (six) hours as needed for wheezing or shortness of breath. 01/16/16  Yes Jennye Moccasin, MD  clonazePAM (KLONOPIN) 1 MG tablet Take 0.5 mg by mouth 2 (two) times daily as needed for anxiety. 10/15/22  Yes [provider]  hydrOXYzine (VISTARIL) 50 MG capsule Take 100 mg by mouth every 8 (eight) hours as needed. 08/12/22  Yes [provider]  lidocaine (XYLOCAINE) 5 % ointment Apply 1 Application topically daily as needed. 08/17/22  Yes [provider]  ondansetron (ZOFRAN) 4 MG tablet Take 1 tablet (4 mg total) by mouth every 8 (eight) hours as needed for  nausea or vomiting. 10/13/22  Yes Curatolo, Adam, DO  predniSONE (DELTASONE) 20 MG tablet Take 2 tablets (40 mg total) by mouth daily with breakfast. 11/04/22  Yes Bing Neighbors, NP  acetaminophen (TYLENOL) 500 MG tablet Take 500 mg by mouth every 6 (six) hours as needed. Patient not taking: Reported on 12/26/2021    [provider]  aluminum-magnesium hydroxide-simethicone (MAALOX) 200-200-20 MG/5ML SUSP Take 30 mLs by mouth 4 (four) times daily -  before meals and at bedtime. Patient not taking: Reported on 08/03/2022 02/28/19   Sharman Cheek, MD  atorvastatin (LIPITOR) 10 MG tablet Take 10 mg by mouth at bedtime. 08/12/22   [provider]  buPROPion (WELLBUTRIN SR) 150 MG 12 hr tablet Take 150 mg by mouth daily. 11/03/22   [provider]  clonazePAM (KLONOPIN) 0.5 MG tablet Take 0.5 mg by mouth 2 (two) times daily as needed for anxiety.    [provider]  dexlansoprazole (DEXILANT) 60 MG capsule Take 1 capsule by mouth daily. Patient not taking: Reported on 12/26/2021    [provider]  dicyclomine (BENTYL) 20 MG tablet Take 1 tablet (20 mg total) by mouth 3 (three) times daily as needed for spasms. Patient not taking: Reported on 08/03/2022 03/04/19 12/26/21  Kem Boroughs B, FNP  dicyclomine (BENTYL) 20 MG tablet Take 1 tablet by mouth every 6 (six) hours. 11/06/22   [provider]  DULoxetine 40 MG CPEP Take 40 mg by mouth daily. Patient not taking: Reported on 12/26/2021 07/27/16   Pucilowska, Braulio Conte B, MD  famotidine (PEPCID) 20 MG tablet Take 1 tablet (20 mg total) by mouth 2 (two) times daily. Patient not taking: Reported on 08/03/2022 02/28/19   Sharman Cheek, MD  fexofenadine (ALLEGRA) 180 MG tablet Take 180 mg by mouth daily.    [provider]  FLUoxetine (PROZAC) 20 MG capsule Take 20 mg by mouth daily. 07/16/22   [provider]  FLUoxetine (PROZAC) 40 MG capsule Take 40 mg by mouth daily. Patient not taking: Reported on 11/04/2022    [provider]  fluticasone (FLONASE) 50 MCG/ACT nasal spray Place 2 sprays into both nostrils daily.     [provider]  hydrOXYzine (ATARAX) 25 MG tablet Take 25 mg by mouth 4 (four) times daily. 07/30/22   [provider]  hydrOXYzine (ATARAX/VISTARIL) 50 MG tablet Take 50 mg by mouth every 6 (six) hours as needed.     [provider]  lidocaine (XYLOCAINE) 2 % solution Use as directed 15 mLs in the mouth or throat as needed for mouth pain. Patient not taking: Reported on 08/03/2022 04/13/19    Chesley Noon, MD  MAGNESIUM PO Take 1 tablet by mouth daily.    [provider]  methocarbamol (ROBAXIN) 500 MG tablet Take 1 tablet (500 mg total) by mouth every 8 (eight) hours as needed for muscle spasms (Back Pain). 11/04/22   Bing Neighbors, NP  metoCLOPramide (REGLAN) 10 MG tablet Take 1 tablet (10 mg total) by mouth every 6 (six) hours as needed. Patient not taking: Reported on 12/26/2021 02/28/19   Sharman Cheek, MD  metoCLOPramide (REGLAN) 10 MG tablet Take 1 tablet (10 mg total) by mouth every 8 (eight) hours as needed for refractory nausea / vomiting. Patient not taking: Reported on 12/26/2021 04/18/19   Felicie Morn, NP  ondansetron (ZOFRAN-ODT) 8 MG disintegrating tablet Take 8 mg by mouth every 8 (eight) hours as needed for nausea or vomiting. 07/30/22   [provider]  rOPINIRole (REQUIP) 2 MG tablet Take 2 mg by mouth 2 (two) times daily. 08/17/22   [provider]  sucralfate (CARAFATE) 1 g tablet Take 1 g by mouth 4 (four) times daily. 08/17/22   [provider]  zolpidem (AMBIEN) 10 MG tablet Take 10 mg by mouth at bedtime as needed for sleep. Patient not taking: Reported on 11/04/2022    [provider]    Family History Family History  Problem Relation Age of Onset   Fibromyalgia Mother     Social History Social History   Tobacco Use   Smoking status: Every Day    Current packs/day: 1.00    Average packs/day: 1 pack/day for 40.0 years (40.0 ttl pk-yrs)    Types: Cigarettes   Smokeless tobacco: Current  Vaping Use   Vaping status: Some Days  Substance Use Topics   Alcohol use: Not Currently    Comment: occ   Drug use: Not Currently    Types: Marijuana     Allergies   Haloperidol, Morphine, Morphine and codeine, Pravastatin, Pravastatin sodium, and Fentanyl   Review of Systems Review of Systems  Musculoskeletal:  Positive for back pain.     Physical Exam Triage Vital Signs ED Triage Vitals  Encounter  Vitals Group     BP 11/09/22 1435 102/72     Systolic BP Percentile --      Diastolic BP Percentile --      Pulse Rate 11/09/22 1435 96     Resp 11/09/22 1435 18     Temp 11/09/22 1435 98.1 F (36.7 C)     Temp Source 11/09/22 1435 Oral     SpO2 11/09/22 1435 96 %     Weight 11/09/22 1431 175 lb 0.7 oz (79.4 kg)     Height 11/09/22 1431 5\' 6"  (1.676 m)     Head Circumference --      Peak Flow --      Pain Score 11/09/22 1431 10     Pain Loc --      Pain Education --      Exclude from Growth Chart --    No data found.  Updated Vital Signs BP 102/72 (BP Location: Left Arm)   Pulse 96   Temp 98.1 F (36.7 C) (Oral)   Resp 18   Ht 5\' 6"  (1.676 m)   Wt 175 lb 0.7 oz (79.4 kg)   SpO2 96%   BMI 28.25 kg/m   Visual Acuity Right Eye Distance:   Left Eye Distance:   Bilateral Distance:    Right Eye Near:   Left Eye Near:    Bilateral Near:     Physical Exam Constitutional:      Appearance: Normal appearance.  Eyes:     Extraocular Movements: Extraocular movements intact.  Pulmonary:     Effort: Pulmonary effort is normal.  Musculoskeletal:     Comments: Tenderness is generalized to the lumbar region without ecchymosis swelling or deformity, able to sit erect without complication, positive straight leg test  Neurological:     Mental Status: She is alert and oriented to person, place, and time. Mental status is at baseline.      UC Treatments / Results  Labs (all labs ordered are listed, but only abnormal results are displayed) Labs Reviewed - No data to display  EKG   Radiology No results found.  Procedures Procedures (including critical care time)  Medications Ordered in UC Medications - No data to display  Initial Impression / Assessment and Plan / UC Course  I have reviewed the triage vital signs and the nursing notes.  Pertinent labs & imaging results that were available during my care of the patient were reviewed by me and considered in my  medical decision making (see chart for details).  Acute bilateral low back pain without sciatica  Etiology most likely muscular, no spinal tenderness on exam, imaging unavailable in clinic today, has had success with intramuscular injection therefore Toradol and Decadron given in office today, endorses that pain causing nausea and therefore given oral Zofran, already has prescription at pharmacy from different provider, extended oral prednisone, sent to pharmacy, recommended continue supportive care through [facility], given walking referral to orthopedics if symptoms continue to persist or worsen Final Clinical Impressions(s) / UC Diagnoses   Final diagnoses:  None   Discharge Instructions   None    ED Prescriptions   None    I have reviewed the PDMP during this encounter.   Valinda Hoar, NP 11/09/22 1601

## 2022-11-09 NOTE — ED Triage Notes (Signed)
Here for "Back Pain". "I have been here once and it has just gotten worse becoming unbalanced". No previous or recent injury "just getting old".

## 2022-11-09 NOTE — Discharge Instructions (Signed)
Your pain is most likely caused by irritation to the muscles or ligaments.   You have been given an injection of Toradol and Decadron here today in the office, these medicines help to reduce inflammation and ideally will help reduce your pain  Continue to take steroids as directed, course has been extended an additional 5 days  You may use heating pad in 15 minute intervals as needed for additional comfort,  Begin stretching affected area daily for 10 minutes as tolerated to further loosen muscles   When lying down place pillow underneath and between knees for support  Practice good posture: head back, shoulders back, chest forward, pelvis back and weight distributed evenly on both legs  If pain persist after recommended treatment or reoccurs if may be beneficial to follow up with orthopedic specialist for evaluation, this doctor specializes in the bones and can manage your symptoms long-term with options such as but not limited to imaging, medications or physical therapy

## 2022-11-10 ENCOUNTER — Ambulatory Visit: Payer: 59

## 2022-11-10 ENCOUNTER — Ambulatory Visit
Admission: EM | Admit: 2022-11-10 | Discharge: 2022-11-10 | Disposition: A | Discharge status: Home / Self Care | Payer: 59 | Attending: Family Medicine | Admitting: Family Medicine

## 2022-11-10 DIAGNOSIS — M5416 Radiculopathy, lumbar region: Secondary | ICD-10-CM

## 2022-11-10 MED ORDER — TIZANIDINE HCL 4 MG PO TABS: 4.0000 mg | ORAL_TABLET | Freq: Three times a day (TID) | ORAL | 0 refills | Status: DC | PRN

## 2022-11-10 NOTE — ED Provider Notes (Signed)
[facility]    CSN: [id number] Arrival date & time: 11/10/22  1111      History   Chief Complaint Chief Complaint  Patient presents with   Back Pain    Follow up    HPI Shawna [name] is a 57 y.o. female.   HPI Patient has been seen here at urgent care 3 times since 11/04/2022, for ongoing bilateral lower back pain with sciatica. Patient has a history of bulging lumbar disc.  She has been treated with 2 rounds of prednisone and also received a muscle relaxer and Toradol injections.  She reports that the Toradol injections worked temporarily but the pain returned.  History of a herniated disc is currently not established with any spine specialist.  Past Medical History:  Diagnosis Date   Anxiety    Asthma    Bulging lumbar disc    Bulging lumbar disc    COPD (chronic obstructive pulmonary disease) (HCC)    GERD (gastroesophageal reflux disease)    Homelessness 11/09/2022   Insomnia    Kidney stone    Pneumonia 03/2015   Sciatic leg pain    Right side   Sciatica of right side    Weakness    Bil knees, and ankles    Patient Active Problem List   Diagnosis Date Noted   Homelessness 11/09/2022   Adjustment disorder with mixed anxiety and depressed mood 10/14/2022   Suicide attempt (HCC) 08/03/2022   Visual hallucinations 07/30/2017   Colitis 01/22/2017   Dyslipidemia 07/26/2016   Low vitamin B12 level 07/26/2016   Major depressive disorder, 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   Overdose of sedative or hypnotic 07/23/2016   COPD exacerbation (HCC) 07/23/2016   GERD (gastroesophageal reflux disease) 07/23/2016   Cannabis use disorder, moderate, 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   Tobacco use disorder 07/23/2016   Sedative, hypnotic or anxiolytic use disorder, severe, 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   Bacterial pneumonia 01/23/2015   Leukocytosis 01/23/2015   Sepsis (HCC) 01/23/2015   Hypoxemia 01/23/2015   Opioid use disorder, moderate,  dependence (HCC) 12/31/2014   Anxiety disorder, unspecified 12/31/2014    Past Surgical History:  Procedure Laterality Date   ABDOMINAL HYSTERECTOMY     Partial   ESOPHAGOGASTRODUODENOSCOPY (EGD) WITH PROPOFOL N/A 11/27/2015   Procedure: ESOPHAGOGASTRODUODENOSCOPY (EGD) WITH PROPOFOL;  Surgeon: Christena Deem, MD;  Location: [facility] ENDOSCOPY;  Service: Endoscopy;  Laterality: N/A;   SHOULDER SURGERY Right    TONSILLECTOMY     TUBAL LIGATION      OB History   No obstetric history on file.      Home Medications    Prior to Admission medications   Medication Sig Start Date End Date Taking? Authorizing Provider  albuterol (PROVENTIL HFA;VENTOLIN HFA) 108 (90 Base) MCG/ACT inhaler Inhale 2 puffs into the lungs every 6 (six) hours as needed for wheezing or shortness of breath. 01/16/16  Yes Jennye Moccasin, MD  clonazePAM (KLONOPIN) 1 MG tablet Take 0.5 mg by mouth 2 (two) times daily as needed for anxiety. 10/15/22  Yes [provider]  dicyclomine (BENTYL) 20 MG tablet Take 1 tablet by mouth every 6 (six) hours. 11/06/22  Yes [provider]  famotidine (PEPCID) 20 MG tablet Take 1 tablet (20 mg total) by mouth 2 (two) times daily. 02/28/19  Yes Sharman Cheek, MD  hydrOXYzine (ATARAX/VISTARIL) 50 MG tablet Take 50 mg by mouth every 6 (six) hours as needed.    Yes [provider]  rOPINIRole (REQUIP) 2  MG tablet Take 2 mg by mouth 2 (two) times daily. 08/17/22  Yes [provider]  tiZANidine (ZANAFLEX) 4 MG tablet Take 1 tablet (4 mg total) by mouth every 8 (eight) hours as needed for muscle spasms. 11/10/22  Yes Bing Neighbors, NP  acetaminophen (TYLENOL) 500 MG tablet Take 500 mg by mouth every 6 (six) hours as needed. Patient not taking: Reported on 12/26/2021    [provider]  aluminum-magnesium hydroxide-simethicone (MAALOX) 200-200-20 MG/5ML SUSP Take 30 mLs by mouth 4 (four) times daily -  before meals and at bedtime. Patient not  taking: Reported on 08/03/2022 02/28/19   Sharman Cheek, MD  atorvastatin (LIPITOR) 10 MG tablet Take 10 mg by mouth at bedtime. 08/12/22   [provider]  buPROPion (WELLBUTRIN SR) 150 MG 12 hr tablet Take 150 mg by mouth daily. 11/03/22   [provider]  clonazePAM (KLONOPIN) 0.5 MG tablet Take 0.5 mg by mouth 2 (two) times daily as needed for anxiety.    [provider]  dexlansoprazole (DEXILANT) 60 MG capsule Take 1 capsule by mouth daily. Patient not taking: Reported on 12/26/2021    [provider]  dicyclomine (BENTYL) 20 MG tablet Take 1 tablet (20 mg total) by mouth 3 (three) times daily as needed for spasms. Patient not taking: Reported on 08/03/2022 03/04/19 12/26/21  Kem Boroughs B, FNP  DULoxetine 40 MG CPEP Take 40 mg by mouth daily. Patient not taking: Reported on 12/26/2021 07/27/16   Pucilowska, Braulio Conte B, MD  fexofenadine (ALLEGRA) 180 MG tablet Take 180 mg by mouth daily.    [provider]  FLUoxetine (PROZAC) 20 MG capsule Take 20 mg by mouth daily. 07/16/22   [provider]  FLUoxetine (PROZAC) 40 MG capsule Take 40 mg by mouth daily. Patient not taking: Reported on 11/04/2022    [provider]  fluticasone (FLONASE) 50 MCG/ACT nasal spray Place 2 sprays into both nostrils daily.     [provider]  hydrOXYzine (ATARAX) 25 MG tablet Take 25 mg by mouth 4 (four) times daily. 07/30/22   [provider]  hydrOXYzine (VISTARIL) 50 MG capsule Take 100 mg by mouth every 8 (eight) hours as needed. 08/12/22   [provider]  lidocaine (XYLOCAINE) 2 % solution Use as directed 15 mLs in the mouth or throat as needed for mouth pain. Patient not taking: Reported on 08/03/2022 04/13/19   Chesley Noon, MD  lidocaine (XYLOCAINE) 5 % ointment Apply 1 Application topically daily as needed. 08/17/22   [provider]  MAGNESIUM PO Take 1 tablet by mouth daily.    [provider]  metoCLOPramide  (REGLAN) 10 MG tablet Take 1 tablet (10 mg total) by mouth every 6 (six) hours as needed. Patient not taking: Reported on 12/26/2021 02/28/19   Sharman Cheek, MD  metoCLOPramide (REGLAN) 10 MG tablet Take 1 tablet (10 mg total) by mouth every 8 (eight) hours as needed for refractory nausea / vomiting. Patient not taking: Reported on 12/26/2021 04/18/19   Felicie Morn, NP  ondansetron (ZOFRAN) 4 MG tablet Take 1 tablet (4 mg total) by mouth every 8 (eight) hours as needed for nausea or vomiting. 10/13/22   Curatolo, Adam, DO  ondansetron (ZOFRAN-ODT) 8 MG disintegrating tablet Take 8 mg by mouth every 8 (eight) hours as needed for nausea or vomiting. 07/30/22   [provider]  predniSONE (DELTASONE) 20 MG tablet Take 2 tablets (40 mg total) by mouth daily with breakfast. 11/04/22  Bing Neighbors, NP  predniSONE (DELTASONE) 20 MG tablet Take 2 tablets (40 mg total) by mouth daily. 11/09/22   White, Elita Boone, NP  sucralfate (CARAFATE) 1 g tablet Take 1 g by mouth 4 (four) times daily. 08/17/22   [provider]  zolpidem (AMBIEN) 10 MG tablet Take 10 mg by mouth at bedtime as needed for sleep. Patient not taking: Reported on 11/04/2022    [provider]    Family History Family History  Problem Relation Age of Onset   Fibromyalgia Mother     Social History Social History   Tobacco Use   Smoking status: Every Day    Current packs/day: 1.00    Average packs/day: 1 pack/day for 40.0 years (40.0 ttl pk-yrs)    Types: Cigarettes   Smokeless tobacco: Current  Vaping Use   Vaping status: Some Days   Substances: Nicotine, Flavoring  Substance Use Topics   Alcohol use: Not Currently    Comment: occ   Drug use: Yes    Types: Marijuana    Comment: once in while     Allergies   Haloperidol, Morphine, Morphine and codeine, Pravastatin, Pravastatin sodium, and Fentanyl  Review of Systems Review of Systems Pertinent negatives listed in HPI   Physical  Exam Triage Vital Signs ED Triage Vitals  Encounter Vitals Group     BP 11/10/22 1155 103/72     Systolic BP Percentile --      Diastolic BP Percentile --      Pulse Rate 11/10/22 1155 79     Resp 11/10/22 1155 18     Temp 11/10/22 1155 97.9 F (36.6 C)     Temp Source 11/10/22 1155 Oral     SpO2 11/10/22 1155 96 %     Weight 11/10/22 1152 170 lb (77.1 kg)     Height 11/10/22 1152 5\' 6"  (1.676 m)     Head Circumference --      Peak Flow --      Pain Score 11/10/22 1151 10     Pain Loc --      Pain Education --      Exclude from Growth Chart --    No data found.  Updated Vital Signs BP 103/72 (BP Location: Left Arm)   Pulse 79   Temp 97.9 F (36.6 C) (Oral)   Resp 18   Ht 5\' 6"  (1.676 m)   Wt 170 lb (77.1 kg)   SpO2 96%   BMI 27.44 kg/m   Visual Acuity Right Eye Distance:   Left Eye Distance:   Bilateral Distance:    Right Eye Near:   Left Eye Near:    Bilateral Near:     Physical Exam Constitutional:      Appearance: Normal appearance.  HENT:     Head: Normocephalic and atraumatic.  Eyes:     Extraocular Movements: Extraocular movements intact.     Pupils: Pupils are equal, round, and reactive to light.  Cardiovascular:     Rate and Rhythm: Normal rate and regular rhythm.  Pulmonary:     Effort: Pulmonary effort is normal.     Breath sounds: Normal breath sounds.  Musculoskeletal:     Lumbar back: Spasms present. Decreased range of motion. Negative right straight leg raise test and negative left straight leg raise test.       Back:  Neurological:     General: No focal deficit present.     Mental Status: She is alert and oriented  to person, place, and time.     UC Treatments / Results  Labs (all labs ordered are listed, but only abnormal results are displayed) Labs Reviewed - No data to display  EKG   Radiology DG Lumbar Spine Complete  Result Date: 11/10/2022 CLINICAL DATA:  Worsening back pain. EXAM: LUMBAR SPINE - COMPLETE 4+ VIEW  COMPARISON:  None Available. FINDINGS: There is no evidence of lumbar spine fracture. Alignment is normal. Mild multilevel degenerate disc disease prominent at L5-S1 with disc height loss and associated facet joint arthropathy. 7 mm calculus projecting over the lower pole of the left kidney. IMPRESSION: Mild multilevel degenerate disc disease prominent at L5-S1. Electronically Signed   By: Larose Hires D.O.   On: 11/10/2022 13:22    Procedures Procedures (including critical care time)  Medications Ordered in UC Medications - No data to display  Initial Impression / Assessment and Plan / UC Course  I have reviewed the triage vital signs and the nursing notes.  Pertinent labs & imaging results that were available during my care of the patient were reviewed by me and considered in my medical decision making (see chart for details).   Lumbar radiculopathy, patient is currently being treated with her second round of prednisone x 1 day to use complaining of significant pain.  Obtained a lumbar spine x-ray which showed a 7 mm kidney stone on the left kidney however per patient this is been chronic and she is aware of this and does not feel this is the reason for her pain.  Patient advised to follow-up at [facility] given that she is not responding to treatment that we have prescribed here at urgent care it is time for her to follow-up with specialist.  I did offer a different muscle relaxer, tizanidine 4 mg every 8 hours as needed for pain. Patient reports that she will go to [facility] urgent care.  Patient is stable ambulatory verbalized understanding agreement with plan. Final Clinical Impressions(s) / UC Diagnoses   Final diagnoses:  Lumbar radiculopathy     Discharge Instructions      Continue prednisone. Given back pain is worsening with two rounds prednisone, I recommend evaluation by the restless for further workup and evaluation of your symptoms.  I will prescribe you a different muscle  relaxer since the Robaxin did not help.     ED Prescriptions     Medication Sig Dispense Auth. Provider   tiZANidine (ZANAFLEX) 4 MG tablet Take 1 tablet (4 mg total) by mouth every 8 (eight) hours as needed for muscle spasms. 20 tablet Bing Neighbors, NP      PDMP not reviewed this encounter.   Bing Neighbors, NP 11/10/22 [phone]

## 2022-11-10 NOTE — Discharge Instructions (Addendum)
Continue prednisone. Given back pain is worsening with two rounds prednisone, I recommend evaluation by the restless for further workup and evaluation of your symptoms.  I will prescribe you a different muscle relaxer since the Robaxin did not help.

## 2022-11-10 NOTE — ED Triage Notes (Signed)
"  Back Pain not improving". I fell this morning "tripped over my own feet. Fell from standing to lying on floor. No abrasions. No lacerations. No head injury. No loc.

## 2022-12-16 ENCOUNTER — Ambulatory Visit
Admission: EM | Admit: 2022-12-16 | Discharge: 2022-12-16 | Discharge status: Against Medical Advice | Payer: 59 | Attending: Physician Assistant | Admitting: Physician Assistant

## 2022-12-16 DIAGNOSIS — R112 Nausea with vomiting, unspecified: Secondary | ICD-10-CM

## 2022-12-16 MED ORDER — ONDANSETRON HCL 4 MG/2ML IJ SOLN
4.0000 mg | Freq: Once | INTRAMUSCULAR | Status: AC
  Administered 2022-12-16: 4 mg via INTRAMUSCULAR

## 2022-12-16 NOTE — ED Triage Notes (Signed)
Patients requests "antibiotic shot" if necessary.

## 2022-12-16 NOTE — ED Triage Notes (Signed)
"  For about a week now I have had vomiting on/off due to food poisoning". Last emesis "11pm, 12-15-2022". Voids "fine". Stools "normal".

## 2022-12-16 NOTE — ED Notes (Signed)
Patient recommended to go to the ED for further evaluation. She refused. AMA form completed.

## 2022-12-16 NOTE — Discharge Instructions (Signed)
  It is recommended that you report to local emergency dept for further evaluation of symptoms.

## 2023-01-02 ENCOUNTER — Emergency Department
Admission: EM | Admit: 2023-01-02 | Discharge: 2023-01-02 | Disposition: A | Discharge status: Home / Self Care | Payer: 59 | Attending: Student in an Organized Health Care Education/Training Program | Admitting: Student in an Organized Health Care Education/Training Program

## 2023-01-02 ENCOUNTER — Other Ambulatory Visit: Payer: Self-pay

## 2023-01-02 DIAGNOSIS — Z5900 Homelessness unspecified: Secondary | ICD-10-CM | POA: Insufficient documentation

## 2023-01-02 DIAGNOSIS — F1310 Sedative, hypnotic or anxiolytic abuse, uncomplicated: Secondary | ICD-10-CM | POA: Insufficient documentation

## 2023-01-02 DIAGNOSIS — R45851 Suicidal ideations: Secondary | ICD-10-CM

## 2023-01-02 DIAGNOSIS — F1210 Cannabis abuse, uncomplicated: Secondary | ICD-10-CM | POA: Insufficient documentation

## 2023-01-02 DIAGNOSIS — J45909 Unspecified asthma, uncomplicated: Secondary | ICD-10-CM | POA: Diagnosis not present

## 2023-01-02 DIAGNOSIS — J449 Chronic obstructive pulmonary disease, unspecified: Secondary | ICD-10-CM | POA: Insufficient documentation

## 2023-01-02 DIAGNOSIS — F17210 Nicotine dependence, cigarettes, uncomplicated: Secondary | ICD-10-CM | POA: Insufficient documentation

## 2023-01-02 DIAGNOSIS — Y900 Blood alcohol level of less than 20 mg/100 ml: Secondary | ICD-10-CM | POA: Diagnosis not present

## 2023-01-02 DIAGNOSIS — F32A Depression, unspecified: Secondary | ICD-10-CM

## 2023-01-02 LAB — URINE DRUG SCREEN, QUALITATIVE (ARMC ONLY)
Amphetamines, Ur Screen: NOT DETECTED
Barbiturates, Ur Screen: NOT DETECTED
Benzodiazepine, Ur Scrn: POSITIVE — AB
Cannabinoid 50 Ng, Ur ~~LOC~~: POSITIVE — AB
Cocaine Metabolite,Ur ~~LOC~~: NOT DETECTED
MDMA (Ecstasy)Ur Screen: NOT DETECTED
Methadone Scn, Ur: NOT DETECTED
Opiate, Ur Screen: NOT DETECTED
Phencyclidine (PCP) Ur S: NOT DETECTED
Tricyclic, Ur Screen: POSITIVE — AB

## 2023-01-02 LAB — ACETAMINOPHEN LEVEL: Acetaminophen (Tylenol), Serum: <10 ug/mL — ABNORMAL LOW (ref 10–30)

## 2023-01-02 LAB — COMPREHENSIVE METABOLIC PANEL
ALT: 17 U/L (ref 0–44)
AST: 20 U/L (ref 15–41)
Albumin: 3.9 g/dL (ref 3.5–5.0)
Alkaline Phosphatase: 93 U/L (ref 38–126)
Anion gap: 10 (ref 5–15)
BUN: 9 mg/dL (ref 6–20)
CO2: 22 mmol/L (ref 22–32)
Calcium: 9 mg/dL (ref 8.9–10.3)
Chloride: 103 mmol/L (ref 98–111)
Creatinine, Ser: 0.7 mg/dL (ref 0.44–1.00)
GFR, Estimated: >60 mL/min (ref >60)
Glucose, Bld: 157 mg/dL — ABNORMAL HIGH (ref 70–99)
Potassium: 3.8 mmol/L (ref 3.5–5.1)
Sodium: 135 mmol/L (ref 135–145)
Total Bilirubin: 0.8 mg/dL (ref 0.3–1.2)
Total Protein: 7.4 g/dL (ref 6.5–8.1)

## 2023-01-02 LAB — CBC
HCT: 39.9 % (ref 36.0–46.0)
Hemoglobin: 14 g/dL (ref 12.0–15.0)
MCH: 30.2 pg (ref 26.0–34.0)
MCHC: 35.1 g/dL (ref 30.0–36.0)
MCV: 86.2 fL (ref 80.0–100.0)
Platelets: 382 10*3/uL (ref 150–400)
RBC: 4.63 MIL/uL (ref 3.87–5.11)
RDW: 13 % (ref 11.5–15.5)
WBC: 9.8 10*3/uL (ref 4.0–10.5)
nRBC: 0 % (ref 0.0–0.2)

## 2023-01-02 LAB — ETHANOL: Alcohol, Ethyl (B): <10 mg/dL (ref <10)

## 2023-01-02 LAB — SALICYLATE LEVEL: Salicylate Lvl: <7 mg/dL — ABNORMAL LOW (ref 7.0–30.0)

## 2023-01-02 MED ORDER — IBUPROFEN 600 MG PO TABS
600.0000 mg | ORAL_TABLET | Freq: Four times a day (QID) | ORAL | Status: DC | PRN
  Administered 2023-01-02: 600 mg via ORAL
  Filled 2023-01-02: qty 1

## 2023-01-02 NOTE — ED Notes (Signed)
Pt. Transferred to ED , room# 24H from triage .Patient was changed into maroon scrubs, screened by security, then familiarized with unit/bathroom. Report to include Situation, Background, Assessment and Recommendations from [place], [place] . Pt. Oriented to unit including Q15 minute rounds as well as the security cameras for their protection. Patient is alert and oriented, warm and dry in no acute distress.

## 2023-01-02 NOTE — Consult Note (Addendum)
[facility] Face-to-Face Psychiatry Consult   Reason for Consult:  self-harming thoughts Referring Physician:  Willy Eddy MD.  Patient Identification: Shawna [name] MRN:  [id number] Principal Diagnosis: <principal problem not specified> Diagnosis:  Active Problems:   Depression   Total Time spent with patient: 20 minutes  Subjective:   Shawna [name] is a 57 y.o. female patient presented to [facility] ED with self harming thoughts, voluntarily. "I wake up crying at night".  HPI:  Shawna [name] is a 57 y.o. female patient presented to [facility] ED with self harming thoughts. She reports having self harming thoughts, stating "I lost my sister on March 12th and I was at her house the other week". Patient reports waking up crying at night. Patient reports "I feel like I want to hurt myself". Patient confirmed that self harming thought was only a thought but was able to contract for safety, stating "I have hurt my daughter enough, I would never do that to her", referring to committing suicide. Patient confirmed that she has no plan for suicide or intent. Patient reports that she previously attempted suicide "a while back" by taking "pills". Patient reports that after this suicide attempt she had a 2 week inpatient psychiatric hospitalizations in [place], [place]. She was unable to recall the facility's name. Patient reports that she is established with Dr. Howell Rucks at [facility] and is compliant with appointments and medications. Patient reports that she has an upcoming appointment with her psychiatrist. She reports that her medication regimen consists of hydroxyzine to help with anxiety/sleep. Patient reports previously taking Wellbutrin for depression which was ineffective. She reports taking Prozac which worsened her symptoms. She is not open to medication adjustments today, as she states that all antidepressants worsen her symptoms. Patient reports smoking marijuana to help calm her and to  help her sleep. She reports last using marijuana last night. Patient states she uses whenever she can get her hands on it. She reports starting mariajuana use at age 79 yo.  Later during the visit, patient reports wanting to stay inpatient psychiatry due to loneliness Patient reports having multiple life stressors and feels alone at home, living with her daughter and her daughter's boyfriend. Patient reports that her daughter's boyfriend is at the "trailer but stays in his room". She does report having an emotional support cat. Patient reports losing her section 8 housing after her landlord needed to renovate and she was unable to find alternate housing in time. Patient reports plans to apply for section 8 again and states the waiting list is one year. Patient reports having hobbies of crocheting. She appears to be lonely and recommendations were offered for patient to receive person-to-person interaction that were close by to her home in [place], [place] off of [address]. Patient confirmed that she will follow up with Dr. Howell Rucks at her upcoming appointment to initiate therapy services to address the increase in depressive symptoms surrounding her sister's death in 2022/07/29. Patient reports that she will also follow up with her PCP to get any assistive devices to ensure she can live a full life, since reporting that she has difficulty ambulating longer distances and can't walk around stores. Patient also reports needing transportation since she no longer can afford gas for her vehicle. Patient reports receiving approx $900/month from [employer]. Patient offered a resource to follow up on for transportation services, as well. Patient informed to contact [phone] if feeling depressed and having thoughts of self harm. Patient reports  that she is familiar with the hotline and confirmed that she will contact prior to self harming.  Patient refused to allow this writer to contact her daughter for collateral.  Past  Psychiatric History: Per record, opoid use disorder, anxiety, MDD, cannabis use disorder, tobacco use disorder, suicide attempt 8 years ago, homelessness, chronic prescription benzodiazapine use, unspecified schizophrenia spectrum and other psychotic disorder. Per patient, "depression mostly"  Risk to Self:  self harming thoughts, no plan for suicide, no intent for suicide Risk to Others:  denies Prior Inpatient Therapy:  yes see above Prior Outpatient Therapy:  yes see above  Past Medical History:  Past Medical History:  Diagnosis Date   Anxiety    Asthma    Bulging lumbar disc    Bulging lumbar disc    COPD (chronic obstructive pulmonary disease) (HCC)    GERD (gastroesophageal reflux disease)    Homelessness 11/09/2022   Insomnia    Kidney stone    Pneumonia 03/2015   Sciatic leg pain    Right side   Sciatica of right side    Weakness    Bil knees, and ankles    Past Surgical History:  Procedure Laterality Date   ABDOMINAL HYSTERECTOMY     Partial   ESOPHAGOGASTRODUODENOSCOPY (EGD) WITH PROPOFOL N/A 11/27/2015   Procedure: ESOPHAGOGASTRODUODENOSCOPY (EGD) WITH PROPOFOL;  Surgeon: Christena Deem, MD;  Location: [facility] ENDOSCOPY;  Service: Endoscopy;  Laterality: N/A;   SHOULDER SURGERY Right    TONSILLECTOMY     TUBAL LIGATION     Family History:  Family History  Problem Relation Age of Onset   Fibromyalgia Mother    Family Psychiatric  History: none reported  Social History:  Social History   Substance and Sexual Activity  Alcohol Use Not Currently   Comment: occ     Social History   Substance and Sexual Activity  Drug Use Yes   Types: Marijuana   Comment: once in while    Social History   Socioeconomic History   Marital status: Single    Spouse name: Not on file   Number of children: Not on file   Years of education: Not on file   Highest education level: Not on file  Occupational History   Not on file  Tobacco Use   Smoking status: Every Day     Current packs/day: 1.00    Average packs/day: 1 pack/day for 40.0 years (40.0 ttl pk-yrs)    Types: Cigarettes   Smokeless tobacco: Current  Vaping Use   Vaping status: Some Days   Substances: Nicotine, Flavoring  Substance and Sexual Activity   Alcohol use: Not Currently    Comment: occ   Drug use: Yes    Types: Marijuana    Comment: once in while   Sexual activity: Not Currently  Other Topics Concern   Not on file  Social History Narrative   Not on file   Social Determinants of Health   Financial Resource Strain: Not on file  Food Insecurity: No Food Insecurity (02/10/2019)   Received from [facility]   Hunger Vital Sign    Worried About Running Out of Food in the Last Year: Never true    Ran Out of Food in the Last Year: Never true  Transportation Needs: Not on file  Physical Activity: Not on file  Stress: Not on file  Social Connections: Not on file   Additional Social History:    Allergies:   Allergies  Allergen  Reactions   Haloperidol     Made pt "feel really weird".  Other Reaction(s): Unknown   Hydrocodone-Acetaminophen Nausea And Vomiting and Nausea Only    Other Reaction(s): Vomiting   Morphine Nausea And Vomiting    Other Reaction(s): Unknown  Patient states this medication makes her have sever flu symptoms.   Morphine And Codeine Nausea And Vomiting    Patient states this medication makes her have sever flu symptoms.   Pravastatin     Other Reaction(s): Other (See Comments)  CHEST PAIN   Pravastatin Sodium     Other Reaction(s): Other (See Comments)   Promethazine Hcl     Other Reaction(s): Unknown   Fentanyl Nausea And Vomiting and Other (See Comments)    Patient states this makes her have severe flu symptoms.   Propofol Nausea And Vomiting    Other Reaction(s): Unknown    Labs:  Results for orders placed or performed during the hospital encounter of 01/02/23 (from the past 48 hour(s))  Comprehensive metabolic panel      Status: Abnormal   Collection Time: 01/02/23 11:52 AM  Result Value Ref Range   Sodium 135 135 - 145 mmol/L   Potassium 3.8 3.5 - 5.1 mmol/L   Chloride 103 98 - 111 mmol/L   CO2 22 22 - 32 mmol/L   Glucose, Bld 157 (H) 70 - 99 mg/dL    Comment: Glucose reference range applies only to samples taken after fasting for at least 8 hours.   BUN 9 6 - 20 mg/dL   Creatinine, Ser 4.09 0.44 - 1.00 mg/dL   Calcium 9.0 8.9 - 81.1 mg/dL   Total Protein 7.4 6.5 - 8.1 g/dL   Albumin 3.9 3.5 - 5.0 g/dL   AST 20 15 - 41 U/L   ALT 17 0 - 44 U/L   Alkaline Phosphatase 93 38 - 126 U/L   Total Bilirubin 0.8 0.3 - 1.2 mg/dL   GFR, Estimated >91 >47 mL/min    Comment: (NOTE) Calculated using the CKD-EPI Creatinine Equation (2021)    Anion gap 10 5 - 15    Comment: Performed at [facility], [address] [place], [place] [address]  Ethanol     Status: None   Collection Time: 01/02/23 11:52 AM  Result Value Ref Range   Alcohol, Ethyl (B) <10 <10 mg/dL    Comment: (NOTE) Lowest detectable limit for serum alcohol is 10 mg/dL.  For medical purposes only. Performed at [facility], [address] [place], [place] [address]   Salicylate level     Status: Abnormal   Collection Time: 01/02/23 11:52 AM  Result Value Ref Range   Salicylate Lvl <7.0 (L) 7.0 - 30.0 mg/dL    Comment: Performed at [facility], [address] [place], [place] [address]  Acetaminophen level     Status: Abnormal   Collection Time: 01/02/23 11:52 AM  Result Value Ref Range   Acetaminophen (Tylenol), Serum <10 (L) 10 - 30 ug/mL    Comment: (NOTE) Therapeutic concentrations vary significantly. A range of 10-30 ug/mL  may be an effective concentration for many patients. However, some  are best treated at concentrations outside of this range. Acetaminophen concentrations >150 ug/mL at 4 hours after ingestion  and >50 ug/mL at 12 hours after ingestion are often associated with  toxic  reactions.  Performed at [facility], [address], [place], [place] [address]   cbc     Status: None   Collection  Time: 01/02/23 11:52 AM  Result Value Ref Range   WBC 9.8 4.0 - 10.5 K/uL   RBC 4.63 3.87 - 5.11 MIL/uL   Hemoglobin 14.0 12.0 - 15.0 g/dL   HCT 96.0 45.4 - 09.8 %   MCV 86.2 80.0 - 100.0 fL   MCH 30.2 26.0 - 34.0 pg   MCHC 35.1 30.0 - 36.0 g/dL   RDW 11.9 14.7 - 82.9 %   Platelets 382 150 - 400 K/uL   nRBC 0.0 0.0 - 0.2 %    Comment: Performed at [facility], [address], [place], [place] [address]  Urine Drug Screen, Qualitative     Status: Abnormal   Collection Time: 01/02/23 11:52 AM  Result Value Ref Range   Tricyclic, Ur Screen POSITIVE (A) NONE DETECTED   Amphetamines, Ur Screen NONE DETECTED NONE DETECTED   MDMA (Ecstasy)Ur Screen NONE DETECTED NONE DETECTED   Cocaine Metabolite,Ur [place] NONE DETECTED NONE DETECTED   Opiate, Ur Screen NONE DETECTED NONE DETECTED   Phencyclidine (PCP) Ur S NONE DETECTED NONE DETECTED   Cannabinoid 50 Ng, Ur [place] POSITIVE (A) NONE DETECTED   Barbiturates, Ur Screen NONE DETECTED NONE DETECTED   Benzodiazepine, Ur Scrn POSITIVE (A) NONE DETECTED   Methadone Scn, Ur NONE DETECTED NONE DETECTED    Comment: (NOTE) Tricyclics + metabolites, urine    Cutoff 1000 ng/mL Amphetamines + metabolites, urine  Cutoff 1000 ng/mL MDMA (Ecstasy), urine              Cutoff 500 ng/mL Cocaine Metabolite, urine          Cutoff 300 ng/mL Opiate + metabolites, urine        Cutoff 300 ng/mL Phencyclidine (PCP), urine         Cutoff 25 ng/mL Cannabinoid, urine                 Cutoff 50 ng/mL Barbiturates + metabolites, urine  Cutoff 200 ng/mL Benzodiazepine, urine              Cutoff 200 ng/mL Methadone, urine                   Cutoff 300 ng/mL  The urine drug screen provides only a preliminary, unconfirmed analytical test result and should not be used for non-medical purposes. Clinical consideration and professional  judgment should be applied to any positive drug screen result due to possible interfering substances. A more specific alternate chemical method must be used in order to obtain a confirmed analytical result. Gas chromatography / mass spectrometry (GC/MS) is the preferred confirm atory method. Performed at [facility], [address] [place], [place] [address]     Current Facility-Administered Medications  Medication Dose Route Frequency Provider Last Rate Last Admin   ibuprofen (ADVIL) tablet 600 mg  600 mg Oral Q6H PRN Willy Eddy, MD   600 mg at 01/02/23 1422   Current Outpatient Medications  Medication Sig Dispense Refill   acetaminophen (TYLENOL) 500 MG tablet Take 500 mg by mouth every 6 (six) hours as needed. (Patient not taking: Reported on 12/26/2021)     albuterol (PROVENTIL HFA;VENTOLIN HFA) 108 (90 Base) MCG/ACT inhaler Inhale 2 puffs into the lungs every 6 (six) hours as needed for wheezing or shortness of breath. 1 Inhaler 2   aluminum-magnesium hydroxide-simethicone (MAALOX) 200-200-20 MG/5ML SUSP Take 30 mLs by mouth 4 (four) times daily -  before meals and at bedtime. (Patient not taking: Reported on 08/03/2022) 355 mL 0  atorvastatin (LIPITOR) 10 MG tablet Take 10 mg by mouth at bedtime.     buPROPion (WELLBUTRIN SR) 150 MG 12 hr tablet Take 150 mg by mouth daily.     clonazePAM (KLONOPIN) 0.5 MG tablet Take 0.5 mg by mouth 2 (two) times daily as needed for anxiety.     clonazePAM (KLONOPIN) 1 MG tablet Take 0.5 mg by mouth 2 (two) times daily as needed for anxiety.     dexlansoprazole (DEXILANT) 60 MG capsule Take 1 capsule by mouth daily. (Patient not taking: Reported on 12/26/2021)     dicyclomine (BENTYL) 20 MG tablet Take 1 tablet (20 mg total) by mouth 3 (three) times daily as needed for spasms. (Patient not taking: Reported on 08/03/2022) 30 tablet 0   dicyclomine (BENTYL) 20 MG tablet Take 1 tablet by mouth every 6 (six) hours.     DULoxetine 40 MG CPEP  Take 40 mg by mouth daily. (Patient not taking: Reported on 12/26/2021) 30 capsule 1   famotidine (PEPCID) 20 MG tablet Take 1 tablet (20 mg total) by mouth 2 (two) times daily. 60 tablet 0   fexofenadine (ALLEGRA) 180 MG tablet Take 180 mg by mouth daily.     FLUoxetine (PROZAC) 20 MG capsule Take 20 mg by mouth daily.     FLUoxetine (PROZAC) 40 MG capsule Take 40 mg by mouth daily. (Patient not taking: Reported on 11/04/2022)     fluticasone (FLONASE) 50 MCG/ACT nasal spray Place 2 sprays into both nostrils daily.      hydrOXYzine (ATARAX) 25 MG tablet Take 25 mg by mouth 4 (four) times daily.     hydrOXYzine (ATARAX/VISTARIL) 50 MG tablet Take 50 mg by mouth every 6 (six) hours as needed.      hydrOXYzine (VISTARIL) 50 MG capsule Take 100 mg by mouth every 8 (eight) hours as needed.     lidocaine (XYLOCAINE) 2 % solution Use as directed 15 mLs in the mouth or throat as needed for mouth pain. (Patient not taking: Reported on 08/03/2022) 100 mL 0   lidocaine (XYLOCAINE) 5 % ointment Apply 1 Application topically daily as needed.     MAGNESIUM PO Take 1 tablet by mouth daily.     metoCLOPramide (REGLAN) 10 MG tablet Take 1 tablet (10 mg total) by mouth every 6 (six) hours as needed. (Patient not taking: Reported on 12/26/2021) 30 tablet 0   metoCLOPramide (REGLAN) 10 MG tablet Take 1 tablet (10 mg total) by mouth every 8 (eight) hours as needed for refractory nausea / vomiting. (Patient not taking: Reported on 12/26/2021) 20 tablet 0   ondansetron (ZOFRAN) 4 MG tablet Take 1 tablet (4 mg total) by mouth every 8 (eight) hours as needed for nausea or vomiting. 12 tablet 0   ondansetron (ZOFRAN-ODT) 8 MG disintegrating tablet Take 8 mg by mouth every 8 (eight) hours as needed for nausea or vomiting.     predniSONE (DELTASONE) 20 MG tablet Take 2 tablets (40 mg total) by mouth daily with breakfast. 10 tablet 0   predniSONE (DELTASONE) 20 MG tablet Take 2 tablets (40 mg total) by mouth daily. 10 tablet 0    rOPINIRole (REQUIP) 2 MG tablet Take 2 mg by mouth 2 (two) times daily.     sucralfate (CARAFATE) 1 g tablet Take 1 g by mouth 4 (four) times daily.     tiZANidine (ZANAFLEX) 4 MG tablet Take 1 tablet (4 mg total) by mouth every 8 (eight) hours as needed for muscle spasms. 20 tablet 0  zolpidem (AMBIEN) 10 MG tablet Take 10 mg by mouth at bedtime as needed for sleep. (Patient not taking: Reported on 11/04/2022)      Musculoskeletal: Strength & Muscle Tone: within normal limits Gait & Station: unsteady Patient leans: Front            Psychiatric Specialty Exam:  Presentation  General Appearance:  Appropriate for Environment  Eye Contact: Good  Speech: Clear and Coherent  Speech Volume: Normal  Handedness: Right   Mood and Affect  Mood: Depressed  Affect: Congruent   Thought Process  Thought Processes: Coherent  Descriptions of Associations:Intact  Orientation:Full (Time, Place and Person)  Thought Content:Logical  History of Schizophrenia/Schizoaffective disorder:No  Duration of Psychotic Symptoms:No data recorded Hallucinations:Hallucinations: None  Ideas of Reference:None  Suicidal Thoughts:Suicidal Thoughts: Yes, Passive SI Passive Intent and/or Plan: Without Intent; Without Plan  Homicidal Thoughts:Homicidal Thoughts: No   Sensorium  Memory: Immediate Good; Recent Good; Remote Good  Judgment: Good  Insight: Good   Executive Functions  Concentration: Good  Attention Span: Good  Recall: Good  Fund of Knowledge: Good  Language: Good   Psychomotor Activity  Psychomotor Activity:Psychomotor Activity: Normal   Assets  Assets: Communication Skills   Sleep  Sleep:Sleep: Fair   Physical Exam: Physical Exam Vitals and nursing note reviewed.  Neurological:     General: No focal deficit present.     Mental Status: She is oriented to person, place, and time.    Review of Systems  Psychiatric/Behavioral:   Positive for depression and substance abuse. Negative for hallucinations and memory loss. The patient is not nervous/anxious.        Self harming thoughts  All other systems reviewed and are negative.  Blood pressure (!) 131/100, pulse 98, resp. rate 18, SpO2 96%. There is no height or weight on file to calculate BMI.  Treatment Plan Summary: Patient provided resources to her reported multiple life stressors. Patient offered medication adjustments but declined. Patient is established with a local psychiatrist and has an upcoming appointment. Patient agreed to request therapy services through her psychiatrist, after she reported Dr. Howell Rucks offers therapy services. Patient also plans to follow up with her PCP to obtain resources on additional assistive devices needed and transportation services to enhance her quality of life. Patient also confirmed that she will apply for section 8 housing to get reestablished with the service, as living with her daughter is causing mental strains.  Disposition: No evidence of imminent risk to self or others at present.   Patient does not meet criteria for psychiatric inpatient admission. Supportive therapy provided about ongoing stressors. Discussed crisis plan, support from social network, calling 911, coming to the Emergency Department, and calling Suicide Hotline.  Mcneil Sober, NP 01/02/2023 4:24 PM

## 2023-01-02 NOTE — ED Triage Notes (Signed)
Pt states for the past few days she has felt like she wants hurt herself, denies having a plan currently. No new stressors mentioned.

## 2023-01-02 NOTE — ED Provider Notes (Signed)
[facility] Provider Note    Event Date/Time   First MD Initiated Contact with Patient 01/02/23 1241     (approximate)   History   Suicidal   HPI  GLADIE FOGAL is a 57 y.o. female with a history of anxiety depression not currently on any medications with the recent loss of her sister on the 12th of this past month presents to the ER for suicidal ideation does not have a plan was feeling significant anhedonia and loss of interest in living.  She decided to check herself and voluntary after 3 days of the symptoms.     Physical Exam   Triage Vital Signs: ED Triage Vitals [01/02/23 1146]  Encounter Vitals Group     BP (!) 131/100     Systolic BP Percentile      Diastolic BP Percentile      Pulse Rate 98     Resp 18     Temp      Temp Source Oral     SpO2 96 %     Weight      Height      Head Circumference      Peak Flow      Pain Score 0     Pain Loc      Pain Education      Exclude from Growth Chart     Most recent vital signs: Vitals:   01/02/23 1146  BP: (!) 131/100  Pulse: 98  Resp: 18  SpO2: 96%     Constitutional: Alert  Eyes: Conjunctivae are normal.  Head: Atraumatic. Nose: No congestion/rhinnorhea. Mouth/Throat: Mucous membranes are moist.   Neck: Painless ROM.  Cardiovascular:   Good peripheral circulation. Respiratory: Normal respiratory effort.  No retractions.  Gastrointestinal: Soft and nontender.  Musculoskeletal:  no deformity Neurologic:  MAE spontaneously. No gross focal neurologic deficits are appreciated.  Skin:  Skin is warm, dry and intact. No rash noted. Psychiatric: Mood and affect are depressed. Speech and behavior are normal.    ED Results / Procedures / Treatments   Labs (all labs ordered are listed, but only abnormal results are displayed) Labs Reviewed  COMPREHENSIVE METABOLIC PANEL - Abnormal; Notable for the following components:      Result Value   Glucose, Bld 157 (*)    All other  components within normal limits  SALICYLATE LEVEL - Abnormal; Notable for the following components:   Salicylate Lvl <7.0 (*)    All other components within normal limits  ACETAMINOPHEN LEVEL - Abnormal; Notable for the following components:   Acetaminophen (Tylenol), Serum <10 (*)    All other components within normal limits  URINE DRUG SCREEN, QUALITATIVE ([facility] ONLY) - Abnormal; Notable for the following components:   Tricyclic, Ur Screen POSITIVE (*)    Cannabinoid 50 Ng, Ur [place] POSITIVE (*)    Benzodiazepine, Ur Scrn POSITIVE (*)    All other components within normal limits  ETHANOL  CBC     EKG     RADIOLOGY    PROCEDURES:  Critical Care performed:   Procedures   MEDICATIONS ORDERED IN ED: Medications - No data to display   IMPRESSION / MDM / ASSESSMENT AND PLAN / ED COURSE  I reviewed the triage vital signs and the nursing notes.                              Differential diagnosis includes, but is  not limited to, Psychosis, delirium, medication effect, noncompliance, polysubstance abuse, Si, Hi, depression  Patient presenting to the ER for evaluation of symptoms as described above. .  Laboratory testing was ordered to evaluation for underlying electrolyte derangement or signs of underlying organic pathology to explain today's presentation.  Based on history and physical and laboratory evaluation, it appears that the patient's presentation is 2/2 underlying psychiatric disorder and will require further evaluation and management by inpatient psychiatry. Patient here voluntary.  Disposition pending psychiatric evaluation.   The patient has been placed in psychiatric observation due to the need to provide a safe environment for the patient while obtaining psychiatric consultation and evaluation, as well as ongoing medical and medication management to treat the patient's condition.      FINAL CLINICAL IMPRESSION(S) / ED DIAGNOSES   Final diagnoses:  Depression,  unspecified depression type     Rx / DC Orders   ED Discharge Orders     None        Note:  This document was prepared using Dragon voice recognition software and may include unintentional dictation errors.    Willy Eddy, MD 01/02/23 1254

## 2023-01-02 NOTE — ED Notes (Signed)
Pt is A/Ox4, Ms Shawna [name] declines any SI/HI stated that she has never had any A/V hallucinations.  Discharge instructions reviewed with [name], she verbalized understanding.  All Belongings accounted for and returned to PT.  Pt left self care via private vehicle

## 2023-01-02 NOTE — BH Assessment (Addendum)
Comprehensive Clinical Assessment (CCA) Note  01/02/2023 Shawna [name] [id number]  Chief Complaint:  Chief Complaint  Patient presents with   Suicidal   Visit Diagnosis:    F33.2 Major depressive disorder, Recurrent episode, Severe F12.20 Cannabis use disorder, Severe   Flowsheet Row ED from 01/02/2023 in [facility] Department at [facility] ED from 12/16/2022 in [facility] at [facility]) ED from 11/10/2022 in [facility] at [facility])  C-SSRS RISK CATEGORY High Risk No Risk No Risk      The patient demonstrates the following risk factors for suicide: Chronic risk factors for suicide include: psychiatric disorder of depression, substance use disorder, and previous suicide attempts by overdose . Acute risk factors for suicide include: social withdrawal/isolation and grieving her sister's death . Protective factors for this patient include: positive social support, positive therapeutic relationship, responsibility to others (children, family), coping skills, hope for the future, and life satisfaction. Considering these factors, the overall suicide risk at this point appears to be high. Patient is not appropriate for outpatient follow up.  Disposition: C.Penn, NP recommends patient to be psychiatric cleared and follow-up with [facility].  Disposition discussed with Pharmacist, hospital.  Shawna [name] is a 57 year old female who presents voluntarily to [facility] and unaccompanied.  Pt reports she has a history of depression and has been feeling increasingly depressed for the past two weeks.  Pt reports Passive Suicidal thoughts without a plan. Pt reports she has been thinking about cutting herself.  Pt denies HI, AVH or Paranoia.   Pt reports prior suicide attempt a while ago by overdose (pills).  Pt acknowledged the following symptoms: sadness, isolation, hopelessness, loss of interests, crying, "I have been  having nightmares".  Pt reports her sleep pattern has been ' on and off' during the night.  Pt reports eating once a day.  Pt admits to smoking marijuana daily or "when I can get my hands on it, it helps with sleep and calmness".   Pt denies drinking alcohol or using any other substance.  Pt reports she smokes seven cigarettes daily.  Pt identifies her stressors as with the death of her sister, "my sister died on 08-03-22, I visited my sister's house, which started those feelings".  Pt reports she is currently living with her daughter's boyfriend in his trailer, "I stay in my room, I am lonely, I have a cat that I take care".   Pt reports no support person. Pt reports that she receives disability and currently on the wail list for housing, "I have to check that out again".  Pt denies family history of mental illness; also denies family history of substance used.  Pt denies any history of abuse or trauma.  Pt denies any current legal problems.  Pt denies any guns or weapons in the home.  Pt says she is currently receiving weekly outpatient medication management with Shawna [name], [facility].  Pt reports she is not currently receiving weekly outpatient therapy. Pt reports a forthcoming telehealth appointment, unable to specify time and date. Pt reports she takes medication as prescribed; however refused to take antidepressant.  Pt is dressed in scrubs, alert, oriented x 4 with slow speech and restless motor behavior.  Eye contact is fleeting.  Pt's mood is depressed and affect is anxious.  Thought process relevant.  Pt's insights is lacking and gaps.  Pt judgment is impaired.  There is no  indication Pt is currently responding to internal stimuli or experiencing delusional thought content.  Pt was cooperative throughout assessment.    CCA Screening, Triage and Referral (STR)  Patient Reported Information How did you hear about [place]? Self  What Is the Reason for Your Visit/Call  Today? Passive Suicidal Thoughts without a plan  How Long Has This Been Causing You Problems? 1-6 months  What Do You Feel Would Help You the Most Today? Treatment for Depression or other mood problem; Stress Management; Housing Assistance; Architectural technologist; Social Support   Have You Recently Had Any Thoughts About Hurting Yourself? Yes  Are You Planning to Commit Suicide/Harm Yourself At This time? No   Flowsheet Row ED from 01/02/2023 in [facility] at [facility] ED from 12/16/2022 in [facility] at [facility] ED from 11/10/2022 in [facility] Care at [facility])  C-SSRS RISK CATEGORY High Risk No Risk No Risk       Have you Recently Had Thoughts About Hurting Someone Shawna [name]? No  Are You Planning to Harm Someone at This Time? No  Explanation: n/a   Have You Used Any Alcohol or Drugs in the Past 24 Hours? Yes  What Did You Use and How Much? marijuana   Do You Currently Have a Therapist/Psychiatrist? Yes  Name of Therapist/Psychiatrist: Name of Therapist/Psychiatrist: Dr. Jerelyn [name]   Have You Been Recently Discharged From Any Office Practice or Programs? No  Explanation of Discharge From Practice/Program: n/a     CCA Screening Triage Referral Assessment Type of Contact: Face-to-Face  Telemedicine Service Delivery:   Is this Initial or Reassessment?   Date Telepsych consult ordered in [facility]:    Time Telepsych consult ordered in [facility]:    Location of Assessment: [facility] ED  Provider Location: [facility] ED   Collateral Involvement: No collateral involved.   Does Patient Have a Automotive engineer Guardian? No  Legal Guardian Contact Information: n/a  Copy of Legal Guardianship Form: -- (n/a)  Legal Guardian Notified of Arrival: -- (n/a)  Legal Guardian Notified of Pending Discharge: -- (n/a)  If Minor and Not Living with Parent(s), Who has Custody? n/a  Is CPS involved or ever been  involved? Never  Is APS involved or ever been involved? Never   Patient Determined To Be At Risk for Harm To Self or Others Based on Review of Patient Reported Information or Presenting Complaint? Yes, for Self-Harm  Method: No Plan (prior history overdose (pills))  Availability of Means: No access or NA  Intent: Vague intent or NA  Notification Required: No need or identified person  Additional Information for Danger to Others Potential: -- (n/a)  Additional Comments for Danger to Others Potential: n/a  Are There Guns or Other Weapons in Your Home? No  Types of Guns/Weapons: No guns or weapons in the home  Are These Weapons Safely Secured?                            -- (n/a)  Who Could Verify You Are Able To Have These Secured: n/a  Do You Have any Outstanding Charges, Pending Court Dates, Parole/Probation? No  Contacted To Inform of Risk of Harm To Self or Others: Other: Comment (No need to notifiy)    Does Patient Present under Involuntary Commitment? No    [place] of Residence: [place]   Patient Currently Receiving the Following Services: Medication Management   Determination of Need:  Urgent (48 hours)   Options For Referral: Outpatient Therapy; Medication Management; Intensive Outpatient Therapy     CCA Biopsychosocial Patient Reported Schizophrenia/Schizoaffective Diagnosis in Past: No   Strengths: Pt enjoys crocheting and taking care of her cat   Mental Health Symptoms Depression:   Hopelessness; Worthlessness; Tearfulness; Difficulty Concentrating; Fatigue; Irritability   Duration of Depressive symptoms:  Duration of Depressive Symptoms: Greater than two weeks   Mania:   None   Anxiety:    Fatigue; Restlessness; Worrying   Psychosis:   None   Duration of Psychotic symptoms:    Trauma:   None   Obsessions:   None   Compulsions:   None   Inattention:   None   Hyperactivity/Impulsivity:   None   Oppositional/Defiant  Behaviors:   None   Emotional Irregularity:   Chronic feelings of emptiness; Recurrent suicidal behaviors/gestures/threats   Other Mood/Personality Symptoms:   Depression/Anxious    Mental Status Exam Appearance and self-care  Stature:   Average   Weight:   Average weight   Clothing:   -- (Pt in dressed in scrubs.)   Grooming:   Normal (Pt presents with normal grooming today however, pt's daughter reports that pt lacks taking care of ADLs.)   Cosmetic use:   None   Posture/gait:   Normal   Motor activity:   Slowed; Tremor   Sensorium  Attention:   Confused   Concentration:   Anxiety interferes   Orientation:   Situation; Time; Place; Person   Recall/memory:   Normal   Affect and Mood  Affect:   Anxious; Depressed; Flat   Mood:   Depressed; Anxious   Relating  Eye contact:   Fleeting   Facial expression:   Anxious; Sad; Responsive   Attitude toward examiner:   Cooperative; Guarded   Thought and Language  Speech flow:  Slow   Thought content:   Appropriate to Mood and Circumstances   Preoccupation:   None   Hallucinations:   None   Organization:   Disorganized   Company secretary of Knowledge:   Fair   Intelligence:   Average   Abstraction:   Normal   Judgement:   Impaired   Reality Testing:   Unaware   Insight:   Gaps   Decision Making:   Impulsive   Social Functioning  Social Maturity:   Irresponsible   Social Judgement:   Normal   Stress  Stressors:   Housing; Transitions; Grief/losses   Coping Ability:   Overwhelmed; Exhausted   Skill Deficits:   Activities of daily living; Decision making; Self-care; Responsibility   Supports:   Family     Religion: Religion/Spirituality Are You A Religious Person?: No How Might This Affect Treatment?: NA  Leisure/Recreation: Leisure / Recreation Do You Have Hobbies?: Yes Leisure and Hobbies: crochet and  read  Exercise/Diet: Exercise/Diet Do You Exercise?: No Have You Gained or Lost A Significant Amount of Weight in the Past Six Months?: No Do You Follow a Special Diet?: No Do You Have Any Trouble Sleeping?: Yes Explanation of Sleeping Difficulties: Pt reports on and off sleep pattern during the night   CCA Employment/Education Employment/Work Situation: Employment / Work Situation Employment Situation: On disability Why is Patient on Disability: n/a (n/a) How Long has Patient Been on Disability: n/a (n/a) Patient's Job has Been Impacted by Current Illness: No Has Patient ever Been in the [employer]?: No  Education: Education Is Patient Currently Attending School?: No Last Grade Completed: 8 Did You  Attend College?: No Did You Have An Individualized Education Program (IIEP): No Did You Have Any Difficulty At School?: No Patient's Education Has Been Impacted by Current Illness: No   CCA Family/Childhood History Family and Relationship History: Family history Marital status: Single Does patient have children?: Yes How many children?: 3 How is patient's relationship with their children?: Pt reports she communicates with her daughtesr daily  Childhood History:  Childhood History By whom was/is the patient raised?: Grandparents, Other (Comment) (Patient states she was raised by her grandmother and her aunt. ) Did patient suffer any verbal/emotional/physical/sexual abuse as a child?: No Did patient suffer from severe childhood neglect?: No Has patient ever been sexually abused/assaulted/raped as an adolescent or adult?: No Was the patient ever a victim of a crime or a disaster?: No Witnessed domestic violence?: No Has patient been affected by domestic violence as an adult?: No Description of domestic violence: n/a       CCA Substance Use Alcohol/Drug Use: Alcohol / Drug Use Pain Medications: See MAR Prescriptions: See MAR Over the Counter: See MAR History of alcohol  / drug use?: Yes Longest period of sobriety (when/how long): Pt reports no sobriety (N/A) Negative Consequences of Use: Personal relationships (n/a) Withdrawal Symptoms:  (n/a) Substance #1 Name of Substance 1: Marijuana 1 - Age of First Use: 14 1 - Amount (size/oz): n/a 1 - Frequency: daily or "when I get my hands on it" 1 - Duration: ongoing 1 - Last Use / Amount: 01/01/23 1 - Method of Aquiring: UTA 1- Route of Use: Smoking                       ASAM's:  Six Dimensions of Multidimensional Assessment  Dimension 1:  Acute Intoxication and/or Withdrawal Potential:   Dimension 1:  Description of individual's past and current experiences of substance use and withdrawal: Pt reports she started smoking marijuana at age 15, "it helps me to relax; also, helps to calm me down"  Dimension 2:  Biomedical Conditions and Complications:   Dimension 2:  Description of patient's biomedical conditions and  complications: Pt reports leg pain  Dimension 3:  Emotional, Behavioral, or Cognitive Conditions and Complications:  Dimension 3:  Description of emotional, behavioral, or cognitive conditions and complications: Pt reports Depression Disorder  Dimension 4:  Readiness to Change:  Dimension 4:  Description of Readiness to Change criteria: contemplation  Dimension 5:  Relapse, Continued use, or Continued Problem Potential:  Dimension 5:  Relapse, continued use, or continued problem potential critiera description: continue use  Dimension 6:  Recovery/Living Environment:  Dimension 6:  Recovery/Iiving environment criteria description: Pt reports she is currently living with her daughter's boyfriend in a trailer, "I am lonely"  ASAM Severity Score: ASAM's Severity Rating Score: 13  ASAM Recommended Level of Treatment:     Substance use Disorder (SUD) Substance Use Disorder (SUD)  Checklist Symptoms of Substance Use: Continued use despite having a persistent/recurrent physical/psychological problem  caused/exacerbated by use, Continued use despite persistent or recurrent social, interpersonal problems, caused or exacerbated by use, Presence of craving or strong urge to use, Substance(s) often taken in larger amounts or over longer times than was intended  Recommendations for Services/Supports/Treatments: Recommendations for Services/Supports/Treatments Recommendations For Services/Supports/Treatments: Medication Management, Individual Therapy  Discharge Disposition:    DSM5 Diagnoses: Patient Active Problem List   Diagnosis Date Noted   Depression 01/02/2023   Homelessness 11/09/2022   Adjustment disorder with mixed anxiety and depressed mood 10/14/2022  Suicide attempt (HCC) 08/03/2022   Visual hallucinations 07/30/2017   Colitis 01/22/2017   Dyslipidemia 07/26/2016   Low vitamin B12 level 07/26/2016   Major depressive disorder, 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   Overdose of sedative or hypnotic 07/23/2016   COPD exacerbation (HCC) 07/23/2016   GERD (gastroesophageal reflux disease) 07/23/2016   Cannabis use disorder, moderate, 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   Tobacco use disorder 07/23/2016   Sedative, hypnotic or anxiolytic use disorder, severe, 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

## 2023-01-04 NOTE — Group Note (Deleted)

## 2023-01-16 ENCOUNTER — Emergency Department (HOSPITAL_COMMUNITY)
Admission: EM | Admit: 2023-01-16 | Discharge: 2023-01-17 | Disposition: A | Discharge status: Home / Self Care | Payer: 59 | Attending: Emergency Medicine | Admitting: Emergency Medicine

## 2023-01-16 ENCOUNTER — Encounter (HOSPITAL_COMMUNITY): Payer: Self-pay

## 2023-01-16 ENCOUNTER — Other Ambulatory Visit: Payer: Self-pay

## 2023-01-16 ENCOUNTER — Emergency Department (HOSPITAL_COMMUNITY): Payer: 59

## 2023-01-16 DIAGNOSIS — R112 Nausea with vomiting, unspecified: Secondary | ICD-10-CM | POA: Diagnosis present

## 2023-01-16 DIAGNOSIS — R1084 Generalized abdominal pain: Secondary | ICD-10-CM | POA: Insufficient documentation

## 2023-01-16 DIAGNOSIS — J449 Chronic obstructive pulmonary disease, unspecified: Secondary | ICD-10-CM | POA: Diagnosis not present

## 2023-01-16 LAB — COMPREHENSIVE METABOLIC PANEL
ALT: 16 U/L (ref 0–44)
AST: 19 U/L (ref 15–41)
Albumin: 4 g/dL (ref 3.5–5.0)
Alkaline Phosphatase: 95 U/L (ref 38–126)
Anion gap: 10 (ref 5–15)
BUN: 9 mg/dL (ref 6–20)
CO2: 23 mmol/L (ref 22–32)
Calcium: 9.2 mg/dL (ref 8.9–10.3)
Chloride: 105 mmol/L (ref 98–111)
Creatinine, Ser: 0.95 mg/dL (ref 0.44–1.00)
GFR, Estimated: >60 mL/min (ref >60)
Glucose, Bld: 177 mg/dL — ABNORMAL HIGH (ref 70–99)
Potassium: 3.7 mmol/L (ref 3.5–5.1)
Sodium: 138 mmol/L (ref 135–145)
Total Bilirubin: 1 mg/dL (ref 0.3–1.2)
Total Protein: 7.2 g/dL (ref 6.5–8.1)

## 2023-01-16 LAB — URINALYSIS, ROUTINE W REFLEX MICROSCOPIC
Bilirubin Urine: NEGATIVE
Glucose, UA: NEGATIVE mg/dL
Hgb urine dipstick: NEGATIVE
Ketones, ur: 5 mg/dL — AB
Leukocytes,Ua: NEGATIVE
Nitrite: NEGATIVE
Protein, ur: 30 mg/dL — AB
Specific Gravity, Urine: 1.02 (ref 1.005–1.030)
pH: 6 (ref 5.0–8.0)

## 2023-01-16 LAB — CBC
HCT: 42.7 % (ref 36.0–46.0)
Hemoglobin: 14.4 g/dL (ref 12.0–15.0)
MCH: 29.9 pg (ref 26.0–34.0)
MCHC: 33.7 g/dL (ref 30.0–36.0)
MCV: 88.8 fL (ref 80.0–100.0)
Platelets: 391 10*3/uL (ref 150–400)
RBC: 4.81 MIL/uL (ref 3.87–5.11)
RDW: 12.7 % (ref 11.5–15.5)
WBC: 12.1 10*3/uL — ABNORMAL HIGH (ref 4.0–10.5)
nRBC: 0 % (ref 0.0–0.2)

## 2023-01-16 LAB — LIPASE, BLOOD: Lipase: 22 U/L (ref 11–51)

## 2023-01-16 MED ORDER — DICYCLOMINE HCL 20 MG PO TABS: 20.0000 mg | ORAL_TABLET | Freq: Two times a day (BID) | ORAL | 0 refills | Status: DC

## 2023-01-16 MED ORDER — IOHEXOL 350 MG/ML SOLN
75.0000 mL | Freq: Once | INTRAVENOUS | Status: AC | PRN
  Administered 2023-01-16: 75 mL via INTRAVENOUS

## 2023-01-16 MED ORDER — KETOROLAC TROMETHAMINE 30 MG/ML IJ SOLN
30.0000 mg | Freq: Once | INTRAMUSCULAR | Status: AC
  Administered 2023-01-16: 30 mg via INTRAVENOUS
  Filled 2023-01-16: qty 1

## 2023-01-16 MED ORDER — ONDANSETRON HCL 4 MG/2ML IJ SOLN
4.0000 mg | Freq: Once | INTRAMUSCULAR | Status: AC
  Administered 2023-01-16: 4 mg via INTRAVENOUS
  Filled 2023-01-16: qty 2

## 2023-01-16 MED ORDER — PANTOPRAZOLE SODIUM 20 MG PO TBEC: 20.0000 mg | DELAYED_RELEASE_TABLET | Freq: Every day | ORAL | 0 refills | Status: DC

## 2023-01-16 MED ORDER — ONDANSETRON 8 MG PO TBDP: 8.0000 mg | ORAL_TABLET | Freq: Three times a day (TID) | ORAL | 0 refills | Status: DC | PRN

## 2023-01-16 MED ORDER — LACTATED RINGERS IV BOLUS
1000.0000 mL | Freq: Once | INTRAVENOUS | Status: AC
  Administered 2023-01-16: 1000 mL via INTRAVENOUS

## 2023-01-16 NOTE — Discharge Instructions (Addendum)
Take the medications as prescribed to help with your stomach pain and nausea.  You can also take over-the-counter Tylenol or ibuprofen.  Follow-up with your doctor to be rechecked

## 2023-01-16 NOTE — ED Notes (Signed)
I was on my way to room 5 and started toward room 4 when I saw the Pt just sitting in the floor. She wasn't saying anything.

## 2023-01-16 NOTE — ED Notes (Signed)
This RN heard the patient calling out from her triage room and when this RN went into her room she was found sitting on the floor with her legs crossed. She states she fell down and now reports back pain. She states she was attempting to get up to stand and states she tripped over the foot stool in the triage chair. The foot stool was not out. She denies head injury.   EDP informed and charge informed.

## 2023-01-16 NOTE — ED Provider Notes (Signed)
[place] EMERGENCY DEPARTMENT AT [facility] Provider Note   CSN: [id number] Arrival date & time: 01/16/23  1521     History  Chief Complaint  Patient presents with  . Emesis    Shawna [name] is a 57 y.o. female.   Emesis    Patient has a history of sciatica insomnia kidney stones pneumonia reflux COPD.  Patient presents ED with complaints of nausea vomiting abdominal pain that started several days ago.  Patient states she has not been able to eat or drink anything other than small sips of ginger ale.  She denies any diarrhea or dysuria.  Patient states the symptoms were getting worse today so she came to the ED for evaluation.  Patient continues to have pain in her abdomen.  Home Medications Prior to Admission medications   Medication Sig Start Date End Date Taking? Authorizing Provider  dicyclomine (BENTYL) 20 MG tablet Take 1 tablet (20 mg total) by mouth 2 (two) times daily. 01/16/23  Yes Linwood Dibbles, MD  ondansetron (ZOFRAN-ODT) 8 MG disintegrating tablet Take 1 tablet (8 mg total) by mouth every 8 (eight) hours as needed for nausea or vomiting. 01/16/23  Yes Linwood Dibbles, MD  pantoprazole (PROTONIX) 20 MG tablet Take 1 tablet (20 mg total) by mouth daily. 01/16/23  Yes Linwood Dibbles, MD  acetaminophen (TYLENOL) 500 MG tablet Take 500 mg by mouth every 6 (six) hours as needed. Patient not taking: Reported on 12/26/2021    [provider]  albuterol (PROVENTIL HFA;VENTOLIN HFA) 108 (90 Base) MCG/ACT inhaler Inhale 2 puffs into the lungs every 6 (six) hours as needed for wheezing or shortness of breath. 01/16/16   Jennye Moccasin, MD  aluminum-magnesium hydroxide-simethicone (MAALOX) 200-200-20 MG/5ML SUSP Take 30 mLs by mouth 4 (four) times daily -  before meals and at bedtime. Patient not taking: Reported on 08/03/2022 02/28/19   Sharman Cheek, MD  atorvastatin (LIPITOR) 10 MG tablet Take 10 mg by mouth at bedtime. 08/12/22   [provider]  buPROPion  (WELLBUTRIN SR) 150 MG 12 hr tablet Take 150 mg by mouth daily. 11/03/22   [provider]  clonazePAM (KLONOPIN) 0.5 MG tablet Take 0.5 mg by mouth 2 (two) times daily as needed for anxiety.    [provider]  clonazePAM (KLONOPIN) 1 MG tablet Take 0.5 mg by mouth 2 (two) times daily as needed for anxiety. 10/15/22   [provider]  dexlansoprazole (DEXILANT) 60 MG capsule Take 1 capsule by mouth daily. Patient not taking: Reported on 12/26/2021    [provider]  DULoxetine 40 MG CPEP Take 40 mg by mouth daily. Patient not taking: Reported on 12/26/2021 07/27/16   Pucilowska, Braulio Conte B, MD  famotidine (PEPCID) 20 MG tablet Take 1 tablet (20 mg total) by mouth 2 (two) times daily. 02/28/19   Sharman Cheek, MD  fexofenadine (ALLEGRA) 180 MG tablet Take 180 mg by mouth daily.    [provider]  FLUoxetine (PROZAC) 20 MG capsule Take 20 mg by mouth daily. 07/16/22   [provider]  FLUoxetine (PROZAC) 40 MG capsule Take 40 mg by mouth daily. Patient not taking: Reported on 11/04/2022    [provider]  fluticasone (FLONASE) 50 MCG/ACT nasal spray Place 2 sprays into both nostrils daily.     [provider]  hydrOXYzine (ATARAX) 25 MG tablet Take 25 mg by mouth 4 (four) times daily. 07/30/22   [provider]  hydrOXYzine (ATARAX/VISTARIL) 50 MG tablet Take 50  mg by mouth every 6 (six) hours as needed.     [provider]  hydrOXYzine (VISTARIL) 50 MG capsule Take 100 mg by mouth every 8 (eight) hours as needed. 08/12/22   [provider]  lidocaine (XYLOCAINE) 2 % solution Use as directed 15 mLs in the mouth or throat as needed for mouth pain. Patient not taking: Reported on 08/03/2022 04/13/19   Chesley Noon, MD  lidocaine (XYLOCAINE) 5 % ointment Apply 1 Application topically daily as needed. 08/17/22   [provider]  MAGNESIUM PO Take 1 tablet by mouth daily.    [provider]   metoCLOPramide (REGLAN) 10 MG tablet Take 1 tablet (10 mg total) by mouth every 6 (six) hours as needed. Patient not taking: Reported on 12/26/2021 02/28/19   Sharman Cheek, MD  metoCLOPramide (REGLAN) 10 MG tablet Take 1 tablet (10 mg total) by mouth every 8 (eight) hours as needed for refractory nausea / vomiting. Patient not taking: Reported on 12/26/2021 04/18/19   Felicie Morn, NP  ondansetron (ZOFRAN) 4 MG tablet Take 1 tablet (4 mg total) by mouth every 8 (eight) hours as needed for nausea or vomiting. 10/13/22   Virgina Norfolk, DO  predniSONE (DELTASONE) 20 MG tablet Take 2 tablets (40 mg total) by mouth daily with breakfast. 11/04/22   Bing Neighbors, NP  predniSONE (DELTASONE) 20 MG tablet Take 2 tablets (40 mg total) by mouth daily. 11/09/22   White, Elita Boone, NP  rOPINIRole (REQUIP) 2 MG tablet Take 2 mg by mouth 2 (two) times daily. 08/17/22   [provider]  sucralfate (CARAFATE) 1 g tablet Take 1 g by mouth 4 (four) times daily. 08/17/22   [provider]  tiZANidine (ZANAFLEX) 4 MG tablet Take 1 tablet (4 mg total) by mouth every 8 (eight) hours as needed for muscle spasms. 11/10/22   Bing Neighbors, NP  zolpidem (AMBIEN) 10 MG tablet Take 10 mg by mouth at bedtime as needed for sleep. Patient not taking: Reported on 11/04/2022    [provider]      Allergies    Haloperidol, Hydrocodone-acetaminophen, Morphine, Morphine and codeine, Pravastatin, Pravastatin sodium, Promethazine hcl, Fentanyl, and Propofol    Review of Systems   Review of Systems  Gastrointestinal:  Positive for vomiting.    Physical Exam Updated Vital Signs BP 123/75 (BP Location: Left Arm)   Pulse (!) 124   Temp 98.4 F (36.9 C) (Oral)   Resp 18   Ht 1.676 m (5\' 6" )   Wt 77.1 kg   SpO2 96%   BMI 27.44 kg/m  Physical Exam Vitals and nursing note reviewed.  Constitutional:      General: She is not in acute distress.    Appearance: She is well-developed.  HENT:      Head: Normocephalic and atraumatic.     Right Ear: External ear normal.     Left Ear: External ear normal.  Eyes:     General: No scleral icterus.       Right eye: No discharge.        Left eye: No discharge.     Conjunctiva/sclera: Conjunctivae normal.  Neck:     Trachea: No tracheal deviation.  Cardiovascular:     Rate and Rhythm: Normal rate and regular rhythm.  Pulmonary:     Effort: Pulmonary effort is normal. No respiratory distress.     Breath sounds: Normal breath sounds. No stridor. No wheezing or rales.  Abdominal:  General: Bowel sounds are normal. There is no distension.     Palpations: Abdomen is soft.     Tenderness: There is generalized abdominal tenderness. There is no guarding or rebound.  Musculoskeletal:        General: No tenderness or deformity.     Cervical back: Neck supple.  Skin:    General: Skin is warm and dry.     Findings: No rash.  Neurological:     General: No focal deficit present.     Mental Status: She is alert.     Cranial Nerves: No cranial nerve deficit, dysarthria or facial asymmetry.     Sensory: No sensory deficit.     Motor: No abnormal muscle tone or seizure activity.     Coordination: Coordination normal.  Psychiatric:        Mood and Affect: Mood normal.     ED Results / Procedures / Treatments   Labs (all labs ordered are listed, but only abnormal results are displayed) Labs Reviewed  COMPREHENSIVE METABOLIC PANEL - Abnormal; Notable for the following components:      Result Value   Glucose, Bld 177 (*)    All other components within normal limits  CBC - Abnormal; Notable for the following components:   WBC 12.1 (*)    All other components within normal limits  URINALYSIS, ROUTINE W REFLEX MICROSCOPIC - Abnormal; Notable for the following components:   Color, Urine AMBER (*)    APPearance HAZY (*)    Ketones, ur 5 (*)    Protein, ur 30 (*)    Bacteria, UA RARE (*)    All other components within normal limits   LIPASE, BLOOD    EKG None  Radiology CT ABDOMEN PELVIS W CONTRAST  Result Date: 01/16/2023 CLINICAL DATA:  Acute abdominal pain EXAM: CT ABDOMEN AND PELVIS WITH CONTRAST TECHNIQUE: Multidetector CT imaging of the abdomen and pelvis was performed using the standard protocol following bolus administration of intravenous contrast. RADIATION DOSE REDUCTION: This exam was performed according to the departmental dose-optimization program which includes automated exposure control, adjustment of the mA and/or kV according to patient size and/or use of iterative reconstruction technique. CONTRAST:  75mL OMNIPAQUE IOHEXOL 350 MG/ML SOLN COMPARISON:  CT abdomen and pelvis 10/04/2022 FINDINGS: Lower chest: Emphysematous changes and scarring noted in the left lung base. Hepatobiliary: No focal liver abnormality is seen. No gallstones, gallbladder wall thickening, or biliary dilatation. Pancreas: Unremarkable. No pancreatic ductal dilatation or surrounding inflammatory changes. Spleen: Normal in size without focal abnormality. Adrenals/Urinary Tract: There are nonobstructing calculi in the inferior pole the left kidney measuring 5 mm. Subcentimeter rounded hypodensities in both kidneys are too small to characterize, most likely cysts. There is no hydronephrosis or perinephric fluid collection. The adrenal glands and bladder are within normal limits. Stomach/Bowel: Small hiatal hernia is present. Stomach is within normal limits. Appendix appears normal. No evidence of bowel wall thickening, distention, or inflammatory changes. Vascular/Lymphatic: Aortic atherosclerosis. No enlarged abdominal or pelvic lymph nodes. Reproductive: Status post hysterectomy. No adnexal masses. Other: No abdominal wall hernia or abnormality. No abdominopelvic ascites. Musculoskeletal: There are degenerative changes at L5-S1. There stable mild compression deformity of the inferior endplate of L3. 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 IMPRESSION: 1. No acute localizing process in the abdomen or pelvis. 2. Nonobstructing left renal calculus. 3. Small hiatal hernia. 4. Subcentimeter Bosniak II renal cyst, too small to characterize. No follow-up  imaging is recommended. JACR 2018 Feb; 264-273, Management of the Incidental Renal Mass on CT, [employer] 2021; 814-848, Bosniak Classification of Cystic Renal Masses, Version 2019. Aortic Atherosclerosis (ICD10-I70.0). Electronically Signed   By: Darliss Cheney M.D.   On: 01/16/2023 23:28    Procedures Procedures    Medications Ordered in ED Medications  lactated ringers bolus 1,000 mL (1,000 mLs Intravenous New Bag/Given 01/16/23 2217)  ondansetron (ZOFRAN) injection 4 mg (4 mg Intravenous Given 01/16/23 2218)  ketorolac (TORADOL) 30 MG/ML injection 30 mg (30 mg Intravenous Given 01/16/23 2218)  iohexol (OMNIPAQUE) 350 MG/ML injection 75 mL (75 mLs Intravenous Contrast Given 01/16/23 2302)    ED Course/ Medical Decision Making/ A&P Clinical Course as of 01/16/23 2347  Sat Jan 16, 2023  2333 CT scan without acute findings. [JK]  2335 Labs reviewed no significant abnormalities. [JK]  2345 Repeat vitals taken.  Heart rate 70 [JK]    Clinical Course User Index [JK] Linwood Dibbles, MD                                 Medical Decision Making Differential diagnosis includes but not limited to hepatitis pancreatitis cholecystitis bowel obstruction colitis diverticulitis  Problems Addressed: Generalized abdominal pain: acute illness or injury that poses a threat to life or bodily functions Nausea and vomiting, unspecified vomiting type: acute illness or injury  Amount and/or Complexity of Data Reviewed Labs: ordered. Radiology: ordered.  Risk Prescription drug management.   ED workup reassuring.  Labs do not suggest hepatitis or pancreatitis.  Urinalysis not suggestive of infection.  With her white blood cell  count and abdominal tenderness CT scan was performed and no signs of any acute abnormality.  Patient was treated with IV fluids antiemetics and pain medications.  No vomiting in the ED.  Will discharge home with symptomatic treatment        Final Clinical Impression(s) / ED Diagnoses Final diagnoses:  Nausea and vomiting, unspecified vomiting type  Generalized abdominal pain    Rx / DC Orders ED Discharge Orders          Ordered    dicyclomine (BENTYL) 20 MG tablet  2 times daily        01/16/23 2337    ondansetron (ZOFRAN-ODT) 8 MG disintegrating tablet  Every 8 hours PRN        01/16/23 2337    pantoprazole (PROTONIX) 20 MG tablet  Daily        01/16/23 2337              Linwood Dibbles, MD 01/16/23 2339

## 2023-01-16 NOTE — ED Triage Notes (Signed)
Pt reports abd pain, nausea and vomiting x 4 days. Emesis x 2 today. Denies diarrhea.

## 2023-03-26 ENCOUNTER — Emergency Department (HOSPITAL_COMMUNITY)
Admission: EM | Admit: 2023-03-26 | Discharge: 2023-03-26 | Disposition: A | Discharge status: Home / Self Care | Payer: 59 | Attending: Emergency Medicine | Admitting: Emergency Medicine

## 2023-03-26 ENCOUNTER — Other Ambulatory Visit: Payer: Self-pay

## 2023-03-26 ENCOUNTER — Encounter (HOSPITAL_COMMUNITY): Payer: Self-pay | Admitting: *Deleted

## 2023-03-26 DIAGNOSIS — R252 Cramp and spasm: Secondary | ICD-10-CM | POA: Diagnosis present

## 2023-03-26 DIAGNOSIS — M62838 Other muscle spasm: Secondary | ICD-10-CM | POA: Diagnosis not present

## 2023-03-26 LAB — URINALYSIS, ROUTINE W REFLEX MICROSCOPIC
Bilirubin Urine: NEGATIVE
Glucose, UA: NEGATIVE mg/dL
Hgb urine dipstick: NEGATIVE
Ketones, ur: NEGATIVE mg/dL
Leukocytes,Ua: NEGATIVE
Nitrite: NEGATIVE
Protein, ur: NEGATIVE mg/dL
Specific Gravity, Urine: 1.013 (ref 1.005–1.030)
pH: 6 (ref 5.0–8.0)

## 2023-03-26 LAB — COMPREHENSIVE METABOLIC PANEL
ALT: 28 U/L (ref 0–44)
AST: 32 U/L (ref 15–41)
Albumin: 3.7 g/dL (ref 3.5–5.0)
Alkaline Phosphatase: 83 U/L (ref 38–126)
Anion gap: 12 (ref 5–15)
BUN: 12 mg/dL (ref 6–20)
CO2: 19 mmol/L — ABNORMAL LOW (ref 22–32)
Calcium: 9.2 mg/dL (ref 8.9–10.3)
Chloride: 104 mmol/L (ref 98–111)
Creatinine, Ser: 1.16 mg/dL — ABNORMAL HIGH (ref 0.44–1.00)
GFR, Estimated: 55 mL/min — ABNORMAL LOW (ref >60)
Glucose, Bld: 307 mg/dL — ABNORMAL HIGH (ref 70–99)
Potassium: 3.6 mmol/L (ref 3.5–5.1)
Sodium: 135 mmol/L (ref 135–145)
Total Bilirubin: 0.8 mg/dL (ref <1.2)
Total Protein: 6.5 g/dL (ref 6.5–8.1)

## 2023-03-26 LAB — CBC WITH DIFFERENTIAL/PLATELET
Abs Immature Granulocytes: 0.03 10*3/uL (ref 0.00–0.07)
Basophils Absolute: 0.1 10*3/uL (ref 0.0–0.1)
Basophils Relative: 1 %
Eosinophils Absolute: 0.3 10*3/uL (ref 0.0–0.5)
Eosinophils Relative: 3 %
HCT: 36.9 % (ref 36.0–46.0)
Hemoglobin: 12.2 g/dL (ref 12.0–15.0)
Immature Granulocytes: 0 %
Lymphocytes Relative: 28 %
Lymphs Abs: 3.1 10*3/uL (ref 0.7–4.0)
MCH: 29.8 pg (ref 26.0–34.0)
MCHC: 33.1 g/dL (ref 30.0–36.0)
MCV: 90.2 fL (ref 80.0–100.0)
Monocytes Absolute: 0.7 10*3/uL (ref 0.1–1.0)
Monocytes Relative: 6 %
Neutro Abs: 7.1 10*3/uL (ref 1.7–7.7)
Neutrophils Relative %: 62 %
Platelets: 294 10*3/uL (ref 150–400)
RBC: 4.09 MIL/uL (ref 3.87–5.11)
RDW: 13.5 % (ref 11.5–15.5)
WBC: 11.2 10*3/uL — ABNORMAL HIGH (ref 4.0–10.5)
nRBC: 0 % (ref 0.0–0.2)

## 2023-03-26 MED ORDER — METHOCARBAMOL 500 MG PO TABS: 500.0000 mg | ORAL_TABLET | Freq: Three times a day (TID) | ORAL | 0 refills | Status: DC | PRN

## 2023-03-26 MED ORDER — ACETAMINOPHEN 500 MG PO TABS
1000.0000 mg | ORAL_TABLET | Freq: Once | ORAL | Status: AC
  Administered 2023-03-26: 1000 mg via ORAL
  Filled 2023-03-26: qty 2

## 2023-03-26 NOTE — ED Provider Notes (Signed)
[place] EMERGENCY DEPARTMENT AT [facility] Provider Note   CSN: [id number] Arrival date & time: 03/26/23  0308     History  Chief Complaint  Patient presents with   Spasms    Shawna [name] is a 57 y.o. female.  Patient presents to emergency department stating that she is having full body spasms.  Patient reports that she has a history of chronic back problems, got disability because of her back problems 10 years ago.  She has intermittent flares of sciatica and currently is having a flare.  She reports that she had to drive to [facility] and back and has been having increased pain from her sciatica.  While driving she started having spasms of her hands and then her whole body.       Home Medications Prior to Admission medications   Medication Sig Start Date End Date Taking? Authorizing Provider  methocarbamol (ROBAXIN) 500 MG tablet Take 1 tablet (500 mg total) by mouth every 8 (eight) hours as needed for muscle spasms. 03/26/23  Yes Deval Mroczka, Canary Brim, MD  acetaminophen (TYLENOL) 500 MG tablet Take 500 mg by mouth every 6 (six) hours as needed. Patient not taking: Reported on 12/26/2021    [provider]  albuterol (PROVENTIL HFA;VENTOLIN HFA) 108 (90 Base) MCG/ACT inhaler Inhale 2 puffs into the lungs every 6 (six) hours as needed for wheezing or shortness of breath. 01/16/16   Jennye Moccasin, MD  aluminum-magnesium hydroxide-simethicone (MAALOX) 200-200-20 MG/5ML SUSP Take 30 mLs by mouth 4 (four) times daily -  before meals and at bedtime. Patient not taking: Reported on 08/03/2022 02/28/19   Sharman Cheek, MD  atorvastatin (LIPITOR) 10 MG tablet Take 10 mg by mouth at bedtime. 08/12/22   [provider]  buPROPion (WELLBUTRIN SR) 150 MG 12 hr tablet Take 150 mg by mouth daily. 11/03/22   [provider]  clonazePAM (KLONOPIN) 0.5 MG tablet Take 0.5 mg by mouth 2 (two) times daily as needed for anxiety.    [provider]   clonazePAM (KLONOPIN) 1 MG tablet Take 0.5 mg by mouth 2 (two) times daily as needed for anxiety. 10/15/22   [provider]  dexlansoprazole (DEXILANT) 60 MG capsule Take 1 capsule by mouth daily. Patient not taking: Reported on 12/26/2021    [provider]  dicyclomine (BENTYL) 20 MG tablet Take 1 tablet (20 mg total) by mouth 2 (two) times daily. 01/16/23   Linwood Dibbles, MD  DULoxetine 40 MG CPEP Take 40 mg by mouth daily. Patient not taking: Reported on 12/26/2021 07/27/16   Pucilowska, Braulio Conte B, MD  famotidine (PEPCID) 20 MG tablet Take 1 tablet (20 mg total) by mouth 2 (two) times daily. 02/28/19   Sharman Cheek, MD  fexofenadine (ALLEGRA) 180 MG tablet Take 180 mg by mouth daily.    [provider]  FLUoxetine (PROZAC) 20 MG capsule Take 20 mg by mouth daily. 07/16/22   [provider]  FLUoxetine (PROZAC) 40 MG capsule Take 40 mg by mouth daily. Patient not taking: Reported on 11/04/2022    [provider]  fluticasone (FLONASE) 50 MCG/ACT nasal spray Place 2 sprays into both nostrils daily.     [provider]  hydrOXYzine (ATARAX) 25 MG tablet Take 25 mg by mouth 4 (four) times daily. 07/30/22   [provider]  hydrOXYzine (ATARAX/VISTARIL) 50 MG tablet Take 50 mg by mouth every 6 (six) hours as needed.     [provider]  hydrOXYzine (VISTARIL)  50 MG capsule Take 100 mg by mouth every 8 (eight) hours as needed. 08/12/22   [provider]  lidocaine (XYLOCAINE) 2 % solution Use as directed 15 mLs in the mouth or throat as needed for mouth pain. Patient not taking: Reported on 08/03/2022 04/13/19   Chesley Noon, MD  lidocaine (XYLOCAINE) 5 % ointment Apply 1 Application topically daily as needed. 08/17/22   [provider]  MAGNESIUM PO Take 1 tablet by mouth daily.    [provider]  metoCLOPramide (REGLAN) 10 MG tablet Take 1 tablet (10 mg total) by mouth every 6 (six) hours as needed. Patient  not taking: Reported on 12/26/2021 02/28/19   Sharman Cheek, MD  metoCLOPramide (REGLAN) 10 MG tablet Take 1 tablet (10 mg total) by mouth every 8 (eight) hours as needed for refractory nausea / vomiting. Patient not taking: Reported on 12/26/2021 04/18/19   Felicie Morn, NP  ondansetron (ZOFRAN) 4 MG tablet Take 1 tablet (4 mg total) by mouth every 8 (eight) hours as needed for nausea or vomiting. 10/13/22   Curatolo, Adam, DO  ondansetron (ZOFRAN-ODT) 8 MG disintegrating tablet Take 1 tablet (8 mg total) by mouth every 8 (eight) hours as needed for nausea or vomiting. 01/16/23   Linwood Dibbles, MD  pantoprazole (PROTONIX) 20 MG tablet Take 1 tablet (20 mg total) by mouth daily. 01/16/23   Linwood Dibbles, MD  predniSONE (DELTASONE) 20 MG tablet Take 2 tablets (40 mg total) by mouth daily with breakfast. 11/04/22   Bing Neighbors, NP  predniSONE (DELTASONE) 20 MG tablet Take 2 tablets (40 mg total) by mouth daily. 11/09/22   White, Elita Boone, NP  rOPINIRole (REQUIP) 2 MG tablet Take 2 mg by mouth 2 (two) times daily. 08/17/22   [provider]  sucralfate (CARAFATE) 1 g tablet Take 1 g by mouth 4 (four) times daily. 08/17/22   [provider]  zolpidem (AMBIEN) 10 MG tablet Take 10 mg by mouth at bedtime as needed for sleep. Patient not taking: Reported on 11/04/2022    [provider]      Allergies    Haloperidol, Hydrocodone-acetaminophen, Morphine, Morphine and codeine, Pravastatin, Pravastatin sodium, Promethazine hcl, Fentanyl, and Propofol    Review of Systems   Review of Systems  Physical Exam Updated Vital Signs BP 139/89 (BP Location: Left Arm)   Pulse (!) 113   Temp 97.8 F (36.6 C) (Oral)   Resp 20   Ht 5\' 6"  (1.676 m)   Wt 75.8 kg   SpO2 97%   BMI 26.95 kg/m  Physical Exam Vitals and nursing note reviewed.  Constitutional:      General: She is not in acute distress.    Appearance: She is well-developed.  HENT:     Head: Normocephalic and atraumatic.      Mouth/Throat:     Mouth: Mucous membranes are moist.  Eyes:     General: Vision grossly intact. Gaze aligned appropriately.     Extraocular Movements: Extraocular movements intact.     Conjunctiva/sclera: Conjunctivae normal.  Cardiovascular:     Rate and Rhythm: Normal rate and regular rhythm.     Pulses: Normal pulses.     Heart sounds: Normal heart sounds, S1 normal and S2 normal. No murmur heard.    No friction rub. No gallop.  Pulmonary:     Effort: Pulmonary effort is normal. No respiratory distress.     Breath sounds: Normal breath sounds.  Abdominal:     General: Bowel  sounds are normal.     Palpations: Abdomen is soft.     Tenderness: There is no abdominal tenderness. There is no guarding or rebound.     Hernia: No hernia is present.  Musculoskeletal:        General: No swelling.     Cervical back: Full passive range of motion without pain, normal range of motion and neck supple. No spinous process tenderness or muscular tenderness. Normal range of motion.     Right lower leg: No edema.     Left lower leg: No edema.  Skin:    General: Skin is warm and dry.     Capillary Refill: Capillary refill takes less than 2 seconds.     Findings: No ecchymosis, erythema, rash or wound.  Neurological:     General: No focal deficit present.     Mental Status: She is alert and oriented to person, place, and time.     GCS: GCS eye subscore is 4. GCS verbal subscore is 5. GCS motor subscore is 6.     Cranial Nerves: Cranial nerves 2-12 are intact.     Sensory: Sensation is intact.     Motor: Motor function is intact.     Coordination: Coordination is intact.  Psychiatric:        Attention and Perception: Attention normal.        Mood and Affect: Mood normal.        Speech: Speech normal.        Behavior: Behavior normal.     ED Results / Procedures / Treatments   Labs (all labs ordered are listed, but only abnormal results are displayed) Labs Reviewed  CBC WITH  DIFFERENTIAL/PLATELET - Abnormal; Notable for the following components:      Result Value   WBC 11.2 (*)    All other components within normal limits  COMPREHENSIVE METABOLIC PANEL - Abnormal; Notable for the following components:   CO2 19 (*)    Glucose, Bld 307 (*)    Creatinine, Ser 1.16 (*)    GFR, Estimated 55 (*)    All other components within normal limits  URINALYSIS, ROUTINE W REFLEX MICROSCOPIC    EKG None  Radiology No results found.  Procedures Procedures    Medications Ordered in ED Medications - No data to display  ED Course/ Medical Decision Making/ A&P                                 Medical Decision Making Amount and/or Complexity of Data Reviewed Labs: ordered.   I suspect the majority of patient's presentation is secondary to chronic pain.  She reports a history of chronic back pain, currently does not have any prescribed medications.  She reports that her sciatica has worsened.  She describes tensing up of her muscles while driving, likely secondary to the pain.  She has a normal neurologic exam.  No red flags for her chronic back pain, no change in bowel or bladder function, no saddle anesthesia, no foot drop.  No recent injury.  Patient's electrolytes are unremarkable, no sign of potassium or calcium abnormality to explain her spasms.        Final Clinical Impression(s) / ED Diagnoses Final diagnoses:  Muscle spasm    Rx / DC Orders ED Discharge Orders          Ordered    methocarbamol (ROBAXIN) 500 MG tablet  Every 8 hours  PRN        03/26/23 0539              Gilda Crease, MD 03/26/23 [phone]

## 2023-03-26 NOTE — ED Triage Notes (Signed)
Patient presents to ED via POV c/o muscle spasms over entire body onset 2 days ago, c/o vomiting today. Ambulatory without difficulty

## 2023-03-27 ENCOUNTER — Other Ambulatory Visit: Payer: Self-pay

## 2023-03-27 ENCOUNTER — Encounter (HOSPITAL_COMMUNITY): Payer: Self-pay

## 2023-03-27 ENCOUNTER — Emergency Department (HOSPITAL_COMMUNITY)
Admission: EM | Admit: 2023-03-27 | Discharge: 2023-03-28 | Disposition: A | Discharge status: Home / Self Care | Payer: 59 | Attending: Emergency Medicine | Admitting: Emergency Medicine

## 2023-03-27 DIAGNOSIS — M79604 Pain in right leg: Secondary | ICD-10-CM | POA: Diagnosis present

## 2023-03-27 DIAGNOSIS — J449 Chronic obstructive pulmonary disease, unspecified: Secondary | ICD-10-CM | POA: Insufficient documentation

## 2023-03-27 DIAGNOSIS — J45909 Unspecified asthma, uncomplicated: Secondary | ICD-10-CM | POA: Insufficient documentation

## 2023-03-27 DIAGNOSIS — M5431 Sciatica, right side: Secondary | ICD-10-CM | POA: Insufficient documentation

## 2023-03-27 DIAGNOSIS — Z7951 Long term (current) use of inhaled steroids: Secondary | ICD-10-CM | POA: Insufficient documentation

## 2023-03-27 MED ORDER — ONDANSETRON 4 MG PO TBDP
4.0000 mg | ORAL_TABLET | Freq: Once | ORAL | Status: AC
  Administered 2023-03-27: 4 mg via ORAL
  Filled 2023-03-27: qty 1

## 2023-03-27 MED ORDER — OXYCODONE-ACETAMINOPHEN 5-325 MG PO TABS
1.0000 | ORAL_TABLET | Freq: Once | ORAL | Status: AC
  Administered 2023-03-27: 1 via ORAL
  Filled 2023-03-27: qty 1

## 2023-03-27 MED ORDER — KETOROLAC TROMETHAMINE 30 MG/ML IJ SOLN
30.0000 mg | Freq: Once | INTRAMUSCULAR | Status: AC
  Administered 2023-03-27: 30 mg via INTRAMUSCULAR
  Filled 2023-03-27: qty 1

## 2023-03-27 NOTE — ED Provider Notes (Signed)
[place] EMERGENCY DEPARTMENT AT [facility] Provider Note   CSN: [id number] Arrival date & time: 03/27/23  2216     History {Add pertinent medical, surgical, social history, OB history to HPI:1} Chief Complaint  Patient presents with   Hip Pain   Leg Pain    Shawna [name] is a 57 y.o. female.  HPI     Home Medications Prior to Admission medications   Medication Sig Start Date End Date Taking? Authorizing Provider  acetaminophen (TYLENOL) 500 MG tablet Take 500 mg by mouth every 6 (six) hours as needed. Patient not taking: Reported on 12/26/2021    [provider]  albuterol (PROVENTIL HFA;VENTOLIN HFA) 108 (90 Base) MCG/ACT inhaler Inhale 2 puffs into the lungs every 6 (six) hours as needed for wheezing or shortness of breath. 01/16/16   Jennye Moccasin, MD  aluminum-magnesium hydroxide-simethicone (MAALOX) 200-200-20 MG/5ML SUSP Take 30 mLs by mouth 4 (four) times daily -  before meals and at bedtime. Patient not taking: Reported on 08/03/2022 02/28/19   Sharman Cheek, MD  atorvastatin (LIPITOR) 10 MG tablet Take 10 mg by mouth at bedtime. 08/12/22   [provider]  buPROPion (WELLBUTRIN SR) 150 MG 12 hr tablet Take 150 mg by mouth daily. 11/03/22   [provider]  clonazePAM (KLONOPIN) 0.5 MG tablet Take 0.5 mg by mouth 2 (two) times daily as needed for anxiety.    [provider]  clonazePAM (KLONOPIN) 1 MG tablet Take 0.5 mg by mouth 2 (two) times daily as needed for anxiety. 10/15/22   [provider]  dexlansoprazole (DEXILANT) 60 MG capsule Take 1 capsule by mouth daily. Patient not taking: Reported on 12/26/2021    [provider]  dicyclomine (BENTYL) 20 MG tablet Take 1 tablet (20 mg total) by mouth 2 (two) times daily. 01/16/23   Linwood Dibbles, MD  DULoxetine 40 MG CPEP Take 40 mg by mouth daily. Patient not taking: Reported on 12/26/2021 07/27/16   Pucilowska, Braulio Conte B, MD  famotidine (PEPCID) 20 MG tablet Take  1 tablet (20 mg total) by mouth 2 (two) times daily. 02/28/19   Sharman Cheek, MD  fexofenadine (ALLEGRA) 180 MG tablet Take 180 mg by mouth daily.    [provider]  FLUoxetine (PROZAC) 20 MG capsule Take 20 mg by mouth daily. 07/16/22   [provider]  FLUoxetine (PROZAC) 40 MG capsule Take 40 mg by mouth daily. Patient not taking: Reported on 11/04/2022    [provider]  fluticasone (FLONASE) 50 MCG/ACT nasal spray Place 2 sprays into both nostrils daily.     [provider]  hydrOXYzine (ATARAX) 25 MG tablet Take 25 mg by mouth 4 (four) times daily. 07/30/22   [provider]  hydrOXYzine (ATARAX/VISTARIL) 50 MG tablet Take 50 mg by mouth every 6 (six) hours as needed.     [provider]  hydrOXYzine (VISTARIL) 50 MG capsule Take 100 mg by mouth every 8 (eight) hours as needed. 08/12/22   [provider]  lidocaine (XYLOCAINE) 2 % solution Use as directed 15 mLs in the mouth or throat as needed for mouth pain. Patient not taking: Reported on 08/03/2022 04/13/19   Chesley Noon, MD  lidocaine (XYLOCAINE) 5 % ointment Apply 1 Application topically daily as needed. 08/17/22   [provider]  MAGNESIUM PO Take 1 tablet by mouth daily.    [provider]  methocarbamol (ROBAXIN) 500 MG tablet Take 1 tablet (500 mg total) by mouth  every 8 (eight) hours as needed for muscle spasms. 03/26/23   Gilda Crease, MD  metoCLOPramide (REGLAN) 10 MG tablet Take 1 tablet (10 mg total) by mouth every 6 (six) hours as needed. Patient not taking: Reported on 12/26/2021 02/28/19   Sharman Cheek, MD  metoCLOPramide (REGLAN) 10 MG tablet Take 1 tablet (10 mg total) by mouth every 8 (eight) hours as needed for refractory nausea / vomiting. Patient not taking: Reported on 12/26/2021 04/18/19   Felicie Morn, NP  ondansetron (ZOFRAN) 4 MG tablet Take 1 tablet (4 mg total) by mouth every 8 (eight) hours as needed for nausea or  vomiting. 10/13/22   Curatolo, Adam, DO  ondansetron (ZOFRAN-ODT) 8 MG disintegrating tablet Take 1 tablet (8 mg total) by mouth every 8 (eight) hours as needed for nausea or vomiting. 01/16/23   Linwood Dibbles, MD  pantoprazole (PROTONIX) 20 MG tablet Take 1 tablet (20 mg total) by mouth daily. 01/16/23   Linwood Dibbles, MD  predniSONE (DELTASONE) 20 MG tablet Take 2 tablets (40 mg total) by mouth daily with breakfast. 11/04/22   Bing Neighbors, NP  predniSONE (DELTASONE) 20 MG tablet Take 2 tablets (40 mg total) by mouth daily. 11/09/22   White, Elita Boone, NP  rOPINIRole (REQUIP) 2 MG tablet Take 2 mg by mouth 2 (two) times daily. 08/17/22   [provider]  sucralfate (CARAFATE) 1 g tablet Take 1 g by mouth 4 (four) times daily. 08/17/22   [provider]  zolpidem (AMBIEN) 10 MG tablet Take 10 mg by mouth at bedtime as needed for sleep. Patient not taking: Reported on 11/04/2022    [provider]      Allergies    Haloperidol, Hydrocodone-acetaminophen, Morphine, Morphine and codeine, Pravastatin, Pravastatin sodium, Promethazine hcl, Fentanyl, and Propofol    Review of Systems   Review of Systems  Physical Exam Updated Vital Signs BP 116/69 (BP Location: Left Arm)   Pulse 95   Temp 98.2 F (36.8 C) (Oral)   Resp 17   Ht 1.676 m (5\' 6" )   Wt 75.8 kg   SpO2 96%   BMI 26.95 kg/m  Physical Exam  ED Results / Procedures / Treatments   Labs (all labs ordered are listed, but only abnormal results are displayed) Labs Reviewed - No data to display  EKG None  Radiology No results found.  Procedures Procedures  {Document cardiac monitor, telemetry assessment procedure when appropriate:1}  Medications Ordered in ED Medications  ketorolac (TORADOL) 30 MG/ML injection 30 mg (has no administration in time range)  oxyCODONE-acetaminophen (PERCOCET/ROXICET) 5-325 MG per tablet 1 tablet (has no administration in time range)  ondansetron (ZOFRAN-ODT)  disintegrating tablet 4 mg (has no administration in time range)    ED Course/ Medical Decision Making/ A&P   {   Click here for ABCD2, HEART and other calculatorsREFRESH Note before signing :1}                              Medical Decision Making Risk Prescription drug management.   ***  {Document critical care time when appropriate:1} {Document review of labs and clinical decision tools ie heart score, Chads2Vasc2 etc:1}  {Document your independent review of radiology images, and any outside records:1} {Document your discussion with family members, caretakers, and with consultants:1} {Document social determinants of health affecting pt's care:1} {Document your decision making why or why not admission, treatments were needed:1} Final Clinical Impression(s) / ED  Diagnoses Final diagnoses:  None    Rx / DC Orders ED Discharge Orders     None

## 2023-03-27 NOTE — ED Triage Notes (Signed)
Pt BIB [facility] with c/o right hip pain that radiates down her right leg. Pt was seen for the same yesterday. Pt able to ambulate. Pt denies urinary/stool incontinence.

## 2023-03-28 MED ORDER — GABAPENTIN 300 MG PO CAPS: 300.0000 mg | ORAL_CAPSULE | Freq: Three times a day (TID) | ORAL | 0 refills | Status: DC

## 2023-03-28 MED ORDER — NAPROXEN 500 MG PO TABS: 500.0000 mg | ORAL_TABLET | Freq: Two times a day (BID) | ORAL | 0 refills | Status: DC

## 2023-03-28 NOTE — Discharge Instructions (Signed)
You were seen today for worsening sciatica symptoms.  Start naproxen daily.  You may also start gabapentin.  Follow-up closely with your primary doctor.  If you develop bowel or bladder issues or any new weakness, numbness, tingling of the legs, you should be reevaluated.

## 2023-04-09 ENCOUNTER — Other Ambulatory Visit: Payer: Self-pay | Admitting: Internal Medicine

## 2023-04-09 ENCOUNTER — Other Ambulatory Visit: Payer: Self-pay

## 2023-04-09 ENCOUNTER — Emergency Department (HOSPITAL_COMMUNITY)
Admission: EM | Admit: 2023-04-09 | Discharge: 2023-04-09 | Disposition: A | Discharge status: Home / Self Care | Payer: 59 | Attending: Emergency Medicine | Admitting: Emergency Medicine

## 2023-04-09 DIAGNOSIS — M79672 Pain in left foot: Secondary | ICD-10-CM | POA: Diagnosis not present

## 2023-04-09 DIAGNOSIS — M79671 Pain in right foot: Secondary | ICD-10-CM | POA: Diagnosis not present

## 2023-04-09 DIAGNOSIS — M79605 Pain in left leg: Secondary | ICD-10-CM | POA: Insufficient documentation

## 2023-04-09 DIAGNOSIS — M79604 Pain in right leg: Secondary | ICD-10-CM | POA: Insufficient documentation

## 2023-04-09 DIAGNOSIS — M5126 Other intervertebral disc displacement, lumbar region: Secondary | ICD-10-CM

## 2023-04-09 MED ORDER — NAPROXEN 500 MG PO TABS: 500.0000 mg | ORAL_TABLET | Freq: Two times a day (BID) | ORAL | 0 refills | Status: AC

## 2023-04-09 MED ORDER — KETOROLAC TROMETHAMINE 15 MG/ML IJ SOLN
15.0000 mg | Freq: Once | INTRAMUSCULAR | Status: AC
  Administered 2023-04-09: 15 mg via INTRAMUSCULAR
  Filled 2023-04-09: qty 1

## 2023-04-09 MED ORDER — MELOXICAM 15 MG PO TABS: 15.0000 mg | ORAL_TABLET | Freq: Every day | ORAL | 0 refills | Status: DC

## 2023-04-09 MED ORDER — DEXAMETHASONE SODIUM PHOSPHATE 10 MG/ML IJ SOLN
10.0000 mg | Freq: Once | INTRAMUSCULAR | Status: AC
  Administered 2023-04-09: 10 mg via INTRAMUSCULAR
  Filled 2023-04-09: qty 1

## 2023-04-09 MED ORDER — METHYLPREDNISOLONE 4 MG PO TBPK: ORAL_TABLET | ORAL | 0 refills | Status: DC

## 2023-04-09 NOTE — ED Provider Notes (Signed)
[place] EMERGENCY DEPARTMENT AT [facility] Provider Note   CSN: [id number] Arrival date & time: 04/09/23  0104     History  Chief Complaint  Patient presents with   Leg Pain     Shawna [name] is a 57 y.o. female.  Patient presents to the emergency room complaining of chronic bilateral leg pain and right-sided sciatica.  Patient is also concerned because she states that when she awakens in the morning she has cold discolored feet.  She states this gets better throughout the day.  2 days ago she endorses weakness with difficulty get out of bed but that has resolved.  She denies any injury or trauma. Denies urinary incontinence, fecal incontinence, urinary retention, saddle anesthesia  HPI     Home Medications Prior to Admission medications   Medication Sig Start Date End Date Taking? Authorizing Provider  meloxicam (MOBIC) 15 MG tablet Take 1 tablet (15 mg total) by mouth daily. 04/09/23 05/09/23 Yes Darrick Grinder, PA-C  methylPREDNISolone (MEDROL DOSEPAK) 4 MG TBPK tablet Take as directed per package instructions 04/09/23  Yes Darrick Grinder, PA-C  acetaminophen (TYLENOL) 500 MG tablet Take 500 mg by mouth every 6 (six) hours as needed. Patient not taking: Reported on 12/26/2021    [provider]  albuterol (PROVENTIL HFA;VENTOLIN HFA) 108 (90 Base) MCG/ACT inhaler Inhale 2 puffs into the lungs every 6 (six) hours as needed for wheezing or shortness of breath. 01/16/16   Jennye Moccasin, MD  aluminum-magnesium hydroxide-simethicone (MAALOX) 200-200-20 MG/5ML SUSP Take 30 mLs by mouth 4 (four) times daily -  before meals and at bedtime. Patient not taking: Reported on 08/03/2022 02/28/19   Sharman Cheek, MD  atorvastatin (LIPITOR) 10 MG tablet Take 10 mg by mouth at bedtime. 08/12/22   [provider]  buPROPion (WELLBUTRIN SR) 150 MG 12 hr tablet Take 150 mg by mouth daily. 11/03/22   [provider]  clonazePAM (KLONOPIN) 0.5 MG tablet Take  0.5 mg by mouth 2 (two) times daily as needed for anxiety.    [provider]  clonazePAM (KLONOPIN) 1 MG tablet Take 0.5 mg by mouth 2 (two) times daily as needed for anxiety. 10/15/22   [provider]  dexlansoprazole (DEXILANT) 60 MG capsule Take 1 capsule by mouth daily. Patient not taking: Reported on 12/26/2021    [provider]  dicyclomine (BENTYL) 20 MG tablet Take 1 tablet (20 mg total) by mouth 2 (two) times daily. 01/16/23   Linwood Dibbles, MD  DULoxetine 40 MG CPEP Take 40 mg by mouth daily. Patient not taking: Reported on 12/26/2021 07/27/16   Pucilowska, Braulio Conte B, MD  famotidine (PEPCID) 20 MG tablet Take 1 tablet (20 mg total) by mouth 2 (two) times daily. 02/28/19   Sharman Cheek, MD  fexofenadine (ALLEGRA) 180 MG tablet Take 180 mg by mouth daily.    [provider]  FLUoxetine (PROZAC) 20 MG capsule Take 20 mg by mouth daily. 07/16/22   [provider]  FLUoxetine (PROZAC) 40 MG capsule Take 40 mg by mouth daily. Patient not taking: Reported on 11/04/2022    [provider]  fluticasone (FLONASE) 50 MCG/ACT nasal spray Place 2 sprays into both nostrils daily.     [provider]  gabapentin (NEURONTIN) 300 MG capsule Take 1 capsule (300 mg total) by mouth 3 (three) times daily. 03/28/23   Horton, Mayer Masker, MD  hydrOXYzine (ATARAX) 25 MG tablet Take 25 mg by mouth 4 (four) times daily.  07/30/22   [provider]  hydrOXYzine (ATARAX/VISTARIL) 50 MG tablet Take 50 mg by mouth every 6 (six) hours as needed.     [provider]  hydrOXYzine (VISTARIL) 50 MG capsule Take 100 mg by mouth every 8 (eight) hours as needed. 08/12/22   [provider]  lidocaine (XYLOCAINE) 2 % solution Use as directed 15 mLs in the mouth or throat as needed for mouth pain. Patient not taking: Reported on 08/03/2022 04/13/19   Chesley Noon, MD  lidocaine (XYLOCAINE) 5 % ointment Apply 1 Application topically daily as needed.  08/17/22   [provider]  MAGNESIUM PO Take 1 tablet by mouth daily.    [provider]  methocarbamol (ROBAXIN) 500 MG tablet Take 1 tablet (500 mg total) by mouth every 8 (eight) hours as needed for muscle spasms. 03/26/23   Gilda Crease, MD  metoCLOPramide (REGLAN) 10 MG tablet Take 1 tablet (10 mg total) by mouth every 6 (six) hours as needed. Patient not taking: Reported on 12/26/2021 02/28/19   Sharman Cheek, MD  metoCLOPramide (REGLAN) 10 MG tablet Take 1 tablet (10 mg total) by mouth every 8 (eight) hours as needed for refractory nausea / vomiting. Patient not taking: Reported on 12/26/2021 04/18/19   Felicie Morn, NP  ondansetron (ZOFRAN) 4 MG tablet Take 1 tablet (4 mg total) by mouth every 8 (eight) hours as needed for nausea or vomiting. 10/13/22   Curatolo, Adam, DO  ondansetron (ZOFRAN-ODT) 8 MG disintegrating tablet Take 1 tablet (8 mg total) by mouth every 8 (eight) hours as needed for nausea or vomiting. 01/16/23   Linwood Dibbles, MD  pantoprazole (PROTONIX) 20 MG tablet Take 1 tablet (20 mg total) by mouth daily. 01/16/23   Linwood Dibbles, MD  predniSONE (DELTASONE) 20 MG tablet Take 2 tablets (40 mg total) by mouth daily with breakfast. 11/04/22   Bing Neighbors, NP  predniSONE (DELTASONE) 20 MG tablet Take 2 tablets (40 mg total) by mouth daily. 11/09/22   White, Elita Boone, NP  rOPINIRole (REQUIP) 2 MG tablet Take 2 mg by mouth 2 (two) times daily. 08/17/22   [provider]  sucralfate (CARAFATE) 1 g tablet Take 1 g by mouth 4 (four) times daily. 08/17/22   [provider]  zolpidem (AMBIEN) 10 MG tablet Take 10 mg by mouth at bedtime as needed for sleep. Patient not taking: Reported on 11/04/2022    [provider]      Allergies    Haloperidol, Hydrocodone-acetaminophen, Morphine, Morphine and codeine, Pravastatin, Pravastatin sodium, Promethazine hcl, Fentanyl, and Propofol    Review of Systems   Review of Systems  Physical  Exam Updated Vital Signs BP 113/74 (BP Location: Left Arm)   Pulse 80   Temp 98.1 F (36.7 C) (Oral)   Resp 18   SpO2 99%  Physical Exam Vitals and nursing note reviewed.  HENT:     Head: Normocephalic and atraumatic.  Eyes:     Pupils: Pupils are equal, round, and reactive to light.  Pulmonary:     Effort: Pulmonary effort is normal. No respiratory distress.  Musculoskeletal:        General: No tenderness or signs of injury. Normal range of motion.     Cervical back: Normal range of motion.     Comments: Range of motion of bilateral hips normal. No significant pain with passive ROM. No visible injuries/deformities  Skin:    General: Skin is dry.     Comments: Patient with  strong palpable bilateral pedal pulses.   Neurological:     Mental Status: She is alert.  Psychiatric:        Speech: Speech normal.        Behavior: Behavior normal.     ED Results / Procedures / Treatments   Labs (all labs ordered are listed, but only abnormal results are displayed) Labs Reviewed - No data to display  EKG None  Radiology No results found.  Procedures Procedures    Medications Ordered in ED Medications  ketorolac (TORADOL) 15 MG/ML injection 15 mg (15 mg Intramuscular Given 04/09/23 0427)  dexamethasone (DECADRON) injection 10 mg (10 mg Intramuscular Given 04/09/23 0428)    ED Course/ Medical Decision Making/ A&P                                 Medical Decision Making Risk Prescription drug management.   This patient presents to the ED for concern of bilateral foot pain and leg pain, this involves an extensive number of treatment options, and is a complaint that carries with it a high risk of complications and morbidity.  The differential diagnosis includes claudication, sciatica, others   Co morbidities that complicate the patient evaluation  Sciatica   Lab Tests:  I Ordered, and personally interpreted labs.  The pertinent results include:  N/A   Imaging  Studies ordered:  No weakness, no red flag symptoms to indicate spinal imaging. No injury or exam findings to suggest fracture or dislocation requiring plain films of the legs   Problem List / ED Course / Critical interventions / Medication management   I ordered medication including decadron and toradol for inflammation  Reevaluation of the patient after these medicines showed that the patient improved I have reviewed the patients home medicines and have made adjustments as needed   Social Determinants of Health:  Patient is a daily tobacco user   Test / Admission - Considered:  Patient with findings consistent with sciatica.  Concern for possible claudication based on patient's history but patient is currently perfusing appropriately with brisk cap refill in both feet and strong pedal pulses.  Patient requesting referral to vascular surgery which was provided.  Patient treated here with Toradol and Decadron and prescription sent for meloxicam and Medrol Dosepak.  Patient already prescribed Robaxin.  Discharged home with return precautions.         Final Clinical Impression(s) / ED Diagnoses Final diagnoses:  Bilateral leg and foot pain    Rx / DC Orders ED Discharge Orders          Ordered    meloxicam (MOBIC) 15 MG tablet  Daily        04/09/23 0349    methylPREDNISolone (MEDROL DOSEPAK) 4 MG TBPK tablet        04/09/23 0349              Darrick Grinder, PA-C 04/09/23 0517    Palumbo, April, MD 04/10/23 0559

## 2023-04-09 NOTE — Discharge Instructions (Signed)
Please take the prescribed medications as directed. Discontinue other NSAIDS while taking meloxicam. I provided a phone number for follow up with vascular surgery. I also recommend follow up with primary care.

## 2023-04-09 NOTE — ED Triage Notes (Signed)
Patient reports chronic bilateral leg pain for several years , pain worsened this week , denies injury / ambulatory .

## 2023-05-03 ENCOUNTER — Ambulatory Visit: Payer: 59 | Admitting: Internal Medicine

## 2023-05-04 ENCOUNTER — Ambulatory Visit: Payer: 59 | Admitting: Internal Medicine

## 2023-05-10 ENCOUNTER — Emergency Department (HOSPITAL_COMMUNITY)
Admission: EM | Admit: 2023-05-10 | Discharge: 2023-05-11 | Discharge status: Against Medical Advice | Payer: 59 | Attending: Emergency Medicine | Admitting: Emergency Medicine

## 2023-05-10 ENCOUNTER — Emergency Department (HOSPITAL_COMMUNITY): Payer: 59

## 2023-05-10 ENCOUNTER — Encounter (HOSPITAL_COMMUNITY): Payer: Self-pay | Admitting: Emergency Medicine

## 2023-05-10 DIAGNOSIS — R079 Chest pain, unspecified: Secondary | ICD-10-CM | POA: Insufficient documentation

## 2023-05-10 DIAGNOSIS — R509 Fever, unspecified: Secondary | ICD-10-CM | POA: Insufficient documentation

## 2023-05-10 DIAGNOSIS — R0981 Nasal congestion: Secondary | ICD-10-CM | POA: Insufficient documentation

## 2023-05-10 DIAGNOSIS — R0602 Shortness of breath: Secondary | ICD-10-CM | POA: Diagnosis not present

## 2023-05-10 DIAGNOSIS — Z5321 Procedure and treatment not carried out due to patient leaving prior to being seen by health care provider: Secondary | ICD-10-CM | POA: Diagnosis not present

## 2023-05-10 DIAGNOSIS — R059 Cough, unspecified: Secondary | ICD-10-CM | POA: Insufficient documentation

## 2023-05-10 LAB — CBC WITH DIFFERENTIAL/PLATELET
Abs Immature Granulocytes: 0.09 10*3/uL — ABNORMAL HIGH (ref 0.00–0.07)
Basophils Absolute: 0.1 10*3/uL (ref 0.0–0.1)
Basophils Relative: 0 %
Eosinophils Absolute: 0 10*3/uL (ref 0.0–0.5)
Eosinophils Relative: 0 %
HCT: 39.5 % (ref 36.0–46.0)
Hemoglobin: 13.9 g/dL (ref 12.0–15.0)
Immature Granulocytes: 1 %
Lymphocytes Relative: 25 %
Lymphs Abs: 2.8 10*3/uL (ref 0.7–4.0)
MCH: 32 pg (ref 26.0–34.0)
MCHC: 35.2 g/dL (ref 30.0–36.0)
MCV: 91 fL (ref 80.0–100.0)
Monocytes Absolute: 0.6 10*3/uL (ref 0.1–1.0)
Monocytes Relative: 6 %
Neutro Abs: 7.7 10*3/uL (ref 1.7–7.7)
Neutrophils Relative %: 68 %
Platelets: 454 10*3/uL — ABNORMAL HIGH (ref 150–400)
RBC: 4.34 MIL/uL (ref 3.87–5.11)
RDW: 13.1 % (ref 11.5–15.5)
WBC: 11.3 10*3/uL — ABNORMAL HIGH (ref 4.0–10.5)
nRBC: 0 % (ref 0.0–0.2)

## 2023-05-10 LAB — COMPREHENSIVE METABOLIC PANEL
ALT: 20 U/L (ref 0–44)
AST: 23 U/L (ref 15–41)
Albumin: 3.9 g/dL (ref 3.5–5.0)
Alkaline Phosphatase: 95 U/L (ref 38–126)
Anion gap: 15 (ref 5–15)
BUN: 19 mg/dL (ref 6–20)
CO2: 19 mmol/L — ABNORMAL LOW (ref 22–32)
Calcium: 9.3 mg/dL (ref 8.9–10.3)
Chloride: 101 mmol/L (ref 98–111)
Creatinine, Ser: 1.06 mg/dL — ABNORMAL HIGH (ref 0.44–1.00)
GFR, Estimated: >60 mL/min (ref >60)
Glucose, Bld: 394 mg/dL — ABNORMAL HIGH (ref 70–99)
Potassium: 4.3 mmol/L (ref 3.5–5.1)
Sodium: 135 mmol/L (ref 135–145)
Total Bilirubin: 0.7 mg/dL (ref 0.0–1.2)
Total Protein: 6.7 g/dL (ref 6.5–8.1)

## 2023-05-10 LAB — TROPONIN I (HIGH SENSITIVITY): Troponin I (High Sensitivity): 5 ng/L (ref <18)

## 2023-05-10 LAB — I-STAT CG4 LACTIC ACID, ED: Lactic Acid, Venous: 3.5 mmol/L (ref 0.5–1.9)

## 2023-05-10 NOTE — ED Provider Triage Note (Signed)
 Emergency Medicine Provider Triage Evaluation Note  Shawna [name] , a 58 y.o. female  was evaluated in triage.  Pt complains of cough, congestion, shortness of breath, chest pain. States she was diagnosed with [facility]. Prescribed amoxicillin  which she has been taking as prescribed. She has not completed the course yet. States in the past few days she has started to feel worse. Notes she has had a fever of 101 at home earlier today.   Review of Systems  Positive:  Negative:   Physical Exam  BP 123/86 (BP Location: Right Arm)   Pulse (!) 120   Temp 98.3 F (36.8 C) (Oral)   Resp (!) 22   SpO2 95%  Gen:   Awake, no distress   Resp:  Normal effort  MSK:   Moves extremities without difficulty  Other:    Medical Decision Making  Medically screening exam initiated at 9:38 PM.  Appropriate orders placed.  Shawna [name] was informed that the remainder of the evaluation will be completed by another provider, this initial triage assessment does not replace that evaluation, and the importance of remaining in the ED until their evaluation is complete.     Shawna [name] 05/10/23 2141

## 2023-05-10 NOTE — ED Triage Notes (Addendum)
 Pt having SOB, chest, pain ,fevers, cough. DX with [facility] by urgent care on the 6th and been taking amoxicillin. States cannot sleep at night due to coughing and SOB. Tachy at 119. Cough is unproductive.

## 2023-05-11 ENCOUNTER — Emergency Department (HOSPITAL_COMMUNITY): Admission: EM | Admit: 2023-05-11 | Discharge: 2023-05-12 | Discharge status: Against Medical Advice | Payer: 59

## 2023-05-11 LAB — CBG MONITORING, ED: Glucose-Capillary: 267 mg/dL — ABNORMAL HIGH (ref 70–99)

## 2023-05-11 NOTE — ED Notes (Signed)
 Pt left ama...KM

## 2023-05-15 LAB — CULTURE, BLOOD (ROUTINE X 2)
Culture: NO GROWTH
Special Requests: ADEQUATE

## 2023-05-17 ENCOUNTER — Other Ambulatory Visit: Payer: Self-pay

## 2023-05-17 ENCOUNTER — Inpatient Hospital Stay (HOSPITAL_BASED_OUTPATIENT_CLINIC_OR_DEPARTMENT_OTHER)
Admission: EM | Admit: 2023-05-17 | Discharge: 2023-05-19 | DRG: 190 | Disposition: A | Discharge status: Home / Self Care | Payer: 59 | Attending: Internal Medicine | Admitting: Internal Medicine

## 2023-05-17 ENCOUNTER — Emergency Department (HOSPITAL_BASED_OUTPATIENT_CLINIC_OR_DEPARTMENT_OTHER): Payer: 59 | Admitting: Radiology

## 2023-05-17 ENCOUNTER — Emergency Department (HOSPITAL_BASED_OUTPATIENT_CLINIC_OR_DEPARTMENT_OTHER): Payer: 59

## 2023-05-17 ENCOUNTER — Encounter (HOSPITAL_BASED_OUTPATIENT_CLINIC_OR_DEPARTMENT_OTHER): Payer: Self-pay | Admitting: Emergency Medicine

## 2023-05-17 DIAGNOSIS — F4323 Adjustment disorder with mixed anxiety and depressed mood: Secondary | ICD-10-CM | POA: Diagnosis present

## 2023-05-17 DIAGNOSIS — E119 Type 2 diabetes mellitus without complications: Secondary | ICD-10-CM

## 2023-05-17 DIAGNOSIS — Z885 Allergy status to narcotic agent status: Secondary | ICD-10-CM

## 2023-05-17 DIAGNOSIS — R052 Subacute cough: Secondary | ICD-10-CM

## 2023-05-17 DIAGNOSIS — J441 Chronic obstructive pulmonary disease with (acute) exacerbation: Secondary | ICD-10-CM | POA: Diagnosis not present

## 2023-05-17 DIAGNOSIS — R112 Nausea with vomiting, unspecified: Secondary | ICD-10-CM | POA: Diagnosis not present

## 2023-05-17 DIAGNOSIS — K219 Gastro-esophageal reflux disease without esophagitis: Secondary | ICD-10-CM | POA: Diagnosis present

## 2023-05-17 DIAGNOSIS — Z66 Do not resuscitate: Secondary | ICD-10-CM | POA: Diagnosis present

## 2023-05-17 DIAGNOSIS — R0789 Other chest pain: Secondary | ICD-10-CM

## 2023-05-17 DIAGNOSIS — F1320 Sedative, hypnotic or anxiolytic dependence, uncomplicated: Secondary | ICD-10-CM | POA: Diagnosis present

## 2023-05-17 DIAGNOSIS — R0902 Hypoxemia: Principal | ICD-10-CM

## 2023-05-17 DIAGNOSIS — R0602 Shortness of breath: Secondary | ICD-10-CM

## 2023-05-17 DIAGNOSIS — E1165 Type 2 diabetes mellitus with hyperglycemia: Secondary | ICD-10-CM

## 2023-05-17 DIAGNOSIS — F17200 Nicotine dependence, unspecified, uncomplicated: Secondary | ICD-10-CM | POA: Diagnosis present

## 2023-05-17 DIAGNOSIS — Z794 Long term (current) use of insulin: Secondary | ICD-10-CM

## 2023-05-17 DIAGNOSIS — J9601 Acute respiratory failure with hypoxia: Secondary | ICD-10-CM | POA: Diagnosis not present

## 2023-05-17 DIAGNOSIS — R1032 Left lower quadrant pain: Secondary | ICD-10-CM

## 2023-05-17 DIAGNOSIS — Z884 Allergy status to anesthetic agent status: Secondary | ICD-10-CM

## 2023-05-17 DIAGNOSIS — I10 Essential (primary) hypertension: Secondary | ICD-10-CM | POA: Diagnosis present

## 2023-05-17 DIAGNOSIS — E785 Hyperlipidemia, unspecified: Secondary | ICD-10-CM | POA: Diagnosis present

## 2023-05-17 DIAGNOSIS — R079 Chest pain, unspecified: Secondary | ICD-10-CM | POA: Diagnosis present

## 2023-05-17 DIAGNOSIS — F17210 Nicotine dependence, cigarettes, uncomplicated: Secondary | ICD-10-CM | POA: Diagnosis present

## 2023-05-17 DIAGNOSIS — Z79899 Other long term (current) drug therapy: Secondary | ICD-10-CM

## 2023-05-17 DIAGNOSIS — Z1152 Encounter for screening for COVID-19: Secondary | ICD-10-CM

## 2023-05-17 DIAGNOSIS — Z7984 Long term (current) use of oral hypoglycemic drugs: Secondary | ICD-10-CM

## 2023-05-17 DIAGNOSIS — Z888 Allergy status to other drugs, medicaments and biological substances status: Secondary | ICD-10-CM

## 2023-05-17 DIAGNOSIS — F1220 Cannabis dependence, uncomplicated: Secondary | ICD-10-CM | POA: Diagnosis present

## 2023-05-17 DIAGNOSIS — R739 Hyperglycemia, unspecified: Secondary | ICD-10-CM

## 2023-05-17 LAB — COMPREHENSIVE METABOLIC PANEL
ALT: 14 U/L (ref 0–44)
AST: 14 U/L — ABNORMAL LOW (ref 15–41)
Albumin: 4.1 g/dL (ref 3.5–5.0)
Alkaline Phosphatase: 96 U/L (ref 38–126)
Anion gap: 11 (ref 5–15)
BUN: 17 mg/dL (ref 6–20)
CO2: 23 mmol/L (ref 22–32)
Calcium: 9.1 mg/dL (ref 8.9–10.3)
Chloride: 103 mmol/L (ref 98–111)
Creatinine, Ser: 0.87 mg/dL (ref 0.44–1.00)
GFR, Estimated: >60 mL/min (ref >60)
Glucose, Bld: 253 mg/dL — ABNORMAL HIGH (ref 70–99)
Potassium: 3.7 mmol/L (ref 3.5–5.1)
Sodium: 137 mmol/L (ref 135–145)
Total Bilirubin: 0.7 mg/dL (ref 0.0–1.2)
Total Protein: 6.5 g/dL (ref 6.5–8.1)

## 2023-05-17 LAB — URINALYSIS, ROUTINE W REFLEX MICROSCOPIC
Bacteria, UA: NONE SEEN
Bilirubin Urine: NEGATIVE
Glucose, UA: 500 mg/dL — AB
Hgb urine dipstick: NEGATIVE
Ketones, ur: NEGATIVE mg/dL
Leukocytes,Ua: NEGATIVE
Nitrite: NEGATIVE
Specific Gravity, Urine: 1.02 (ref 1.005–1.030)
pH: 6.5 (ref 5.0–8.0)

## 2023-05-17 LAB — RESP PANEL BY RT-PCR (RSV, FLU A&B, COVID)  RVPGX2
Influenza A by PCR: NEGATIVE
Influenza B by PCR: NEGATIVE
Resp Syncytial Virus by PCR: NEGATIVE
SARS Coronavirus 2 by RT PCR: NEGATIVE

## 2023-05-17 LAB — ETHANOL: Alcohol, Ethyl (B): <10 mg/dL (ref <10)

## 2023-05-17 LAB — CBC
HCT: 39.3 % (ref 36.0–46.0)
Hemoglobin: 13.6 g/dL (ref 12.0–15.0)
MCH: 30.7 pg (ref 26.0–34.0)
MCHC: 34.6 g/dL (ref 30.0–36.0)
MCV: 88.7 fL (ref 80.0–100.0)
Platelets: 383 10*3/uL (ref 150–400)
RBC: 4.43 MIL/uL (ref 3.87–5.11)
RDW: 12.5 % (ref 11.5–15.5)
WBC: 13.3 10*3/uL — ABNORMAL HIGH (ref 4.0–10.5)
nRBC: 0 % (ref 0.0–0.2)

## 2023-05-17 LAB — APTT: aPTT: 25 s (ref 24–36)

## 2023-05-17 LAB — RAPID URINE DRUG SCREEN, HOSP PERFORMED
Amphetamines: NOT DETECTED
Barbiturates: NOT DETECTED
Benzodiazepines: POSITIVE — AB
Cocaine: NOT DETECTED
Opiates: NOT DETECTED
Tetrahydrocannabinol: POSITIVE — AB

## 2023-05-17 LAB — TROPONIN I (HIGH SENSITIVITY)
Troponin I (High Sensitivity): 3 ng/L (ref <18)
Troponin I (High Sensitivity): 3 ng/L (ref <18)

## 2023-05-17 LAB — PROTIME-INR
INR: 0.9 (ref 0.8–1.2)
Prothrombin Time: 12.5 s (ref 11.4–15.2)

## 2023-05-17 LAB — LACTIC ACID, PLASMA: Lactic Acid, Venous: 1.6 mmol/L (ref 0.5–1.9)

## 2023-05-17 LAB — LIPASE, BLOOD: Lipase: 13 U/L (ref 11–51)

## 2023-05-17 LAB — BRAIN NATRIURETIC PEPTIDE: B Natriuretic Peptide: 35.3 pg/mL (ref 0.0–100.0)

## 2023-05-17 MED ORDER — IOHEXOL 350 MG/ML SOLN
100.0000 mL | Freq: Once | INTRAVENOUS | Status: AC | PRN
  Administered 2023-05-17: 100 mL via INTRAVENOUS

## 2023-05-17 MED ORDER — METHYLPREDNISOLONE SODIUM SUCC 125 MG IJ SOLR
125.0000 mg | Freq: Once | INTRAMUSCULAR | Status: AC
  Administered 2023-05-17: 125 mg via INTRAVENOUS
  Filled 2023-05-17: qty 2

## 2023-05-17 MED ORDER — ALBUTEROL SULFATE HFA 108 (90 BASE) MCG/ACT IN AERS
INHALATION_SPRAY | RESPIRATORY_TRACT | Status: AC
  Administered 2023-05-17: 2 via RESPIRATORY_TRACT
  Filled 2023-05-17: qty 6.7

## 2023-05-17 MED ORDER — OXYCODONE HCL 5 MG PO TABS
5.0000 mg | ORAL_TABLET | Freq: Once | ORAL | Status: AC
  Administered 2023-05-17: 5 mg via ORAL
  Filled 2023-05-17: qty 1

## 2023-05-17 MED ORDER — ALUM & MAG HYDROXIDE-SIMETH 200-200-20 MG/5ML PO SUSP
30.0000 mL | Freq: Once | ORAL | Status: AC
  Administered 2023-05-17: 30 mL via ORAL
  Filled 2023-05-17: qty 30

## 2023-05-17 MED ORDER — IPRATROPIUM-ALBUTEROL 0.5-2.5 (3) MG/3ML IN SOLN: 3.0000 mL | Freq: Once | RESPIRATORY_TRACT | Status: DC

## 2023-05-17 MED ORDER — ONDANSETRON HCL 4 MG/2ML IJ SOLN
4.0000 mg | Freq: Once | INTRAMUSCULAR | Status: AC
  Administered 2023-05-17: 4 mg via INTRAVENOUS
  Filled 2023-05-17: qty 2

## 2023-05-17 MED ORDER — IPRATROPIUM-ALBUTEROL 0.5-2.5 (3) MG/3ML IN SOLN
3.0000 mL | Freq: Once | RESPIRATORY_TRACT | Status: AC
  Administered 2023-05-17: 3 mL via RESPIRATORY_TRACT
  Filled 2023-05-17: qty 3

## 2023-05-17 MED ORDER — DICYCLOMINE HCL 10 MG PO CAPS
10.0000 mg | ORAL_CAPSULE | Freq: Once | ORAL | Status: AC
  Administered 2023-05-17: 10 mg via ORAL
  Filled 2023-05-17: qty 1

## 2023-05-17 MED ORDER — ALBUTEROL SULFATE HFA 108 (90 BASE) MCG/ACT IN AERS: 2.0000 | INHALATION_SPRAY | RESPIRATORY_TRACT | Status: DC | PRN

## 2023-05-17 NOTE — ED Notes (Signed)
Patient ambulated in hallway. Patient c/o weakness while ambulating. Lowest SpO2 obtained while ambulating was 89%. Max HR 115.

## 2023-05-17 NOTE — H&P (Signed)
Shawna [name] UJW:[id number] DOB: 07-Jul-1965 DOA: 05/17/2023     PCP: System, Provider Not In      Patient arrived to ER on 05/17/23 at 1529 Referred by Attending No att. providers found   Patient coming from:    home Lives With family      Chief Complaint:   Chief Complaint  Patient presents with   Pneumonia    HPI: Shawna [name] is a 58 y.o. female with medical history significant of COPD, HLD, HTN, DM2, IBS    Presented with    shortness of breath chest pain Patient comes in if chest pain and shortness of breath and vomiting and diarrhea.  She was diagnosed with pneumonia on 6 January was admitted and discharged on amoxicillin 500 mg p.o.  daily thereafter she started to have more but diarrhea she has not improved with her respiratory symptoms either. 2 days ago she started to have more of nausea vomiting diarrhea symptoms in addition which chest pain shortness of breath cough is persistent of yellow sputum Chest pain with radiation to the left side worsening shortness of breath with exertion abdominal pain and left upper and lower quadrants No blood in stools No prior history of CAD or blood clots but does has history of COPD    Denies significant ETOH intake   Does smoke  but interested in quitting  1/2 pack a day  Lab Results  Component Value Date   SARSCOV2NAA NEGATIVE 05/17/2023   SARSCOV2NAA NEGATIVE 01/03/2021    Regarding pertinent Chronic problems:    Hyperlipidemia - on statins Lipitor (atorvastatin)  Lipid Panel     Component Value Date/Time   CHOL 242 (H) 07/25/2016 1336   CHOL 160 10/28/2011 0457   TRIG 716 (H) 07/25/2016 1336   TRIG 344 (H) 10/28/2011 0457   HDL 36 (L) 07/25/2016 1336   HDL 23 (L) 10/28/2011 0457   CHOLHDL 6.7 07/25/2016 1336   VLDL UNABLE TO CALCULATE IF TRIGLYCERIDE OVER 400 mg/dL 56/21/3086 5784   VLDL 69 (H) 10/28/2011 0457   LDLCALC UNABLE TO CALCULATE IF TRIGLYCERIDE OVER 400 mg/dL 69/62/9528 4132   LDLCALC 68 10/28/2011  0457     HTN on       DM 2 -  Lab Results  Component Value Date   HGBA1C 5.4 07/25/2016   diet controlled     COPD - not  followed by pulmonology   not  on baseline oxygen          While in ER:     Oxygen saturation in the upper 80s with ambulation, improved with 2 L [place].  Troponin negative x 2.  UDS positive for benzos and THC.  COVID/flu/RSV negative.  Lactate normal.  BNP normal.  UA negative.  Lipase and LFTs normal.  WBC count 13.3.  CBG in the 200s, ho hx of DM. CTA chest negative for PE or pneumonia.  CT abdomen pelvis negative.  Patient was given Maalox, Bentyl, DuoNeb, albuterol, Solu-Medrol 125 mg, Zofran, and oxycodone.     Lab Orders         Blood Culture (routine x 2)         Resp panel by RT-PCR (RSV, Flu A&B, Covid) Anterior Nasal Swab         CBC         Comprehensive metabolic panel         Lipase, blood         Urinalysis, Routine w reflex microscopic -  Urine, Clean Catch         Brain natriuretic peptide         Protime-INR         APTT         Ethanol         Rapid urine drug screen (hospital performed)        CXR - COPD/chronic changes.   CTabd/pelvis -  non acute  CTA chest -  non acute, no PE,  no evidence of infiltrate  Following Medications were ordered in ER: Medications  albuterol (VENTOLIN HFA) 108 (90 Base) MCG/ACT inhaler 2 puff (2 puffs Inhalation Given 05/17/23 1557)  alum & mag hydroxide-simeth (MAALOX/MYLANTA) 200-200-20 MG/5ML suspension 30 mL (30 mLs Oral Given 05/17/23 1626)  dicyclomine (BENTYL) capsule 10 mg (10 mg Oral Given 05/17/23 1626)  oxyCODONE (Oxy IR/ROXICODONE) immediate release tablet 5 mg (5 mg Oral Given 05/17/23 1626)  ondansetron (ZOFRAN) injection 4 mg (4 mg Intravenous Given 05/17/23 1626)  iohexol (OMNIPAQUE) 350 MG/ML injection 100 mL (100 mLs Intravenous Contrast Given 05/17/23 1748)  ipratropium-albuterol (DUONEB) 0.5-2.5 (3) MG/3ML nebulizer solution 3 mL (3 mLs Nebulization Given 05/17/23 1925)  methylPREDNISolone  sodium succinate (SOLU-MEDROL) 125 mg/2 mL injection 125 mg (125 mg Intravenous Given 05/17/23 1913)     ED Triage Vitals  Encounter Vitals Group     BP 05/17/23 1537 129/79     Systolic BP Percentile --      Diastolic BP Percentile --      Pulse Rate 05/17/23 1537 (!) 124     Resp 05/17/23 1537 20     Temp 05/17/23 1537 98 F (36.7 C)     Temp Source 05/17/23 2143 Oral     SpO2 05/17/23 1537 97 %     Weight 05/17/23 2329 170 lb (77.1 kg)     Height 05/17/23 2329 5\' 6"  (1.676 m)     Head Circumference --      Peak Flow --      Pain Score 05/17/23 1537 8     Pain Loc --      Pain Education --      Exclude from Growth Chart --   WUXL(24)@     _________________________________________ Significant initial  Findings: Abnormal Labs Reviewed  CBC - Abnormal; Notable for the following components:      Result Value   WBC 13.3 (*)    All other components within normal limits  COMPREHENSIVE METABOLIC PANEL - Abnormal; Notable for the following components:   Glucose, Bld 253 (*)    AST 14 (*)    All other components within normal limits  URINALYSIS, ROUTINE W REFLEX MICROSCOPIC - Abnormal; Notable for the following components:   Glucose, UA 500 (*)    Protein, ur TRACE (*)    All other components within normal limits  RAPID URINE DRUG SCREEN, HOSP PERFORMED - Abnormal; Notable for the following components:   Benzodiazepines POSITIVE (*)    Tetrahydrocannabinol POSITIVE (*)    All other components within normal limits      _________________________ Troponin  ordered Cardiac Panel (last 3 results) Recent Labs    05/17/23 1602 05/17/23 1825  TROPONINIHS 3 3     ECG: Ordered Personally reviewed and interpreted by me showing: HR : 83 Rhythm: NSR,    nonspecific changes,  QTC 434  BNP (last 3 results) Recent Labs    05/17/23 1602  BNP 35.3     COVID-19 Labs  No results for input(s): "DDIMER", "FERRITIN", "  LDH", "CRP" in the last 72 hours.  Lab Results  Component  Value Date   SARSCOV2NAA NEGATIVE 05/17/2023   SARSCOV2NAA NEGATIVE 01/03/2021    The recent clinical data is shown below. Vitals:   05/17/23 2143 05/17/23 2145 05/17/23 2257 05/17/23 2329  BP:  108/73 (!) 139/94 (!) 139/94  Pulse:  90 89 89  Resp:  19 20 20   Temp: 98.5 F (36.9 C)  98.1 F (36.7 C) 98.1 F (36.7 C)  TempSrc: Oral  Oral Oral  SpO2:  96% 97%   Weight:    77.1 kg  Height:    5\' 6"  (1.676 m)    WBC     Component Value Date/Time   WBC 13.3 (H) 05/17/2023 1602   LYMPHSABS 2.8 05/10/2023 2158   LYMPHSABS 2.8 03/08/2014 0337   MONOABS 0.6 05/10/2023 2158   MONOABS 0.7 03/08/2014 0337   EOSABS 0.0 05/10/2023 2158   EOSABS 0.2 03/08/2014 0337   BASOSABS 0.1 05/10/2023 2158   BASOSABS 0.1 03/08/2014 0337    Lactic Acid, Venous    Component Value Date/Time   LATICACIDVEN 1.6 05/17/2023 1644       UA   no evidence of UTI     Urine analysis:    Component Value Date/Time   COLORURINE YELLOW 05/17/2023 1605   APPEARANCEUR CLEAR 05/17/2023 1605   APPEARANCEUR Clear 04/15/2015 1649   LABSPEC 1.020 05/17/2023 1605   LABSPEC 1.008 03/07/2014 0917   PHURINE 6.5 05/17/2023 1605   GLUCOSEU 500 (A) 05/17/2023 1605   GLUCOSEU Negative 03/07/2014 0917   HGBUR NEGATIVE 05/17/2023 1605   BILIRUBINUR NEGATIVE 05/17/2023 1605   BILIRUBINUR Negative 04/15/2015 1649   BILIRUBINUR Negative 03/07/2014 0917   KETONESUR NEGATIVE 05/17/2023 1605   PROTEINUR TRACE (A) 05/17/2023 1605   NITRITE NEGATIVE 05/17/2023 1605   LEUKOCYTESUR NEGATIVE 05/17/2023 1605   LEUKOCYTESUR Negative 03/07/2014 0917    Results for orders placed or performed during the hospital encounter of 05/17/23  Resp panel by RT-PCR (RSV, Flu A&B, Covid) Urine, Clean Catch     Status: None   Collection Time: 05/17/23  5:07 PM   Specimen: Urine, Clean Catch; Nasal Swab  Result Value Ref Range Status   SARS Coronavirus 2 by RT PCR NEGATIVE NEGATIVE Final         Influenza A by PCR NEGATIVE NEGATIVE  Final   Influenza B by PCR NEGATIVE NEGATIVE Final         Resp Syncytial Virus by PCR NEGATIVE NEGATIVE Final           __________________________________________________________ Recent Labs  Lab 05/17/23 1602  NA 137  K 3.7  CO2 23  GLUCOSE 253*  BUN 17  CREATININE 0.87  CALCIUM 9.1    Cr   stable,    Lab Results  Component Value Date   CREATININE 0.87 05/17/2023   CREATININE 1.06 (H) 05/10/2023   CREATININE 1.16 (H) 03/26/2023    Recent Labs  Lab 05/17/23 1602  AST 14*  ALT 14  ALKPHOS 96  BILITOT 0.7  PROT 6.5  ALBUMIN 4.1   Lab Results  Component Value Date   CALCIUM 9.1 05/17/2023    Plt: Lab Results  Component Value Date   PLT 383 05/17/2023    Recent Labs  Lab 05/17/23 1602  WBC 13.3*  HGB 13.6  HCT 39.3  MCV 88.7  PLT 383    HG/HCT stable,     Component Value Date/Time   HGB 13.6 05/17/2023 1602   HGB  11.9 (L) 03/08/2014 0337   HCT 39.3 05/17/2023 1602   HCT 34.9 (L) 03/08/2014 0337   MCV 88.7 05/17/2023 1602   MCV 99 03/08/2014 0337     Recent Labs  Lab 05/17/23 1602  LIPASE 13      _______________________________________________ Hospitalist was called for admission for COPD, Atypical chest pain  Acute respiratory failure with hypoxia,  Nausea and vomiting, unspecified vomiting type new diagnosis of DM2    The following Work up has been ordered so far:  Orders Placed This Encounter  Procedures   Blood Culture (routine x 2)   Resp panel by RT-PCR (RSV, Flu A&B, Covid) Anterior Nasal Swab   DG Chest 2 View   CT ABDOMEN PELVIS W CONTRAST   CT Angio Chest PE W and/or Wo Contrast   CBC   Comprehensive metabolic panel   Lipase, blood   Urinalysis, Routine w reflex microscopic -Urine, Clean Catch   Brain natriuretic peptide   Protime-INR   APTT   Ethanol   Rapid urine drug screen (hospital performed)   Diet NPO time specified   ED Cardiac monitoring   Document height and weight   Assess and Document Glasgow Coma  Scale   Document vital signs within 1-hour of fluid bolus completion.  Notify provider of abnormal vital signs despite fluid resuscitation.   Refer to Sidebar Report: Sepsis Bundle ED/IP   Notify provider for difficulties obtaining IV access   Initiate Carrier Fluid Protocol   Cardiac Monitoring Continuous x 24 hours Indications for use: Other; Other indications for use: Close monitoring   Consult to hospitalist   Oxygen therapy Mode or (Route): Nasal cannula; Keep O2 saturation between: 90% or greater   EKG 12-Lead   ED EKG   EKG   EKG   Insert peripheral IV   Place in observation (patient's expected length of stay will be less than 2 midnights)     OTHER Significant initial  Findings:  labs showing:     DM  labs:  HbA1C: No results for input(s): "HGBA1C" in the last 8760 hours.     CBG (last 3)  No results for input(s): "GLUCAP" in the last 72 hours.        Cultures:    Component Value Date/Time   SDES BLOOD BLOOD LEFT HAND 05/10/2023 2155   SPECREQUEST  05/10/2023 2155    BOTTLES DRAWN AEROBIC AND ANAEROBIC Blood Culture adequate volume   CULT  05/10/2023 2155    NO GROWTH 5 DAYS Performed at [facility], [address], [place], [place] [address]    REPTSTATUS 05/15/2023 FINAL 05/10/2023 2155     Radiological Exams on Admission: CT ABDOMEN PELVIS W CONTRAST Result Date: 05/17/2023 CLINICAL DATA:  Left lower quadrant pain, nausea, vomiting EXAM: CT ABDOMEN AND PELVIS WITH CONTRAST TECHNIQUE: Multidetector CT imaging of the abdomen and pelvis was performed using the standard protocol following bolus administration of intravenous contrast. RADIATION DOSE REDUCTION: This exam was performed according to the departmental dose-optimization program which includes automated exposure control, adjustment of the mA and/or kV according to patient size and/or use of iterative reconstruction technique. CONTRAST:  OMNIPAQUE IOHEXOL 350 MG/ML SOLN COMPARISON:  None  Available. FINDINGS: Lower chest: No acute findings. Hepatobiliary: No focal hepatic abnormality. Gallbladder unremarkable. Pancreas: No focal abnormality or ductal dilatation. Spleen: No focal abnormality.  Normal size. Adrenals/Urinary Tract: No adrenal abnormality. No focal renal abnormality. No stones or hydronephrosis. Urinary bladder is unremarkable. Stomach/Bowel: Normal appendix. Stomach, large and  small bowel grossly unremarkable. Vascular/Lymphatic: No evidence of aneurysm or adenopathy. Reproductive: Prior hysterectomy.  No adnexal masses. Other: No free fluid or free air. Musculoskeletal: No acute bony abnormality. IMPRESSION: No acute findings in the abdomen or pelvis. Electronically Signed   By: Charlett Nose M.D.   On: 05/17/2023 18:14   CT Angio Chest PE W and/or Wo Contrast Result Date: 05/17/2023 CLINICAL DATA:  Pulmonary embolism (PE) suspected, high prob. Shortness of breath. EXAM: CT ANGIOGRAPHY CHEST WITH CONTRAST TECHNIQUE: Multidetector CT imaging of the chest was performed using the standard protocol during bolus administration of intravenous contrast. Multiplanar CT image reconstructions and MIPs were obtained to evaluate the vascular anatomy. RADIATION DOSE REDUCTION: This exam was performed according to the departmental dose-optimization program which includes automated exposure control, adjustment of the mA and/or kV according to patient size and/or use of iterative reconstruction technique. CONTRAST:  OMNIPAQUE IOHEXOL 350 MG/ML SOLN COMPARISON:  01/23/2015.  Chest x-ray today. FINDINGS: Cardiovascular: No filling defects in the pulmonary arteries to suggest pulmonary emboli. Heart is normal size. Aorta is normal caliber. Mediastinum/Nodes: No mediastinal, hilar, or axillary adenopathy. Trachea and esophagus are unremarkable. Thyroid unremarkable. Lungs/Pleura: Moderate centrilobular and paraseptal emphysema. No confluent opacities or effusions. Upper Abdomen: No acute findings  Musculoskeletal: Chest wall soft tissues are unremarkable. No acute bony abnormality. Review of the MIP images confirms the above findings. IMPRESSION: No evidence of pulmonary embolus. No acute cardiopulmonary disease. Emphysema (ICD10-J43.9). Electronically Signed   By: Charlett Nose M.D.   On: 05/17/2023 18:13   DG Chest 2 View Result Date: 05/17/2023 CLINICAL DATA:  Shortness of breath EXAM: CHEST - 2 VIEW COMPARISON:  05/10/2023 FINDINGS: Emphysematous/chronic changes within the lungs. No acute confluent opacities or effusions. Heart and mediastinal contours within normal limits. No acute bony abnormality. IMPRESSION: COPD/chronic changes. No active disease. Electronically Signed   By: Charlett Nose M.D.   On: 05/17/2023 17:20   _______________________________________________________________________________________________________ Latest  Blood pressure (!) 139/94, pulse 89, temperature 98.1 F (36.7 C), temperature source Oral, resp. rate 20, height 5\' 6"  (1.676 m), weight 77.1 kg, SpO2 97%.   Vitals  labs and radiology finding personally reviewed  Review of Systems:    Pertinent positives include:   chest pain,abdominal pain, nausea, vomiting, diarrhea,   Constitutional:  No weight loss, night sweats, Fevers, chills, fatigue, weight loss  HEENT:  No headaches, Difficulty swallowing,Tooth/dental problems,Sore throat,  No sneezing, itching, ear ache, nasal congestion, post nasal drip,  Cardio-vascular:  No Orthopnea, PND, anasarca, dizziness, palpitations.no Bilateral lower extremity swelling  GI:  No heartburn, indigestion, change in bowel habits, loss of appetite, melena, blood in stool, hematemesis Resp:  no shortness of breath at rest. No dyspnea on exertion, No excess mucus, no productive cough, No non-productive cough, No coughing up of blood.No change in color of mucus.No wheezing. Skin:  no rash or lesions. No jaundice GU:  no dysuria, change in color of urine, no urgency or  frequency. No straining to urinate.  No flank pain.  Musculoskeletal:  No joint pain or no joint swelling. No decreased range of motion. No back pain.  Psych:  No change in mood or affect. No depression or anxiety. No memory loss.  Neuro: no localizing neurological complaints, no tingling, no weakness, no double vision, no gait abnormality, no slurred speech, no confusion  All systems reviewed and apart from HOPI all are negative _______________________________________________________________________________________________ Past Medical History:   Past Medical History:  Diagnosis Date   Anxiety  Asthma    Bulging lumbar disc    Bulging lumbar disc    COPD (chronic obstructive pulmonary disease) (HCC)    GERD (gastroesophageal reflux disease)    Homelessness 11/09/2022   Insomnia    Kidney stone    Pneumonia 03/2015   Sciatic leg pain    Right side   Sciatica of right side    Weakness    Bil knees, and ankles     Past Surgical History:  Procedure Laterality Date   ABDOMINAL HYSTERECTOMY     Partial   ESOPHAGOGASTRODUODENOSCOPY (EGD) WITH PROPOFOL N/A 11/27/2015   Procedure: ESOPHAGOGASTRODUODENOSCOPY (EGD) WITH PROPOFOL;  Surgeon: Christena Deem, MD;  Location: [facility] ENDOSCOPY;  Service: Endoscopy;  Laterality: N/A;   SHOULDER SURGERY Right    TONSILLECTOMY     TUBAL LIGATION      Social History:  Ambulatory   cane, walker     reports that she has been smoking cigarettes. She has a 40 pack-year smoking history. She uses smokeless tobacco. She reports current alcohol use. She reports current drug use. Drug: Marijuana.     Family History:   Family History  Problem Relation Age of Onset   Fibromyalgia Mother    ______________________________________________________________________________________________ Allergies: Allergies  Allergen Reactions   Haloperidol     Made pt "feel really weird".  Other Reaction(s): Unknown   Hydrocodone-Acetaminophen Nausea And  Vomiting and Nausea Only    Other Reaction(s): Vomiting   Morphine Nausea And Vomiting    Other Reaction(s): Unknown  Patient states this medication makes her have sever flu symptoms.   Morphine And Codeine Nausea And Vomiting    Patient states this medication makes her have sever flu symptoms.   Pravastatin     Other Reaction(s): Other (See Comments)  CHEST PAIN   Pravastatin Sodium     Other Reaction(s): Other (See Comments)   Promethazine Hcl     Other Reaction(s): Unknown   Fentanyl Nausea And Vomiting and Other (See Comments)    Patient states this makes her have severe flu symptoms.   Propofol Nausea And Vomiting    Other Reaction(s): Unknown    Prior to Admission medications   Medication Sig Start Date End Date Taking? Authorizing Provider  acetaminophen (TYLENOL) 500 MG tablet Take 500 mg by mouth every 6 (six) hours as needed. Patient not taking: Reported on 12/26/2021    [provider]  albuterol (PROVENTIL HFA;VENTOLIN HFA) 108 (90 Base) MCG/ACT inhaler Inhale 2 puffs into the lungs every 6 (six) hours as needed for wheezing or shortness of breath. 01/16/16   Jennye Moccasin, MD  aluminum-magnesium hydroxide-simethicone (MAALOX) 200-200-20 MG/5ML SUSP Take 30 mLs by mouth 4 (four) times daily -  before meals and at bedtime. Patient not taking: Reported on 08/03/2022 02/28/19   Sharman Cheek, MD  atorvastatin (LIPITOR) 10 MG tablet Take 10 mg by mouth at bedtime. 08/12/22   [provider]  buPROPion (WELLBUTRIN SR) 150 MG 12 hr tablet Take 150 mg by mouth daily. 11/03/22   [provider]  clonazePAM (KLONOPIN) 0.5 MG tablet Take 0.5 mg by mouth 2 (two) times daily as needed for anxiety.    [provider]  clonazePAM (KLONOPIN) 1 MG tablet Take 0.5 mg by mouth 2 (two) times daily as needed for anxiety. 10/15/22   [provider]  dexlansoprazole (DEXILANT) 60 MG capsule Take 1 capsule by mouth daily. Patient not taking: Reported on  12/26/2021    [provider]  dicyclomine (BENTYL)  20 MG tablet Take 1 tablet (20 mg total) by mouth 2 (two) times daily. 01/16/23   Linwood Dibbles, MD  DULoxetine 40 MG CPEP Take 40 mg by mouth daily. Patient not taking: Reported on 12/26/2021 07/27/16   Pucilowska, Braulio Conte B, MD  famotidine (PEPCID) 20 MG tablet Take 1 tablet (20 mg total) by mouth 2 (two) times daily. 02/28/19   Sharman Cheek, MD  fexofenadine (ALLEGRA) 180 MG tablet Take 180 mg by mouth daily.    [provider]  FLUoxetine (PROZAC) 20 MG capsule Take 20 mg by mouth daily. 07/16/22   [provider]  FLUoxetine (PROZAC) 40 MG capsule Take 40 mg by mouth daily. Patient not taking: Reported on 11/04/2022    [provider]  fluticasone (FLONASE) 50 MCG/ACT nasal spray Place 2 sprays into both nostrils daily.     [provider]  gabapentin (NEURONTIN) 300 MG capsule Take 1 capsule (300 mg total) by mouth 3 (three) times daily. 03/28/23   Horton, Mayer Masker, MD  hydrOXYzine (ATARAX) 25 MG tablet Take 25 mg by mouth 4 (four) times daily. 07/30/22   [provider]  hydrOXYzine (ATARAX/VISTARIL) 50 MG tablet Take 50 mg by mouth every 6 (six) hours as needed.     [provider]  hydrOXYzine (VISTARIL) 50 MG capsule Take 100 mg by mouth every 8 (eight) hours as needed. 08/12/22   [provider]  lidocaine (XYLOCAINE) 2 % solution Use as directed 15 mLs in the mouth or throat as needed for mouth pain. Patient not taking: Reported on 08/03/2022 04/13/19   Chesley Noon, MD  lidocaine (XYLOCAINE) 5 % ointment Apply 1 Application topically daily as needed. 08/17/22   [provider]  MAGNESIUM PO Take 1 tablet by mouth daily.    [provider]  methocarbamol (ROBAXIN) 500 MG tablet Take 1 tablet (500 mg total) by mouth every 8 (eight) hours as needed for muscle spasms. 03/26/23   Gilda Crease, MD  methylPREDNISolone (MEDROL DOSEPAK) 4 MG TBPK tablet  Take as directed per package instructions 04/09/23   Darrick Grinder, PA-C  metoCLOPramide (REGLAN) 10 MG tablet Take 1 tablet (10 mg total) by mouth every 6 (six) hours as needed. Patient not taking: Reported on 12/26/2021 02/28/19   Sharman Cheek, MD  metoCLOPramide (REGLAN) 10 MG tablet Take 1 tablet (10 mg total) by mouth every 8 (eight) hours as needed for refractory nausea / vomiting. Patient not taking: Reported on 12/26/2021 04/18/19   Felicie Morn, NP  ondansetron (ZOFRAN) 4 MG tablet Take 1 tablet (4 mg total) by mouth every 8 (eight) hours as needed for nausea or vomiting. 10/13/22   Curatolo, Adam, DO  ondansetron (ZOFRAN-ODT) 8 MG disintegrating tablet Take 1 tablet (8 mg total) by mouth every 8 (eight) hours as needed for nausea or vomiting. 01/16/23   Linwood Dibbles, MD  pantoprazole (PROTONIX) 20 MG tablet Take 1 tablet (20 mg total) by mouth daily. 01/16/23   Linwood Dibbles, MD  predniSONE (DELTASONE) 20 MG tablet Take 2 tablets (40 mg total) by mouth daily with breakfast. 11/04/22   Bing Neighbors, NP  predniSONE (DELTASONE) 20 MG tablet Take 2 tablets (40 mg total) by mouth daily. 11/09/22   White, Elita Boone, NP  rOPINIRole (REQUIP) 2 MG tablet Take 2 mg by mouth 2 (two) times daily. 08/17/22   [provider]  sucralfate (CARAFATE) 1 g tablet Take 1 g by mouth 4 (four) times daily. 08/17/22   [provider]  zolpidem (AMBIEN) 10 MG tablet Take 10 mg by mouth at bedtime as needed for sleep. Patient not taking: Reported on 11/04/2022    [provider]    ___________________________________________________________________________________________________ Physical Exam:    05/17/2023   11:29 PM 05/17/2023   10:57 PM 05/17/2023    9:45 PM  Vitals with BMI  Height 5\' 6"     Weight 170 lbs    BMI 27.45    Systolic 139 139 161  Diastolic 94 94 73  Pulse 89 89 90    1. General:  in No  Acute distress   Chronically ill-appearing 2. Psychological: Alert and    Oriented 3. Head/ENT:    Dry Mucous Membranes                          Head Non traumatic, neck supple                           Poor Dentition 4. SKIN:  decreased Skin turgor,  Skin clean Dry and intact no rash    5. Heart: Regular rate and rhythm no  Murmur, no Rub or gallop 6. Lungs , some wheezes or crackles   7. Abdomen: Soft,  non-tender, Non distended bowel sounds present 8. Lower extremities: no clubbing, cyanosis, no  edema 9. Neurologically Grossly intact, moving all 4 extremities equally   10. MSK: Normal range of motion  Left chest wall tenderness reproducible by palpation  Chart has been reviewed  ______________________________________________________________________________________________  Assessment/Plan  58 y.o. female with medical history significant of COPD, HLD, HTN, DM2, IBS  Admitted for  Atypical chest pain acute respiratory failure with hypoxia nausea vomiting diarrhea and new diabetes mellitus diagnosis    Present on Admission:  Acute hypoxemic respiratory failure (HCC)  Adjustment disorder with mixed anxiety and depressed mood  Cannabis use disorder, moderate, dependence (HCC)  COPD exacerbation (HCC)  Dyslipidemia  GERD (gastroesophageal reflux disease)  Tobacco use disorder  Sedative, hypnotic or anxiolytic use disorder, severe, dependence (HCC)  Chest pain   Acute hypoxemic respiratory failure (HCC)  this patient has acute respiratory failure with Hypoxia  as documented by the presence of following: O2 saturatio< 90% on RA   Likely due to:   COPD exacerbation,  Provide O2 therapy and titrate as needed  Continuous pulse ox  check Pulse ox with ambulation prior to discharge  may need  TC consult for home O2 set up    flutter valve ordered   Adjustment disorder with mixed anxiety and depressed mood Continue Klonopin as needed and Atarax  Cannabis use disorder, moderate, dependence (HCC) Chronic stable may be contributing to nausea vomiting  would recommend patient stops  COPD exacerbation (HCC)  -  - Will initiate: Steroid taper  -  Antibiotics   Doxycycline, - Albuterol  PRN, - scheduled duoneb,  -  Breo or Dulera at discharge   -  Mucinex.  Titrate O2 to saturation >90%. Follow patients respiratory status.   influenza PCR neg  VBG ordered   Currently mentating well no evidence of symptomatic hypercarbia   Dyslipidemia Chronic continue Lipitor 10 mg a day  GERD (gastroesophageal reflux disease) Continue Pepcid and Carafate  Tobacco use disorder  - Spoke about importance of quitting spent 5 minutes discussing options for treatment, prior attempts at quitting, and dangers of smoking  -At this point patient is    interested in quitting  -  order nicotine patch   - nursing tobacco cessation protocol   Sedative, hypnotic or anxiolytic use disorder, severe, dependence (HCC) You will need follow-up with primary care provider to see if benzodiazepines can be weaned off  DM2 (diabetes mellitus, type 2) (HCC) New onset of diabetes mellitus Check TSH Order sliding scale Diabetes coordinator consult  Chest pain CP reproducible by palpation Somewhat atypical troponins unremarkable patient has risk factors such as new diagnosis of diabetes and tobacco abuse and hyperlipidemia. Order echogram in the morning if unremarkable would recommend outpatient follow-up with cardiology   Other plan as per orders.  DVT prophylaxis:  SCD      Code Status:   DNR/DNI   as per patient   I had personally discussed CODE STATUS with patient and family  ACP   none    Family Communication:   Family not at  Bedside    Diet  Diet Orders (From admission, onward)     Start     Ordered   05/17/23 1645  Diet NPO time specified  (Undifferentiated presentation (screening labs and basic nursing orders))  Diet effective now        05/17/23 1646            Disposition Plan:      To home once workup is complete and patient is stable    Following barriers for discharge:                             Chest pain   work up is complete                                                      Will likely need home health, home O2, set up                             Consult Orders  (From admission, onward)           Start     Ordered   05/17/23 2000  Consult to hospitalist  Paged by Nehemiah Settle  Once       Provider:  (Not yet assigned)  Question Answer Comment  Place call to: [facility]   Reason for Consult Admit      05/17/23 1959                               Would benefit from PT/OT eval prior to DC  Ordered                  Diabetes care coordinator Consults called: none     Admission status:  ED Disposition     ED Disposition  Admit   Condition  --   Comment  Hospital Area: [facility] [[id number]]  Level of Care: Telemetry [5]  Admit to tele based on following criteria: Complex arrhythmia (Bradycardia/Tachycardia)  Interfacility transfer: Yes  May place patient in observation at [facility] or [facility] if equivalent level of care is available:: Yes  Covid Evaluation: Asymptomatic - no recent exposure (last 10 days) testing not required  Diagnosis: Acute hypoxemic respiratory failure [facility]) [[id number]]  Admitting Physician: Arlean Hopping [[id number]]  Attending Physician: Derwood Kaplan [phone]          Obs     Level of care     tele  For  24H      Lab Results  Component Value Date   SARSCOV2NAA NEGATIVE 05/17/2023     Precautions: admitted as  Covid Negative     Myriam Brandhorst 05/18/2023, 1:11 AM    [facility]     after 2 AM please page floor coverage PA If 7AM-7PM, please contact the day team taking care of the patient using [email or web address]

## 2023-05-17 NOTE — Plan of Care (Signed)

## 2023-05-17 NOTE — Subjective & Objective (Addendum)
Patient comes in if chest pain and shortness of breath and vomiting and diarrhea.  She was diagnosed with pneumonia on 6 January was admitted and discharged on amoxicillin 500 mg p.o.  daily thereafter she started to have more but diarrhea she has not improved with her respiratory symptoms either. 2 days ago she started to have more of nausea vomiting diarrhea symptoms in addition which chest pain shortness of breath cough is persistent of yellow sputum Chest pain with radiation to the left side worsening shortness of breath with exertion abdominal pain and left upper and lower quadrants No blood in stools No prior history of CAD or blood clots but does has history of COPD

## 2023-05-17 NOTE — H&P (Incomplete)
Shawna [name] ZOX:[id number] DOB: 01/08/1966 DOA: 05/17/2023     PCP: System, Provider Not In      Patient arrived to ER on 05/17/23 at 1529 Referred by Attending No att. providers found   Patient coming from:    home Lives With family      Chief Complaint:   Chief Complaint  Patient presents with  . Pneumonia    HPI: Shawna [name] is a 58 y.o. female with medical history significant of COPD, HLD, HTN, DM2, IBS    Presented with    shortness of breath chest pain Patient comes in if chest pain and shortness of breath and vomiting and diarrhea.  She was diagnosed with pneumonia on 6 January was admitted and discharged on amoxicillin 500 mg p.o.  daily thereafter she started to have more but diarrhea she has not improved with her respiratory symptoms either. 2 days ago she started to have more of nausea vomiting diarrhea symptoms in addition which chest pain shortness of breath cough is persistent of yellow sputum Chest pain with radiation to the left side worsening shortness of breath with exertion abdominal pain and left upper and lower quadrants No blood in stools No prior history of CAD or blood clots but does has history of COPD    Denies significant ETOH intake   Does smoke  but interested in quitting  1/2 pack a day  Lab Results  Component Value Date   SARSCOV2NAA NEGATIVE 05/17/2023   SARSCOV2NAA NEGATIVE 01/03/2021    Regarding pertinent Chronic problems:    Hyperlipidemia - on statins Lipitor (atorvastatin)  Lipid Panel     Component Value Date/Time   CHOL 242 (H) 07/25/2016 1336   CHOL 160 10/28/2011 0457   TRIG 716 (H) 07/25/2016 1336   TRIG 344 (H) 10/28/2011 0457   HDL 36 (L) 07/25/2016 1336   HDL 23 (L) 10/28/2011 0457   CHOLHDL 6.7 07/25/2016 1336   VLDL UNABLE TO CALCULATE IF TRIGLYCERIDE OVER 400 mg/dL 81/19/1478 2956   VLDL 69 (H) 10/28/2011 0457   LDLCALC UNABLE TO CALCULATE IF TRIGLYCERIDE OVER 400 mg/dL 21/30/8657 8469   LDLCALC 68 10/28/2011  0457     HTN on       DM 2 -  Lab Results  Component Value Date   HGBA1C 5.4 07/25/2016   diet controlled     COPD - not  followed by pulmonology   not  on baseline oxygen          While in ER:     Oxygen saturation in the upper 80s with ambulation, improved with 2 L [place].  Troponin negative x 2.  UDS positive for benzos and THC.  COVID/flu/RSV negative.  Lactate normal.  BNP normal.  UA negative.  Lipase and LFTs normal.  WBC count 13.3.  CBG in the 200s, ho hx of DM. CTA chest negative for PE or pneumonia.  CT abdomen pelvis negative.  Patient was given Maalox, Bentyl, DuoNeb, albuterol, Solu-Medrol 125 mg, Zofran, and oxycodone.     Lab Orders         Blood Culture (routine x 2)         Resp panel by RT-PCR (RSV, Flu A&B, Covid) Anterior Nasal Swab         CBC         Comprehensive metabolic panel         Lipase, blood         Urinalysis, Routine w reflex microscopic -  Urine, Clean Catch         Brain natriuretic peptide         Protime-INR         APTT         Ethanol         Rapid urine drug screen (hospital performed)        CXR - COPD/chronic changes.   CTabd/pelvis -  non acute  CTA chest -  non acute, no PE,  no evidence of infiltrate  Following Medications were ordered in ER: Medications  albuterol (VENTOLIN HFA) 108 (90 Base) MCG/ACT inhaler 2 puff (2 puffs Inhalation Given 05/17/23 1557)  alum & mag hydroxide-simeth (MAALOX/MYLANTA) 200-200-20 MG/5ML suspension 30 mL (30 mLs Oral Given 05/17/23 1626)  dicyclomine (BENTYL) capsule 10 mg (10 mg Oral Given 05/17/23 1626)  oxyCODONE (Oxy IR/ROXICODONE) immediate release tablet 5 mg (5 mg Oral Given 05/17/23 1626)  ondansetron (ZOFRAN) injection 4 mg (4 mg Intravenous Given 05/17/23 1626)  iohexol (OMNIPAQUE) 350 MG/ML injection 100 mL (100 mLs Intravenous Contrast Given 05/17/23 1748)  ipratropium-albuterol (DUONEB) 0.5-2.5 (3) MG/3ML nebulizer solution 3 mL (3 mLs Nebulization Given 05/17/23 1925)  methylPREDNISolone  sodium succinate (SOLU-MEDROL) 125 mg/2 mL injection 125 mg (125 mg Intravenous Given 05/17/23 1913)     ED Triage Vitals  Encounter Vitals Group     BP 05/17/23 1537 129/79     Systolic BP Percentile --      Diastolic BP Percentile --      Pulse Rate 05/17/23 1537 (!) 124     Resp 05/17/23 1537 20     Temp 05/17/23 1537 98 F (36.7 C)     Temp Source 05/17/23 2143 Oral     SpO2 05/17/23 1537 97 %     Weight 05/17/23 2329 170 lb (77.1 kg)     Height 05/17/23 2329 5\' 6"  (1.676 m)     Head Circumference --      Peak Flow --      Pain Score 05/17/23 1537 8     Pain Loc --      Pain Education --      Exclude from Growth Chart --   ZOXW(96)@     _________________________________________ Significant initial  Findings: Abnormal Labs Reviewed  CBC - Abnormal; Notable for the following components:      Result Value   WBC 13.3 (*)    All other components within normal limits  COMPREHENSIVE METABOLIC PANEL - Abnormal; Notable for the following components:   Glucose, Bld 253 (*)    AST 14 (*)    All other components within normal limits  URINALYSIS, ROUTINE W REFLEX MICROSCOPIC - Abnormal; Notable for the following components:   Glucose, UA 500 (*)    Protein, ur TRACE (*)    All other components within normal limits  RAPID URINE DRUG SCREEN, HOSP PERFORMED - Abnormal; Notable for the following components:   Benzodiazepines POSITIVE (*)    Tetrahydrocannabinol POSITIVE (*)    All other components within normal limits      _________________________ Troponin  ordered Cardiac Panel (last 3 results) Recent Labs    05/17/23 1602 05/17/23 1825  TROPONINIHS 3 3     ECG: Ordered Personally reviewed and interpreted by me showing: HR : *** Rhythm: *NSR, Sinus tachycardia * A.fib. W RVR, RBBB, LBBB, Paced Ischemic changes*nonspecific changes, no evidence of ischemic changes QTC*  BNP (last 3 results) Recent Labs    05/17/23 1602  BNP 35.3  COVID-19 Labs  No results  for input(s): "DDIMER", "FERRITIN", "LDH", "CRP" in the last 72 hours.  Lab Results  Component Value Date   SARSCOV2NAA NEGATIVE 05/17/2023   SARSCOV2NAA NEGATIVE 01/03/2021       The recent clinical data is shown below. Vitals:   05/17/23 2143 05/17/23 2145 05/17/23 2257 05/17/23 2329  BP:  108/73 (!) 139/94 (!) 139/94  Pulse:  90 89 89  Resp:  19 20 20   Temp: 98.5 F (36.9 C)  98.1 F (36.7 C) 98.1 F (36.7 C)  TempSrc: Oral  Oral Oral  SpO2:  96% 97%   Weight:    77.1 kg  Height:    5\' 6"  (1.676 m)    WBC     Component Value Date/Time   WBC 13.3 (H) 05/17/2023 1602   LYMPHSABS 2.8 05/10/2023 2158   LYMPHSABS 2.8 03/08/2014 0337   MONOABS 0.6 05/10/2023 2158   MONOABS 0.7 03/08/2014 0337   EOSABS 0.0 05/10/2023 2158   EOSABS 0.2 03/08/2014 0337   BASOSABS 0.1 05/10/2023 2158   BASOSABS 0.1 03/08/2014 0337    Lactic Acid, Venous    Component Value Date/Time   LATICACIDVEN 1.6 05/17/2023 1644       UA   no evidence of UTI     Urine analysis:    Component Value Date/Time   COLORURINE YELLOW 05/17/2023 1605   APPEARANCEUR CLEAR 05/17/2023 1605   APPEARANCEUR Clear 04/15/2015 1649   LABSPEC 1.020 05/17/2023 1605   LABSPEC 1.008 03/07/2014 0917   PHURINE 6.5 05/17/2023 1605   GLUCOSEU 500 (A) 05/17/2023 1605   GLUCOSEU Negative 03/07/2014 0917   HGBUR NEGATIVE 05/17/2023 1605   BILIRUBINUR NEGATIVE 05/17/2023 1605   BILIRUBINUR Negative 04/15/2015 1649   BILIRUBINUR Negative 03/07/2014 0917   KETONESUR NEGATIVE 05/17/2023 1605   PROTEINUR TRACE (A) 05/17/2023 1605   NITRITE NEGATIVE 05/17/2023 1605   LEUKOCYTESUR NEGATIVE 05/17/2023 1605   LEUKOCYTESUR Negative 03/07/2014 0917    Results for orders placed or performed during the hospital encounter of 05/17/23  Resp panel by RT-PCR (RSV, Flu A&B, Covid) Urine, Clean Catch     Status: None   Collection Time: 05/17/23  5:07 PM   Specimen: Urine, Clean Catch; Nasal Swab  Result Value Ref Range Status    SARS Coronavirus 2 by RT PCR NEGATIVE NEGATIVE Final         Influenza A by PCR NEGATIVE NEGATIVE Final   Influenza B by PCR NEGATIVE NEGATIVE Final         Resp Syncytial Virus by PCR NEGATIVE NEGATIVE Final           __________________________________________________________ Recent Labs  Lab 05/17/23 1602  NA 137  K 3.7  CO2 23  GLUCOSE 253*  BUN 17  CREATININE 0.87  CALCIUM 9.1    Cr   stable,    Lab Results  Component Value Date   CREATININE 0.87 05/17/2023   CREATININE 1.06 (H) 05/10/2023   CREATININE 1.16 (H) 03/26/2023    Recent Labs  Lab 05/17/23 1602  AST 14*  ALT 14  ALKPHOS 96  BILITOT 0.7  PROT 6.5  ALBUMIN 4.1   Lab Results  Component Value Date   CALCIUM 9.1 05/17/2023    Plt: Lab Results  Component Value Date   PLT 383 05/17/2023    Recent Labs  Lab 05/17/23 1602  WBC 13.3*  HGB 13.6  HCT 39.3  MCV 88.7  PLT 383    HG/HCT stable,  Component Value Date/Time   HGB 13.6 05/17/2023 1602   HGB 11.9 (L) 03/08/2014 0337   HCT 39.3 05/17/2023 1602   HCT 34.9 (L) 03/08/2014 0337   MCV 88.7 05/17/2023 1602   MCV 99 03/08/2014 0337     Recent Labs  Lab 05/17/23 1602  LIPASE 13      _______________________________________________ Hospitalist was called for admission for COPD, Atypical chest pain  Hypoxia ***  Left lower quadrant abdominal pain ***  Nausea and vomiting, unspecified vomiting type ***  Hyperglycemia    The following Work up has been ordered so far:  Orders Placed This Encounter  Procedures  . Blood Culture (routine x 2)  . Resp panel by RT-PCR (RSV, Flu A&B, Covid) Anterior Nasal Swab  . DG Chest 2 View  . CT ABDOMEN PELVIS W CONTRAST  . CT Angio Chest PE W and/or Wo Contrast  . CBC  . Comprehensive metabolic panel  . Lipase, blood  . Urinalysis, Routine w reflex microscopic -Urine, Clean Catch  . Brain natriuretic peptide  . Protime-INR  . APTT  . Ethanol  . Rapid urine drug screen (hospital  performed)  . Diet NPO time specified  . ED Cardiac monitoring  . Document height and weight  . Assess and Document Glasgow Coma Scale  . Document vital signs within 1-hour of fluid bolus completion.  Notify provider of abnormal vital signs despite fluid resuscitation.  . Refer to Sidebar Report: Sepsis Bundle ED/IP  . Notify provider for difficulties obtaining IV access  . Initiate Carrier Fluid Protocol  . Cardiac Monitoring Continuous x 24 hours Indications for use: Other; Other indications for use: Close monitoring  . Consult to hospitalist  . Oxygen therapy Mode or (Route): Nasal cannula; Keep O2 saturation between: 90% or greater  . EKG 12-Lead  . ED EKG  . EKG  . EKG  . Insert peripheral IV  . Place in observation (patient's expected length of stay will be less than 2 midnights)     OTHER Significant initial  Findings:  labs showing:     DM  labs:  HbA1C: No results for input(s): "HGBA1C" in the last 8760 hours.     CBG (last 3)  No results for input(s): "GLUCAP" in the last 72 hours.        Cultures:    Component Value Date/Time   SDES BLOOD BLOOD LEFT HAND 05/10/2023 2155   SPECREQUEST  05/10/2023 2155    BOTTLES DRAWN AEROBIC AND ANAEROBIC Blood Culture adequate volume   CULT  05/10/2023 2155    NO GROWTH 5 DAYS Performed at [facility], [address], [place], [place] [address]    REPTSTATUS 05/15/2023 FINAL 05/10/2023 2155     Radiological Exams on Admission: CT ABDOMEN PELVIS W CONTRAST Result Date: 05/17/2023 CLINICAL DATA:  Left lower quadrant pain, nausea, vomiting EXAM: CT ABDOMEN AND PELVIS WITH CONTRAST TECHNIQUE: Multidetector CT imaging of the abdomen and pelvis was performed using the standard protocol following bolus administration of intravenous contrast. RADIATION DOSE REDUCTION: This exam was performed according to the departmental dose-optimization program which includes automated exposure control, adjustment of the mA and/or kV  according to patient size and/or use of iterative reconstruction technique. CONTRAST:  OMNIPAQUE IOHEXOL 350 MG/ML SOLN COMPARISON:  None Available. FINDINGS: Lower chest: No acute findings. Hepatobiliary: No focal hepatic abnormality. Gallbladder unremarkable. Pancreas: No focal abnormality or ductal dilatation. Spleen: No focal abnormality.  Normal size. Adrenals/Urinary Tract: No adrenal abnormality. No focal renal  abnormality. No stones or hydronephrosis. Urinary bladder is unremarkable. Stomach/Bowel: Normal appendix. Stomach, large and small bowel grossly unremarkable. Vascular/Lymphatic: No evidence of aneurysm or adenopathy. Reproductive: Prior hysterectomy.  No adnexal masses. Other: No free fluid or free air. Musculoskeletal: No acute bony abnormality. IMPRESSION: No acute findings in the abdomen or pelvis. Electronically Signed   By: Charlett Nose M.D.   On: 05/17/2023 18:14   CT Angio Chest PE W and/or Wo Contrast Result Date: 05/17/2023 CLINICAL DATA:  Pulmonary embolism (PE) suspected, high prob. Shortness of breath. EXAM: CT ANGIOGRAPHY CHEST WITH CONTRAST TECHNIQUE: Multidetector CT imaging of the chest was performed using the standard protocol during bolus administration of intravenous contrast. Multiplanar CT image reconstructions and MIPs were obtained to evaluate the vascular anatomy. RADIATION DOSE REDUCTION: This exam was performed according to the departmental dose-optimization program which includes automated exposure control, adjustment of the mA and/or kV according to patient size and/or use of iterative reconstruction technique. CONTRAST:  OMNIPAQUE IOHEXOL 350 MG/ML SOLN COMPARISON:  01/23/2015.  Chest x-ray today. FINDINGS: Cardiovascular: No filling defects in the pulmonary arteries to suggest pulmonary emboli. Heart is normal size. Aorta is normal caliber. Mediastinum/Nodes: No mediastinal, hilar, or axillary adenopathy. Trachea and esophagus are unremarkable. Thyroid  unremarkable. Lungs/Pleura: Moderate centrilobular and paraseptal emphysema. No confluent opacities or effusions. Upper Abdomen: No acute findings Musculoskeletal: Chest wall soft tissues are unremarkable. No acute bony abnormality. Review of the MIP images confirms the above findings. IMPRESSION: No evidence of pulmonary embolus. No acute cardiopulmonary disease. Emphysema (ICD10-J43.9). Electronically Signed   By: Charlett Nose M.D.   On: 05/17/2023 18:13   DG Chest 2 View Result Date: 05/17/2023 CLINICAL DATA:  Shortness of breath EXAM: CHEST - 2 VIEW COMPARISON:  05/10/2023 FINDINGS: Emphysematous/chronic changes within the lungs. No acute confluent opacities or effusions. Heart and mediastinal contours within normal limits. No acute bony abnormality. IMPRESSION: COPD/chronic changes. No active disease. Electronically Signed   By: Charlett Nose M.D.   On: 05/17/2023 17:20   _______________________________________________________________________________________________________ Latest  Blood pressure (!) 139/94, pulse 89, temperature 98.1 F (36.7 C), temperature source Oral, resp. rate 20, height 5\' 6"  (1.676 m), weight 77.1 kg, SpO2 97%.   Vitals  labs and radiology finding personally reviewed  Review of Systems:    Pertinent positives include:   chest pain,abdominal pain, nausea, vomiting, diarrhea,   Constitutional:  No weight loss, night sweats, Fevers, chills, fatigue, weight loss  HEENT:  No headaches, Difficulty swallowing,Tooth/dental problems,Sore throat,  No sneezing, itching, ear ache, nasal congestion, post nasal drip,  Cardio-vascular:  No Orthopnea, PND, anasarca, dizziness, palpitations.no Bilateral lower extremity swelling  GI:  No heartburn, indigestion, change in bowel habits, loss of appetite, melena, blood in stool, hematemesis Resp:  no shortness of breath at rest. No dyspnea on exertion, No excess mucus, no productive cough, No non-productive cough, No coughing up of  blood.No change in color of mucus.No wheezing. Skin:  no rash or lesions. No jaundice GU:  no dysuria, change in color of urine, no urgency or frequency. No straining to urinate.  No flank pain.  Musculoskeletal:  No joint pain or no joint swelling. No decreased range of motion. No back pain.  Psych:  No change in mood or affect. No depression or anxiety. No memory loss.  Neuro: no localizing neurological complaints, no tingling, no weakness, no double vision, no gait abnormality, no slurred speech, no confusion  All systems reviewed and apart from HOPI all are negative _______________________________________________________________________________________________ Past Medical  History:   Past Medical History:  Diagnosis Date  . Anxiety   . Asthma   . Bulging lumbar disc   . Bulging lumbar disc   . COPD (chronic obstructive pulmonary disease) (HCC)   . GERD (gastroesophageal reflux disease)   . Homelessness 11/09/2022  . Insomnia   . Kidney stone   . Pneumonia 03/2015  . Sciatic leg pain    Right side  . Sciatica of right side   . Weakness    Bil knees, and ankles     Past Surgical History:  Procedure Laterality Date  . ABDOMINAL HYSTERECTOMY     Partial  . ESOPHAGOGASTRODUODENOSCOPY (EGD) WITH PROPOFOL N/A 11/27/2015   Procedure: ESOPHAGOGASTRODUODENOSCOPY (EGD) WITH PROPOFOL;  Surgeon: Christena Deem, MD;  Location: [facility] ENDOSCOPY;  Service: Endoscopy;  Laterality: N/A;  . SHOULDER SURGERY Right   . TONSILLECTOMY    . TUBAL LIGATION      Social History:  Ambulatory   cane, walker     reports that she has been smoking cigarettes. She has a 40 pack-year smoking history. She uses smokeless tobacco. She reports current alcohol use. She reports current drug use. Drug: Marijuana.     Family History:   Family History  Problem Relation Age of Onset  . Fibromyalgia Mother     ______________________________________________________________________________________________ Allergies: Allergies  Allergen Reactions  . Haloperidol     Made pt "feel really weird".  Other Reaction(s): Unknown  . Hydrocodone-Acetaminophen Nausea And Vomiting and Nausea Only    Other Reaction(s): Vomiting  . Morphine Nausea And Vomiting    Other Reaction(s): Unknown  Patient states this medication makes her have sever flu symptoms.  . Morphine And Codeine Nausea And Vomiting    Patient states this medication makes her have sever flu symptoms.  . Pravastatin     Other Reaction(s): Other (See Comments)  CHEST PAIN  . Pravastatin Sodium     Other Reaction(s): Other (See Comments)  . Promethazine Hcl     Other Reaction(s): Unknown  . Fentanyl Nausea And Vomiting and Other (See Comments)    Patient states this makes her have severe flu symptoms.  . Propofol Nausea And Vomiting    Other Reaction(s): Unknown    Prior to Admission medications   Medication Sig Start Date End Date Taking? Authorizing Provider  acetaminophen (TYLENOL) 500 MG tablet Take 500 mg by mouth every 6 (six) hours as needed. Patient not taking: Reported on 12/26/2021    [provider]  albuterol (PROVENTIL HFA;VENTOLIN HFA) 108 (90 Base) MCG/ACT inhaler Inhale 2 puffs into the lungs every 6 (six) hours as needed for wheezing or shortness of breath. 01/16/16   Jennye Moccasin, MD  aluminum-magnesium hydroxide-simethicone (MAALOX) 200-200-20 MG/5ML SUSP Take 30 mLs by mouth 4 (four) times daily -  before meals and at bedtime. Patient not taking: Reported on 08/03/2022 02/28/19   Sharman Cheek, MD  atorvastatin (LIPITOR) 10 MG tablet Take 10 mg by mouth at bedtime. 08/12/22   [provider]  buPROPion (WELLBUTRIN SR) 150 MG 12 hr tablet Take 150 mg by mouth daily. 11/03/22   [provider]  clonazePAM (KLONOPIN) 0.5 MG tablet Take 0.5 mg by mouth 2 (two) times daily as needed for  anxiety.    [provider]  clonazePAM (KLONOPIN) 1 MG tablet Take 0.5 mg by mouth 2 (two) times daily as needed for anxiety. 10/15/22   [provider]  dexlansoprazole (DEXILANT) 60 MG capsule Take 1 capsule by mouth daily.  Patient not taking: Reported on 12/26/2021    [provider]  dicyclomine (BENTYL) 20 MG tablet Take 1 tablet (20 mg total) by mouth 2 (two) times daily. 01/16/23   Linwood Dibbles, MD  DULoxetine 40 MG CPEP Take 40 mg by mouth daily. Patient not taking: Reported on 12/26/2021 07/27/16   Pucilowska, Braulio Conte B, MD  famotidine (PEPCID) 20 MG tablet Take 1 tablet (20 mg total) by mouth 2 (two) times daily. 02/28/19   Sharman Cheek, MD  fexofenadine (ALLEGRA) 180 MG tablet Take 180 mg by mouth daily.    [provider]  FLUoxetine (PROZAC) 20 MG capsule Take 20 mg by mouth daily. 07/16/22   [provider]  FLUoxetine (PROZAC) 40 MG capsule Take 40 mg by mouth daily. Patient not taking: Reported on 11/04/2022    [provider]  fluticasone (FLONASE) 50 MCG/ACT nasal spray Place 2 sprays into both nostrils daily.     [provider]  gabapentin (NEURONTIN) 300 MG capsule Take 1 capsule (300 mg total) by mouth 3 (three) times daily. 03/28/23   Horton, Mayer Masker, MD  hydrOXYzine (ATARAX) 25 MG tablet Take 25 mg by mouth 4 (four) times daily. 07/30/22   [provider]  hydrOXYzine (ATARAX/VISTARIL) 50 MG tablet Take 50 mg by mouth every 6 (six) hours as needed.     [provider]  hydrOXYzine (VISTARIL) 50 MG capsule Take 100 mg by mouth every 8 (eight) hours as needed. 08/12/22   [provider]  lidocaine (XYLOCAINE) 2 % solution Use as directed 15 mLs in the mouth or throat as needed for mouth pain. Patient not taking: Reported on 08/03/2022 04/13/19   Chesley Noon, MD  lidocaine (XYLOCAINE) 5 % ointment Apply 1 Application topically daily as needed. 08/17/22   [provider]  MAGNESIUM PO  Take 1 tablet by mouth daily.    [provider]  methocarbamol (ROBAXIN) 500 MG tablet Take 1 tablet (500 mg total) by mouth every 8 (eight) hours as needed for muscle spasms. 03/26/23   Gilda Crease, MD  methylPREDNISolone (MEDROL DOSEPAK) 4 MG TBPK tablet Take as directed per package instructions 04/09/23   Darrick Grinder, PA-C  metoCLOPramide (REGLAN) 10 MG tablet Take 1 tablet (10 mg total) by mouth every 6 (six) hours as needed. Patient not taking: Reported on 12/26/2021 02/28/19   Sharman Cheek, MD  metoCLOPramide (REGLAN) 10 MG tablet Take 1 tablet (10 mg total) by mouth every 8 (eight) hours as needed for refractory nausea / vomiting. Patient not taking: Reported on 12/26/2021 04/18/19   Felicie Morn, NP  ondansetron (ZOFRAN) 4 MG tablet Take 1 tablet (4 mg total) by mouth every 8 (eight) hours as needed for nausea or vomiting. 10/13/22   Curatolo, Adam, DO  ondansetron (ZOFRAN-ODT) 8 MG disintegrating tablet Take 1 tablet (8 mg total) by mouth every 8 (eight) hours as needed for nausea or vomiting. 01/16/23   Linwood Dibbles, MD  pantoprazole (PROTONIX) 20 MG tablet Take 1 tablet (20 mg total) by mouth daily. 01/16/23   Linwood Dibbles, MD  predniSONE (DELTASONE) 20 MG tablet Take 2 tablets (40 mg total) by mouth daily with breakfast. 11/04/22   Bing Neighbors, NP  predniSONE (DELTASONE) 20 MG tablet Take 2 tablets (40 mg total) by mouth daily. 11/09/22   White, Elita Boone, NP  rOPINIRole (REQUIP) 2 MG tablet Take 2 mg by mouth 2 (two) times daily. 08/17/22   [provider]  sucralfate (CARAFATE) 1 g  tablet Take 1 g by mouth 4 (four) times daily. 08/17/22   [provider]  zolpidem (AMBIEN) 10 MG tablet Take 10 mg by mouth at bedtime as needed for sleep. Patient not taking: Reported on 11/04/2022    [provider]    ___________________________________________________________________________________________________ Physical Exam:    05/17/2023    11:29 PM 05/17/2023   10:57 PM 05/17/2023    9:45 PM  Vitals with BMI  Height 5\' 6"     Weight 170 lbs    BMI 27.45    Systolic 139 139 347  Diastolic 94 94 73  Pulse 89 89 90     1. General:  in No ***Acute distress***increased work of breathing ***complaining of severe pain****agitated * Chronically ill *well *cachectic *toxic acutely ill -appearing 2. Psychological: Alert and *** Oriented 3. Head/ENT:   Moist *** Dry Mucous Membranes                          Head Non traumatic, neck supple                          Normal *** Poor Dentition 4. SKIN: normal *** decreased Skin turgor,  Skin clean Dry and intact no rash    5. Heart: Regular rate and rhythm no*** Murmur, no Rub or gallop 6. Lungs: ***Clear to auscultation bilaterally, no wheezes or crackles   7. Abdomen: Soft, ***non-tender, Non distended *** obese ***bowel sounds present 8. Lower extremities: no clubbing, cyanosis, no ***edema 9. Neurologically Grossly intact, moving all 4 extremities equally *** strength 5 out of 5 in all 4 extremities cranial nerves II through XII intact 10. MSK: Normal range of motion    Chart has been reviewed  ______________________________________________________________________________________________  Assessment/Plan  ***  Admitted for *** Subacute cough ***  Atypical chest pain ***  Shortness of breath ***  Hypoxia ***  Left lower quadrant abdominal pain ***  Nausea and vomiting, unspecified vomiting type ***  Hyperglycemia ***    Present on Admission: . Acute hypoxemic respiratory failure (HCC)     No problem-specific Assessment & Plan notes found for this encounter.    Other plan as per orders.  DVT prophylaxis:  SCD      Code Status:   DNR/DNI   as per patient   I had personally discussed CODE STATUS with patient and family  ACP   none    Family Communication:   Family not at  Bedside    Diet  Diet Orders (From admission, onward)     Start      Ordered   05/17/23 1645  Diet NPO time specified  (Undifferentiated presentation (screening labs and basic nursing orders))  Diet effective now        05/17/23 1646            Disposition Plan:      To home once workup is complete and patient is stable  ***Following barriers for discharge:                             Chest pain *** Stroke *** work up is complete                            Electrolytes corrected  Anemia corrected h/H stable                             Pain controlled with PO medications                               Afebrile, white count improving able to transition to PO antibiotics                             Will need to be able to tolerate PO                            Will likely need home health, home O2, set up                           Will need consultants to evaluate patient prior to discharge       Consult Orders  (From admission, onward)           Start     Ordered   05/17/23 2000  Consult to hospitalist  Paged by Nehemiah Settle  Once       Provider:  (Not yet assigned)  Question Answer Comment  Place call to: [facility]   Reason for Consult Admit      05/17/23 1959                               Would benefit from PT/OT eval prior to DC  Ordered                   Consults called: ***     Admission status:  ED Disposition     ED Disposition  Admit   Condition  --   Comment  Hospital Area: [facility] [[id number]]  Level of Care: Telemetry [5]  Admit to tele based on following criteria: Complex arrhythmia (Bradycardia/Tachycardia)  Interfacility transfer: Yes  May place patient in observation at [facility] or [facility] if equivalent level of care is available:: Yes  Covid Evaluation: Asymptomatic - no recent exposure (last 10 days) testing not required  Diagnosis: Acute hypoxemic respiratory failure [facility]) [[id number]]  Admitting Physician: Arlean Hopping [[id number]]  Attending  Physician: Derwood Kaplan [phone]          Obs     Level of care     tele  For  24H      Lab Results  Component Value Date   SARSCOV2NAA NEGATIVE 05/17/2023     Precautions: admitted as  Covid Negative     Tylik Treese 05/17/2023, 11:59 PM    [facility]     after 2 AM please page floor coverage PA If 7AM-7PM, please contact the day team taking care of the patient using [email or web address]

## 2023-05-17 NOTE — ED Triage Notes (Signed)
Dx PNA on 05/02/2022 Given amox Started nausea vomiting on 05/14/2022 sob

## 2023-05-17 NOTE — ED Notes (Signed)
Upon return to room, patient remains on room air. SpO2 decreased to 88% while resting in bed. 2LNC reapplied, EDP aware.

## 2023-05-17 NOTE — ED Notes (Signed)
Patient transported to CT 

## 2023-05-17 NOTE — ED Provider Notes (Signed)
[place] EMERGENCY DEPARTMENT AT [facility] Provider Note   CSN: [id number] Arrival date & time: 05/17/23  1529     History  Chief Complaint  Patient presents with   Pneumonia    Shawna [name] is a 58 y.o. female with COPD presents with complaints of chest pain, shortness of breath, and vomiting.  Endorses diarrhea as well, however this is her baseline.  Was diagnosed with pneumonia on 1/6.  Discharged on amoxicillin 500 mg twice daily.  She reports she has been consistent with this without any notable improvement of her symptoms.  On 1/18 she started experiencing nausea, vomiting and diarrhea in addition to chest pain and shortness of breath.  Reports persistent productive cough with yellow sputum.  Chest pain is central radiates down to left side.  Complains of worsening pain shortness of breath with exertion.  Complains of abdominal pain on her left upper and lower quadrants.  Denies any hematochezia or melena.  Has no history of cardiac issues or blood clots.   Pneumonia Associated symptoms include chest pain, abdominal pain and shortness of breath.       Home Medications Prior to Admission medications   Medication Sig Start Date End Date Taking? Authorizing Provider  acetaminophen (TYLENOL) 500 MG tablet Take 500 mg by mouth every 6 (six) hours as needed. Patient not taking: Reported on 12/26/2021    [provider]  albuterol (PROVENTIL HFA;VENTOLIN HFA) 108 (90 Base) MCG/ACT inhaler Inhale 2 puffs into the lungs every 6 (six) hours as needed for wheezing or shortness of breath. 01/16/16   Jennye Moccasin, MD  aluminum-magnesium hydroxide-simethicone (MAALOX) 200-200-20 MG/5ML SUSP Take 30 mLs by mouth 4 (four) times daily -  before meals and at bedtime. Patient not taking: Reported on 08/03/2022 02/28/19   Sharman Cheek, MD  atorvastatin (LIPITOR) 10 MG tablet Take 10 mg by mouth at bedtime. 08/12/22   [provider]  buPROPion (WELLBUTRIN SR) 150  MG 12 hr tablet Take 150 mg by mouth daily. 11/03/22   [provider]  clonazePAM (KLONOPIN) 0.5 MG tablet Take 0.5 mg by mouth 2 (two) times daily as needed for anxiety.    [provider]  clonazePAM (KLONOPIN) 1 MG tablet Take 0.5 mg by mouth 2 (two) times daily as needed for anxiety. 10/15/22   [provider]  dexlansoprazole (DEXILANT) 60 MG capsule Take 1 capsule by mouth daily. Patient not taking: Reported on 12/26/2021    [provider]  dicyclomine (BENTYL) 20 MG tablet Take 1 tablet (20 mg total) by mouth 2 (two) times daily. 01/16/23   Linwood Dibbles, MD  DULoxetine 40 MG CPEP Take 40 mg by mouth daily. Patient not taking: Reported on 12/26/2021 07/27/16   Pucilowska, Braulio Conte B, MD  famotidine (PEPCID) 20 MG tablet Take 1 tablet (20 mg total) by mouth 2 (two) times daily. 02/28/19   Sharman Cheek, MD  fexofenadine (ALLEGRA) 180 MG tablet Take 180 mg by mouth daily.    [provider]  FLUoxetine (PROZAC) 20 MG capsule Take 20 mg by mouth daily. 07/16/22   [provider]  FLUoxetine (PROZAC) 40 MG capsule Take 40 mg by mouth daily. Patient not taking: Reported on 11/04/2022    [provider]  fluticasone (FLONASE) 50 MCG/ACT nasal spray Place 2 sprays into both nostrils daily.     [provider]  gabapentin (NEURONTIN) 300 MG capsule Take 1 capsule (300 mg total) by mouth 3 (three) times daily. 03/28/23  Horton, Mayer Masker, MD  hydrOXYzine (ATARAX) 25 MG tablet Take 25 mg by mouth 4 (four) times daily. 07/30/22   [provider]  hydrOXYzine (ATARAX/VISTARIL) 50 MG tablet Take 50 mg by mouth every 6 (six) hours as needed.     [provider]  hydrOXYzine (VISTARIL) 50 MG capsule Take 100 mg by mouth every 8 (eight) hours as needed. 08/12/22   [provider]  lidocaine (XYLOCAINE) 2 % solution Use as directed 15 mLs in the mouth or throat as needed for mouth pain. Patient not taking: Reported on  08/03/2022 04/13/19   Chesley Noon, MD  lidocaine (XYLOCAINE) 5 % ointment Apply 1 Application topically daily as needed. 08/17/22   [provider]  MAGNESIUM PO Take 1 tablet by mouth daily.    [provider]  methocarbamol (ROBAXIN) 500 MG tablet Take 1 tablet (500 mg total) by mouth every 8 (eight) hours as needed for muscle spasms. 03/26/23   Gilda Crease, MD  methylPREDNISolone (MEDROL DOSEPAK) 4 MG TBPK tablet Take as directed per package instructions 04/09/23   Darrick Grinder, PA-C  metoCLOPramide (REGLAN) 10 MG tablet Take 1 tablet (10 mg total) by mouth every 6 (six) hours as needed. Patient not taking: Reported on 12/26/2021 02/28/19   Sharman Cheek, MD  metoCLOPramide (REGLAN) 10 MG tablet Take 1 tablet (10 mg total) by mouth every 8 (eight) hours as needed for refractory nausea / vomiting. Patient not taking: Reported on 12/26/2021 04/18/19   Felicie Morn, NP  ondansetron (ZOFRAN) 4 MG tablet Take 1 tablet (4 mg total) by mouth every 8 (eight) hours as needed for nausea or vomiting. 10/13/22   Curatolo, Adam, DO  ondansetron (ZOFRAN-ODT) 8 MG disintegrating tablet Take 1 tablet (8 mg total) by mouth every 8 (eight) hours as needed for nausea or vomiting. 01/16/23   Linwood Dibbles, MD  pantoprazole (PROTONIX) 20 MG tablet Take 1 tablet (20 mg total) by mouth daily. 01/16/23   Linwood Dibbles, MD  predniSONE (DELTASONE) 20 MG tablet Take 2 tablets (40 mg total) by mouth daily with breakfast. 11/04/22   Bing Neighbors, NP  predniSONE (DELTASONE) 20 MG tablet Take 2 tablets (40 mg total) by mouth daily. 11/09/22   White, Elita Boone, NP  rOPINIRole (REQUIP) 2 MG tablet Take 2 mg by mouth 2 (two) times daily. 08/17/22   [provider]  sucralfate (CARAFATE) 1 g tablet Take 1 g by mouth 4 (four) times daily. 08/17/22   [provider]  zolpidem (AMBIEN) 10 MG tablet Take 10 mg by mouth at bedtime as needed for sleep. Patient not taking: Reported on  11/04/2022    [provider]      Allergies    Haloperidol, Hydrocodone-acetaminophen, Morphine, Morphine and codeine, Pravastatin, Pravastatin sodium, Promethazine hcl, Fentanyl, and Propofol    Review of Systems   Review of Systems  Respiratory:  Positive for shortness of breath.   Cardiovascular:  Positive for chest pain.  Gastrointestinal:  Positive for abdominal pain, diarrhea and vomiting.    Physical Exam Updated Vital Signs BP 117/76   Pulse 88   Temp 98 F (36.7 C)   Resp 13   SpO2 96%  Physical Exam Vitals and nursing note reviewed.  Constitutional:      General: She is not in acute distress.    Appearance: She is well-developed.  HENT:     Head: Normocephalic and atraumatic.  Eyes:     Conjunctiva/sclera: Conjunctivae normal.  Cardiovascular:  Rate and Rhythm: Regular rhythm. Tachycardia present.     Heart sounds: No murmur heard. Pulmonary:     Effort: Pulmonary effort is normal. No respiratory distress.     Breath sounds: Normal breath sounds. No wheezing or rales.     Comments: As chest tenderness reported from old sternal injury.  This pain is different than what she is currently experiencing. Abdominal:     Palpations: Abdomen is soft.     Comments: Left upper and lower quadrant tenderness without rebound or guarding.  No peritoneal signs or distention  Musculoskeletal:        General: No swelling or tenderness.     Cervical back: Neck supple.     Right lower leg: No edema.     Left lower leg: No edema.  Skin:    General: Skin is warm and dry.     Capillary Refill: Capillary refill takes less than 2 seconds.  Neurological:     General: No focal deficit present.     Mental Status: She is alert and oriented to person, place, and time.  Psychiatric:        Mood and Affect: Mood normal.     ED Results / Procedures / Treatments   Labs (all labs ordered are listed, but only abnormal results are displayed) Labs Reviewed  CBC - Abnormal;  Notable for the following components:      Result Value   WBC 13.3 (*)    All other components within normal limits  COMPREHENSIVE METABOLIC PANEL - Abnormal; Notable for the following components:   Glucose, Bld 253 (*)    AST 14 (*)    All other components within normal limits  URINALYSIS, ROUTINE W REFLEX MICROSCOPIC - Abnormal; Notable for the following components:   Glucose, UA 500 (*)    Protein, ur TRACE (*)    All other components within normal limits  RAPID URINE DRUG SCREEN, HOSP PERFORMED - Abnormal; Notable for the following components:   Benzodiazepines POSITIVE (*)    Tetrahydrocannabinol POSITIVE (*)    All other components within normal limits  RESP PANEL BY RT-PCR (RSV, FLU A&B, COVID)  RVPGX2  CULTURE, BLOOD (ROUTINE X 2)  CULTURE, BLOOD (ROUTINE X 2)  LIPASE, BLOOD  BRAIN NATRIURETIC PEPTIDE  LACTIC ACID, PLASMA  PROTIME-INR  APTT  ETHANOL  TROPONIN I (HIGH SENSITIVITY)  TROPONIN I (HIGH SENSITIVITY)    EKG EKG Interpretation Date/Time:  Monday May 17 2023 15:50:28 EST Ventricular Rate:  112 PR Interval:  119 QRS Duration:  104 QT Interval:  344 QTC Calculation: 470 R Axis:   100  Text Interpretation: Sinus tachycardia Right axis deviation No acute changes No significant change since last tracing Confirmed by Derwood Kaplan [phone]) on 05/17/2023 5:14:05 PM  Radiology CT ABDOMEN PELVIS W CONTRAST Result Date: 05/17/2023 CLINICAL DATA:  Left lower quadrant pain, nausea, vomiting EXAM: CT ABDOMEN AND PELVIS WITH CONTRAST TECHNIQUE: Multidetector CT imaging of the abdomen and pelvis was performed using the standard protocol following bolus administration of intravenous contrast. RADIATION DOSE REDUCTION: This exam was performed according to the departmental dose-optimization program which includes automated exposure control, adjustment of the mA and/or kV according to patient size and/or use of iterative reconstruction technique. CONTRAST:  OMNIPAQUE  IOHEXOL 350 MG/ML SOLN COMPARISON:  None Available. FINDINGS: Lower chest: No acute findings. Hepatobiliary: No focal hepatic abnormality. Gallbladder unremarkable. Pancreas: No focal abnormality or ductal dilatation. Spleen: No focal abnormality.  Normal size. Adrenals/Urinary Tract: No adrenal abnormality. No  focal renal abnormality. No stones or hydronephrosis. Urinary bladder is unremarkable. Stomach/Bowel: Normal appendix. Stomach, large and small bowel grossly unremarkable. Vascular/Lymphatic: No evidence of aneurysm or adenopathy. Reproductive: Prior hysterectomy.  No adnexal masses. Other: No free fluid or free air. Musculoskeletal: No acute bony abnormality. IMPRESSION: No acute findings in the abdomen or pelvis. Electronically Signed   By: Charlett Nose M.D.   On: 05/17/2023 18:14   CT Angio Chest PE W and/or Wo Contrast Result Date: 05/17/2023 CLINICAL DATA:  Pulmonary embolism (PE) suspected, high prob. Shortness of breath. EXAM: CT ANGIOGRAPHY CHEST WITH CONTRAST TECHNIQUE: Multidetector CT imaging of the chest was performed using the standard protocol during bolus administration of intravenous contrast. Multiplanar CT image reconstructions and MIPs were obtained to evaluate the vascular anatomy. RADIATION DOSE REDUCTION: This exam was performed according to the departmental dose-optimization program which includes automated exposure control, adjustment of the mA and/or kV according to patient size and/or use of iterative reconstruction technique. CONTRAST:  OMNIPAQUE IOHEXOL 350 MG/ML SOLN COMPARISON:  01/23/2015.  Chest x-ray today. FINDINGS: Cardiovascular: No filling defects in the pulmonary arteries to suggest pulmonary emboli. Heart is normal size. Aorta is normal caliber. Mediastinum/Nodes: No mediastinal, hilar, or axillary adenopathy. Trachea and esophagus are unremarkable. Thyroid unremarkable. Lungs/Pleura: Moderate centrilobular and paraseptal emphysema. No confluent opacities or  effusions. Upper Abdomen: No acute findings Musculoskeletal: Chest wall soft tissues are unremarkable. No acute bony abnormality. Review of the MIP images confirms the above findings. IMPRESSION: No evidence of pulmonary embolus. No acute cardiopulmonary disease. Emphysema (ICD10-J43.9). Electronically Signed   By: Charlett Nose M.D.   On: 05/17/2023 18:13   DG Chest 2 View Result Date: 05/17/2023 CLINICAL DATA:  Shortness of breath EXAM: CHEST - 2 VIEW COMPARISON:  05/10/2023 FINDINGS: Emphysematous/chronic changes within the lungs. No acute confluent opacities or effusions. Heart and mediastinal contours within normal limits. No acute bony abnormality. IMPRESSION: COPD/chronic changes. No active disease. Electronically Signed   By: Charlett Nose M.D.   On: 05/17/2023 17:20    Procedures Procedures    Medications Ordered in ED Medications  albuterol (VENTOLIN HFA) 108 (90 Base) MCG/ACT inhaler 2 puff (2 puffs Inhalation Given 05/17/23 1557)  alum & mag hydroxide-simeth (MAALOX/MYLANTA) 200-200-20 MG/5ML suspension 30 mL (30 mLs Oral Given 05/17/23 1626)  dicyclomine (BENTYL) capsule 10 mg (10 mg Oral Given 05/17/23 1626)  oxyCODONE (Oxy IR/ROXICODONE) immediate release tablet 5 mg (5 mg Oral Given 05/17/23 1626)  ondansetron (ZOFRAN) injection 4 mg (4 mg Intravenous Given 05/17/23 1626)  iohexol (OMNIPAQUE) 350 MG/ML injection 100 mL (100 mLs Intravenous Contrast Given 05/17/23 1748)  ipratropium-albuterol (DUONEB) 0.5-2.5 (3) MG/3ML nebulizer solution 3 mL (3 mLs Nebulization Given 05/17/23 1925)  methylPREDNISolone sodium succinate (SOLU-MEDROL) 125 mg/2 mL injection 125 mg (125 mg Intravenous Given 05/17/23 1913)    ED Course/ Medical Decision Making/ A&P                                 Medical Decision Making Amount and/or Complexity of Data Reviewed Labs: ordered. Radiology: ordered.  Risk OTC drugs. Prescription drug management.   This patient presents to the ED with chief  complaint(s) of chest pain, shortness of breath cough, vomiting.  The complaint involves an extensive differential diagnosis and also carries with it a high risk of complications and morbidity.   pertinent past medical history as listed in HPI  The differential diagnosis includes  ACS, PE, pneumonia,  COPD exacerbation, viral URI, aortic dissection, pneumothorax, gastroenteritis, appendicitis, diverticulitis, cholecystitis, pyelonephritis, nephrolithiasis, cystitis The initial plan is to  Will obtain basic labs, EKG, chest x-ray, pain control Additional history obtained: Records reviewed previous admission documents and Care Everywhere/External Records  Initial Assessment:   Patient presents tachycardic with complaints of chest pain, shortness of breath, persistent cough, vomiting diarrhea.  Vitals are otherwise stable.  Recently diagnosed and treated with amoxicillin for pneumonia.  Lung sounds are clear.  There is no wheezing to his suggest COPD.  No Rales or fever to suggest persistent pneumonia.  She does have ongoing congestion which could suggest a URI component.  She has no cardiac history or history of blood clots.   Independent ECG interpretation:  Sinus tachycardia without ischemic changes  Independent labs interpretation:  The following labs were independently interpreted:  CBC with leukocytosis of 13.3, CMP with glucose of 253, anion gap within normal limits respiratory panel negative, lactic acid without elevation, troponin without elevation, lipase within normal limits, ethanol within normal limits, UA with glucose of 500, no ketones  Independent visualization and interpretation of imaging: I independently visualized the following imaging with scope of interpretation limited to determining acute life threatening conditions related to emergency care:  Chest x-ray, which revealed chronic lung disease changes without acute abnormality CT angio chest moderate emphysema without any  acute findings CT abdomen pelvis with contrast no acute abnormality  Treatment and Reassessment: Patient given oxycodone, GI cocktail and Zofran following first assessment.  Patient given DuoNeb and Solu-Medrol.  Upon reassessment 4:30 PM patient reports there is a figure in the corner and is staring at them.  No auditory or command hallucinations.  Denies any EtOH or drug use.  Neuroexam is without focal deficit.  Patient has a history of visual hallucinations.  Patient saturations dropped down to 89% was tachycardic to 115 while ambulating.  Saturations remained around 90% while in bed thereafter, placed on 2 L nasal cannula.  Will plan to admit.   Consultations obtained:   Inpatient hospitalist 8 PM Received return call from Dr. Dessa Phi, agreed to admit patient.  Disposition:   Patient will be admitted for hypoxia and weakness.  Social Determinants of Health:   Patient's impaired access to primary care  increases the complexity of managing their presentation  This note was dictated with voice recognition software.  Despite best efforts at proofreading, errors may have occurred which can change the documentation meaning.          Final Clinical Impression(s) / ED Diagnoses Final diagnoses:  Subacute cough  Atypical chest pain  Shortness of breath  Hypoxia  Left lower quadrant abdominal pain  Nausea and vomiting, unspecified vomiting type  Hyperglycemia    Rx / DC Orders ED Discharge Orders     None         Fabienne Bruns 05/17/23 2107    Derwood Kaplan, MD 05/18/23 [phone]

## 2023-05-17 NOTE — ED Notes (Signed)
Given meal

## 2023-05-18 ENCOUNTER — Observation Stay (HOSPITAL_COMMUNITY): Payer: 59

## 2023-05-18 DIAGNOSIS — Z79899 Other long term (current) drug therapy: Secondary | ICD-10-CM | POA: Diagnosis not present

## 2023-05-18 DIAGNOSIS — E1165 Type 2 diabetes mellitus with hyperglycemia: Secondary | ICD-10-CM | POA: Diagnosis present

## 2023-05-18 DIAGNOSIS — Z7984 Long term (current) use of oral hypoglycemic drugs: Secondary | ICD-10-CM | POA: Diagnosis not present

## 2023-05-18 DIAGNOSIS — F17210 Nicotine dependence, cigarettes, uncomplicated: Secondary | ICD-10-CM | POA: Diagnosis present

## 2023-05-18 DIAGNOSIS — J441 Chronic obstructive pulmonary disease with (acute) exacerbation: Secondary | ICD-10-CM | POA: Diagnosis present

## 2023-05-18 DIAGNOSIS — J9601 Acute respiratory failure with hypoxia: Secondary | ICD-10-CM | POA: Diagnosis present

## 2023-05-18 DIAGNOSIS — E119 Type 2 diabetes mellitus without complications: Secondary | ICD-10-CM

## 2023-05-18 DIAGNOSIS — I10 Essential (primary) hypertension: Secondary | ICD-10-CM | POA: Diagnosis present

## 2023-05-18 DIAGNOSIS — Z884 Allergy status to anesthetic agent status: Secondary | ICD-10-CM | POA: Diagnosis not present

## 2023-05-18 DIAGNOSIS — F1220 Cannabis dependence, uncomplicated: Secondary | ICD-10-CM | POA: Diagnosis present

## 2023-05-18 DIAGNOSIS — F4323 Adjustment disorder with mixed anxiety and depressed mood: Secondary | ICD-10-CM | POA: Diagnosis present

## 2023-05-18 DIAGNOSIS — E785 Hyperlipidemia, unspecified: Secondary | ICD-10-CM | POA: Diagnosis present

## 2023-05-18 DIAGNOSIS — R079 Chest pain, unspecified: Secondary | ICD-10-CM | POA: Diagnosis not present

## 2023-05-18 DIAGNOSIS — R0602 Shortness of breath: Secondary | ICD-10-CM | POA: Diagnosis present

## 2023-05-18 DIAGNOSIS — Z66 Do not resuscitate: Secondary | ICD-10-CM | POA: Diagnosis present

## 2023-05-18 DIAGNOSIS — K219 Gastro-esophageal reflux disease without esophagitis: Secondary | ICD-10-CM | POA: Diagnosis present

## 2023-05-18 DIAGNOSIS — Z794 Long term (current) use of insulin: Secondary | ICD-10-CM | POA: Diagnosis not present

## 2023-05-18 DIAGNOSIS — F1320 Sedative, hypnotic or anxiolytic dependence, uncomplicated: Secondary | ICD-10-CM | POA: Diagnosis present

## 2023-05-18 DIAGNOSIS — Z1152 Encounter for screening for COVID-19: Secondary | ICD-10-CM | POA: Diagnosis not present

## 2023-05-18 DIAGNOSIS — Z888 Allergy status to other drugs, medicaments and biological substances status: Secondary | ICD-10-CM | POA: Diagnosis not present

## 2023-05-18 DIAGNOSIS — Z885 Allergy status to narcotic agent status: Secondary | ICD-10-CM | POA: Diagnosis not present

## 2023-05-18 LAB — GLUCOSE, CAPILLARY
Glucose-Capillary: 187 mg/dL — ABNORMAL HIGH (ref 70–99)
Glucose-Capillary: 213 mg/dL — ABNORMAL HIGH (ref 70–99)
Glucose-Capillary: 242 mg/dL — ABNORMAL HIGH (ref 70–99)
Glucose-Capillary: 250 mg/dL — ABNORMAL HIGH (ref 70–99)
Glucose-Capillary: 315 mg/dL — ABNORMAL HIGH (ref 70–99)
Glucose-Capillary: 373 mg/dL — ABNORMAL HIGH (ref 70–99)
Glucose-Capillary: 384 mg/dL — ABNORMAL HIGH (ref 70–99)
Glucose-Capillary: 408 mg/dL — ABNORMAL HIGH (ref 70–99)
Glucose-Capillary: 409 mg/dL — ABNORMAL HIGH (ref 70–99)

## 2023-05-18 LAB — CBC
HCT: 39.8 % (ref 36.0–46.0)
Hemoglobin: 13.2 g/dL (ref 12.0–15.0)
MCH: 30.9 pg (ref 26.0–34.0)
MCHC: 33.2 g/dL (ref 30.0–36.0)
MCV: 93.2 fL (ref 80.0–100.0)
Platelets: 362 10*3/uL (ref 150–400)
RBC: 4.27 MIL/uL (ref 3.87–5.11)
RDW: 12.6 % (ref 11.5–15.5)
WBC: 11.5 10*3/uL — ABNORMAL HIGH (ref 4.0–10.5)
nRBC: 0 % (ref 0.0–0.2)

## 2023-05-18 LAB — TSH: TSH: 0.86 u[IU]/mL (ref 0.350–4.500)

## 2023-05-18 LAB — ECHOCARDIOGRAM COMPLETE
AR max vel: 1.85 cm2
AV Area VTI: 2.3 cm2
AV Area mean vel: 1.77 cm2
AV Mean grad: 2 mm[Hg]
AV Peak grad: 4.3 mm[Hg]
Ao pk vel: 1.04 m/s
Area-P 1/2: 4.39 cm2
Calc EF: 57.8 %
Height: 66 in
S' Lateral: 2.8 cm
Single Plane A2C EF: 64 %
Single Plane A4C EF: 54.5 %
Weight: 2720 [oz_av]

## 2023-05-18 LAB — BLOOD GAS, VENOUS
Acid-base deficit: 1.5 mmol/L (ref 0.0–2.0)
Bicarbonate: 24.3 mmol/L (ref 20.0–28.0)
Drawn by: 7948
O2 Saturation: 65.5 %
Patient temperature: 37
pCO2, Ven: 44 mm[Hg] (ref 44–60)
pH, Ven: 7.35 (ref 7.25–7.43)
pO2, Ven: 38 mm[Hg] (ref 32–45)

## 2023-05-18 LAB — LIPID PANEL
Cholesterol: 263 mg/dL — ABNORMAL HIGH (ref 0–200)
HDL: 40 mg/dL — ABNORMAL LOW (ref >40)
LDL Cholesterol: 175 mg/dL — ABNORMAL HIGH (ref 0–99)
Total CHOL/HDL Ratio: 6.6 {ratio}
Triglycerides: 239 mg/dL — ABNORMAL HIGH (ref <150)
VLDL: 48 mg/dL — ABNORMAL HIGH (ref 0–40)

## 2023-05-18 LAB — COMPREHENSIVE METABOLIC PANEL
ALT: 18 U/L (ref 0–44)
AST: 16 U/L (ref 15–41)
Albumin: 3.7 g/dL (ref 3.5–5.0)
Alkaline Phosphatase: 83 U/L (ref 38–126)
Anion gap: 10 (ref 5–15)
BUN: 17 mg/dL (ref 6–20)
CO2: 20 mmol/L — ABNORMAL LOW (ref 22–32)
Calcium: 8.9 mg/dL (ref 8.9–10.3)
Chloride: 102 mmol/L (ref 98–111)
Creatinine, Ser: 0.95 mg/dL (ref 0.44–1.00)
GFR, Estimated: >60 mL/min (ref >60)
Glucose, Bld: 390 mg/dL — ABNORMAL HIGH (ref 70–99)
Potassium: 4.5 mmol/L (ref 3.5–5.1)
Sodium: 132 mmol/L — ABNORMAL LOW (ref 135–145)
Total Bilirubin: 0.8 mg/dL (ref 0.0–1.2)
Total Protein: 6.9 g/dL (ref 6.5–8.1)

## 2023-05-18 LAB — HIV ANTIBODY (ROUTINE TESTING W REFLEX): HIV Screen 4th Generation wRfx: NONREACTIVE

## 2023-05-18 LAB — HEMOGLOBIN A1C
Hgb A1c MFr Bld: 7.9 % — ABNORMAL HIGH (ref 4.8–5.6)
Mean Plasma Glucose: 180.03 mg/dL

## 2023-05-18 LAB — MRSA NEXT GEN BY PCR, NASAL: MRSA by PCR Next Gen: NOT DETECTED

## 2023-05-18 LAB — MAGNESIUM: Magnesium: 2.2 mg/dL (ref 1.7–2.4)

## 2023-05-18 LAB — CK: Total CK: 54 U/L (ref 38–234)

## 2023-05-18 LAB — PHOSPHORUS: Phosphorus: 3 mg/dL (ref 2.5–4.6)

## 2023-05-18 MED ORDER — ONDANSETRON HCL 4 MG PO TABS
4.0000 mg | ORAL_TABLET | Freq: Four times a day (QID) | ORAL | Status: DC | PRN
  Administered 2023-05-18 – 2023-05-19 (×2): 4 mg via ORAL
  Filled 2023-05-18 (×2): qty 1

## 2023-05-18 MED ORDER — ACETAMINOPHEN 325 MG PO TABS
650.0000 mg | ORAL_TABLET | Freq: Four times a day (QID) | ORAL | Status: DC | PRN
  Administered 2023-05-18 (×2): 650 mg via ORAL
  Filled 2023-05-18 (×2): qty 2

## 2023-05-18 MED ORDER — GABAPENTIN 300 MG PO CAPS
300.0000 mg | ORAL_CAPSULE | Freq: Three times a day (TID) | ORAL | Status: DC
  Administered 2023-05-19: 300 mg via ORAL
  Filled 2023-05-18 (×3): qty 1

## 2023-05-18 MED ORDER — DICYCLOMINE HCL 10 MG PO CAPS
10.0000 mg | ORAL_CAPSULE | Freq: Once | ORAL | Status: AC
  Administered 2023-05-18: 10 mg via ORAL
  Filled 2023-05-18: qty 1

## 2023-05-18 MED ORDER — FAMOTIDINE 20 MG PO TABS
20.0000 mg | ORAL_TABLET | Freq: Two times a day (BID) | ORAL | Status: DC
  Administered 2023-05-18 – 2023-05-19 (×4): 20 mg via ORAL
  Filled 2023-05-18 (×4): qty 1

## 2023-05-18 MED ORDER — SODIUM CHLORIDE 0.9 % IV SOLN: INTRAVENOUS | Status: DC

## 2023-05-18 MED ORDER — IPRATROPIUM-ALBUTEROL 0.5-2.5 (3) MG/3ML IN SOLN
3.0000 mL | Freq: Four times a day (QID) | RESPIRATORY_TRACT | Status: DC
  Administered 2023-05-19: 3 mL via RESPIRATORY_TRACT
  Filled 2023-05-18: qty 3

## 2023-05-18 MED ORDER — ATORVASTATIN CALCIUM 10 MG PO TABS
10.0000 mg | ORAL_TABLET | Freq: Every day | ORAL | Status: DC
  Administered 2023-05-18 (×2): 10 mg via ORAL
  Filled 2023-05-18 (×2): qty 1

## 2023-05-18 MED ORDER — ACETAMINOPHEN 650 MG RE SUPP: 650.0000 mg | Freq: Four times a day (QID) | RECTAL | Status: DC | PRN

## 2023-05-18 MED ORDER — ALBUTEROL SULFATE (2.5 MG/3ML) 0.083% IN NEBU: 2.5000 mg | INHALATION_SOLUTION | RESPIRATORY_TRACT | Status: DC | PRN

## 2023-05-18 MED ORDER — GUAIFENESIN ER 600 MG PO TB12
600.0000 mg | ORAL_TABLET | Freq: Two times a day (BID) | ORAL | Status: DC
  Administered 2023-05-18 – 2023-05-19 (×4): 600 mg via ORAL
  Filled 2023-05-18 (×4): qty 1

## 2023-05-18 MED ORDER — INSULIN GLARGINE-YFGN 100 UNIT/ML ~~LOC~~ SOLN
10.0000 [IU] | Freq: Every day | SUBCUTANEOUS | Status: DC
  Administered 2023-05-18 (×2): 10 [IU] via SUBCUTANEOUS
  Filled 2023-05-18 (×4): qty 0.1

## 2023-05-18 MED ORDER — METHOCARBAMOL 500 MG PO TABS
500.0000 mg | ORAL_TABLET | Freq: Three times a day (TID) | ORAL | Status: DC | PRN
  Administered 2023-05-18: 500 mg via ORAL
  Filled 2023-05-18: qty 1

## 2023-05-18 MED ORDER — ALUM & MAG HYDROXIDE-SIMETH 200-200-20 MG/5ML PO SUSP
30.0000 mL | Freq: Once | ORAL | Status: AC
  Administered 2023-05-18: 30 mL via ORAL
  Filled 2023-05-18: qty 30

## 2023-05-18 MED ORDER — SUCRALFATE 1 G PO TABS
1.0000 g | ORAL_TABLET | Freq: Four times a day (QID) | ORAL | Status: DC
  Administered 2023-05-18 – 2023-05-19 (×5): 1 g via ORAL
  Filled 2023-05-18 (×5): qty 1

## 2023-05-18 MED ORDER — INSULIN ASPART 100 UNIT/ML IJ SOLN
0.0000 [IU] | INTRAMUSCULAR | Status: DC
  Administered 2023-05-18 (×2): 3 [IU] via SUBCUTANEOUS
  Administered 2023-05-18: 9 [IU] via SUBCUTANEOUS
  Administered 2023-05-18: 10 [IU] via SUBCUTANEOUS
  Administered 2023-05-18: 2 [IU] via SUBCUTANEOUS
  Administered 2023-05-18: 3 [IU] via SUBCUTANEOUS
  Administered 2023-05-18: 7 [IU] via SUBCUTANEOUS
  Administered 2023-05-19: 2 [IU] via SUBCUTANEOUS
  Administered 2023-05-19: 3 [IU] via SUBCUTANEOUS

## 2023-05-18 MED ORDER — IPRATROPIUM-ALBUTEROL 0.5-2.5 (3) MG/3ML IN SOLN
3.0000 mL | Freq: Four times a day (QID) | RESPIRATORY_TRACT | Status: DC
  Administered 2023-05-18 (×3): 3 mL via RESPIRATORY_TRACT
  Filled 2023-05-18 (×3): qty 3

## 2023-05-18 MED ORDER — ONDANSETRON HCL 4 MG/2ML IJ SOLN
4.0000 mg | Freq: Four times a day (QID) | INTRAMUSCULAR | Status: DC | PRN
  Administered 2023-05-18 (×2): 4 mg via INTRAVENOUS
  Filled 2023-05-18 (×2): qty 2

## 2023-05-18 MED ORDER — OXYCODONE HCL 5 MG PO TABS
5.0000 mg | ORAL_TABLET | Freq: Four times a day (QID) | ORAL | Status: DC | PRN
  Administered 2023-05-18 – 2023-05-19 (×5): 5 mg via ORAL
  Filled 2023-05-18 (×5): qty 1

## 2023-05-18 MED ORDER — ROPINIROLE HCL 1 MG PO TABS
2.0000 mg | ORAL_TABLET | Freq: Two times a day (BID) | ORAL | Status: DC
  Administered 2023-05-18 – 2023-05-19 (×4): 2 mg via ORAL
  Filled 2023-05-18 (×4): qty 2

## 2023-05-18 MED ORDER — IPRATROPIUM-ALBUTEROL 0.5-2.5 (3) MG/3ML IN SOLN: 3.0000 mL | Freq: Four times a day (QID) | RESPIRATORY_TRACT | Status: DC

## 2023-05-18 MED ORDER — DOXYCYCLINE HYCLATE 100 MG PO TABS
100.0000 mg | ORAL_TABLET | Freq: Two times a day (BID) | ORAL | Status: DC
  Administered 2023-05-18 (×2): 100 mg via ORAL
  Filled 2023-05-18 (×2): qty 1

## 2023-05-18 MED ORDER — HYDROXYZINE HCL 25 MG PO TABS
50.0000 mg | ORAL_TABLET | Freq: Four times a day (QID) | ORAL | Status: DC | PRN
  Administered 2023-05-18 – 2023-05-19 (×4): 50 mg via ORAL
  Filled 2023-05-18 (×4): qty 2

## 2023-05-18 MED ORDER — NICOTINE 14 MG/24HR TD PT24
14.0000 mg | MEDICATED_PATCH | Freq: Every day | TRANSDERMAL | Status: DC
  Administered 2023-05-19: 14 mg via TRANSDERMAL
  Filled 2023-05-18 (×2): qty 1

## 2023-05-18 MED ORDER — METHYLPREDNISOLONE SODIUM SUCC 40 MG IJ SOLR
40.0000 mg | Freq: Two times a day (BID) | INTRAMUSCULAR | Status: DC
  Administered 2023-05-18: 40 mg via INTRAVENOUS
  Filled 2023-05-18: qty 1

## 2023-05-18 MED ORDER — CLONAZEPAM 0.5 MG PO TABS
0.5000 mg | ORAL_TABLET | Freq: Two times a day (BID) | ORAL | Status: DC | PRN
  Administered 2023-05-18 – 2023-05-19 (×3): 0.5 mg via ORAL
  Filled 2023-05-18 (×3): qty 1

## 2023-05-18 MED ORDER — PREDNISONE 20 MG PO TABS: 40.0000 mg | ORAL_TABLET | Freq: Every day | ORAL | Status: DC

## 2023-05-18 MED ORDER — LIVING WELL WITH DIABETES BOOK
Freq: Once | Status: AC
  Filled 2023-05-18: qty 1

## 2023-05-18 NOTE — Progress Notes (Signed)
  Echocardiogram 2D Echocardiogram has been performed.  Ocie Doyne RDCS 05/18/2023, 9:58 AM

## 2023-05-18 NOTE — Inpatient Diabetes Management (Signed)
Inpatient Diabetes Program Recommendations  AACE/ADA: New Consensus Statement on Inpatient Glycemic Control (2015)  Target Ranges:  Prepandial:   less than 140 mg/dL      Peak postprandial:   less than 180 mg/dL (1-2 hours)      Critically ill patients:  140 - 180 mg/dL   Lab Results  Component Value Date   GLUCAP 242 (H) 05/18/2023   HGBA1C 7.9 (H) 05/18/2023    Review of Glycemic Control  Diabetes history: DM2 Outpatient Diabetes medications: None Current orders for Inpatient glycemic control: Semglee 10 at bedtime, Novolog 0-9 units Q4H  HgbA1C - 7.9% CBGs 315, 213, 242 mg/dL Eating 295%  Inpatient Diabetes Program Recommendations:    Consider increasing Semglee to 15 units at bedtime  Increase Novolog to 0-15 TID with meals and 0-5 HS  Spoke with pt at bedside regarding diagnosis of DM, although pt said her PCP had mentioned it before. Discussed glucose and HgbA1C of 7.9% (average blood sugar of approx 176 mg/dL.  Discussed glucose and A1C goals. Discussed importance of checking CBGs and maintaining good CBG control to prevent long-term and short-term complications. Explained how hyperglycemia leads to damage within blood vessels which lead to the common complications seen with uncontrolled diabetes. Stressed to the patient the importance of improving glycemic control to prevent further complications from uncontrolled diabetes. Discussed impact of nutrition, exercise, stress, sickness, and medications on diabetes control.  Discussed carbohydrates, substitutes for sugary beverages. Pt was not talkative regarding her diet, states she did not get any exercise. Lifestyle modification is first-line therapy for Type 2 DM. May benefit from adding low-dose 70/30 BID. Discussed hypoglycemia s/s and treatment. Will f/u on 1/22. If going home on insulin, will order Insulin pen starter kit.  Continue to follow.  Thank you. Ailene Ards, RD, LDN, CDCES Inpatient Diabetes  Coordinator [phone]

## 2023-05-18 NOTE — Assessment & Plan Note (Addendum)
New onset of diabetes mellitus Check TSH Order sliding scale Diabetes coordinator consult

## 2023-05-18 NOTE — Assessment & Plan Note (Addendum)
Continue Klonopin as needed and Atarax

## 2023-05-18 NOTE — Assessment & Plan Note (Signed)
You will need follow-up with primary care provider to see if benzodiazepines can be weaned off

## 2023-05-18 NOTE — Assessment & Plan Note (Signed)
Chronic continue Lipitor 10 mg a day

## 2023-05-18 NOTE — Assessment & Plan Note (Signed)
-  Spoke about importance of quitting spent 5 minutes discussing options for treatment, prior attempts at quitting, and dangers of smoking ? -At this point patient is    interested in quitting ? - order nicotine patch  ? - nursing tobacco cessation protocol ? ?

## 2023-05-18 NOTE — Progress Notes (Signed)
Progress Note   Patient: Shawna [name] VWU:[id number] DOB: 1965/06/29 DOA: 05/17/2023     0 DOS: the patient was seen and examined on 05/18/2023   Brief hospital course: 58yo with h/o COPD, HTN, HLD, and DM who presented with SOB.  She was recently diagnosed with PNA and treated with Amoxil and then developed n/v/d within a day or two.  O2 sats were 88% on presentation but have been normal since and she is stable on room air.  Antibiotics changed to Doxy for COPD exacerbation, started on steroids and nebs.  Assessment and Plan:  Generalized weakness Patient was recently diagnosed with CAP, treated with abx, got diarrhea She reports that n/v/d has resolved but she continues to feel generally weak She is not on home O2, had O2 sat 88% on presentation but has had no O2 requirement since She reports that the PNA is resolved and CXR/CTA without evidence of PNA She does have COPD on imaging Will continue to monitor PT/OT evaluations Based on low current suspicion for infection, will stop abx and steroids   Chest pain CP reproducible by palpation Has CVD risk factors such as new-onset diabetes, tobacco abuse and hyperlipidemia Troponins negative x 2 Echo pending Likely needs outpatient follow-up with cardiology   Adjustment disorder with mixed anxiety and depressed mood Continue Klonopin as needed and Atarax   Cannabis use disorder, moderate, dependence  Chronic/stable but may be contributing to n/v; cessation encouraged   Dyslipidemia Continue atorvastatin   GERD (gastroesophageal reflux disease) Continue Pepcid and Carafate Reports early satiety; if not improving, consider GI consult   Tobacco use disorder Encourage cessation.   This was discussed with the patient and should be reviewed on an ongoing basis.   Patch ordered at patient request.    DM2 (diabetes mellitus, type 2)  Denies history of DM A1c is 7.9, c/w DM Cover with moderate-scale SSI  Diabetes coordinator  consult         Consultants: PT OT Diabetes coordinatory   Procedures: Echocardiogram 1/21   Antibiotics: Doxycycline x 2 doses       Subjective: Continues to feel very tired.  No more respiratory symptoms noted.   Objective: Vitals:   05/18/23 1025 05/18/23 1344  BP: 106/67 109/80  Pulse: 78 90  Resp: 18 16  Temp: 98.1 F (36.7 C) 98.6 F (37 C)  SpO2: 100% 97%    Intake/Output Summary (Last 24 hours) at 05/18/2023 1417 Last data filed at 05/18/2023 0300 Gross per 24 hour  Intake 183.38 ml  Output --  Net 183.38 ml   Filed Weights   05/17/23 2329  Weight: 77.1 kg    Exam:  General:  Appears calm and comfortable and is in NAD, on RA Eyes:  EOMI, normal lids, iris ENT:  grossly normal hearing, lips & tongue, mmm Neck:  no LAD, masses or thyromegaly Cardiovascular:  RRR, no m/r/g. No LE edema.  Respiratory:   CTA bilaterally with no wheezes/rales/rhonchi.  Normal respiratory effort. Abdomen:  soft, NT, ND Skin:  no rash or induration seen on limited exam Musculoskeletal:  grossly normal tone BUE/BLE, good ROM, no bony abnormality Psychiatric:  blunted/somnolent mood and affect, speech fluent and appropriate, AOx3 Neurologic:  CN 2-12 grossly intact, moves all extremities in coordinated fashion  Data Reviewed: I have reviewed the patient's lab results since admission.  Pertinent labs for today include:   Normal ABG Na++ 132 CO2 20 Glucose 390, 373, 409, 384, 315 CK 54 HS troponin 3 Lipids: 263/40/175/239  WBC 11.5 A1c 7.9 TSH 0.860 Blood cultures pending     Family Communication: None present  Disposition: Status is: Inpatient Admit - It is my clinical opinion that admission to INPATIENT is reasonable and necessary because of the expectation that this patient will require hospital care that crosses at least 2 midnights to treat this condition based on the medical complexity of the problems presented.  Given the aforementioned information,  the predictability of an adverse outcome is felt to be significant.      Time spent: 50 minutes  Unresulted Labs (From admission, onward)     Start     Ordered   05/19/23 0500  CBC with Differential/Platelet  Tomorrow morning,   R        05/18/23 1417   05/19/23 0500  Basic metabolic panel  Tomorrow morning,   R        05/18/23 1417   05/18/23 0500  Lipoprotein A (LPA)  Tomorrow morning,   R        05/18/23 0007             Author: Jonah Blue, MD 05/18/2023 2:17 PM  For on call review [email or web address].

## 2023-05-18 NOTE — Assessment & Plan Note (Signed)
Chronic stable may be contributing to nausea vomiting would recommend patient stops

## 2023-05-18 NOTE — Plan of Care (Signed)

## 2023-05-18 NOTE — Assessment & Plan Note (Signed)
this patient has acute respiratory failure with Hypoxia   as documented by the presence of following: O2 saturatio< 90% on RA   Likely due to:  COPD exacerbation,  Provide O2 therapy and titrate as needed  Continuous pulse ox  check Pulse ox with ambulation prior to discharge   may need  TC consult for home O2 set up  flutter valve ordered  

## 2023-05-18 NOTE — Assessment & Plan Note (Addendum)
CP reproducible by palpation Somewhat atypical troponins unremarkable patient has risk factors such as new diagnosis of diabetes and tobacco abuse and hyperlipidemia. Order echogram in the morning if unremarkable would recommend outpatient follow-up with cardiology

## 2023-05-18 NOTE — Assessment & Plan Note (Signed)
-  -   Will initiate: Steroid taper  -  Antibiotics   Doxycycline, - Albuterol  PRN, - scheduled duoneb,  -  Breo or Dulera at discharge   -  Mucinex.  Titrate O2 to saturation >90%. Follow patients respiratory status.   influenza PCR neg  VBG ordered   Currently mentating well no evidence of symptomatic hypercarbia

## 2023-05-18 NOTE — Hospital Course (Signed)
57yo with h/o COPD, HTN, HLD, and DM who presented with SOB.  She was recently diagnosed with PNA and treated with Amoxil and then developed n/v/d within a day or two.  O2 sats were 88% on presentation but have been normal since and she is stable on room air.  Antibiotics changed to Doxy for COPD exacerbation, started on steroids and nebs.

## 2023-05-18 NOTE — Assessment & Plan Note (Signed)
Continue Pepcid and Carafate

## 2023-05-19 ENCOUNTER — Other Ambulatory Visit: Payer: Self-pay

## 2023-05-19 ENCOUNTER — Other Ambulatory Visit (HOSPITAL_COMMUNITY): Payer: Self-pay

## 2023-05-19 ENCOUNTER — Telehealth (HOSPITAL_COMMUNITY): Payer: Self-pay | Admitting: Pharmacy Technician

## 2023-05-19 DIAGNOSIS — F4323 Adjustment disorder with mixed anxiety and depressed mood: Secondary | ICD-10-CM

## 2023-05-19 DIAGNOSIS — J9601 Acute respiratory failure with hypoxia: Secondary | ICD-10-CM | POA: Diagnosis not present

## 2023-05-19 DIAGNOSIS — J441 Chronic obstructive pulmonary disease with (acute) exacerbation: Secondary | ICD-10-CM | POA: Diagnosis not present

## 2023-05-19 DIAGNOSIS — E1165 Type 2 diabetes mellitus with hyperglycemia: Secondary | ICD-10-CM | POA: Diagnosis not present

## 2023-05-19 LAB — GLUCOSE, CAPILLARY
Glucose-Capillary: 214 mg/dL — ABNORMAL HIGH (ref 70–99)
Glucose-Capillary: 157 mg/dL — ABNORMAL HIGH (ref 70–99)
Glucose-Capillary: 131 mg/dL — ABNORMAL HIGH (ref 70–99)

## 2023-05-19 LAB — CBC WITH DIFFERENTIAL/PLATELET
Abs Immature Granulocytes: 0.14 10*3/uL — ABNORMAL HIGH (ref 0.00–0.07)
Basophils Absolute: 0 10*3/uL (ref 0.0–0.1)
Basophils Relative: 0 %
Eosinophils Absolute: 0 10*3/uL (ref 0.0–0.5)
Eosinophils Relative: 0 %
HCT: 37.5 % (ref 36.0–46.0)
Hemoglobin: 12.2 g/dL (ref 12.0–15.0)
Immature Granulocytes: 1 %
Lymphocytes Relative: 20 %
Lymphs Abs: 3.7 10*3/uL (ref 0.7–4.0)
MCH: 31.4 pg (ref 26.0–34.0)
MCHC: 32.5 g/dL (ref 30.0–36.0)
MCV: 96.4 fL (ref 80.0–100.0)
Monocytes Absolute: 1 10*3/uL (ref 0.1–1.0)
Monocytes Relative: 6 %
Neutro Abs: 13.2 10*3/uL — ABNORMAL HIGH (ref 1.7–7.7)
Neutrophils Relative %: 73 %
Platelets: 298 10*3/uL (ref 150–400)
RBC: 3.89 MIL/uL (ref 3.87–5.11)
RDW: 12.8 % (ref 11.5–15.5)
WBC: 18 10*3/uL — ABNORMAL HIGH (ref 4.0–10.5)
nRBC: 0 % (ref 0.0–0.2)

## 2023-05-19 LAB — BASIC METABOLIC PANEL
Anion gap: 9 (ref 5–15)
BUN: 18 mg/dL (ref 6–20)
CO2: 19 mmol/L — ABNORMAL LOW (ref 22–32)
Calcium: 8.6 mg/dL — ABNORMAL LOW (ref 8.9–10.3)
Chloride: 109 mmol/L (ref 98–111)
Creatinine, Ser: 0.7 mg/dL (ref 0.44–1.00)
GFR, Estimated: >60 mL/min (ref >60)
Glucose, Bld: 192 mg/dL — ABNORMAL HIGH (ref 70–99)
Potassium: 4.3 mmol/L (ref 3.5–5.1)
Sodium: 137 mmol/L (ref 135–145)

## 2023-05-19 LAB — LIPOPROTEIN A (LPA): Lipoprotein (a): 20.4 nmol/L (ref <75.0)

## 2023-05-19 MED ORDER — IPRATROPIUM-ALBUTEROL 0.5-2.5 (3) MG/3ML IN SOLN
3.0000 mL | Freq: Four times a day (QID) | RESPIRATORY_TRACT | 2 refills | Status: AC | PRN
  Filled 2023-05-19: qty 1080, 90d supply, fill #0

## 2023-05-19 MED ORDER — INSULIN PEN NEEDLE 31G X 6 MM MISC
100.0000 [IU] | Freq: Three times a day (TID) | 3 refills | Status: AC
  Filled 2023-05-19: qty 100, 30d supply, fill #0

## 2023-05-19 MED ORDER — ACCU-CHEK GUIDE W/DEVICE KIT
1.0000 | PACK | Freq: Three times a day (TID) | 0 refills | Status: AC
  Filled 2023-05-19: qty 1, 30d supply, fill #0

## 2023-05-19 MED ORDER — ACCU-CHEK SOFTCLIX LANCETS MISC
1.0000 | Freq: Three times a day (TID) | 0 refills | Status: AC
  Filled 2023-05-19: qty 100, 30d supply, fill #0

## 2023-05-19 MED ORDER — GUAIFENESIN ER 600 MG PO TB12
600.0000 mg | ORAL_TABLET | Freq: Two times a day (BID) | ORAL | 0 refills | Status: DC | PRN
  Filled 2023-05-19: qty 20, 10d supply, fill #0

## 2023-05-19 MED ORDER — INSULIN ASPART 100 UNIT/ML FLEXPEN
PEN_INJECTOR | SUBCUTANEOUS | 11 refills | Status: AC
Start: 2023-05-19 — End: ?
  Filled 2023-05-19 (×2): qty 15, 56d supply, fill #0

## 2023-05-19 MED ORDER — DEXLANSOPRAZOLE 60 MG PO CPDR
1.0000 | DELAYED_RELEASE_CAPSULE | Freq: Every day | ORAL | 3 refills | Status: DC
  Filled 2023-05-19: qty 30, 30d supply, fill #0

## 2023-05-19 MED ORDER — METFORMIN HCL ER 500 MG PO TB24
500.0000 mg | ORAL_TABLET | Freq: Two times a day (BID) | ORAL | 2 refills | Status: DC
  Filled 2023-05-19: qty 60, 30d supply, fill #0

## 2023-05-19 MED ORDER — ALBUTEROL SULFATE HFA 108 (90 BASE) MCG/ACT IN AERS: 2.0000 | INHALATION_SPRAY | Freq: Four times a day (QID) | RESPIRATORY_TRACT | 2 refills | Status: DC | PRN

## 2023-05-19 MED ORDER — ATORVASTATIN CALCIUM 10 MG PO TABS
10.0000 mg | ORAL_TABLET | Freq: Every day | ORAL | 2 refills | Status: AC
  Filled 2023-05-19: qty 30, 30d supply, fill #0

## 2023-05-19 MED ORDER — DICYCLOMINE HCL 20 MG PO TABS: 20.0000 mg | ORAL_TABLET | Freq: Two times a day (BID) | ORAL | 0 refills | Status: AC

## 2023-05-19 MED ORDER — LANCET DEVICE MISC
1.0000 | Freq: Three times a day (TID) | 0 refills | Status: AC
  Filled 2023-05-19: qty 1, 30d supply, fill #0

## 2023-05-19 MED ORDER — HYDROXYZINE HCL 50 MG PO TABS
50.0000 mg | ORAL_TABLET | Freq: Every evening | ORAL | 0 refills | Status: AC | PRN
  Filled 2023-05-19 (×2): qty 30, 15d supply, fill #0

## 2023-05-19 MED ORDER — BLOOD GLUCOSE TEST VI STRP
1.0000 | ORAL_STRIP | Freq: Three times a day (TID) | 0 refills | Status: AC
  Filled 2023-05-19: qty 100, 30d supply, fill #0

## 2023-05-19 MED ORDER — PREDNISONE 20 MG PO TABS
40.0000 mg | ORAL_TABLET | Freq: Every day | ORAL | 0 refills | Status: AC
  Filled 2023-05-19: qty 10, 5d supply, fill #0

## 2023-05-19 MED ORDER — NICOTINE 14 MG/24HR TD PT24
14.0000 mg | MEDICATED_PATCH | Freq: Every day | TRANSDERMAL | 0 refills | Status: DC
  Filled 2023-05-19: qty 28, 28d supply, fill #0

## 2023-05-19 MED ORDER — SUCRALFATE 1 G PO TABS
1.0000 g | ORAL_TABLET | Freq: Two times a day (BID) | ORAL | 0 refills | Status: DC
  Filled 2023-05-19: qty 60, 30d supply, fill #0

## 2023-05-19 MED ORDER — GABAPENTIN 300 MG PO CAPS
300.0000 mg | ORAL_CAPSULE | Freq: Three times a day (TID) | ORAL | 3 refills | Status: AC
  Filled 2023-05-19: qty 90, 30d supply, fill #0

## 2023-05-19 MED ORDER — FAMOTIDINE 20 MG PO TABS: 20.0000 mg | ORAL_TABLET | Freq: Every day | ORAL | 3 refills | Status: DC

## 2023-05-19 MED ORDER — ROPINIROLE HCL 2 MG PO TABS
2.0000 mg | ORAL_TABLET | Freq: Two times a day (BID) | ORAL | 2 refills | Status: DC
  Filled 2023-05-19: qty 60, 30d supply, fill #0

## 2023-05-19 NOTE — Discharge Summary (Incomplete)
Physician Discharge Summary   Patient: Shawna [name] MRN: [id number] DOB: 1965-09-30  Admit date:     05/17/2023  Discharge date: {dischdate:26783}  Discharge Physician: Kathlen Mody   PCP: System, Provider Not In   Recommendations at discharge:  {Tip this will not be part of the note when signed- Example include specific recommendations for outpatient follow-up, pending tests to follow-up on. (Optional):26781}  ***  Discharge Diagnoses: Principal Problem:   Acute hypoxemic respiratory failure (HCC) Active Problems:   COPD exacerbation (HCC)   GERD (gastroesophageal reflux disease)   Cannabis use disorder, moderate, dependence (HCC)   Tobacco use disorder   Sedative, hypnotic or anxiolytic use disorder, severe, dependence (HCC)   Dyslipidemia   Adjustment disorder with mixed anxiety and depressed mood   DM2 (diabetes mellitus, type 2) (HCC)   Chest pain   COPD with acute exacerbation (HCC)  Resolved Problems:   * No resolved hospital problems. [facility] Course: 57yo with h/o COPD, HTN, HLD, and DM who presented with SOB.  She was recently diagnosed with PNA and treated with Amoxil and then developed n/v/d within a day or two.  O2 sats were 88% on presentation but have been normal since and she is stable on room air.  Antibiotics changed to Doxy for COPD exacerbation, started on steroids and nebs.  Assessment and Plan: No notes have been filed under this hospital service. Service: Hospitalist     {Tip this will not be part of the note when signed Body mass index is 27.44 kg/m. , ,  (Optional):26781}  {(NOTE) Pain control PDMP Statment (Optional):26782} Consultants: *** Procedures performed: ***  Disposition: {Plan; Disposition:26390} Diet recommendation:  Discharge Diet Orders (From admission, onward)     Start     Ordered   05/19/23 0000  Diet - low sodium heart healthy        05/19/23 0859           {Diet_Plan:26776} DISCHARGE MEDICATION: Allergies as  of 05/19/2023       Reactions   Haloperidol    Made pt "feel really weird".   Hydrocodone-acetaminophen Nausea And Vomiting, Nausea Only   Morphine Nausea And Vomiting   Patient states this medication makes her have sever flu symptoms.   Morphine And Codeine Nausea And Vomiting   Patient states this medication makes her have sever flu symptoms.   Pravastatin    CHEST PAIN   Pravastatin Sodium    Unknown    Promethazine Hcl    Unknown    Fentanyl Nausea And Vomiting, Other (See Comments)   Patient states this makes her have severe flu symptoms.   Propofol Nausea And Vomiting        Medication List     STOP taking these medications    amoxicillin 500 MG capsule Commonly known as: AMOXIL   dicyclomine 20 MG tablet Commonly known as: BENTYL   meloxicam 15 MG tablet Commonly known as: MOBIC   methocarbamol 500 MG tablet Commonly known as: ROBAXIN   methylPREDNISolone 4 MG Tbpk tablet Commonly known as: MEDROL DOSEPAK   naproxen 500 MG tablet Commonly known as: NAPROSYN       TAKE these medications    Accu-Chek Guide Test test strip Generic drug: glucose blood Use in the morning, at noon, and at bedtime.   Accu-Chek Guide w/Device Kit Use in the morning, at noon, and at bedtime.   Accu-Chek Softclix Lancets lancets Use in the morning, at noon, and at bedtime.   albuterol  108 (90 Base) MCG/ACT inhaler Commonly known as: VENTOLIN HFA Inhale 2 puffs into the lungs every 6 (six) hours as needed for wheezing or shortness of breath.   atorvastatin 10 MG tablet Commonly known as: LIPITOR Take 1 tablet (10 mg total) by mouth at bedtime.   clonazePAM 1 MG tablet Commonly known as: KLONOPIN Take 1 mg by mouth 2 (two) times daily as needed for anxiety.   Comfort EZ Pen Needles 31G X 6 MM Misc Generic drug: Insulin Pen Needle Use to inject insulin as directed 3 (three) times daily before meals.   dexlansoprazole 60 MG capsule Commonly known as:  DEXILANT Take 1 capsule (60 mg total) by mouth daily.   famotidine 20 MG tablet Commonly known as: PEPCID Take 1 tablet (20 mg total) by mouth daily.   gabapentin 300 MG capsule Commonly known as: Neurontin Take 1 capsule (300 mg total) by mouth 3 (three) times daily.   guaiFENesin 600 MG 12 hr tablet Commonly known as: MUCINEX Take 1 tablet (600 mg total) by mouth 2 (two) times daily as needed.   hydrOXYzine 50 MG tablet Commonly known as: ATARAX Take 1-2 tablets (50-100 mg total) by mouth at bedtime as needed for anxiety (sleep).   ipratropium-albuterol 0.5-2.5 (3) MG/3ML Soln Commonly known as: DUONEB Take 3 mLs by nebulization every 6 (six) hours as needed.   Lancet Device Misc 1 each by Does not apply route in the morning, at noon, and at bedtime. May substitute to any manufacturer covered by patient's insurance.   lidocaine 5 % ointment Commonly known as: XYLOCAINE Apply 1 Application topically daily as needed for mild pain (pain score 1-3) or moderate pain (pain score 4-6).   MAGNESIUM PO Take 1 tablet by mouth daily.   metFORMIN 500 MG 24 hr tablet Commonly known as: GLUCOPHAGE-XR Take 1 tablet (500 mg total) by mouth 2 (two) times daily with a meal.   nicotine 14 mg/24hr patch Commonly known as: NICODERM CQ - dosed in mg/24 hours Place 1 patch (14 mg total) onto the skin daily. Start taking on: May 20, 2023   NovoLOG FlexPen 100 UNIT/ML FlexPen Generic drug: insulin aspart Use as directed per sliding scale. CBG 70 - 120: 0 units. CBG 121 - 150: 1 unit. CBG 151 - 200: 2 units. CBG 201 - 250: 3 units. CBG 251 - 300: 5 units. CBG 301 - 350: 7 units. CBG 351 - 400: 9 units   ondansetron 8 MG disintegrating tablet Commonly known as: ZOFRAN-ODT Take 1 tablet (8 mg total) by mouth every 8 (eight) hours as needed for nausea or vomiting.   POTASSIUM PO Take 1 tablet by mouth daily.   predniSONE 20 MG tablet Commonly known as: DELTASONE Take 2 tablets (40 mg  total) by mouth daily with breakfast for 5 days.   rOPINIRole 2 MG tablet Commonly known as: REQUIP Take 1 tablet (2 mg total) by mouth in the morning and at bedtime. What changed: when to take this   sucralfate 1 g tablet Commonly known as: CARAFATE Take 1 tablet (1 g total) by mouth in the morning and at bedtime.        Discharge Exam: Filed Weights   05/17/23 2329  Weight: 77.1 kg   ***  Condition at discharge: {DC Condition:26389}  The results of significant diagnostics from this hospitalization (including imaging, microbiology, ancillary and laboratory) are listed below for reference.   Imaging Studies: ECHOCARDIOGRAM COMPLETE Result Date: 05/18/2023    ECHOCARDIOGRAM REPORT  Patient Name:   AMITA QUARLES Date of Exam: 05/18/2023 Medical Rec #:  [id number]    Height:       66.0 in Accession #:    [id number]   Weight:       170.0 lb Date of Birth:  December 29, 1965   BSA:          1.866 m Patient Age:    57 years     BP:           137/77 mmHg Patient Gender: F            HR:           76 bpm. Exam Location:  Inpatient Procedure: 2D Echo, Cardiac Doppler and Color Doppler Indications:    CP  History:        Patient has no prior history of Echocardiogram examinations.                 COPD, Signs/Symptoms:Chest Pain; Risk Factors:Dyslipidemia and                 Diabetes.  Sonographer:    Vern Claude Referring Phys: [id number] ANASTASSIA DOUTOVA IMPRESSIONS  1. Left ventricular ejection fraction, by estimation, is 60 to 65%. The left ventricle has normal function. Left ventricular endocardial border not optimally defined to evaluate regional wall motion. Left ventricular diastolic parameters are consistent with Grade I diastolic dysfunction (impaired relaxation).  2. Right ventricular systolic function is normal. The right ventricular size is normal.  3. The mitral valve is normal in structure. No evidence of mitral valve regurgitation. No evidence of mitral stenosis.  4. The aortic valve is normal  in structure. Aortic valve regurgitation is not visualized. No aortic stenosis is present.  5. The inferior vena cava is normal in size with greater than 50% respiratory variability, suggesting right atrial pressure of 3 mmHg. FINDINGS  Left Ventricle: Left ventricular ejection fraction, by estimation, is 60 to 65%. The left ventricle has normal function. Left ventricular endocardial border not optimally defined to evaluate regional wall motion. The left ventricular internal cavity size was normal in size. There is no left ventricular hypertrophy. Left ventricular diastolic parameters are consistent with Grade I diastolic dysfunction (impaired relaxation). Right Ventricle: The right ventricular size is normal. No increase in right ventricular wall thickness. Right ventricular systolic function is normal. Left Atrium: Left atrial size was normal in size. Right Atrium: Right atrial size was normal in size. Pericardium: There is no evidence of pericardial effusion. Mitral Valve: The mitral valve is normal in structure. No evidence of mitral valve regurgitation. No evidence of mitral valve stenosis. Tricuspid Valve: The tricuspid valve is normal in structure. Tricuspid valve regurgitation is not demonstrated. No evidence of tricuspid stenosis. Aortic Valve: The aortic valve is normal in structure. Aortic valve regurgitation is not visualized. No aortic stenosis is present. Aortic valve mean gradient measures 2.0 mmHg. Aortic valve peak gradient measures 4.3 mmHg. Aortic valve area, by VTI measures 2.30 cm. Pulmonic Valve: The pulmonic valve was normal in structure. Pulmonic valve regurgitation is mild. No evidence of pulmonic stenosis. Aorta: The aortic root is normal in size and structure. Venous: The inferior vena cava is normal in size with greater than 50% respiratory variability, suggesting right atrial pressure of 3 mmHg. IAS/Shunts: No atrial level shunt detected by color flow Doppler.  LEFT VENTRICLE PLAX 2D  LVIDd:         4.50 cm     Diastology LVIDs:  2.80 cm     LV e' medial:    7.18 cm/s LV PW:         0.80 cm     LV E/e' medial:  6.1 LV IVS:        0.80 cm     LV e' lateral:   7.51 cm/s LVOT diam:     1.80 cm     LV E/e' lateral: 5.9 LV SV:         40 LV SV Index:   22 LVOT Area:     2.54 cm  LV Volumes (MOD) LV vol d, MOD A2C: 68.8 ml LV vol d, MOD A4C: 78.2 ml LV vol s, MOD A2C: 24.8 ml LV vol s, MOD A4C: 35.6 ml LV SV MOD A2C:     44.0 ml LV SV MOD A4C:     78.2 ml LV SV MOD BP:      45.5 ml RIGHT VENTRICLE            IVC RV Basal diam:  3.50 cm    IVC diam: 1.40 cm RV Mid diam:    2.40 cm RV S prime:     8.38 cm/s TAPSE (M-mode): 2.1 cm LEFT ATRIUM             Index        RIGHT ATRIUM           Index LA Vol (A2C):   20.4 ml 10.93 ml/m  RA Area:     10.10 cm LA Vol (A4C):   10.5 ml 5.63 ml/m   RA Volume:   20.00 ml  10.72 ml/m LA Biplane Vol: 15.0 ml 8.04 ml/m  AORTIC VALVE                    PULMONIC VALVE AV Area (Vmax):    1.85 cm     PV Vmax:          0.73 m/s AV Area (Vmean):   1.77 cm     PV Peak grad:     2.1 mmHg AV Area (VTI):     2.30 cm     PR End Diast Vel: 1.99 msec AV Vmax:           104.00 cm/s AV Vmean:          69.600 cm/s AV VTI:            0.176 m AV Peak Grad:      4.3 mmHg AV Mean Grad:      2.0 mmHg LVOT Vmax:         75.70 cm/s LVOT Vmean:        48.500 cm/s LVOT VTI:          0.159 m LVOT/AV VTI ratio: 0.90  AORTA Ao Root diam: 3.00 cm Ao Asc diam:  2.90 cm MITRAL VALVE MV Area (PHT): 4.39 cm    SHUNTS MV Decel Time: 173 msec    Systemic VTI:  0.16 m MV E velocity: 44.10 cm/s  Systemic Diam: 1.80 cm MV A velocity: 55.70 cm/s MV E/A ratio:  0.79 Mihai Croitoru MD Electronically signed by Thurmon Fair MD Signature Date/Time: 05/18/2023/2:33:46 PM    Final    CT ABDOMEN PELVIS W CONTRAST Result Date: 05/17/2023 CLINICAL DATA:  Left lower quadrant pain, nausea, vomiting EXAM: CT ABDOMEN AND PELVIS WITH CONTRAST TECHNIQUE: Multidetector CT imaging of the abdomen and  pelvis was performed using the standard protocol following bolus administration of  intravenous contrast. RADIATION DOSE REDUCTION: This exam was performed according to the departmental dose-optimization program which includes automated exposure control, adjustment of the mA and/or kV according to patient size and/or use of iterative reconstruction technique. CONTRAST:  OMNIPAQUE IOHEXOL 350 MG/ML SOLN COMPARISON:  None Available. FINDINGS: Lower chest: No acute findings. Hepatobiliary: No focal hepatic abnormality. Gallbladder unremarkable. Pancreas: No focal abnormality or ductal dilatation. Spleen: No focal abnormality.  Normal size. Adrenals/Urinary Tract: No adrenal abnormality. No focal renal abnormality. No stones or hydronephrosis. Urinary bladder is unremarkable. Stomach/Bowel: Normal appendix. Stomach, large and small bowel grossly unremarkable. Vascular/Lymphatic: No evidence of aneurysm or adenopathy. Reproductive: Prior hysterectomy.  No adnexal masses. Other: No free fluid or free air. Musculoskeletal: No acute bony abnormality. IMPRESSION: No acute findings in the abdomen or pelvis. Electronically Signed   By: Charlett Nose M.D.   On: 05/17/2023 18:14   CT Angio Chest PE W and/or Wo Contrast Result Date: 05/17/2023 CLINICAL DATA:  Pulmonary embolism (PE) suspected, high prob. Shortness of breath. EXAM: CT ANGIOGRAPHY CHEST WITH CONTRAST TECHNIQUE: Multidetector CT imaging of the chest was performed using the standard protocol during bolus administration of intravenous contrast. Multiplanar CT image reconstructions and MIPs were obtained to evaluate the vascular anatomy. RADIATION DOSE REDUCTION: This exam was performed according to the departmental dose-optimization program which includes automated exposure control, adjustment of the mA and/or kV according to patient size and/or use of iterative reconstruction technique. CONTRAST:  OMNIPAQUE IOHEXOL 350 MG/ML SOLN COMPARISON:  01/23/2015.   Chest x-ray today. FINDINGS: Cardiovascular: No filling defects in the pulmonary arteries to suggest pulmonary emboli. Heart is normal size. Aorta is normal caliber. Mediastinum/Nodes: No mediastinal, hilar, or axillary adenopathy. Trachea and esophagus are unremarkable. Thyroid unremarkable. Lungs/Pleura: Moderate centrilobular and paraseptal emphysema. No confluent opacities or effusions. Upper Abdomen: No acute findings Musculoskeletal: Chest wall soft tissues are unremarkable. No acute bony abnormality. Review of the MIP images confirms the above findings. IMPRESSION: No evidence of pulmonary embolus. No acute cardiopulmonary disease. Emphysema (ICD10-J43.9). Electronically Signed   By: Charlett Nose M.D.   On: 05/17/2023 18:13   DG Chest 2 View Result Date: 05/17/2023 CLINICAL DATA:  Shortness of breath EXAM: CHEST - 2 VIEW COMPARISON:  05/10/2023 FINDINGS: Emphysematous/chronic changes within the lungs. No acute confluent opacities or effusions. Heart and mediastinal contours within normal limits. No acute bony abnormality. IMPRESSION: COPD/chronic changes. No active disease. Electronically Signed   By: Charlett Nose M.D.   On: 05/17/2023 17:20   DG Chest 2 View Result Date: 05/10/2023 CLINICAL DATA:  Chest pain, cough and shortness of breath.  Smoker. EXAM: CHEST - 2 VIEW COMPARISON:  10/13/2022.  Abdomen and pelvis CT dated 01/16/2023. FINDINGS: Normal sized heart. Small amount of persistent pleural and parenchymal scarring and bullous change at the left lung base. The lungs remain mildly hyperexpanded with mild diffuse peribronchial thickening. Old, healed right rib fracture. Right shoulder prosthesis. IMPRESSION: 1. Stable mild changes of COPD and chronic bronchitis. 2. No acute abnormality. Electronically Signed   By: Beckie Salts M.D.   On: 05/10/2023 22:06    Microbiology: Results for orders placed or performed during the hospital encounter of 05/17/23  Resp panel by RT-PCR (RSV, Flu A&B,  Covid) Urine, Clean Catch     Status: None   Collection Time: 05/17/23  5:07 PM   Specimen: Urine, Clean Catch; Nasal Swab  Result Value Ref Range Status   SARS Coronavirus 2 by RT PCR NEGATIVE NEGATIVE Final  Comment: (NOTE) SARS-CoV-2 target nucleic acids are NOT DETECTED.  The SARS-CoV-2 RNA is generally detectable in upper respiratory specimens during the acute phase of infection. The lowest concentration of SARS-CoV-2 viral copies this assay can detect is 138 copies/mL. A negative result does not preclude SARS-Cov-2 infection and should not be used as the sole basis for treatment or other patient management decisions. A negative result may occur with  improper specimen collection/handling, submission of specimen other than nasopharyngeal swab, presence of viral mutation(s) within the areas targeted by this assay, and inadequate number of viral copies(<138 copies/mL). A negative result must be combined with clinical observations, patient history, and epidemiological information. The expected result is Negative.  Fact Sheet for Patients:  BloggerCourse.com  Fact Sheet for Healthcare Providers:  SeriousBroker.it  This test is no t yet approved or cleared by the [place] FDA and  has been authorized for detection and/or diagnosis of SARS-CoV-2 by FDA under an Emergency Use Authorization (EUA). This EUA will remain  in effect (meaning this test can be used) for the duration of the COVID-19 declaration under Section 564(b)(1) of the Act, 21 U.S.C.section 360bbb-3(b)(1), unless the authorization is terminated  or revoked sooner.       Influenza A by PCR NEGATIVE NEGATIVE Final   Influenza B by PCR NEGATIVE NEGATIVE Final    Comment: (NOTE) The Xpert Xpress SARS-CoV-2/FLU/RSV plus assay is intended as an aid in the diagnosis of influenza from Nasopharyngeal swab specimens and should not be used as a sole basis for  treatment. Nasal washings and aspirates are unacceptable for Xpert Xpress SARS-CoV-2/FLU/RSV testing.  Fact Sheet for Patients: BloggerCourse.com  Fact Sheet for Healthcare Providers: SeriousBroker.it  This test is not yet approved or cleared by the [place] FDA and has been authorized for detection and/or diagnosis of SARS-CoV-2 by FDA under an Emergency Use Authorization (EUA). This EUA will remain in effect (meaning this test can be used) for the duration of the COVID-19 declaration under Section 564(b)(1) of the Act, 21 U.S.C. section 360bbb-3(b)(1), unless the authorization is terminated or revoked.     Resp Syncytial Virus by PCR NEGATIVE NEGATIVE Final    Comment: (NOTE) Fact Sheet for Patients: BloggerCourse.com  Fact Sheet for Healthcare Providers: SeriousBroker.it  This test is not yet approved or cleared by the [place] FDA and has been authorized for detection and/or diagnosis of SARS-CoV-2 by FDA under an Emergency Use Authorization (EUA). This EUA will remain in effect (meaning this test can be used) for the duration of the COVID-19 declaration under Section 564(b)(1) of the Act, 21 U.S.C. section 360bbb-3(b)(1), unless the authorization is terminated or revoked.  Performed at [facility], [address] [place], [place] [address]   Blood Culture (routine x 2)     Status: None (Preliminary result)   Collection Time: 05/17/23  5:32 PM   Specimen: BLOOD  Result Value Ref Range Status   Specimen Description   Final    BLOOD RIGHT ANTECUBITAL Performed at [facility], [address] [place], [place] [address]    Special Requests   Final    BOTTLES DRAWN AEROBIC AND ANAEROBIC Blood Culture adequate volume Performed at [facility], [address] [place], [place] [address]    Culture    Final    NO GROWTH 2 DAYS Performed at [facility], [address], [place], [place] [address]    Report Status PENDING  Incomplete  Blood Culture (routine x 2)  Status: None (Preliminary result)   Collection Time: 05/18/23  1:04 AM   Specimen: BLOOD  Result Value Ref Range Status   Specimen Description   Final    BLOOD BLOOD LEFT HAND Performed at [facility], [address], [place], [place] [address]    Special Requests   Final    BOTTLES DRAWN AEROBIC AND ANAEROBIC Blood Culture results may not be optimal due to an inadequate volume of blood received in culture bottles Performed at [facility], [address] [place], [place] [address]    Culture   Final    NO GROWTH 1 DAY Performed at [facility], [address], [place], [place] [address]    Report Status PENDING  Incomplete  MRSA Next Gen by PCR, Nasal     Status: None   Collection Time: 05/18/23  1:50 AM   Specimen: Nasal Mucosa; Nasal Swab  Result Value Ref Range Status   MRSA by PCR Next Gen NOT DETECTED NOT DETECTED Final    Comment: (NOTE) The GeneXpert MRSA Assay (FDA approved for NASAL specimens only), is one component of a comprehensive MRSA colonization surveillance program. It is not intended to diagnose MRSA infection nor to guide or monitor treatment for MRSA infections. Test performance is not FDA approved in patients less than 64 years old. Performed at [facility], [address], [place], [place] [address]     Labs: CBC: Recent Labs  Lab 05/17/23 1602 05/18/23 0104 05/19/23 0601  WBC 13.3* 11.5* 18.0*  NEUTROABS  --   --  13.2*  HGB 13.6 13.2 12.2  HCT 39.3 39.8 37.5  MCV 88.7 93.2 96.4  PLT 383 362 298   Basic Metabolic Panel: Recent Labs  Lab 05/17/23 1602 05/18/23 0104 05/19/23 0601  NA 137 132* 137  K 3.7 4.5 4.3  CL 103 102 109  CO2 23 20* 19*  GLUCOSE 253* 390* 192*  BUN 17 17 18   CREATININE 0.87  0.95 0.70  CALCIUM 9.1 8.9 8.6*  MG  --  2.2  --   PHOS  --  3.0  --    Liver Function Tests: Recent Labs  Lab 05/17/23 1602 05/18/23 0104  AST 14* 16  ALT 14 18  ALKPHOS 96 83  BILITOT 0.7 0.8  PROT 6.5 6.9  ALBUMIN 4.1 3.7   CBG: Recent Labs  Lab 05/18/23 2031 05/18/23 2323 05/19/23 0413 05/19/23 0736 05/19/23 1119  GLUCAP 250* 187* 214* 157* 131*    Discharge time spent: {LESS THAN/GREATER THAN:26388} 30 minutes.  Signed: Kathlen Mody, MD [facility] 05/19/2023

## 2023-05-19 NOTE — Plan of Care (Signed)

## 2023-05-19 NOTE — Telephone Encounter (Signed)
Pharmacy Patient Advocate Encounter   Received notification  that prior authorization for Dexcom G7 Sensor is required/requested.   Insurance verification completed.   The patient is insured through [facility] .   Per test claim: PA required; PA submitted to above mentioned insurance via CoverMyMeds Key/confirmation #/EOC [id number] Status is pending

## 2023-05-19 NOTE — Progress Notes (Signed)
OT Cancellation Note  Patient Details Name: Shawna [name] MRN: [id number] DOB: [birth year]   Cancelled Treatment:    Reason Eval/Treat Not Completed: Other (comment) (Nursing reported ambulating withour asist and does not see any furth OT needs. Please reach out if anything changes.)  Presley Raddle OTR/L  Acute Rehab Services  [phone] office number  Alphia Moh 05/19/2023, 9:45 AM

## 2023-05-19 NOTE — Progress Notes (Signed)
PT Cancellation Note  Patient Details Name: Shawna [name] MRN: [id number] DOB: 1965-10-10   Cancelled Treatment:    Reason Eval/Treat Not Completed: PT screened, no needs identified, will sign off  Patient  independent. , has A rollator PRN.  Blanchard Kelch PT Acute Rehabilitation Services Office [phone] Weekend [phone]  Rada Hay 05/19/2023, 9:01 AM

## 2023-05-19 NOTE — Discharge Planning (Signed)
Upon entering patient room after report from the night RN, day shift RN entered room and pt appeared to very anxious and restless. Pt is concerned about discharging and being medically cleared. Patient stated " I want to go home now! If I don't go home soon, I will lose my temper!"   Rn attempted to reassure patient that doctor will come and assess for discharge planning. RN contacted MD about patient's concerns, MD confirmed that they will see the patient today.

## 2023-05-19 NOTE — TOC Transition Note (Signed)
Transition of Care [facility]) - Discharge Note   Patient Details  Name: Shawna [name] MRN: [id number] Date of Birth: May 05, 1965  Transition of Care [facility]) CM/SW Contact:  Otelia Santee, LCSW Phone Number: 05/19/2023, 10:13 AM   Clinical Narrative:    Met with pt for concerns with housing, PCP, and medication assistance. Pt shares she has been homeless for the past year. Pt has been staying with her daughter's boyfriend. Pt receives $964 in SSI. PT also receives $180 in EBT. Pt has active Medicaid and she reports having a name of PCP assigned to her at home. CSW encouraged pt to call to schedule an appointment with Medicaid assigned PCP. Pt agreeable to resources for housing and financial assistance placed on DC paperwork. Resources have been added. Pt has car that is parked at [employer]. Pt does not have transportation to car. Taxi voucher provided for transport to car at discharge. Ride waiver signed and placed in shadow chart.    Final next level of care: Home/Self Care Barriers to Discharge: No Barriers Identified   Patient Goals and CMS Choice Patient states their goals for this hospitalization and ongoing recovery are:: To go home CMS Medicare.gov Compare Post Acute Care list provided to::  (NA) Choice offered to / list presented to :  (NA) [place] ownership interest in [facility].provided to::  (NA)    Discharge Placement                       Discharge Plan and Services Additional resources added to the After Visit Summary for                  DME Arranged: N/A DME Agency: NA                  Social Drivers of Health (SDOH) Interventions SDOH Screenings   Food Insecurity: No Food Insecurity (05/17/2023)  Housing: Low Risk  (05/17/2023)  Transportation Needs: No Transportation Needs (05/17/2023)  Utilities: Not At Risk (05/17/2023)  Alcohol Screen: Low Risk  (03/01/2017)  Tobacco Use: High Risk (05/17/2023)     Readmission Risk  Interventions    05/19/2023   10:12 AM  Readmission Risk Prevention Plan  Transportation Screening Complete  PCP or Specialist Appt within 3-5 Days Complete  HRI or Home Care Consult Complete  Social Work Consult for Recovery Care Planning/Counseling Complete  Palliative Care Screening Not Applicable  Medication Review Oceanographer) Referral to Pharmacy

## 2023-05-20 NOTE — Discharge Summary (Signed)
Physician Discharge Summary   Patient: Shawna [name] MRN: [id number] DOB: [birth year]  Admit date:     05/17/2023  Discharge date: 05/19/2023  Discharge Physician: Kathlen Mody   PCP: System, Provider Not In   Recommendations at discharge:  Please follow up with PCP in one week Please follow up with cbc and bMP in one week.  Recommend outpatient follow up with cardiology.  Discharge Diagnoses: Principal Problem:   Acute hypoxemic respiratory failure (HCC) Active Problems:   COPD exacerbation (HCC)   GERD (gastroesophageal reflux disease)   Cannabis use disorder, moderate, dependence (HCC)   Tobacco use disorder   Sedative, hypnotic or anxiolytic use disorder, severe, dependence (HCC)   Dyslipidemia   Adjustment disorder with mixed anxiety and depressed mood   DM2 (diabetes mellitus, type 2) (HCC)   Chest pain   COPD with acute exacerbation (HCC)  Resolved Problems:   * No resolved hospital problems. [facility] Course: 57yo with h/o COPD, HTN, HLD, and DM who presented with SOB.  She was recently diagnosed with PNA and treated with Amoxil and then developed n/v/d within a day or two.  O2 sats were 88% on presentation but have been normal since and she is stable on room air.  Antibiotics changed to Doxy for COPD exacerbation, started on steroids and nebs.  Assessment and Plan:    Generalized weakness Patient was recently diagnosed with CAP, treated with abx, got diarrhea She reports that n/v/d has resolved but she continues to feel generally weak She is not on home O2, had O2 sat 88% on presentation but has had no O2 requirement since She reports that the PNA is resolved and CXR/CTA without evidence of PNA She does have COPD on imaging    Chest pain CP reproducible by palpation Has CVD risk factors such as new-onset diabetes, tobacco abuse and hyperlipidemia Troponins negative x 2 Likely needs outpatient follow-up with cardiology.    Adjustment disorder with mixed  anxiety and depressed mood Resume home meds.    Cannabis use disorder, moderate, dependence  Chronic/stable but may be contributing to n/v; cessation encouraged   Dyslipidemia Continue atorvastatin   GERD (gastroesophageal reflux disease) Continue Pepcid and Carafate Reports early satiety; if not improving, consider GI consult   Tobacco use disorder Encourage cessation.   This was discussed with the patient and should be reviewed on an ongoing basis.   Patch ordered at patient request.    DM2 (diabetes mellitus, type 2)  Denies history of DM A1c is 7.9,  Discharged on metformin and novolog flex pen.         Consultants: none.  Procedures performed: none.   Disposition: Home Diet recommendation:  Discharge Diet Orders (From admission, onward)     Start     Ordered   05/19/23 0000  Diet - low sodium heart healthy        05/19/23 0859           Carb modified diet DISCHARGE MEDICATION: Allergies as of 05/19/2023       Reactions   Haloperidol    Made pt "feel really weird".   Hydrocodone-acetaminophen Nausea And Vomiting, Nausea Only   Morphine Nausea And Vomiting   Patient states this medication makes her have sever flu symptoms.   Morphine And Codeine Nausea And Vomiting   Patient states this medication makes her have sever flu symptoms.   Pravastatin    CHEST PAIN   Pravastatin Sodium    Unknown    Promethazine  Hcl    Unknown    Fentanyl Nausea And Vomiting, Other (See Comments)   Patient states this makes her have severe flu symptoms.   Propofol Nausea And Vomiting        Medication List     STOP taking these medications    amoxicillin 500 MG capsule Commonly known as: AMOXIL   dicyclomine 20 MG tablet Commonly known as: BENTYL   meloxicam 15 MG tablet Commonly known as: MOBIC   methocarbamol 500 MG tablet Commonly known as: ROBAXIN   methylPREDNISolone 4 MG Tbpk tablet Commonly known as: MEDROL DOSEPAK   naproxen 500 MG  tablet Commonly known as: NAPROSYN       TAKE these medications    Accu-Chek Guide Test test strip Generic drug: glucose blood Use in the morning, at noon, and at bedtime.   Accu-Chek Guide w/Device Kit Use in the morning, at noon, and at bedtime.   Accu-Chek Softclix Lancets lancets Use in the morning, at noon, and at bedtime.   albuterol 108 (90 Base) MCG/ACT inhaler Commonly known as: VENTOLIN HFA Inhale 2 puffs into the lungs every 6 (six) hours as needed for wheezing or shortness of breath.   atorvastatin 10 MG tablet Commonly known as: LIPITOR Take 1 tablet (10 mg total) by mouth at bedtime.   clonazePAM 1 MG tablet Commonly known as: KLONOPIN Take 1 mg by mouth 2 (two) times daily as needed for anxiety.   Comfort EZ Pen Needles 31G X 6 MM Misc Generic drug: Insulin Pen Needle Use to inject insulin as directed 3 (three) times daily before meals.   dexlansoprazole 60 MG capsule Commonly known as: DEXILANT Take 1 capsule (60 mg total) by mouth daily.   famotidine 20 MG tablet Commonly known as: PEPCID Take 1 tablet (20 mg total) by mouth daily.   gabapentin 300 MG capsule Commonly known as: Neurontin Take 1 capsule (300 mg total) by mouth 3 (three) times daily.   guaiFENesin 600 MG 12 hr tablet Commonly known as: MUCINEX Take 1 tablet (600 mg total) by mouth 2 (two) times daily as needed.   hydrOXYzine 50 MG tablet Commonly known as: ATARAX Take 1-2 tablets (50-100 mg total) by mouth at bedtime as needed for anxiety (sleep).   ipratropium-albuterol 0.5-2.5 (3) MG/3ML Soln Commonly known as: DUONEB Take 3 mLs by nebulization every 6 (six) hours as needed.   Lancet Device Misc 1 each by Does not apply route in the morning, at noon, and at bedtime. May substitute to any manufacturer covered by patient's insurance.   lidocaine 5 % ointment Commonly known as: XYLOCAINE Apply 1 Application topically daily as needed for mild pain (pain score 1-3) or  moderate pain (pain score 4-6).   MAGNESIUM PO Take 1 tablet by mouth daily.   metFORMIN 500 MG 24 hr tablet Commonly known as: GLUCOPHAGE-XR Take 1 tablet (500 mg total) by mouth 2 (two) times daily with a meal.   nicotine 14 mg/24hr patch Commonly known as: NICODERM CQ - dosed in mg/24 hours Place 1 patch (14 mg total) onto the skin daily.   NovoLOG FlexPen 100 UNIT/ML FlexPen Generic drug: insulin aspart Use as directed per sliding scale. CBG 70 - 120: 0 units. CBG 121 - 150: 1 unit. CBG 151 - 200: 2 units. CBG 201 - 250: 3 units. CBG 251 - 300: 5 units. CBG 301 - 350: 7 units. CBG 351 - 400: 9 units   ondansetron 8 MG disintegrating tablet Commonly known as: ZOFRAN-ODT  Take 1 tablet (8 mg total) by mouth every 8 (eight) hours as needed for nausea or vomiting.   POTASSIUM PO Take 1 tablet by mouth daily.   predniSONE 20 MG tablet Commonly known as: DELTASONE Take 2 tablets (40 mg total) by mouth daily with breakfast for 5 days.   rOPINIRole 2 MG tablet Commonly known as: REQUIP Take 1 tablet (2 mg total) by mouth in the morning and at bedtime. What changed: when to take this   sucralfate 1 g tablet Commonly known as: CARAFATE Take 1 tablet (1 g total) by mouth in the morning and at bedtime.        Discharge Exam: Filed Weights   05/17/23 2329  Weight: 77.1 kg   General exam: Appears calm and comfortable  Respiratory system: Clear to auscultation. Respiratory effort normal. Cardiovascular system: S1 & S2 heard, RRR. No JVD, murmurs, rubs, gallops or clicks. No pedal edema. Gastrointestinal system: Abdomen is nondistended, soft and nontender. No organomegaly or masses felt. Normal bowel sounds heard. Central nervous system: Alert and oriented. No focal neurological deficits. Extremities: Symmetric 5 x 5 power. Skin: No rashes, lesions or ulcers Psychiatry: Judgement and insight appear normal. Mood & affect appropriate.    Condition at discharge: fair  The  results of significant diagnostics from this hospitalization (including imaging, microbiology, ancillary and laboratory) are listed below for reference.   Imaging Studies: ECHOCARDIOGRAM COMPLETE Result Date: 05/18/2023    ECHOCARDIOGRAM REPORT   Patient Name:   Shawna [name] Date of Exam: 05/18/2023 Medical Rec #:  [id number]    Height:       66.0 in Accession #:    [id number]   Weight:       170.0 lb Date of Birth:  Feb 25, 1966   BSA:          1.866 m Patient Age:    57 years     BP:           137/77 mmHg Patient Gender: F            HR:           76 bpm. Exam Location:  Inpatient Procedure: 2D Echo, Cardiac Doppler and Color Doppler Indications:    CP  History:        Patient has no prior history of Echocardiogram examinations.                 COPD, Signs/Symptoms:Chest Pain; Risk Factors:Dyslipidemia and                 Diabetes.  Sonographer:    Vern Claude Referring Phys: [id number] ANASTASSIA DOUTOVA IMPRESSIONS  1. Left ventricular ejection fraction, by estimation, is 60 to 65%. The left ventricle has normal function. Left ventricular endocardial border not optimally defined to evaluate regional wall motion. Left ventricular diastolic parameters are consistent with Grade I diastolic dysfunction (impaired relaxation).  2. Right ventricular systolic function is normal. The right ventricular size is normal.  3. The mitral valve is normal in structure. No evidence of mitral valve regurgitation. No evidence of mitral stenosis.  4. The aortic valve is normal in structure. Aortic valve regurgitation is not visualized. No aortic stenosis is present.  5. The inferior vena cava is normal in size with greater than 50% respiratory variability, suggesting right atrial pressure of 3 mmHg. FINDINGS  Left Ventricle: Left ventricular ejection fraction, by estimation, is 60 to 65%. The left ventricle has normal function. Left ventricular endocardial border not optimally defined  to evaluate regional wall motion. The left  ventricular internal cavity size was normal in size. There is no left ventricular hypertrophy. Left ventricular diastolic parameters are consistent with Grade I diastolic dysfunction (impaired relaxation). Right Ventricle: The right ventricular size is normal. No increase in right ventricular wall thickness. Right ventricular systolic function is normal. Left Atrium: Left atrial size was normal in size. Right Atrium: Right atrial size was normal in size. Pericardium: There is no evidence of pericardial effusion. Mitral Valve: The mitral valve is normal in structure. No evidence of mitral valve regurgitation. No evidence of mitral valve stenosis. Tricuspid Valve: The tricuspid valve is normal in structure. Tricuspid valve regurgitation is not demonstrated. No evidence of tricuspid stenosis. Aortic Valve: The aortic valve is normal in structure. Aortic valve regurgitation is not visualized. No aortic stenosis is present. Aortic valve mean gradient measures 2.0 mmHg. Aortic valve peak gradient measures 4.3 mmHg. Aortic valve area, by VTI measures 2.30 cm. Pulmonic Valve: The pulmonic valve was normal in structure. Pulmonic valve regurgitation is mild. No evidence of pulmonic stenosis. Aorta: The aortic root is normal in size and structure. Venous: The inferior vena cava is normal in size with greater than 50% respiratory variability, suggesting right atrial pressure of 3 mmHg. IAS/Shunts: No atrial level shunt detected by color flow Doppler.  LEFT VENTRICLE PLAX 2D LVIDd:         4.50 cm     Diastology LVIDs:         2.80 cm     LV e' medial:    7.18 cm/s LV PW:         0.80 cm     LV E/e' medial:  6.1 LV IVS:        0.80 cm     LV e' lateral:   7.51 cm/s LVOT diam:     1.80 cm     LV E/e' lateral: 5.9 LV SV:         40 LV SV Index:   22 LVOT Area:     2.54 cm  LV Volumes (MOD) LV vol d, MOD A2C: 68.8 ml LV vol d, MOD A4C: 78.2 ml LV vol s, MOD A2C: 24.8 ml LV vol s, MOD A4C: 35.6 ml LV SV MOD A2C:     44.0 ml LV SV  MOD A4C:     78.2 ml LV SV MOD BP:      45.5 ml RIGHT VENTRICLE            IVC RV Basal diam:  3.50 cm    IVC diam: 1.40 cm RV Mid diam:    2.40 cm RV S prime:     8.38 cm/s TAPSE (M-mode): 2.1 cm LEFT ATRIUM             Index        RIGHT ATRIUM           Index LA Vol (A2C):   20.4 ml 10.93 ml/m  RA Area:     10.10 cm LA Vol (A4C):   10.5 ml 5.63 ml/m   RA Volume:   20.00 ml  10.72 ml/m LA Biplane Vol: 15.0 ml 8.04 ml/m  AORTIC VALVE                    PULMONIC VALVE AV Area (Vmax):    1.85 cm     PV Vmax:          0.73 m/s AV Area (Vmean):  1.77 cm     PV Peak grad:     2.1 mmHg AV Area (VTI):     2.30 cm     PR End Diast Vel: 1.99 msec AV Vmax:           104.00 cm/s AV Vmean:          69.600 cm/s AV VTI:            0.176 m AV Peak Grad:      4.3 mmHg AV Mean Grad:      2.0 mmHg LVOT Vmax:         75.70 cm/s LVOT Vmean:        48.500 cm/s LVOT VTI:          0.159 m LVOT/AV VTI ratio: 0.90  AORTA Ao Root diam: 3.00 cm Ao Asc diam:  2.90 cm MITRAL VALVE MV Area (PHT): 4.39 cm    SHUNTS MV Decel Time: 173 msec    Systemic VTI:  0.16 m MV E velocity: 44.10 cm/s  Systemic Diam: 1.80 cm MV A velocity: 55.70 cm/s MV E/A ratio:  0.79 Mihai Croitoru MD Electronically signed by Thurmon Fair MD Signature Date/Time: 05/18/2023/2:33:46 PM    Final    CT ABDOMEN PELVIS W CONTRAST Result Date: 05/17/2023 CLINICAL DATA:  Left lower quadrant pain, nausea, vomiting EXAM: CT ABDOMEN AND PELVIS WITH CONTRAST TECHNIQUE: Multidetector CT imaging of the abdomen and pelvis was performed using the standard protocol following bolus administration of intravenous contrast. RADIATION DOSE REDUCTION: This exam was performed according to the departmental dose-optimization program which includes automated exposure control, adjustment of the mA and/or kV according to patient size and/or use of iterative reconstruction technique. CONTRAST:  OMNIPAQUE IOHEXOL 350 MG/ML SOLN COMPARISON:  None Available. FINDINGS: Lower chest: No  acute findings. Hepatobiliary: No focal hepatic abnormality. Gallbladder unremarkable. Pancreas: No focal abnormality or ductal dilatation. Spleen: No focal abnormality.  Normal size. Adrenals/Urinary Tract: No adrenal abnormality. No focal renal abnormality. No stones or hydronephrosis. Urinary bladder is unremarkable. Stomach/Bowel: Normal appendix. Stomach, large and small bowel grossly unremarkable. Vascular/Lymphatic: No evidence of aneurysm or adenopathy. Reproductive: Prior hysterectomy.  No adnexal masses. Other: No free fluid or free air. Musculoskeletal: No acute bony abnormality. IMPRESSION: No acute findings in the abdomen or pelvis. Electronically Signed   By: Charlett Nose M.D.   On: 05/17/2023 18:14   CT Angio Chest PE W and/or Wo Contrast Result Date: 05/17/2023 CLINICAL DATA:  Pulmonary embolism (PE) suspected, high prob. Shortness of breath. EXAM: CT ANGIOGRAPHY CHEST WITH CONTRAST TECHNIQUE: Multidetector CT imaging of the chest was performed using the standard protocol during bolus administration of intravenous contrast. Multiplanar CT image reconstructions and MIPs were obtained to evaluate the vascular anatomy. RADIATION DOSE REDUCTION: This exam was performed according to the departmental dose-optimization program which includes automated exposure control, adjustment of the mA and/or kV according to patient size and/or use of iterative reconstruction technique. CONTRAST:  OMNIPAQUE IOHEXOL 350 MG/ML SOLN COMPARISON:  01/23/2015.  Chest x-ray today. FINDINGS: Cardiovascular: No filling defects in the pulmonary arteries to suggest pulmonary emboli. Heart is normal size. Aorta is normal caliber. Mediastinum/Nodes: No mediastinal, hilar, or axillary adenopathy. Trachea and esophagus are unremarkable. Thyroid unremarkable. Lungs/Pleura: Moderate centrilobular and paraseptal emphysema. No confluent opacities or effusions. Upper Abdomen: No acute findings Musculoskeletal: Chest wall soft  tissues are unremarkable. No acute bony abnormality. Review of the MIP images confirms the above findings. IMPRESSION: No evidence of pulmonary embolus. No acute  cardiopulmonary disease. Emphysema (ICD10-J43.9). Electronically Signed   By: Charlett Nose M.D.   On: 05/17/2023 18:13   DG Chest 2 View Result Date: 05/17/2023 CLINICAL DATA:  Shortness of breath EXAM: CHEST - 2 VIEW COMPARISON:  05/10/2023 FINDINGS: Emphysematous/chronic changes within the lungs. No acute confluent opacities or effusions. Heart and mediastinal contours within normal limits. No acute bony abnormality. IMPRESSION: COPD/chronic changes. No active disease. Electronically Signed   By: Charlett Nose M.D.   On: 05/17/2023 17:20   DG Chest 2 View Result Date: 05/10/2023 CLINICAL DATA:  Chest pain, cough and shortness of breath.  Smoker. EXAM: CHEST - 2 VIEW COMPARISON:  10/13/2022.  Abdomen and pelvis CT dated 01/16/2023. FINDINGS: Normal sized heart. Small amount of persistent pleural and parenchymal scarring and bullous change at the left lung base. The lungs remain mildly hyperexpanded with mild diffuse peribronchial thickening. Old, healed right rib fracture. Right shoulder prosthesis. IMPRESSION: 1. Stable mild changes of COPD and chronic bronchitis. 2. No acute abnormality. Electronically Signed   By: Beckie Salts M.D.   On: 05/10/2023 22:06    Microbiology: Results for orders placed or performed during the hospital encounter of 05/17/23  Resp panel by RT-PCR (RSV, Flu A&B, Covid) Urine, Clean Catch     Status: None   Collection Time: 05/17/23  5:07 PM   Specimen: Urine, Clean Catch; Nasal Swab  Result Value Ref Range Status   SARS Coronavirus 2 by RT PCR NEGATIVE NEGATIVE Final    Comment: (NOTE) SARS-CoV-2 target nucleic acids are NOT DETECTED.  The SARS-CoV-2 RNA is generally detectable in upper respiratory specimens during the acute phase of infection. The lowest concentration of SARS-CoV-2 viral copies this assay  can detect is 138 copies/mL. A negative result does not preclude SARS-Cov-2 infection and should not be used as the sole basis for treatment or other patient management decisions. A negative result may occur with  improper specimen collection/handling, submission of specimen other than nasopharyngeal swab, presence of viral mutation(s) within the areas targeted by this assay, and inadequate number of viral copies(<138 copies/mL). A negative result must be combined with clinical observations, patient history, and epidemiological information. The expected result is Negative.  Fact Sheet for Patients:  BloggerCourse.com  Fact Sheet for Healthcare Providers:  SeriousBroker.it  This test is no t yet approved or cleared by the [place] FDA and  has been authorized for detection and/or diagnosis of SARS-CoV-2 by FDA under an Emergency Use Authorization (EUA). This EUA will remain  in effect (meaning this test can be used) for the duration of the COVID-19 declaration under Section 564(b)(1) of the Act, 21 U.S.C.section 360bbb-3(b)(1), unless the authorization is terminated  or revoked sooner.       Influenza A by PCR NEGATIVE NEGATIVE Final   Influenza B by PCR NEGATIVE NEGATIVE Final    Comment: (NOTE) The Xpert Xpress SARS-CoV-2/FLU/RSV plus assay is intended as an aid in the diagnosis of influenza from Nasopharyngeal swab specimens and should not be used as a sole basis for treatment. Nasal washings and aspirates are unacceptable for Xpert Xpress SARS-CoV-2/FLU/RSV testing.  Fact Sheet for Patients: BloggerCourse.com  Fact Sheet for Healthcare Providers: SeriousBroker.it  This test is not yet approved or cleared by the [place] FDA and has been authorized for detection and/or diagnosis of SARS-CoV-2 by FDA under an Emergency Use Authorization (EUA). This EUA will  remain in effect (meaning this test can be used) for the duration of the COVID-19 declaration under Section 564(b)(1) of  the Act, 21 U.S.C. section 360bbb-3(b)(1), unless the authorization is terminated or revoked.     Resp Syncytial Virus by PCR NEGATIVE NEGATIVE Final    Comment: (NOTE) Fact Sheet for Patients: BloggerCourse.com  Fact Sheet for Healthcare Providers: SeriousBroker.it  This test is not yet approved or cleared by the [place] FDA and has been authorized for detection and/or diagnosis of SARS-CoV-2 by FDA under an Emergency Use Authorization (EUA). This EUA will remain in effect (meaning this test can be used) for the duration of the COVID-19 declaration under Section 564(b)(1) of the Act, 21 U.S.C. section 360bbb-3(b)(1), unless the authorization is terminated or revoked.  Performed at [facility], [address], [place], [place] [address]   Blood Culture (routine x 2)     Status: None (Preliminary result)   Collection Time: 05/17/23  5:32 PM   Specimen: BLOOD  Result Value Ref Range Status   Specimen Description   Final    BLOOD RIGHT ANTECUBITAL Performed at [facility], [address] [place], [place] [address]    Special Requests   Final    BOTTLES DRAWN AEROBIC AND ANAEROBIC Blood Culture adequate volume Performed at [facility], [address] [place], [place] [address]    Culture   Final    NO GROWTH 2 DAYS Performed at [facility], [address], [place], [place] [address]    Report Status PENDING  Incomplete  Blood Culture (routine x 2)     Status: None (Preliminary result)   Collection Time: 05/18/23  1:04 AM   Specimen: BLOOD  Result Value Ref Range Status   Specimen Description   Final    BLOOD BLOOD LEFT HAND Performed at [facility], [address] [place], [place] [address]    Special  Requests   Final    BOTTLES DRAWN AEROBIC AND ANAEROBIC Blood Culture results may not be optimal due to an inadequate volume of blood received in culture bottles Performed at [facility], [address], [place], [place] [address]    Culture   Final    NO GROWTH 1 DAY Performed at [facility], [address] [place], [place] [address]    Report Status PENDING  Incomplete  MRSA Next Gen by PCR, Nasal     Status: None   Collection Time: 05/18/23  1:50 AM   Specimen: Nasal Mucosa; Nasal Swab  Result Value Ref Range Status   MRSA by PCR Next Gen NOT DETECTED NOT DETECTED Final    Comment: (NOTE) The GeneXpert MRSA Assay (FDA approved for NASAL specimens only), is one component of a comprehensive MRSA colonization surveillance program. It is not intended to diagnose MRSA infection nor to guide or monitor treatment for MRSA infections. Test performance is not FDA approved in patients less than 40 years old. Performed at [facility], [address], [place], [place] [address]     Labs: CBC: Recent Labs  Lab 05/17/23 1602 05/18/23 0104 05/19/23 0601  WBC 13.3* 11.5* 18.0*  NEUTROABS  --   --  13.2*  HGB 13.6 13.2 12.2  HCT 39.3 39.8 37.5  MCV 88.7 93.2 96.4  PLT 383 362 298   Basic Metabolic Panel: Recent Labs  Lab 05/17/23 1602 05/18/23 0104 05/19/23 0601  NA 137 132* 137  K 3.7 4.5 4.3  CL 103 102 109  CO2 23 20* 19*  GLUCOSE 253* 390* 192*  BUN 17 17 18  CREATININE 0.87 0.95 0.70  CALCIUM 9.1 8.9 8.6*  MG  --  2.2  --   PHOS  --  3.0  --    Liver Function Tests: Recent Labs  Lab 05/17/23 1602 05/18/23 0104  AST 14* 16  ALT 14 18  ALKPHOS 96 83  BILITOT 0.7 0.8  PROT 6.5 6.9  ALBUMIN 4.1 3.7   CBG: Recent Labs  Lab 05/18/23 2031 05/18/23 2323 05/19/23 0413 05/19/23 0736 05/19/23 1119  GLUCAP 250* 187* 214* 157* 131*    Discharge time spent: 38 minutes.   Signed: Kathlen Mody, MD [facility]

## 2023-05-22 LAB — CULTURE, BLOOD (ROUTINE X 2)
Culture: NO GROWTH
Special Requests: ADEQUATE

## 2023-05-23 LAB — CULTURE, BLOOD (ROUTINE X 2): Culture: NO GROWTH

## 2023-05-24 NOTE — Telephone Encounter (Signed)
Pharmacy Patient Advocate Encounter  Received notification from [facility] that Prior Authorization for Dexcom G7 Sensor  has been DENIED.  Full denial letter will be uploaded to the media tab. See denial reason below. Criteria for medical necessity were not met. Your records with [place] and the information submitted by your doctor do not show you meet the following criteria: (1) require long-term continuous glucose (blood sugar) monitoring as part of your diabetes (high blood sugar) management, as evidenced by both of the following: a) consistently check blood sugar at least three times per day and b) adherent to a diabetic treatment plan and can be trained to use a continuous glucose monitor (CGM) (2) have one of the following: a) type 1 diabetes (high blood sugar levels due to the body not making insulin), b) type 2 diabetes mellitus (high blood sugar levels) treated with basal insulin, with a hemoglobin A1c (a blood test that is an indicator of blood sugar levels over the past 3 months) above goal, despite appropriate changes in insulin therapy and compliance with the treatment plan, c) use at least three insulin injections per day or an insulin pump, d) problematic hypoglycemia (low blood sugar), or e) pregnant  PA #/Case ID/Reference #: [id number]

## 2023-06-06 ENCOUNTER — Other Ambulatory Visit: Payer: Self-pay

## 2023-06-06 ENCOUNTER — Emergency Department
Admission: EM | Admit: 2023-06-06 | Discharge: 2023-06-06 | Disposition: A | Discharge status: Home / Self Care | Payer: 59 | Attending: Emergency Medicine | Admitting: Emergency Medicine

## 2023-06-06 DIAGNOSIS — M5441 Lumbago with sciatica, right side: Secondary | ICD-10-CM | POA: Insufficient documentation

## 2023-06-06 DIAGNOSIS — X58XXXA Exposure to other specified factors, initial encounter: Secondary | ICD-10-CM | POA: Diagnosis not present

## 2023-06-06 DIAGNOSIS — S39012A Strain of muscle, fascia and tendon of lower back, initial encounter: Secondary | ICD-10-CM | POA: Diagnosis not present

## 2023-06-06 DIAGNOSIS — Z5900 Homelessness unspecified: Secondary | ICD-10-CM | POA: Insufficient documentation

## 2023-06-06 DIAGNOSIS — Z8739 Personal history of other diseases of the musculoskeletal system and connective tissue: Secondary | ICD-10-CM | POA: Diagnosis not present

## 2023-06-06 DIAGNOSIS — R300 Dysuria: Secondary | ICD-10-CM | POA: Diagnosis not present

## 2023-06-06 DIAGNOSIS — S3992XA Unspecified injury of lower back, initial encounter: Secondary | ICD-10-CM | POA: Diagnosis present

## 2023-06-06 DIAGNOSIS — G8929 Other chronic pain: Secondary | ICD-10-CM | POA: Diagnosis not present

## 2023-06-06 LAB — URINALYSIS, W/ REFLEX TO CULTURE (INFECTION SUSPECTED)
Bacteria, UA: NONE SEEN
Bilirubin Urine: NEGATIVE
Glucose, UA: 50 mg/dL — AB
Hgb urine dipstick: NEGATIVE
Ketones, ur: NEGATIVE mg/dL
Nitrite: NEGATIVE
Protein, ur: 30 mg/dL — AB
Specific Gravity, Urine: 1.03 (ref 1.005–1.030)
pH: 5 (ref 5.0–8.0)

## 2023-06-06 MED ORDER — CEPHALEXIN 500 MG PO CAPS
500.0000 mg | ORAL_CAPSULE | Freq: Once | ORAL | Status: AC
  Administered 2023-06-06: 500 mg via ORAL
  Filled 2023-06-06: qty 1

## 2023-06-06 MED ORDER — KETOROLAC TROMETHAMINE 30 MG/ML IJ SOLN
30.0000 mg | Freq: Once | INTRAMUSCULAR | Status: AC
  Administered 2023-06-06: 30 mg via INTRAMUSCULAR
  Filled 2023-06-06: qty 1

## 2023-06-06 MED ORDER — LIDOCAINE 5 % EX PTCH
1.0000 | MEDICATED_PATCH | CUTANEOUS | Status: DC
  Administered 2023-06-06: 1 via TRANSDERMAL
  Filled 2023-06-06: qty 1

## 2023-06-06 MED ORDER — MUSCLE RUB 10-15 % EX CREA: 1.0000 | TOPICAL_CREAM | CUTANEOUS | 0 refills | Status: AC | PRN

## 2023-06-06 MED ORDER — MUSCLE RUB 10-15 % EX CREA: 1.0000 | TOPICAL_CREAM | CUTANEOUS | 0 refills | Status: DC | PRN

## 2023-06-06 MED ORDER — ACETAMINOPHEN 500 MG PO TABS
1000.0000 mg | ORAL_TABLET | Freq: Once | ORAL | Status: AC
  Administered 2023-06-06: 1000 mg via ORAL
  Filled 2023-06-06: qty 2

## 2023-06-06 MED ORDER — CEPHALEXIN 500 MG PO CAPS: 500.0000 mg | ORAL_CAPSULE | Freq: Four times a day (QID) | ORAL | 0 refills | Status: AC

## 2023-06-06 NOTE — Discharge Instructions (Addendum)
 Call spinal specialist Dr. Katrina or Dr. Deatrice for an appointment.  Take Tylenol  650 mg every 6 hours as needed for pain.  Try the new topical muscle pain cream that I prescribed for you.  For the burning sensation when you urinate may be reflective of a early urinary tract infection for which you should take antibiotics as prescribed.  Thank you for choosing us  for your health care today!  Please see your primary doctor this week for a follow up appointment.   If you have any new, worsening, or unexpected symptoms call your doctor right away or come back to the emergency department for reevaluation.  It was my pleasure to care for you today.   Ginnie EDISON Cyrena, MD

## 2023-06-06 NOTE — ED Provider Notes (Signed)
 [facility] Provider Note    Event Date/Time   First MD Initiated Contact with Patient 06/06/23 0255     (approximate)   History   Back Pain   HPI  Shawna [name] is a 58 y.o. female   Past medical history of chronic lower back pain who presents the emergency department with lower back pain exacerbated a couple of days ago by rearranging her car.  Unfortunately she is homeless and lives in her car.  She had no direct trauma or fall.  This is consistent with her longstanding history of chronic back pain with sciatica which radiates down her right leg.  It feels identical in quality location radiation to exacerbations of her lower back pain in the past.  She does have mild dysuria as well.  Otherwise she has no history of kidney stone.  She has no fevers or chills.  She denies any motor or sensory changes and no incontinence.  She has no other acute medical complaints   External Medical Documents Reviewed: July 2024 lumbar x-ray showing degenerative disc disease multilevel      Physical Exam   Triage Vital Signs: ED Triage Vitals [06/06/23 0254]  Encounter Vitals Group     BP      Systolic BP Percentile      Diastolic BP Percentile      Pulse      Resp      Temp      Temp src      SpO2      Weight 169 lb 12.1 oz (77 kg)     Height 5' 6 (1.676 m)     Head Circumference      Peak Flow      Pain Score 9     Pain Loc      Pain Education      Exclude from Growth Chart     Most recent vital signs: Vitals:   06/06/23 0256 06/06/23 0300  BP: 126/70 101/70  Pulse: 100 (!) 104  Resp: 16   Temp: 98 F (36.7 C)   SpO2: 95% 96%    General: Awake, no distress.  CV:  Good peripheral perfusion.  Resp:  Normal effort.  Abd:  No distention.  Other:  Bilateral paraspinal lumbar tenderness to palpation without any midline tenderness step-off or deformity.  Motor sensor intact bilateral lower extremities and is steady gait.  Hemodynamics  appropriate reassuring, afebrile and nontoxic   ED Results / Procedures / Treatments   Labs (all labs ordered are listed, but only abnormal results are displayed) Labs Reviewed  URINALYSIS, W/ REFLEX TO CULTURE (INFECTION SUSPECTED)      PROCEDURES:  Critical Care performed: No  Procedures   MEDICATIONS ORDERED IN ED: Medications  lidocaine  (LIDODERM ) 5 % 1 patch (1 patch Transdermal Patch Applied 06/06/23 0304)  acetaminophen  (TYLENOL ) tablet 1,000 mg (1,000 mg Oral Given 06/06/23 0304)  ketorolac  (TORADOL ) 30 MG/ML injection 30 mg (30 mg Intramuscular Given 06/06/23 0304)    IMPRESSION / MDM / ASSESSMENT AND PLAN / ED COURSE  I reviewed the triage vital signs and the nursing notes.                                Patient's presentation is most consistent with acute presentation with potential threat to life or bodily function.  Differential diagnosis includes, but is not limited to, lumbar strain, degenerative disc disease,  bulging disc, sciatica, considered but less likely fracture dislocation or cord compromise, pyelonephritis or kidney stone   The patient is on the cardiac monitor to evaluate for evidence of arrhythmia and/or significant heart rate changes.  MDM:    The back pain is most likely representative of her longstanding chronic back pain degenerative disc disease with sciatica exacerbated by sleeping in her car and rearranging her car.  There is no direct trauma or midline tenderness to suggest fracture or dislocation so defer imaging at this time.  She incidentally has some dysuria for which we will check a urinalysis for urinary tract infection but given her back pain is more consistent with musculoskeletal I think this less likely represents pyelonephritis.  She also does not appear septic and has normal vital signs and so we will treat urinary symptoms as lower urinary tract infection if positive on urinalysis.  Otherwise giving pain control for her lower back  pain and will give contact information for spinal specialist that she has not seen one in the past.  Given her overall well appearance, chronicity of symptoms, I think that outpatient management is appropriate- dc      FINAL CLINICAL IMPRESSION(S) / ED DIAGNOSES   Final diagnoses:  Strain of lumbar region, initial encounter  Chronic bilateral low back pain with right-sided sciatica  History of degenerative disc disease  Dysuria     Rx / DC Orders   ED Discharge Orders     None        Note:  This document was prepared using Dragon voice recognition software and may include unintentional dictation errors.    Cyrena Mylar, MD 06/06/23 [phone]

## 2023-06-06 NOTE — ED Triage Notes (Addendum)
 Pt to ed from home via POV for lower back pain x 5 days. Pt is caox4, in no acute distress and ambulatory. Pt states "I havent left the parking lot bc I live out of my car"

## 2023-06-22 ENCOUNTER — Ambulatory Visit: Payer: Self-pay | Admitting: Pediatrics

## 2023-06-23 ENCOUNTER — Other Ambulatory Visit (HOSPITAL_COMMUNITY): Payer: Self-pay

## 2023-07-15 ENCOUNTER — Other Ambulatory Visit (HOSPITAL_COMMUNITY): Payer: Self-pay

## 2023-07-18 ENCOUNTER — Emergency Department
Admission: EM | Admit: 2023-07-18 | Discharge: 2023-07-18 | Disposition: A | Discharge status: Home / Self Care | Attending: Emergency Medicine | Admitting: Emergency Medicine

## 2023-07-18 ENCOUNTER — Emergency Department

## 2023-07-18 DIAGNOSIS — J449 Chronic obstructive pulmonary disease, unspecified: Secondary | ICD-10-CM | POA: Insufficient documentation

## 2023-07-18 DIAGNOSIS — R6889 Other general symptoms and signs: Secondary | ICD-10-CM

## 2023-07-18 DIAGNOSIS — R6883 Chills (without fever): Secondary | ICD-10-CM | POA: Diagnosis present

## 2023-07-18 LAB — RESP PANEL BY RT-PCR (RSV, FLU A&B, COVID)  RVPGX2
Influenza A by PCR: NEGATIVE
Influenza B by PCR: NEGATIVE
Resp Syncytial Virus by PCR: NEGATIVE
SARS Coronavirus 2 by RT PCR: NEGATIVE

## 2023-07-18 MED ORDER — IBUPROFEN 600 MG PO TABS: 600.0000 mg | ORAL_TABLET | Freq: Three times a day (TID) | ORAL | 0 refills | Status: AC | PRN

## 2023-07-18 MED ORDER — IBUPROFEN 800 MG PO TABS
800.0000 mg | ORAL_TABLET | Freq: Once | ORAL | Status: AC
  Administered 2023-07-18: 800 mg via ORAL
  Filled 2023-07-18: qty 1

## 2023-07-18 MED ORDER — ONDANSETRON 8 MG PO TBDP
8.0000 mg | ORAL_TABLET | Freq: Once | ORAL | Status: AC
  Administered 2023-07-18: 8 mg via ORAL
  Filled 2023-07-18: qty 1

## 2023-07-18 MED ORDER — ONDANSETRON 4 MG PO TBDP: 4.0000 mg | ORAL_TABLET | Freq: Three times a day (TID) | ORAL | 0 refills | Status: DC | PRN

## 2023-07-18 NOTE — ED Triage Notes (Signed)
 Pt presents to the ED pov from home. Pt states that she started having chills today and just doesn't feel well. Denies any other sx's.

## 2023-07-18 NOTE — ED Provider Notes (Signed)
 [facility] Provider Note    Event Date/Time   First MD Initiated Contact with Patient 07/18/23 2219     (approximate)   History   Chills   HPI  Shawna [name] is a 58 y.o. female who presents today with history of chills, nauseas.  Patient states that she lives in her car.  Patient has history of COPD and IBS.  Patient has productive cough, anorexia, denies fever, diarrhea.      Physical Exam   Triage Vital Signs: ED Triage Vitals  Encounter Vitals Group     BP 07/18/23 2137 118/85     Systolic BP Percentile --      Diastolic BP Percentile --      Pulse Rate 07/18/23 2137 100     Resp 07/18/23 2137 18     Temp 07/18/23 2137 98.2 F (36.8 C)     Temp Source 07/18/23 2137 Oral     SpO2 07/18/23 2137 94 %     Weight 07/18/23 2138 169 lb 12.1 oz (77 kg)     Height 07/18/23 2138 5\' 6"  (1.676 m)     Head Circumference --      Peak Flow --      Pain Score 07/18/23 2138 0     Pain Loc --      Pain Education --      Exclude from Growth Chart --     Most recent vital signs: Vitals:   07/18/23 2137  BP: 118/85  Pulse: 100  Resp: 18  Temp: 98.2 F (36.8 C)  SpO2: 94%     Constitutional: Alert, NAD. Able to speak in complete sentences without cough or dyspnea  Eyes: Conjunctivae are normal.  Head: Atraumatic. Nose: No congestion/rhinnorhea. Mouth/Throat: Mucous membranes are moist.  Mild peritonsillar erythema. Neck: Painless ROM. Supple. No JVD, nodes, thyromegaly  Cardiovascular:   Good peripheral circulation.RRR no murmurs, gallops, rubs  Respiratory: Normal respiratory effort.  No retractions. Clear to auscultation bilaterally without wheezing or crackles  Gastrointestinal: Soft and nontender.  Musculoskeletal:  no deformity Neurologic:  MAE spontaneously. No gross focal neurologic deficits are appreciated.  Skin:  Skin is warm, dry and intact. No rash noted. Psychiatric: Mood and affect are normal. Speech and behavior are normal.     ED Results / Procedures / Treatments   Labs (all labs ordered are listed, but only abnormal results are displayed) Labs Reviewed  RESP PANEL BY RT-PCR (RSV, FLU A&B, COVID)  [id number]     EKG     RADIOLOGY I independently reviewed and interpreted imaging and agree with radiologists findings.      PROCEDURES:  Critical Care performed:   Procedures   MEDICATIONS ORDERED IN ED: Medications  ondansetron (ZOFRAN-ODT) disintegrating tablet 8 mg (8 mg Oral Given 07/18/23 2304)  ibuprofen (ADVIL) tablet 800 mg (800 mg Oral Given 07/18/23 2305)   Clinical Course as of 07/18/23 2309  Sun Jul 18, 2023  2245 Resp panel by RT-PCR (RSV, Flu A&B, Covid) Anterior Nasal Swab Negative [AE]  2257 DG Chest 1 View No acute abnormality noted [AE]    Clinical Course User Index [AE] Gladys Damme, PA-C    IMPRESSION / MDM / ASSESSMENT AND PLAN / ED COURSE  I reviewed the triage vital signs and the nursing notes.  Differential diagnosis includes, but is not limited to, influenza, COVID, RSV, pneumonia  Patient's presentation is most consistent with acute complicated illness / injury requiring diagnostic workup.   Patient's diagnosis  is consistent with chills and nauseas.. I independently reviewed and interpreted imaging and agree with radiologists findings ruling out pneumonia I did review the patient's allergies and medications.during admission patient received ibuprofen, Zofran. Phe patient is in stable and satisfactory condition for discharge home  Patient will be discharged home with prescriptions for Zofran, ibuprofen. Patient is to follow up with PCP as needed or otherwise directed. Patient is given ED precautions to return to the ED for any worsening or new symptoms. Discussed plan of care with patient, answered all of patient's questions, Patient agreeable to plan of care. Advised patient to take medications according to the instructions on the label. Discussed possible side  effects of new medications. Patient verbalized understanding.    FINAL CLINICAL IMPRESSION(S) / ED DIAGNOSES   Final diagnoses:  Chills  Flu-like symptoms     Rx / DC Orders   ED Discharge Orders          Ordered    ibuprofen (ADVIL) 600 MG tablet  Every 8 hours PRN        07/18/23 2307    ondansetron (ZOFRAN-ODT) 4 MG disintegrating tablet  Every 8 hours PRN        07/18/23 2307             Note:  This document was prepared using Dragon voice recognition software and may include unintentional dictation errors.   Gladys Damme, PA-C 07/18/23 2309    Minna Antis, MD 07/19/23 1800

## 2023-07-18 NOTE — Discharge Instructions (Addendum)
 You have been diagnosed with chills and nauseas.  Please take Zofran 20 minutes before main meals every 8 hours.  Please take ibuprofen 1 tablet by mouth after main meals for chills.  Drink plenty of fluids.  Please come back to ED or go to your PCP if you have new symptoms or symptoms worsen

## 2023-07-29 ENCOUNTER — Emergency Department

## 2023-07-29 ENCOUNTER — Other Ambulatory Visit: Payer: Self-pay

## 2023-07-29 ENCOUNTER — Emergency Department
Admission: EM | Admit: 2023-07-29 | Discharge: 2023-07-29 | Disposition: A | Discharge status: Home / Self Care | Attending: Emergency Medicine | Admitting: Emergency Medicine

## 2023-07-29 DIAGNOSIS — R52 Pain, unspecified: Secondary | ICD-10-CM | POA: Insufficient documentation

## 2023-07-29 DIAGNOSIS — R0602 Shortness of breath: Secondary | ICD-10-CM | POA: Insufficient documentation

## 2023-07-29 LAB — RESP PANEL BY RT-PCR (RSV, FLU A&B, COVID)  RVPGX2
Influenza A by PCR: NEGATIVE
Influenza B by PCR: NEGATIVE
Resp Syncytial Virus by PCR: NEGATIVE
SARS Coronavirus 2 by RT PCR: NEGATIVE

## 2023-07-29 MED ORDER — KETOROLAC TROMETHAMINE 60 MG/2ML IM SOLN
30.0000 mg | Freq: Once | INTRAMUSCULAR | Status: AC
  Administered 2023-07-29: 30 mg via INTRAMUSCULAR
  Filled 2023-07-29: qty 2

## 2023-07-29 MED ORDER — ONDANSETRON 4 MG PO TBDP
4.0000 mg | ORAL_TABLET | Freq: Once | ORAL | Status: AC
  Administered 2023-07-29: 4 mg via ORAL
  Filled 2023-07-29: qty 1

## 2023-07-29 NOTE — Discharge Instructions (Signed)
 Please call your primary care team to set up an outpatient appointment for management of your chronic pain symptoms.

## 2023-07-29 NOTE — ED Provider Notes (Signed)
 [facility] Provider Note    Event Date/Time   First MD Initiated Contact with Patient 07/29/23 [phone]     (approximate)   History   Generalized Body Aches   HPI Shawna [name] is a 58 y.o. female presenting today for body aches.  Patient states she awoke overnight to new body aches.  Has chronic shortness of breath with her COPD and unsure if it is related to that.  Having bodyaches all over.  No particular new fevers, cough, congestion, chest pain, abdominal pain, nausea, vomiting, dysuria, diarrhea, constipation.     Physical Exam   Triage Vital Signs: ED Triage Vitals  Encounter Vitals Group     BP 07/29/23 0414 119/76     Systolic BP Percentile --      Diastolic BP Percentile --      Pulse Rate 07/29/23 0414 80     Resp 07/29/23 0414 18     Temp 07/29/23 0414 98.3 F (36.8 C)     Temp Source 07/29/23 0414 Oral     SpO2 07/29/23 0410 98 %     Weight 07/29/23 0415 169 lb 12.1 oz (77 kg)     Height 07/29/23 0415 6' (1.829 m)     Head Circumference --      Peak Flow --      Pain Score 07/29/23 0414 9     Pain Loc --      Pain Education --      Exclude from Growth Chart --     Most recent vital signs: Vitals:   07/29/23 0410 07/29/23 0414  BP:  119/76  Pulse:  80  Resp:  18  Temp:  98.3 F (36.8 C)  SpO2: 98% 98%   Physical Exam: I have reviewed the vital signs and nursing notes. General: Awake, alert, no acute distress.  Nontoxic appearing. Head:  Atraumatic, normocephalic.   ENT:  EOM intact, PERRL. Oral mucosa is pink and moist with no lesions. Neck: Neck is supple with full range of motion, No meningeal signs. Cardiovascular:  RRR, No murmurs. Peripheral pulses palpable and equal bilaterally. Respiratory:  Symmetrical chest wall expansion.  No rhonchi, rales, or wheezes.  Good air movement throughout.  No use of accessory muscles.   Musculoskeletal:  No cyanosis or edema. Moving extremities with full ROM Abdomen:  Soft, nontender,  nondistended. Neuro:  GCS 15, moving all four extremities, interacting appropriately. Speech clear. Psych:  Calm, appropriate.   Skin:  Warm, dry, no rash.    ED Results / Procedures / Treatments   Labs (all labs ordered are listed, but only abnormal results are displayed) Labs Reviewed  RESP PANEL BY RT-PCR (RSV, FLU A&B, COVID)  [id number]     EKG    RADIOLOGY Independently interpreted chest x-ray with no acute pathology   PROCEDURES:  Critical Care performed: No  Procedures   MEDICATIONS ORDERED IN ED: Medications  ketorolac (TORADOL) injection 30 mg (30 mg Intramuscular Given 07/29/23 0441)  ondansetron (ZOFRAN-ODT) disintegrating tablet 4 mg (4 mg Oral Given 07/29/23 0440)     IMPRESSION / MDM / ASSESSMENT AND PLAN / ED COURSE  I reviewed the triage vital signs and the nursing notes.                              Differential diagnosis includes, but is not limited to, chronic pain syndrome, opioid use dependence, viral infection, pneumonia  Patient's presentation  is most consistent with acute complicated illness / injury requiring diagnostic workup.  Patient is a 58 year old female presenting today for generalized body aches.  Mild shortness of breath but unsure if this is her baseline or new for her.  Physical exam otherwise completely unremarkable and vital signs stable.  No obvious abnormal lung sounds on auscultation.  Patient given Toradol for pain and Zofran as needed for nausea.  Respiratory panel negative.  Chest x-ray negative.  Suspect patient's symptoms are more likely exacerbation of her chronic pain.  She is otherwise stable and safe for discharge.  Told to follow-up with her PCP for long-term management.  Clinical Course as of 07/29/23 0518  Thu Jul 29, 2023  0452 DG Chest Portable 1 View Independently interpreted with no acute findings [DW]    Clinical Course User Index [DW] Janith Lima, MD     FINAL CLINICAL IMPRESSION(S) / ED DIAGNOSES    Final diagnoses:  Generalized body aches     Rx / DC Orders   ED Discharge Orders     None        Note:  This document was prepared using Dragon voice recognition software and may include unintentional dictation errors.   Janith Lima, MD 07/29/23 [phone]

## 2023-07-29 NOTE — ED Triage Notes (Signed)
 S: - Pt arrives via EMS from home  B: - Medical Hx: Recently new Diabetic  - Pertinent Medications: New diabetic medications from [facility] A: - EMS Assessment: No abnormal findings - EMS Vitals: 130/70, 98% RA, HR: 70 - EMS interventions: none  Is Pt on O2? Is this their baseline? - This RNs Assessment: Airway - Holds on own  Breathing -  98% RA Circulation - <3 seconds  Disability - Alert and oriented x4 R: - What brought Pt into ER today? States woke up at 0130 and felt body aches all over, feels like she's coming down with something. States feels fatigued. Appetite has been normal.

## 2023-07-29 NOTE — ED Notes (Signed)
 Urine sent down to lab with Pt label.

## 2023-07-29 NOTE — ED Notes (Signed)
 Multiple attempts to call a taxi/cab to get Pt home as she states she doesn't have means of transportation. No open service at this time. Pt told this RN she would try [facility] so this RN attempted to help Pt book an uber but Pt payment method declining. This RN will attempt Taxi/cab service when they open.

## 2023-08-05 ENCOUNTER — Other Ambulatory Visit (HOSPITAL_COMMUNITY): Payer: Self-pay

## 2023-08-14 ENCOUNTER — Other Ambulatory Visit (HOSPITAL_COMMUNITY): Payer: Self-pay

## 2023-08-16 ENCOUNTER — Other Ambulatory Visit (HOSPITAL_COMMUNITY): Payer: Self-pay

## 2023-08-20 ENCOUNTER — Ambulatory Visit: Payer: MEDICAID | Admitting: Internal Medicine

## 2023-08-22 ENCOUNTER — Encounter: Payer: Self-pay | Admitting: Emergency Medicine

## 2023-08-22 ENCOUNTER — Other Ambulatory Visit: Payer: Self-pay

## 2023-08-22 ENCOUNTER — Emergency Department
Admission: EM | Admit: 2023-08-22 | Discharge: 2023-08-22 | Disposition: A | Discharge status: Home / Self Care | Payer: MEDICAID | Attending: Emergency Medicine | Admitting: Emergency Medicine

## 2023-08-22 DIAGNOSIS — E119 Type 2 diabetes mellitus without complications: Secondary | ICD-10-CM | POA: Insufficient documentation

## 2023-08-22 DIAGNOSIS — J449 Chronic obstructive pulmonary disease, unspecified: Secondary | ICD-10-CM | POA: Diagnosis not present

## 2023-08-22 DIAGNOSIS — M79605 Pain in left leg: Secondary | ICD-10-CM | POA: Diagnosis present

## 2023-08-22 NOTE — ED Triage Notes (Signed)
 Pt presents to the ED via [facility] with complaints of L leg pain x 4 days. She notes that the pain starts at her thigh and radiates towards her L foot. Palpable pedal pulse - cap refill <3 seconds bilaterally. A&Ox4 at this time. Denies CP or SOB.

## 2023-08-22 NOTE — ED Provider Notes (Signed)
   [facility] Provider Note    Event Date/Time   First MD Initiated Contact with Patient 08/22/23 [phone]     (approximate)   History   Leg Pain   HPI  Shawna [name] is a 58 y.o. female with a history of anxiety, GERD, substance abuse, diabetes, COPD who comes ED planing of left leg pain for the past 4 days.  Intermittent, only present when she is getting ready to go to sleep at night.  No chest pain or shortness of breath, no swelling fever or trauma.  No history of DVT or PE.     Physical Exam   Triage Vital Signs: ED Triage Vitals  Encounter Vitals Group     BP 08/22/23 0015 108/73     Systolic BP Percentile --      Diastolic BP Percentile --      Pulse Rate 08/22/23 0015 84     Resp 08/22/23 0015 18     Temp 08/22/23 0015 98 F (36.7 C)     Temp Source 08/22/23 0015 Oral     SpO2 08/22/23 0015 98 %     Weight 08/22/23 0017 160 lb (72.6 kg)     Height 08/22/23 0017 5\' 6"  (1.676 m)     Head Circumference --      Peak Flow --      Pain Score 08/22/23 0015 8     Pain Loc --      Pain Education --      Exclude from Growth Chart --     Most recent vital signs: Vitals:   08/22/23 0015  BP: 108/73  Pulse: 84  Resp: 18  Temp: 98 F (36.7 C)  SpO2: 98%    General: Awake, no distress.  CV:  Good peripheral perfusion.  Normal distal pulses Resp:  Normal effort.  Abd:  No distention.  Other:  No calf tenderness, symmetric calf circumference.  No inflammatory skin changes or erythema.  No palpable cord, negative Homans' sign.   ED Results / Procedures / Treatments   Labs (all labs ordered are listed, but only abnormal results are displayed) Labs Reviewed - No data to display   RADIOLOGY    PROCEDURES:  Procedures   MEDICATIONS ORDERED IN ED: Medications - No data to display   IMPRESSION / MDM / ASSESSMENT AND PLAN / ED COURSE  I reviewed the triage vital signs and the nursing notes.                             Patient  presents with left leg pain which appears musculoskeletal.  No evidence of significant trauma or infection, doubt DVT       FINAL CLINICAL IMPRESSION(S) / ED DIAGNOSES   Final diagnoses:  Left leg pain     Rx / DC Orders   ED Discharge Orders     None        Note:  This document was prepared using Dragon voice recognition software and may include unintentional dictation errors.   Jacquie Maudlin, MD 08/22/23 [phone]

## 2023-08-26 ENCOUNTER — Emergency Department: Payer: MEDICAID

## 2023-08-26 ENCOUNTER — Other Ambulatory Visit: Payer: Self-pay

## 2023-08-26 ENCOUNTER — Emergency Department
Admission: EM | Admit: 2023-08-26 | Discharge: 2023-08-26 | Disposition: A | Discharge status: Home / Self Care | Payer: MEDICAID | Attending: Emergency Medicine | Admitting: Emergency Medicine

## 2023-08-26 DIAGNOSIS — I10 Essential (primary) hypertension: Secondary | ICD-10-CM | POA: Diagnosis not present

## 2023-08-26 DIAGNOSIS — M79605 Pain in left leg: Secondary | ICD-10-CM | POA: Insufficient documentation

## 2023-08-26 DIAGNOSIS — E119 Type 2 diabetes mellitus without complications: Secondary | ICD-10-CM | POA: Insufficient documentation

## 2023-08-26 DIAGNOSIS — Z7984 Long term (current) use of oral hypoglycemic drugs: Secondary | ICD-10-CM | POA: Insufficient documentation

## 2023-08-26 DIAGNOSIS — Z5900 Homelessness unspecified: Secondary | ICD-10-CM | POA: Diagnosis not present

## 2023-08-26 DIAGNOSIS — Z794 Long term (current) use of insulin: Secondary | ICD-10-CM | POA: Diagnosis not present

## 2023-08-26 DIAGNOSIS — M879 Osteonecrosis, unspecified: Secondary | ICD-10-CM

## 2023-08-26 DIAGNOSIS — J449 Chronic obstructive pulmonary disease, unspecified: Secondary | ICD-10-CM | POA: Insufficient documentation

## 2023-08-26 DIAGNOSIS — Z79899 Other long term (current) drug therapy: Secondary | ICD-10-CM | POA: Insufficient documentation

## 2023-08-26 MED ORDER — KETOROLAC TROMETHAMINE 30 MG/ML IJ SOLN
60.0000 mg | Freq: Once | INTRAMUSCULAR | Status: AC
  Administered 2023-08-26: 60 mg via INTRAMUSCULAR
  Filled 2023-08-26: qty 2

## 2023-08-26 MED ORDER — ONDANSETRON 4 MG PO TBDP: 4.0000 mg | ORAL_TABLET | Freq: Four times a day (QID) | ORAL | 0 refills | Status: DC | PRN

## 2023-08-26 MED ORDER — DIPHENHYDRAMINE HCL 25 MG PO CAPS
50.0000 mg | ORAL_CAPSULE | Freq: Once | ORAL | Status: AC
  Administered 2023-08-26: 50 mg via ORAL
  Filled 2023-08-26: qty 2

## 2023-08-26 MED ORDER — OXYCODONE-ACETAMINOPHEN 5-325 MG PO TABS
1.0000 | ORAL_TABLET | Freq: Four times a day (QID) | ORAL | 0 refills | Status: DC | PRN
Start: 2023-08-26 — End: 2023-09-15

## 2023-08-26 NOTE — Discharge Instructions (Addendum)
 You need to follow-up with both orthopedics and vascular to have further workup to why this has been happening.  You should return to the ER if you develop worsening symptoms or any other concerns  IMPRESSION: 1. 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 2. Superimposed substantial degenerative or perhaps also post ischemic changes also in the talar dome. 3. No acute osseous abnormality identified  Take oxycodone  as prescribed. Do not drink alcohol, drive or participate in any other potentially dangerous activities while taking this medication as it may make you sleepy. Do not take this medication with any other sedating medications, either prescription or over-the-counter. If you were prescribed Percocet or Vicodin, do not take these with acetaminophen  (Tylenol ) as it is already contained within these medications.  This medication is an opiate (or narcotic) pain medication and can be habit forming. Use it as little as possible to achieve adequate pain control. Do not use or use it with extreme caution if you have a history of opiate abuse or dependence. If you are on a pain contract with your primary care doctor or a pain specialist, be sure to let them know you were prescribed this medication today from the Emergency Department. This medication is intended for your use only - do not give any to anyone else and keep it in a secure place where nobody else, especially children, have access to it.

## 2023-08-26 NOTE — ED Provider Notes (Signed)
 [facility] Provider Note    Event Date/Time   First MD Initiated Contact with Patient 08/26/23 [phone]     (approximate)   History   Leg Pain   HPI  Shawna [name] is a 58 y.o. female with history of COPD, hypertension, diabetes, hyperlipidemia, right-sided sciatica who presents to the emergency department with 1 week of left leg pain.  States it is in the anterior thigh but radiates throughout the entire leg.  No known injury.  No increased redness, warmth or swelling.  No chest pain or shortness of breath.  No prior history of PE or DVT.  Taking over-the-counter Tylenol , ibuprofen  without relief.  Patient is on gabapentin , clonazepam  chronically.   History provided by patient.    Past Medical History:  Diagnosis Date   Anxiety    Asthma    Bulging lumbar disc    Bulging lumbar disc    COPD (chronic obstructive pulmonary disease) (HCC)    GERD (gastroesophageal reflux disease)    Homelessness 11/09/2022   Insomnia    Kidney stone    Pneumonia 03/2015   Sciatic leg pain    Right side   Sciatica of right side    Weakness    Bil knees, and ankles    Past Surgical History:  Procedure Laterality Date   ABDOMINAL HYSTERECTOMY     Partial   ESOPHAGOGASTRODUODENOSCOPY (EGD) WITH PROPOFOL  N/A 11/27/2015   Procedure: ESOPHAGOGASTRODUODENOSCOPY (EGD) WITH PROPOFOL ;  Surgeon: Deveron Fly, MD;  Location: [facility] ENDOSCOPY;  Service: Endoscopy;  Laterality: N/A;   SHOULDER SURGERY Right    TONSILLECTOMY     TUBAL LIGATION      MEDICATIONS:  Prior to Admission medications   Medication Sig Start Date End Date Taking? Authorizing Provider  albuterol  (VENTOLIN  HFA) 108 (90 Base) MCG/ACT inhaler Inhale 2 puffs into the lungs every 6 (six) hours as needed for wheezing or shortness of breath. 05/19/23   Feliciana Horn, MD  atorvastatin  (LIPITOR) 10 MG tablet Take 1 tablet (10 mg total) by mouth at bedtime. 05/19/23   Akula, Vijaya, MD  Blood Glucose Monitoring  Suppl (ACCU-CHEK GUIDE) w/Device KIT Use in the morning, at noon, and at bedtime. 05/19/23   Akula, Vijaya, MD  clonazePAM  (KLONOPIN ) 1 MG tablet Take 1 mg by mouth 2 (two) times daily as needed for anxiety. 10/15/22   [provider]  dexlansoprazole  (DEXILANT ) 60 MG capsule Take 1 capsule (60 mg total) by mouth daily. 05/19/23   Akula, Vijaya, MD  dicyclomine  (BENTYL ) 20 MG tablet Take 1 tablet (20 mg total) by mouth 2 (two) times daily. 05/19/23   Akula, Vijaya, MD  famotidine  (PEPCID ) 20 MG tablet Take 1 tablet (20 mg total) by mouth daily. 05/19/23   Akula, Vijaya, MD  gabapentin  (NEURONTIN ) 300 MG capsule Take 1 capsule (300 mg total) by mouth 3 (three) times daily. 05/19/23   Akula, Vijaya, MD  guaiFENesin  (MUCINEX ) 600 MG 12 hr tablet Take 1 tablet (600 mg total) by mouth 2 (two) times daily as needed. 05/19/23   Akula, Vijaya, MD  hydrOXYzine  (ATARAX ) 50 MG tablet Take 1-2 tablets (50-100 mg total) by mouth at bedtime as needed for anxiety (sleep). 05/19/23   Akula, Vijaya, MD  insulin  aspart (NOVOLOG ) 100 UNIT/ML FlexPen Use as directed per sliding scale. CBG 70 - 120: 0 units. CBG 121 - 150: 1 unit. CBG 151 - 200: 2 units. CBG 201 - 250: 3 units. CBG 251 - 300: 5 units. CBG 301 -  350: 7 units. CBG 351 - 400: 9 units 05/19/23   Akula, Vijaya, MD  Insulin  Pen Needle 31G X 6 MM MISC Use to inject insulin  as directed 3 (three) times daily before meals. 05/19/23   Akula, Vijaya, MD  ipratropium-albuterol  (DUONEB) 0.5-2.5 (3) MG/3ML SOLN Take 3 mLs by nebulization every 6 (six) hours as needed. 05/19/23 02/13/24  Akula, Vijaya, MD  lidocaine  (XYLOCAINE ) 5 % ointment Apply 1 Application topically daily as needed for mild pain (pain score 1-3) or moderate pain (pain score 4-6). 08/17/22   [provider]  MAGNESIUM  PO Take 1 tablet by mouth daily.    [provider]  Menthol-Methyl Salicylate (MUSCLE RUB) 10-15 % CREA Apply 1 Application topically as needed. 06/06/23   Buell Carmin, MD   metFORMIN  (GLUCOPHAGE -XR) 500 MG 24 hr tablet Take 1 tablet (500 mg total) by mouth 2 (two) times daily with a meal. 05/19/23   Feliciana Horn, MD  nicotine  (NICODERM CQ  - DOSED IN MG/24 HOURS) 14 mg/24hr patch Place 1 patch (14 mg total) onto the skin daily. 05/20/23   Akula, Vijaya, MD  ondansetron  (ZOFRAN -ODT) 4 MG disintegrating tablet Take 1 tablet (4 mg total) by mouth every 8 (eight) hours as needed for nausea or vomiting. 07/18/23   Awilda Lennox, PA-C  POTASSIUM PO Take 1 tablet by mouth daily.    [provider]  rOPINIRole  (REQUIP ) 2 MG tablet Take 1 tablet (2 mg total) by mouth in the morning and at bedtime. 05/19/23   Feliciana Horn, MD  sucralfate  (CARAFATE ) 1 g tablet Take 1 tablet (1 g total) by mouth in the morning and at bedtime. 05/19/23   Feliciana Horn, MD    Physical Exam   Triage Vital Signs: ED Triage Vitals  Encounter Vitals Group     BP 08/26/23 0335 112/70     Systolic BP Percentile --      Diastolic BP Percentile --      Pulse Rate 08/26/23 0335 70     Resp 08/26/23 0335 16     Temp 08/26/23 0335 98.3 F (36.8 C)     Temp Source 08/26/23 0335 Oral     SpO2 08/26/23 0335 96 %     Weight --      Height --      Head Circumference --      Peak Flow --      Pain Score 08/26/23 0331 8     Pain Loc --      Pain Education --      Exclude from Growth Chart --     Most recent vital signs: Vitals:   08/26/23 0335  BP: 112/70  Pulse: 70  Resp: 16  Temp: 98.3 F (36.8 C)  SpO2: 96%     CONSTITUTIONAL: Alert and responds appropriately to questions. Well-appearing; well-nourished HEAD: Normocephalic, atraumatic EYES: Conjunctivae clear, pupils appear equal ENT: normal nose; moist mucous membranes NECK: Normal range of motion CARD: Regular rate and rhythm RESP: Normal chest excursion without splinting or tachypnea; no hypoxia or respiratory distress, speaking full sentences ABD/GI: non-distended EXT: Tender throughout the left leg especially in  the anterior thigh without swelling, redness, asymmetry or increased warmth.  2+ left DP pulse and normal capillary refill.  Normal sensation.  No joint effusion.  No bony abnormality.  No ecchymosis.  Compartments in the left leg are soft. SKIN: Normal color for age and race, no rashes on exposed skin NEURO: Moves all extremities equally, normal speech, no facial asymmetry  noted PSYCH: The patient's mood and manner are appropriate. Grooming and personal hygiene are appropriate.  ED Results / Procedures / Treatments   LABS: (all labs ordered are listed, but only abnormal results are displayed) Labs Reviewed - No data to display   EKG:    RADIOLOGY: My personal review and interpretation of imaging: Ultrasound shows no DVT.  I have personally reviewed all radiology reports. US  Venous Img Lower Unilateral Left Result Date: 08/26/2023 CLINICAL DATA:  Left leg pain. EXAM: LEFT LOWER EXTREMITY VENOUS DOPPLER ULTRASOUND TECHNIQUE: Gray-scale sonography with graded compression, as well as color Doppler and duplex ultrasound were performed to evaluate the lower extremity deep venous systems from the level of the common femoral vein and including the common femoral, femoral, profunda femoral, popliteal and calf veins including the posterior tibial, peroneal and gastrocnemius veins when visible. The superficial great saphenous vein was also interrogated. Spectral Doppler was utilized to evaluate flow at rest and with distal augmentation maneuvers in the common femoral, femoral and popliteal veins. COMPARISON:  None Available. FINDINGS: Contralateral Common Femoral Vein: Respiratory phasicity is normal and symmetric with the symptomatic side. No evidence of thrombus. Normal compressibility. Common Femoral Vein: No evidence of thrombus. Normal compressibility, respiratory phasicity and response to augmentation. Saphenofemoral Junction: No evidence of thrombus. Normal compressibility and flow on color Doppler  imaging. Profunda Femoral Vein: No evidence of thrombus. Normal compressibility and flow on color Doppler imaging. Femoral Vein: No evidence of thrombus. Normal compressibility, respiratory phasicity and response to augmentation. Popliteal Vein: No evidence of thrombus. Normal compressibility, respiratory phasicity and response to augmentation. Calf Veins: No evidence of thrombus. Normal compressibility and flow on color Doppler imaging. Superficial Great Saphenous Vein: No evidence of thrombus. Normal compressibility. Venous Reflux:  None. Other Findings:  None. IMPRESSION: No evidence of deep venous thrombosis. Electronically Signed   By: Donnal Fusi M.D.   On: 08/26/2023 06:40     PROCEDURES:  Critical Care performed: No     Procedures    IMPRESSION / MDM / ASSESSMENT AND PLAN / ED COURSE  I reviewed the triage vital signs and the nursing notes.   Patient here with left leg pain.     DIFFERENTIAL DIAGNOSIS (includes but not limited to):   DVT, muscle strain, muscle spasm, less likely fracture.  No signs of cellulitis, septic arthritis, arterial obstruction, compartment syndrome on exam.  Could also be neurogenic or vascular claudication, lumbar radiculopathy, neuropathic pain from diabetes.  Patient's presentation is most consistent with acute complicated illness / injury requiring diagnostic workup.  PLAN: Will obtain venous Doppler.  Patient drove herself to the emergency department.  Will give Toradol  for pain.   MEDICATIONS GIVEN IN ED: Medications  diphenhydrAMINE  (BENADRYL ) capsule 50 mg (has no administration in time range)  ketorolac  (TORADOL ) 30 MG/ML injection 60 mg (60 mg Intramuscular Given 08/26/23 0620)     ED COURSE: Ultrasound reviewed and interpreted by myself and the radiologist and shows no DVT.  Will obtain x-rays of the left leg.  Patient reports feeling itching where ultrasound gel was but there is no rash.  Will give oral Benadryl .  Signed out to  oncoming ED provider at 7 AM.  Anticipate if x-rays are unremarkable that she will be discharged home with follow-up with her PCP, pain medication.   CONSULTS: Pending further workup.   OUTSIDE RECORDS REVIEWED: Reviewed last admission note in January 2025.     FINAL CLINICAL IMPRESSION(S) / ED DIAGNOSES   Final diagnoses:  Left leg  pain     Rx / DC Orders   ED Discharge Orders          Ordered    oxyCODONE -acetaminophen  (PERCOCET) 5-325 MG tablet  Every 6 hours PRN        08/26/23 0625    ondansetron  (ZOFRAN -ODT) 4 MG disintegrating tablet  Every 6 hours PRN        08/26/23 5784             Note:  This document was prepared using Dragon voice recognition software and may include unintentional dictation errors.   Shritha Bresee, Clover Dao, DO 08/26/23 [phone]

## 2023-08-26 NOTE — ED Notes (Addendum)
 Pt reporting c/o itching between legs where US  gel was placed. Area assessed. No rash or redness noted. Kristen Ward, DO notified.

## 2023-08-26 NOTE — ED Triage Notes (Addendum)
 Pt to ED via POV c/o left leg pain. Pt was seen for same Sunday. Pain has not gone away. Denies injury to leg

## 2023-08-26 NOTE — ED Provider Notes (Signed)
 7:50 AM Assumed care for off going team.   Blood pressure 112/70, pulse 70, temperature 98.3 F (36.8 C), temperature source Oral, resp. rate 16, height 5\' 6"  (1.676 m), weight 70.8 kg, SpO2 96%.  See their HPI for full report but in brief pending xrays   1. 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 Query sickle cell disease or other risk factors.   IMPRESSION: 1. Abnormal bone mineralization in the distal left tibia and talar dome, see Left Tib Fib series today. 2. Superimposed degenerated abnormal distal talus, talonavicular joint. 3. No acute osseous abnormality identified in the left foot.  1. 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 2. Superimposed substantial degenerative or perhaps also post ischemic changes also in the talar dome. 3. No acute osseous abnormality identified.   7:52 AM reevaluated patient patient is requesting to be discharged home.  Explained to patient the x-ray findings and that this is concerning for areas of the bone dying and not get enough blood flow.  Recommended discussing with orthopedics but given this was also seen back in 2015 on a x-ray this is most likely been a chronic process.  Patient's not been aware of being diagnosed with sickle cell or any other condition to cause this. Pt does not want any further workup but is willing to wait for me to talk to ortho.   7:57 AM discussed with Dr. Clyda Dark from orthopedics.  He states that these have been there previously although more advanced.  Does not need anything emergent today in the emergency room does recommend outpatient follow-up also maybe with vascular follow up. On my evaluation pt does have good distal pulses and would prefer to be dc for outpt follow up and investigation for this occurrence.      Lubertha Rush, MD 08/26/23 [phone]

## 2023-08-31 ENCOUNTER — Ambulatory Visit: Payer: MEDICAID | Admitting: Internal Medicine

## 2023-09-01 ENCOUNTER — Ambulatory Visit (INDEPENDENT_AMBULATORY_CARE_PROVIDER_SITE_OTHER): Payer: MEDICAID | Admitting: Internal Medicine

## 2023-09-01 ENCOUNTER — Telehealth: Payer: Self-pay | Admitting: Family

## 2023-09-01 ENCOUNTER — Other Ambulatory Visit (HOSPITAL_COMMUNITY): Payer: Self-pay

## 2023-09-01 ENCOUNTER — Encounter: Payer: Self-pay | Admitting: Internal Medicine

## 2023-09-01 VITALS — BP 115/80 | HR 95 | Temp 98.1°F | Resp 91 | Ht 66.0 in | Wt 157.6 lb

## 2023-09-01 DIAGNOSIS — M879 Osteonecrosis, unspecified: Secondary | ICD-10-CM | POA: Diagnosis not present

## 2023-09-01 DIAGNOSIS — B850 Pediculosis due to Pediculus humanus capitis: Secondary | ICD-10-CM | POA: Diagnosis not present

## 2023-09-01 DIAGNOSIS — E1129 Type 2 diabetes mellitus with other diabetic kidney complication: Secondary | ICD-10-CM

## 2023-09-01 DIAGNOSIS — F17200 Nicotine dependence, unspecified, uncomplicated: Secondary | ICD-10-CM

## 2023-09-01 DIAGNOSIS — E1100 Type 2 diabetes mellitus with hyperosmolarity without nonketotic hyperglycemic-hyperosmolar coma (NKHHC): Secondary | ICD-10-CM

## 2023-09-01 DIAGNOSIS — Z0130 Encounter for examination of blood pressure without abnormal findings: Secondary | ICD-10-CM

## 2023-09-01 DIAGNOSIS — R809 Proteinuria, unspecified: Secondary | ICD-10-CM

## 2023-09-01 LAB — POC CREATINE & ALBUMIN,URINE
Creatinine, POC: 150 mg/dL
Microalbumin Ur, POC: 200 mg/L

## 2023-09-01 LAB — GLUCOSE, POCT (MANUAL RESULT ENTRY): POC Glucose: 224 mg/dL — AB (ref 70–99)

## 2023-09-01 MED ORDER — DICLOFENAC POTASSIUM 50 MG PO TABS: 50.0000 mg | ORAL_TABLET | Freq: Three times a day (TID) | ORAL | 0 refills | Status: DC | PRN

## 2023-09-01 MED ORDER — PERMETHRIN 0.5 % AERO: INHALATION_SPRAY | Freq: Once | 0 refills | Status: DC

## 2023-09-01 MED ORDER — EMPAGLIFLOZIN 10 MG PO TABS: 10.0000 mg | ORAL_TABLET | Freq: Every day | ORAL | 0 refills | Status: DC

## 2023-09-01 MED ORDER — HYDROCODONE-ACETAMINOPHEN 5-325 MG PO TABS: 1.0000 | ORAL_TABLET | Freq: Three times a day (TID) | ORAL | 0 refills | Status: AC | PRN

## 2023-09-01 NOTE — Telephone Encounter (Signed)
 Patient called stating that her rx's were to be sent to the [facility] in [place]. There were 4 different rx's Dr.Tejan Sie sent in for her.

## 2023-09-01 NOTE — Progress Notes (Signed)
 Established Patient Office Visit  Subjective:  Patient ID: Shawna [name], female    DOB: 13-May-1965  Age: 58 y.o. 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 Still c/o pain in her left leg. Also found to have head lice while being roomed.    No other concerns at this time.   Past Medical History:  Diagnosis Date   Anxiety    Asthma    Bulging lumbar disc    Bulging lumbar disc    COPD (chronic obstructive pulmonary disease) (HCC)    GERD (gastroesophageal reflux disease)    Homelessness 11/09/2022   Insomnia    Kidney stone    Pneumonia 03/2015   Sciatic leg pain    Right side   Sciatica of right side    Weakness    Bil knees, and ankles    Past Surgical History:  Procedure Laterality Date   ABDOMINAL HYSTERECTOMY     Partial   ESOPHAGOGASTRODUODENOSCOPY (EGD) WITH PROPOFOL  N/A 11/27/2015   Procedure: ESOPHAGOGASTRODUODENOSCOPY (EGD) WITH PROPOFOL ;  Surgeon: Deveron Fly, MD;  Location: [facility] ENDOSCOPY;  Service: Endoscopy;  Laterality: N/A;   SHOULDER SURGERY Right    TONSILLECTOMY     TUBAL LIGATION      Social History   Socioeconomic History   Marital status: Single    Spouse name: Not on file   Number of children: Not on file   Years of education: Not on file   Highest education level: Not on file  Occupational History   Not on file  Tobacco Use   Smoking status: Every Day    Current packs/day: 1.00    Average packs/day: 1 pack/day for 40.0 years (40.0 ttl pk-yrs)    Types: Cigarettes   Smokeless tobacco: Current  Vaping Use   Vaping status: Some Days   Substances: Nicotine , Flavoring  Substance and Sexual Activity   Alcohol use: Yes    Comment: occ   Drug use: Yes    Types: Marijuana    Comment: once in while   Sexual activity: Not Currently  Other Topics Concern   Not on file  Social History  Narrative   Not on file   Social Drivers of Health   Financial Resource Strain: Not on file  Food Insecurity: No Food Insecurity (05/17/2023)   Hunger Vital Sign    Worried About Running Out of Food in the Last Year: Never true    Ran Out of Food in the Last Year: Never true  Transportation Needs: No Transportation Needs (05/17/2023)   PRAPARE - Administrator, Civil Service (Medical): No    Lack of Transportation (Non-Medical): No  Physical Activity: Not on file  Stress: Not on file  Social Connections: Not on file  Intimate Partner Violence: Not At Risk (05/17/2023)   Humiliation, Afraid, Rape, and Kick questionnaire    Fear of Current or Ex-Partner: No    Emotionally Abused: No    Physically Abused: No    Sexually Abused: No    Family History  Problem Relation Age of Onset   Fibromyalgia Mother     Allergies  Allergen Reactions   Haloperidol      Made pt "feel really weird".     Hydrocodone -Acetaminophen  Nausea And Vomiting and Nausea Only   Morphine  Nausea And Vomiting  Patient states this medication makes her have sever flu symptoms.   Morphine  And Codeine Nausea And Vomiting    Patient states this medication makes her have sever flu symptoms.   Pravastatin     CHEST PAIN   Pravastatin Sodium     Unknown    Promethazine  Hcl     Unknown    Fentanyl  Nausea And Vomiting and Other (See Comments)    Patient states this makes her have severe flu symptoms.   Propofol  Nausea And Vomiting    Outpatient Medications Prior to Visit  Medication Sig   albuterol  (VENTOLIN  HFA) 108 (90 Base) MCG/ACT inhaler Inhale 2 puffs into the lungs every 6 (six) hours as needed for wheezing or shortness of breath.   Blood Glucose Monitoring Suppl (ACCU-CHEK GUIDE) w/Device KIT Use in the morning, at noon, and at bedtime.   clonazePAM  (KLONOPIN ) 1 MG tablet Take 1 mg by mouth 2 (two) times daily as needed for anxiety.   dexlansoprazole  (DEXILANT ) 60 MG capsule Take 1 capsule  (60 mg total) by mouth daily.   dicyclomine  (BENTYL ) 20 MG tablet Take 1 tablet (20 mg total) by mouth 2 (two) times daily.   famotidine  (PEPCID ) 20 MG tablet Take 1 tablet (20 mg total) by mouth daily.   gabapentin  (NEURONTIN ) 300 MG capsule Take 1 capsule (300 mg total) by mouth 3 (three) times daily.   hydrOXYzine  (ATARAX ) 50 MG tablet Take 1-2 tablets (50-100 mg total) by mouth at bedtime as needed for anxiety (sleep).   insulin  aspart (NOVOLOG ) 100 UNIT/ML FlexPen Use as directed per sliding scale. CBG 70 - 120: 0 units. CBG 121 - 150: 1 unit. CBG 151 - 200: 2 units. CBG 201 - 250: 3 units. CBG 251 - 300: 5 units. CBG 301 - 350: 7 units. CBG 351 - 400: 9 units   Insulin  Pen Needle 31G X 6 MM MISC Use to inject insulin  as directed 3 (three) times daily before meals.   ipratropium-albuterol  (DUONEB) 0.5-2.5 (3) MG/3ML SOLN Take 3 mLs by nebulization every 6 (six) hours as needed.   lidocaine  (XYLOCAINE ) 5 % ointment Apply 1 Application topically daily as needed for mild pain (pain score 1-3) or moderate pain (pain score 4-6).   MAGNESIUM  PO Take 1 tablet by mouth daily.   ondansetron  (ZOFRAN -ODT) 4 MG disintegrating tablet Take 1 tablet (4 mg total) by mouth every 6 (six) hours as needed for nausea or vomiting.   oxyCODONE -acetaminophen  (PERCOCET) 5-325 MG tablet Take 1 tablet by mouth every 6 (six) hours as needed for severe pain (pain score 7-10).   POTASSIUM PO Take 1 tablet by mouth daily.   rOPINIRole  (REQUIP ) 2 MG tablet Take 1 tablet (2 mg total) by mouth in the morning and at bedtime.   sucralfate  (CARAFATE ) 1 g tablet Take 1 tablet (1 g total) by mouth in the morning and at bedtime.   atorvastatin  (LIPITOR) 10 MG tablet Take 1 tablet (10 mg total) by mouth at bedtime. (Patient not taking: Reported on 09/01/2023)   guaiFENesin  (MUCINEX ) 600 MG 12 hr tablet Take 1 tablet (600 mg total) by mouth 2 (two) times daily as needed. (Patient not taking: Reported on 09/01/2023)   Menthol-Methyl  Salicylate (MUSCLE RUB) 10-15 % CREA Apply 1 Application topically as needed. (Patient not taking: Reported on 09/01/2023)   metFORMIN  (GLUCOPHAGE -XR) 500 MG 24 hr tablet Take 1 tablet (500 mg total) by mouth 2 (two) times daily with a meal. (Patient not taking: Reported on 09/01/2023)  nicotine  (NICODERM CQ  - DOSED IN MG/24 HOURS) 14 mg/24hr patch Place 1 patch (14 mg total) onto the skin daily. (Patient not taking: Reported on 09/01/2023)   No facility-administered medications prior to visit.    Review of Systems  Musculoskeletal:        Left leg pain  Skin:  Positive for itching.  All other systems reviewed and are negative.      Objective:   BP 115/80   Pulse 95   Temp 98.1 F (36.7 C)   Resp (!) 91   Ht 5\' 6"  (1.676 m)   Wt 157 lb 9.6 oz (71.5 kg)   BMI 25.44 kg/m   Vitals:   09/01/23 1130  BP: 115/80  Pulse: 95  Temp: 98.1 F (36.7 C)  Resp: (!) 91  Height: 5\' 6"  (1.676 m)  Weight: 157 lb 9.6 oz (71.5 kg)  BMI (Calculated): 25.45    Physical Exam Vitals reviewed.  Constitutional:      General: She is not in acute distress. HENT:     Head: Normocephalic.     Nose: Nose normal.     Mouth/Throat:     Mouth: Mucous membranes are moist.  Eyes:     Extraocular Movements: Extraocular movements intact.     Pupils: Pupils are equal, round, and reactive to light.  Cardiovascular:     Rate and Rhythm: Normal rate and regular rhythm.     Heart sounds: No murmur heard. Pulmonary:     Effort: Pulmonary effort is normal.     Breath sounds: No rhonchi or rales.  Abdominal:     General: Abdomen is flat.     Palpations: There is no hepatomegaly, splenomegaly or mass.  Musculoskeletal:        General: Normal range of motion.     Cervical back: Normal range of motion. No tenderness.  Skin:    General: Skin is warm and dry.  Neurological:     General: No focal deficit present.     Mental Status: She is alert and oriented to person, place, and time.     Cranial Nerves:  No cranial nerve deficit.     Motor: No weakness.  Psychiatric:        Mood and Affect: Mood normal.        Behavior: Behavior normal.      Results for orders placed or performed in visit on 09/01/23  POCT Glucose (CBG)  Result Value Ref Range   POC Glucose 224 (A) 70 - 99 mg/dl  POC CREATINE & ALBUMIN,URINE  Result Value Ref Range   Microalbumin Ur, POC 200 mg/L   Creatinine, POC 150 mg/dL   Albumin/Creatinine Ratio, Urine, POC 30-300         Assessment & Plan:  As per problem list.  Problem List Items Addressed This Visit       Endocrine   DM2 (diabetes mellitus, type 2) (HCC) - Primary   Relevant Orders   POCT Glucose (CBG) (Completed)   POC CREATINE & ALBUMIN,URINE (Completed)   Other Visit Diagnoses       Lice infested hair       Relevant Medications   pyrethrins-piperonyl butoxide 0.5 % bottle     Bone necrosis (HCC)       Relevant Medications   HYDROcodone -acetaminophen  (NORCO/VICODIN) 5-325 MG tablet   diclofenac (CATAFLAM) 50 MG tablet   Other Relevant Orders   Ambulatory referral to Vascular Surgery   Sickle Cell Analysis   ANA 12 Profile,  Do All RDL   US  ARTERIAL ABI (SCREENING LOWER EXTREMITY)       Return in about 3 weeks (around 09/22/2023).   Total time spent: 30 minutes  Arzella Bitters, MD  09/01/2023   This document may have been prepared by [facility] Voice Recognition software and as such may include unintentional dictation errors.

## 2023-09-02 ENCOUNTER — Other Ambulatory Visit: Payer: Self-pay

## 2023-09-02 DIAGNOSIS — M879 Osteonecrosis, unspecified: Secondary | ICD-10-CM

## 2023-09-02 DIAGNOSIS — E1129 Type 2 diabetes mellitus with other diabetic kidney complication: Secondary | ICD-10-CM

## 2023-09-02 MED ORDER — EMPAGLIFLOZIN 10 MG PO TABS: 10.0000 mg | ORAL_TABLET | Freq: Every day | ORAL | 0 refills | Status: AC

## 2023-09-02 MED ORDER — DICLOFENAC POTASSIUM 50 MG PO TABS
50.0000 mg | ORAL_TABLET | Freq: Three times a day (TID) | ORAL | 0 refills | Status: AC | PRN
Start: 2023-09-02 — End: 2023-10-02

## 2023-09-02 NOTE — Telephone Encounter (Signed)
 Rx's have been sent.

## 2023-09-03 ENCOUNTER — Other Ambulatory Visit: Payer: Self-pay

## 2023-09-03 DIAGNOSIS — B850 Pediculosis due to Pediculus humanus capitis: Secondary | ICD-10-CM

## 2023-09-03 MED ORDER — NATROBA 0.9 % EX SUSP: 120.0000 mL | Freq: Once | CUTANEOUS | 0 refills | Status: AC

## 2023-09-03 MED ORDER — PERMETHRIN 0.5 % AERO
INHALATION_SPRAY | Freq: Once | 0 refills | Status: AC
Start: 2023-09-03 — End: 2023-09-03

## 2023-09-03 MED ORDER — ONDANSETRON 4 MG PO TBDP: 4.0000 mg | ORAL_TABLET | Freq: Four times a day (QID) | ORAL | 1 refills | Status: DC | PRN

## 2023-09-06 ENCOUNTER — Telehealth: Payer: Self-pay

## 2023-09-06 ENCOUNTER — Ambulatory Visit: Admission: RE | Admit: 2023-09-06 | Payer: MEDICAID | Source: Ambulatory Visit

## 2023-09-06 NOTE — Telephone Encounter (Signed)
 Beth with [employer] called to inform you that the test code you put in was for a "fetal sickle cell code" she has changed it to this code which is the correct one just for your reference [phone]

## 2023-09-07 ENCOUNTER — Telehealth: Payer: Self-pay

## 2023-09-07 ENCOUNTER — Other Ambulatory Visit: Payer: Self-pay

## 2023-09-07 ENCOUNTER — Emergency Department
Admission: EM | Admit: 2023-09-07 | Discharge: 2023-09-07 | Discharge status: Against Medical Advice | Payer: MEDICAID | Attending: Emergency Medicine | Admitting: Emergency Medicine

## 2023-09-07 DIAGNOSIS — M79672 Pain in left foot: Secondary | ICD-10-CM | POA: Diagnosis not present

## 2023-09-07 DIAGNOSIS — Z5321 Procedure and treatment not carried out due to patient leaving prior to being seen by health care provider: Secondary | ICD-10-CM | POA: Diagnosis not present

## 2023-09-07 DIAGNOSIS — R6883 Chills (without fever): Secondary | ICD-10-CM | POA: Diagnosis not present

## 2023-09-07 DIAGNOSIS — R200 Anesthesia of skin: Secondary | ICD-10-CM | POA: Diagnosis present

## 2023-09-07 DIAGNOSIS — M7989 Other specified soft tissue disorders: Secondary | ICD-10-CM | POA: Insufficient documentation

## 2023-09-07 LAB — CBC
HCT: 40.5 % (ref 36.0–46.0)
Hemoglobin: 13.6 g/dL (ref 12.0–15.0)
MCH: 29.5 pg (ref 26.0–34.0)
MCHC: 33.6 g/dL (ref 30.0–36.0)
MCV: 87.9 fL (ref 80.0–100.0)
Platelets: 379 10*3/uL (ref 150–400)
RBC: 4.61 MIL/uL (ref 3.87–5.11)
RDW: 14 % (ref 11.5–15.5)
WBC: 9.8 10*3/uL (ref 4.0–10.5)
nRBC: 0 % (ref 0.0–0.2)

## 2023-09-07 LAB — COMPREHENSIVE METABOLIC PANEL WITH GFR
ALT: 17 U/L (ref 0–44)
AST: 21 U/L (ref 15–41)
Albumin: 3.6 g/dL (ref 3.5–5.0)
Alkaline Phosphatase: 96 U/L (ref 38–126)
Anion gap: 9 (ref 5–15)
BUN: 7 mg/dL (ref 6–20)
CO2: 24 mmol/L (ref 22–32)
Calcium: 9.4 mg/dL (ref 8.9–10.3)
Chloride: 104 mmol/L (ref 98–111)
Creatinine, Ser: 0.67 mg/dL (ref 0.44–1.00)
GFR, Estimated: >60 mL/min (ref >60)
Glucose, Bld: 178 mg/dL — ABNORMAL HIGH (ref 70–99)
Potassium: 4 mmol/L (ref 3.5–5.1)
Sodium: 137 mmol/L (ref 135–145)
Total Bilirubin: 0.8 mg/dL (ref 0.0–1.2)
Total Protein: 7.2 g/dL (ref 6.5–8.1)

## 2023-09-07 LAB — CBG MONITORING, ED: Glucose-Capillary: 178 mg/dL — ABNORMAL HIGH (ref 70–99)

## 2023-09-07 NOTE — Telephone Encounter (Signed)
 Patient called  to let you know she didn't make it to the US  this morning she said she can't walk her foot is swollen.

## 2023-09-07 NOTE — ED Triage Notes (Signed)
 Pt here with numbness and swelling to her left foot since this morning. Pt states it is painful and numb to touch. Pt also endorses chills.

## 2023-09-08 ENCOUNTER — Telehealth: Payer: Self-pay | Admitting: Internal Medicine

## 2023-09-08 ENCOUNTER — Other Ambulatory Visit: Payer: Self-pay | Admitting: Internal Medicine

## 2023-09-08 NOTE — Telephone Encounter (Signed)
 Pt states she lvm on nurses line this morning, hasn't heard anything back.   Called again to let TJ know that she would like something sent in for the pain pill withdrawals.  She's been trying to taper herself off of the HYDROCODONE  by cutting them in fourths, but is starting to get sick because of the withdrawls.  Good pharm is [facility] in [place]   Please advise

## 2023-09-09 ENCOUNTER — Telehealth: Payer: Self-pay | Admitting: Internal Medicine

## 2023-09-09 ENCOUNTER — Ambulatory Visit: Admitting: "Endocrinology

## 2023-09-09 NOTE — Telephone Encounter (Signed)
 Entered in error

## 2023-09-13 NOTE — Telephone Encounter (Signed)
 Patient was advised that she needed to go to the ED if she felt like she as going through withdrawls

## 2023-09-15 ENCOUNTER — Ambulatory Visit: Payer: MEDICAID | Admitting: Internal Medicine

## 2023-09-15 ENCOUNTER — Ambulatory Visit: Payer: Self-pay | Admitting: Internal Medicine

## 2023-09-15 VITALS — BP 118/80 | HR 105 | Temp 97.5°F | Ht 66.0 in | Wt 157.2 lb

## 2023-09-15 DIAGNOSIS — D8989 Other specified disorders involving the immune mechanism, not elsewhere classified: Secondary | ICD-10-CM

## 2023-09-15 DIAGNOSIS — E1100 Type 2 diabetes mellitus with hyperosmolarity without nonketotic hyperglycemic-hyperosmolar coma (NKHHC): Secondary | ICD-10-CM

## 2023-09-15 DIAGNOSIS — Z0130 Encounter for examination of blood pressure without abnormal findings: Secondary | ICD-10-CM

## 2023-09-15 DIAGNOSIS — M879 Osteonecrosis, unspecified: Secondary | ICD-10-CM

## 2023-09-15 LAB — SICKLE CELL ANALYSIS

## 2023-09-15 LAB — GLUCOSE, POCT (MANUAL RESULT ENTRY): POC Glucose: 173 mg/dL — AB (ref 70–99)

## 2023-09-15 LAB — ANA 12 PROFILE, DO ALL RDL
Anti-Cardiolipin Ab, IgA (RDL): <12 U/mL (ref <12)
Anti-Cardiolipin Ab, IgG (RDL): <15 GPL U/mL (ref <15)
Anti-Cardiolipin Ab, IgM (RDL): <13 [MPL'U]/mL (ref <13)
Anti-Centromere Ab (RDL): <1:40 {titer}
Anti-La (SS-B) Ab (RDL): <20 U (ref <20)
Anti-Nuclear Ab by IFA (RDL): POSITIVE — AB
Anti-Ro (SS-A) Ab (RDL): <20 U (ref <20)
Anti-Scl-70 Ab (RDL): <20 U (ref <20)
Anti-Sm Ab (RDL): <20 U (ref <20)
Anti-TPO Ab (RDL): 11.7 [IU]/mL — ABNORMAL HIGH (ref <9.0)
Anti-U1 RNP Ab (RDL): <20 U (ref <20)
Anti-dsDNA Ab by Farr(RDL): <8 [IU]/mL (ref <8.0)
C3 Complement (RDL): 222 mg/dL — ABNORMAL HIGH (ref 90–180)
C4 Complement (RDL): 34 mg/dL (ref 10–40)

## 2023-09-15 LAB — ANA TITER AND PATTERN: Speckled Pattern: 1:80 {titer} — ABNORMAL HIGH

## 2023-09-15 NOTE — Progress Notes (Signed)
 Established Patient Office Visit  Subjective:  Patient ID: Shawna [name], female    DOB: 1965-08-14  Age: 58 y.o. MRN: [id number]  Chief Complaint  Patient presents with   Follow-up    3 week follow up    No new complaints, here for lab review and medication refills. Labs reviewed and notable for  positive ana and tpo while sickle cell screen was negative.    No other concerns at this time.   Past Medical History:  Diagnosis Date   Anxiety    Asthma    Bulging lumbar disc    Bulging lumbar disc    COPD (chronic obstructive pulmonary disease) (HCC)    GERD (gastroesophageal reflux disease)    Homelessness 11/09/2022   Insomnia    Kidney stone    Pneumonia 03/2015   Sciatic leg pain    Right side   Sciatica of right side    Weakness    Bil knees, and ankles    Past Surgical History:  Procedure Laterality Date   ABDOMINAL HYSTERECTOMY     Partial   ESOPHAGOGASTRODUODENOSCOPY (EGD) WITH PROPOFOL  N/A 11/27/2015   Procedure: ESOPHAGOGASTRODUODENOSCOPY (EGD) WITH PROPOFOL ;  Surgeon: Deveron Fly, MD;  Location: [facility] ENDOSCOPY;  Service: Endoscopy;  Laterality: N/A;   SHOULDER SURGERY Right    TONSILLECTOMY     TUBAL LIGATION      Social History   Socioeconomic History   Marital status: Single    Spouse name: Not on file   Number of children: Not on file   Years of education: Not on file   Highest education level: Not on file  Occupational History   Not on file  Tobacco Use   Smoking status: Every Day    Current packs/day: 1.00    Average packs/day: 1 pack/day for 40.0 years (40.0 ttl pk-yrs)    Types: Cigarettes   Smokeless tobacco: Current  Vaping Use   Vaping status: Some Days   Substances: Nicotine , Flavoring  Substance and Sexual Activity   Alcohol use: Yes    Comment: occ   Drug use: Yes    Types: Marijuana    Comment: once in while   Sexual activity: Not Currently  Other Topics Concern   Not on file  Social History Narrative   Not on  file   Social Drivers of Health   Financial Resource Strain: Not on file  Food Insecurity: No Food Insecurity (05/17/2023)   Hunger Vital Sign    Worried About Running Out of Food in the Last Year: Never true    Ran Out of Food in the Last Year: Never true  Transportation Needs: No Transportation Needs (05/17/2023)   PRAPARE - Administrator, Civil Service (Medical): No    Lack of Transportation (Non-Medical): No  Physical Activity: Not on file  Stress: Not on file  Social Connections: Not on file  Intimate Partner Violence: Not At Risk (05/17/2023)   Humiliation, Afraid, Rape, and Kick questionnaire    Fear of Current or Ex-Partner: No    Emotionally Abused: No    Physically Abused: No    Sexually Abused: No    Family History  Problem Relation Age of Onset   Fibromyalgia Mother     Allergies  Allergen Reactions   Haloperidol      Made pt "feel really weird".     Hydrocodone -Acetaminophen  Nausea And Vomiting and Nausea Only   Morphine  Nausea And Vomiting    Patient states this medication makes  her have sever flu symptoms.   Morphine  And Codeine Nausea And Vomiting    Patient states this medication makes her have sever flu symptoms.   Pravastatin     CHEST PAIN   Pravastatin Sodium     Unknown    Promethazine  Hcl     Unknown    Fentanyl  Nausea And Vomiting and Other (See Comments)    Patient states this makes her have severe flu symptoms.   Propofol  Nausea And Vomiting    Outpatient Medications Prior to Visit  Medication Sig   albuterol  (VENTOLIN  HFA) 108 (90 Base) MCG/ACT inhaler Inhale 2 puffs into the lungs every 6 (six) hours as needed for wheezing or shortness of breath.   Blood Glucose Monitoring Suppl (ACCU-CHEK GUIDE) w/Device KIT Use in the morning, at noon, and at bedtime.   clonazePAM  (KLONOPIN ) 1 MG tablet Take 1 mg by mouth 2 (two) times daily as needed for anxiety.   dexlansoprazole  (DEXILANT ) 60 MG capsule Take 1 capsule (60 mg total) by  mouth daily.   diclofenac  (CATAFLAM ) 50 MG tablet Take 1 tablet (50 mg total) by mouth 3 (three) times daily as needed.   dicyclomine  (BENTYL ) 20 MG tablet Take 1 tablet (20 mg total) by mouth 2 (two) times daily.   empagliflozin  (JARDIANCE ) 10 MG TABS tablet Take 1 tablet (10 mg total) by mouth daily before breakfast.   famotidine  (PEPCID ) 20 MG tablet Take 1 tablet (20 mg total) by mouth daily.   gabapentin  (NEURONTIN ) 300 MG capsule Take 1 capsule (300 mg total) by mouth 3 (three) times daily.   HYDROcodone -acetaminophen  (NORCO/VICODIN) 5-325 MG tablet Take 1 tablet by mouth every 8 (eight) hours as needed for up to 20 days for severe pain (pain score 7-10).   hydrOXYzine  (ATARAX ) 50 MG tablet Take 1-2 tablets (50-100 mg total) by mouth at bedtime as needed for anxiety (sleep).   insulin  aspart (NOVOLOG ) 100 UNIT/ML FlexPen Use as directed per sliding scale. CBG 70 - 120: 0 units. CBG 121 - 150: 1 unit. CBG 151 - 200: 2 units. CBG 201 - 250: 3 units. CBG 251 - 300: 5 units. CBG 301 - 350: 7 units. CBG 351 - 400: 9 units   Insulin  Pen Needle 31G X 6 MM MISC Use to inject insulin  as directed 3 (three) times daily before meals.   ipratropium-albuterol  (DUONEB) 0.5-2.5 (3) MG/3ML SOLN Take 3 mLs by nebulization every 6 (six) hours as needed.   lidocaine  (XYLOCAINE ) 5 % ointment Apply 1 Application topically daily as needed for mild pain (pain score 1-3) or moderate pain (pain score 4-6).   MAGNESIUM  PO Take 1 tablet by mouth daily.   Menthol-Methyl Salicylate (MUSCLE RUB) 10-15 % CREA Apply 1 Application topically as needed.   ondansetron  (ZOFRAN -ODT) 4 MG disintegrating tablet DISSOLVE 1 TABLET ON THE TONGUE EVERY 6 HOURS AS NEEDED FOR NAUSEA AND VOMITING   POTASSIUM PO Take 1 tablet by mouth daily.   rOPINIRole  (REQUIP ) 2 MG tablet Take 1 tablet (2 mg total) by mouth in the morning and at bedtime.   sucralfate  (CARAFATE ) 1 g tablet Take 1 tablet (1 g total) by mouth in the morning and at bedtime.    atorvastatin  (LIPITOR) 10 MG tablet Take 1 tablet (10 mg total) by mouth at bedtime. (Patient not taking: Reported on 09/15/2023)   guaiFENesin  (MUCINEX ) 600 MG 12 hr tablet Take 1 tablet (600 mg total) by mouth 2 (two) times daily as needed. (Patient not taking: Reported on 09/15/2023)  metFORMIN  (GLUCOPHAGE -XR) 500 MG 24 hr tablet Take 1 tablet (500 mg total) by mouth 2 (two) times daily with a meal. (Patient not taking: Reported on 09/15/2023)   nicotine  (NICODERM CQ  - DOSED IN MG/24 HOURS) 14 mg/24hr patch Place 1 patch (14 mg total) onto the skin daily. (Patient not taking: Reported on 09/15/2023)   [DISCONTINUED] oxyCODONE -acetaminophen  (PERCOCET) 5-325 MG tablet Take 1 tablet by mouth every 6 (six) hours as needed for severe pain (pain score 7-10).   No facility-administered medications prior to visit.    Review of Systems  Musculoskeletal:        Left leg pain  All other systems reviewed and are negative.      Objective:   BP 118/80   Pulse (!) 105   Temp (!) 97.5 F (36.4 C)   Ht 5\' 6"  (1.676 m)   Wt 157 lb 3.2 oz (71.3 kg)   SpO2 94%   BMI 25.37 kg/m   Vitals:   09/15/23 0850  BP: 118/80  Pulse: (!) 105  Temp: (!) 97.5 F (36.4 C)  Height: 5\' 6"  (1.676 m)  Weight: 157 lb 3.2 oz (71.3 kg)  SpO2: 94%  BMI (Calculated): 25.38    Physical Exam Vitals reviewed.  Constitutional:      General: She is not in acute distress. HENT:     Head: Normocephalic.     Nose: Nose normal.     Mouth/Throat:     Mouth: Mucous membranes are moist.  Eyes:     Extraocular Movements: Extraocular movements intact.     Pupils: Pupils are equal, round, and reactive to light.  Cardiovascular:     Rate and Rhythm: Normal rate and regular rhythm.     Heart sounds: No murmur heard. Pulmonary:     Effort: Pulmonary effort is normal.     Breath sounds: No rhonchi or rales.  Abdominal:     General: Abdomen is flat.     Palpations: There is no hepatomegaly, splenomegaly or mass.   Musculoskeletal:        General: Normal range of motion.     Cervical back: Normal range of motion. No tenderness.  Skin:    General: Skin is warm and dry.  Neurological:     General: No focal deficit present.     Mental Status: She is alert and oriented to person, place, and time.     Cranial Nerves: No cranial nerve deficit.     Motor: No weakness.  Psychiatric:        Mood and Affect: Mood normal.        Behavior: Behavior normal.      Results for orders placed or performed in visit on 09/15/23  POCT Glucose (CBG)  Result Value Ref Range   POC Glucose 173 (A) 70 - 99 mg/dl    Recent Results (from the past 2160 hours)  Resp panel by RT-PCR (RSV, Flu A&B, Covid) Anterior Nasal Swab     Status: None   Collection Time: 07/18/23  9:41 PM   Specimen: Anterior Nasal Swab  Result Value Ref Range   SARS Coronavirus 2 by RT PCR NEGATIVE NEGATIVE    Comment: (NOTE) SARS-CoV-2 target nucleic acids are NOT DETECTED.  The SARS-CoV-2 RNA is generally detectable in upper respiratory specimens during the acute phase of infection. The lowest concentration of SARS-CoV-2 viral copies this assay can detect is 138 copies/mL. A negative result does not preclude SARS-Cov-2 infection and should not be used as the sole  basis for treatment or other patient management decisions. A negative result may occur with  improper specimen collection/handling, submission of specimen other than nasopharyngeal swab, presence of viral mutation(Blaze Sandin) within the areas targeted by this assay, and inadequate number of viral copies(<138 copies/mL). A negative result must be combined with clinical observations, patient history, and epidemiological information. The expected result is Negative.  Fact Sheet for Patients:  BloggerCourse.com  Fact Sheet for Healthcare Providers:  SeriousBroker.it  This test is no t yet approved or cleared by the [place]  FDA and   has been authorized for detection and/or diagnosis of SARS-CoV-2 by FDA under an Emergency Use Authorization (EUA). This EUA will remain  in effect (meaning this test can be used) for the duration of the COVID-19 declaration under Section 564(b)(1) of the Act, 21 U.Charleen Madera.C.section 360bbb-3(b)(1), unless the authorization is terminated  or revoked sooner.       Influenza A by PCR NEGATIVE NEGATIVE   Influenza B by PCR NEGATIVE NEGATIVE    Comment: (NOTE) The Xpert Xpress SARS-CoV-2/FLU/RSV plus assay is intended as an aid in the diagnosis of influenza from Nasopharyngeal swab specimens and should not be used as a sole basis for treatment. Nasal washings and aspirates are unacceptable for Xpert Xpress SARS-CoV-2/FLU/RSV testing.  Fact Sheet for Patients: BloggerCourse.com  Fact Sheet for Healthcare Providers: SeriousBroker.it  This test is not yet approved or cleared by the [place]  FDA and has been authorized for detection and/or diagnosis of SARS-CoV-2 by FDA under an Emergency Use Authorization (EUA). This EUA will remain in effect (meaning this test can be used) for the duration of the COVID-19 declaration under Section 564(b)(1) of the Act, 21 U.Zaydrian Batta.C. section 360bbb-3(b)(1), unless the authorization is terminated or revoked.     Resp Syncytial Virus by PCR NEGATIVE NEGATIVE    Comment: (NOTE) Fact Sheet for Patients: BloggerCourse.com  Fact Sheet for Healthcare Providers: SeriousBroker.it  This test is not yet approved or cleared by the [place]  FDA and has been authorized for detection and/or diagnosis of SARS-CoV-2 by FDA under an Emergency Use Authorization (EUA). This EUA will remain in effect (meaning this test can be used) for the duration of the COVID-19 declaration under Section 564(b)(1) of the Act, 21 U.Soliyana Mcchristian.C. section 360bbb-3(b)(1), unless the  authorization is terminated or revoked.  Performed at [facility], [address] [place], [place] [address]   Resp panel by RT-PCR (RSV, Flu A&B, Covid) Anterior Nasal Swab     Status: None   Collection Time: 07/29/23  4:30 AM   Specimen: Anterior Nasal Swab  Result Value Ref Range   SARS Coronavirus 2 by RT PCR NEGATIVE NEGATIVE    Comment: (NOTE) SARS-CoV-2 target nucleic acids are NOT DETECTED.  The SARS-CoV-2 RNA is generally detectable in upper respiratory specimens during the acute phase of infection. The lowest concentration of SARS-CoV-2 viral copies this assay can detect is 138 copies/mL. A negative result does not preclude SARS-Cov-2 infection and should not be used as the sole basis for treatment or other patient management decisions. A negative result may occur with  improper specimen collection/handling, submission of specimen other than nasopharyngeal swab, presence of viral mutation(Sheilah Rayos) within the areas targeted by this assay, and inadequate number of viral copies(<138 copies/mL). A negative result must be combined with clinical observations, patient history, and epidemiological information. The expected result is Negative.  Fact Sheet for Patients:  BloggerCourse.com  Fact Sheet for Healthcare Providers:  SeriousBroker.it  This test is no t yet approved  or cleared by the [place]  FDA and  has been authorized for detection and/or diagnosis of SARS-CoV-2 by FDA under an Emergency Use Authorization (EUA). This EUA will remain  in effect (meaning this test can be used) for the duration of the COVID-19 declaration under Section 564(b)(1) of the Act, 21 U.Zaide Mcclenahan.C.section 360bbb-3(b)(1), unless the authorization is terminated  or revoked sooner.       Influenza A by PCR NEGATIVE NEGATIVE   Influenza B by PCR NEGATIVE NEGATIVE    Comment: (NOTE) The Xpert Xpress SARS-CoV-2/FLU/RSV plus assay is  intended as an aid in the diagnosis of influenza from Nasopharyngeal swab specimens and should not be used as a sole basis for treatment. Nasal washings and aspirates are unacceptable for Xpert Xpress SARS-CoV-2/FLU/RSV testing.  Fact Sheet for Patients: BloggerCourse.com  Fact Sheet for Healthcare Providers: SeriousBroker.it  This test is not yet approved or cleared by the [place]  FDA and has been authorized for detection and/or diagnosis of SARS-CoV-2 by FDA under an Emergency Use Authorization (EUA). This EUA will remain in effect (meaning this test can be used) for the duration of the COVID-19 declaration under Section 564(b)(1) of the Act, 21 U.Rooney Gladwin.C. section 360bbb-3(b)(1), unless the authorization is terminated or revoked.     Resp Syncytial Virus by PCR NEGATIVE NEGATIVE    Comment: (NOTE) Fact Sheet for Patients: BloggerCourse.com  Fact Sheet for Healthcare Providers: SeriousBroker.it  This test is not yet approved or cleared by the [place]  FDA and has been authorized for detection and/or diagnosis of SARS-CoV-2 by FDA under an Emergency Use Authorization (EUA). This EUA will remain in effect (meaning this test can be used) for the duration of the COVID-19 declaration under Section 564(b)(1) of the Act, 21 U.Alicianna Litchford.C. section 360bbb-3(b)(1), unless the authorization is terminated or revoked.  Performed at [facility], [address] [place], [place] [address]   POCT Glucose (CBG)     Status: Abnormal   Collection Time: 09/01/23 11:39 AM  Result Value Ref Range   POC Glucose 224 (A) 70 - 99 mg/dl  POC CREATINE & ALBUMIN,URINE     Status: Abnormal   Collection Time: 09/01/23 11:45 AM  Result Value Ref Range   Microalbumin Ur, POC 200 mg/L   Creatinine, POC 150 mg/dL   Albumin/Creatinine Ratio, Urine, POC 30-300   Sickle Cell Analysis     Status:  None   Collection Time: 09/01/23 12:35 PM  Result Value Ref Range   Result: CANCELED     Comment: Test not performed. Test cancelled by Healthcare provider after order was submitted to [facility]. Per Kathlyn Parcel at the client.  Result canceled by the ancillary.   ANA 12 Profile, Do All RDL     Status: Abnormal   Collection Time: 09/01/23 12:35 PM  Result Value Ref Range   Anti-Nuclear Ab by IFA (RDL) Positive (A) Negative   Anti-Centromere Ab (RDL) <1:40 <1:40   Anti-dsDNA Ab by Farr(RDL) <8.0 <8.0 IU/mL   Anti-Sm Ab (RDL) <20 <20 Units   Anti-U1 RNP Ab (RDL) <20 <20 Units   Anti-Ro (SS-A) Ab (RDL) <20 <20 Units   Anti-La (SS-B) Ab (RDL) <20 <20 Units   Anti-Scl-70 Ab (RDL) <20 <20 Units   Anti-Cardiolipin Ab, IgG (RDL) <15 <15 GPL U/mL   Anti-Cardiolipin Ab, IgA (RDL) <12 <12 APL U/mL   Anti-Cardiolipin Ab, IgM (RDL) <13 <13 MPL U/mL   C3 Complement (RDL) 222 (H) 90 - 180 mg/dL   C4 Complement (RDL) 34 10 -  40 mg/dL    Comment:               **Please note reference interval change**   Anti-TPO Ab (RDL) 11.7 (H) <9.0 IU/mL    Comment:    Interpretation for Anti-Sm, Anti-U1 RNP, Anti-Ro,     Anti-La:         Negative:                                <20         Weak Positive:                       20 - 39         Moderate Positive:                   40 - 80         Strong Positive:                         >80         Strong Positive:                         >59    Interpretation for Anti-Cardiolipin Ab:     Negative:               GPL <15, APL <12, MPL <13     Indeterminate:    GPL 15-20, APL 12-20, MPL 13-20     Low Positive:           GPL, APL, MPL    >20 - 40     Med Positive:           GPL, APL, MPL    >40 - 80     High Positive:          GPL, APL, MPL         >80 SLE classification criteria are based on Med to High titer (>40) Anti-Cardiolipin Ab (aCL). aCL may be elevated transiently with certain infections and may increase spuriously in the presence of  rheumatoid factor.   Sickle Cell Analysis     Status: None   Collection Time: 09/01/23 12:35 PM  Result Value Ref Range   Ethnicity Comment     Comment: Not Provided     Specimen Type Comment     Comment: Whole Blood     Indication Comment     Comment: Screening     Result Comment     Comment: NEGATIVE     Interpretation Comment     Comment: Negative Results  Disorders        Result       Interpretation (Gene)  Sickle cell      NEGATIVE     Not a carrier of sickle disease HBB                   cell disease or [id number]                   hemoglobin C disease.                               This test cannot detect                               -  thalassemia or other                               hemoglobinopathies.     Recommendations Comment     Comment: If the above result is positive, genetic counseling is recommended to discuss the potential clinical and/or reproductive implications, as well as recommendations for testing family members and, when applicable, this individual'Artemus Romanoff partner. Genetic counseling services are available. To access [facility] please visit [email or web address] or call [phone]).     Additional Clinical Information Comment     Comment: Beta-hemoglobinopathies, including sickle cell disease and beta-thalassemia, are autosomal recessive disorders of red blood cells with variable severity and age at onset. Signs and symptoms of sickle cell disease may include chronic hemolytic anemia, pain crises, susceptibility to infection, and organ damage. Features of beta-thalassemia may include mild to severe anemia, hepatosplenomegaly, and skeletal changes. Symptoms of other beta-hemoglobinopathies depend on the combination of HBB gene variants and can range from mild anemia to severe, multi-system disease. Pete Brand, [id number] Deacon, [id number]). Treatment is primarily  supportive and focuses on prevention of complications and management of symptoms. In severe cases, bone marrow or cord blood transplantation can be curative. Pete Brand, [id number]). A cell-based gene therapy has been approved by FDA to treat beta-thalassemia. Carriers of beta thalassemia may have mild anemia. Carrier identification should be bas ed on a combination of clinical information, complete blood count, hemoglobin electrophoresis, and DNA testing (Traeger-Synodinos, [id number]).     Comments Comment     Comment: This interpretation is based on the clinical information provided and the current understanding of the molecular genetics of the disorder(Alice Burnside) tested. Information about the disorder(Taimane Stimmel) tested is available at [email or web address].     Methods/Limitations Comment     Comment: Sickle Cell Disease: DNA is isolated and the targeted region of the HBB gene ([id number]) is amplified by the polymerase chain reaction (PCR). HbS (c.20A>T, p.Glu7Val) and HbC (c.19G>A, p.Glu7Lys) pathogenic variants are identified by Sanger sequencing, capillary gel electrophoresis and fluorescence detection. This test cannot detect -thalassemia or other hemoglobinopathies.  Limitations: Technologies used do not detect germline mosaicism and do not rule out the presence of large chromosomal aberrations including rearrangements and gene fusions, or variants in regions or genes not included in this test, or possible inter/intragenic interactions between variants, or repeat expansions. Variant classification and/or interpretation may change over time if more information becomes available. False positive or false negative results may occur for reasons that include: rare genetic variants, sex chromosome abnormalities, pseudogene interference, blood transfusions, bone marro w transplantation, somatic or tissue-specific mosaicism, mislabeled samples, or erroneous representation of  family relationships.  This test was developed and its performance characteristics determined by [employer]. It has not been cleared or approved by the Food and Drug Administration.  [facility] [employer] is a subsidiary of [employer], using the brand [facility]. Inheritest(R) and GeneSeq(R) are registered service marks of [employer].     References Comment     Comment: Bender MA. Sickle Cell Disease. 2003 Sep 15 (Updated 2021 Jan 28). In: Jerri Morale, Ardinger HH, Pagon RA, et al., editors. GeneReviews(R) [Internet]. PMID: 43329518     Director Review/Release Comment     Comment: Component Type      Performed At         Laboratory  Director  Technical           [facility]     Lindy Rhyme, PhD, component,          Laboratories, [place],   [facility] processing          [address],                     [place], [place],                     [address]  Technical           [facility]     Lindy Rhyme, PhD, component,          Laboratories, [place],   [facility] analysis            [address]                     [place], [place], [address]  Professional        [id number], [facility]     Lindy Rhyme, PhD, component           [address] [place],                     [address]                     [place], [place],                     [address]  Electronically released under the direction of Curvin Downing, PhD, [facility]    ANA Titer and Pattern     Status: Abnormal   Collection Time: 09/01/23 12:35 PM  Result Value Ref Range   Speckled Pattern 1:80 (H) <1:40   Note: Comment     Comment: ANA performed by Indirect Fluorescent Antibody (IFA)  CBC     Status: None   Collection Time: 09/07/23  3:09 PM  Result Value Ref Range   WBC 9.8 4.0 - 10.5 K/uL   RBC 4.61 3.87 -  5.11 MIL/uL   Hemoglobin 13.6 12.0 - 15.0 g/dL   HCT 44.0 10.2 - 72.5 %   MCV 87.9 80.0 - 100.0 fL   MCH 29.5 26.0 - 34.0 pg   MCHC 33.6 30.0 - 36.0 g/dL   RDW 36.6 44.0 - 34.7 %   Platelets 379 150 - 400 K/uL   nRBC 0.0 0.0 - 0.2 %    Comment: Performed at [facility], [address] [place], [place] [address]  Comprehensive metabolic panel     Status: Abnormal   Collection Time: 09/07/23  3:09 PM  Result Value Ref Range   Sodium 137 135 - 145 mmol/L   Potassium 4.0 3.5 - 5.1 mmol/L   Chloride 104 98 - 111 mmol/L   CO2 24 22 - 32 mmol/L   Glucose, Bld 178 (H) 70 - 99 mg/dL    Comment: Glucose reference range applies only to samples taken after fasting for at least 8 hours.  BUN 7 6 - 20 mg/dL   Creatinine, Ser 7.84 0.44 - 1.00 mg/dL   Calcium  9.4 8.9 - 10.3 mg/dL   Total Protein 7.2 6.5 - 8.1 g/dL   Albumin 3.6 3.5 - 5.0 g/dL   AST 21 15 - 41 U/L   ALT 17 0 - 44 U/L   Alkaline Phosphatase 96 38 - 126 U/L   Total Bilirubin 0.8 0.0 - 1.2 mg/dL   GFR, Estimated >69 >62 mL/min    Comment: (NOTE) Calculated using the CKD-EPI Creatinine Equation (2021)    Anion gap 9 5 - 15    Comment: Performed at [facility], [address] [place], [place] [address]  CBG monitoring, ED     Status: Abnormal   Collection Time: 09/07/23  4:31 PM  Result Value Ref Range   Glucose-Capillary 178 (H) 70 - 99 mg/dL    Comment: Glucose reference range applies only to samples taken after fasting for at least 8 hours.  POCT Glucose (CBG)     Status: Abnormal   Collection Time: 09/15/23  8:57 AM  Result Value Ref Range   POC Glucose 173 (A) 70 - 99 mg/dl      Assessment & Plan:  As per problem list. Refer to Rheumatology. Problem List Items Addressed This Visit       Endocrine   DM2 (diabetes mellitus, type 2) (HCC) - Primary   Relevant Orders   POCT Glucose (CBG) (Completed)   Other Visit Diagnoses       Autoimmune disorder (HCC)         Bone necrosis (HCC)            Return in about 3 months (around 12/16/2023).   Total time spent: 20 minutes  Arzella Bitters, MD  09/15/2023   This document may have been prepared by [facility] Voice Recognition software and as such may include unintentional dictation errors.

## 2023-09-22 ENCOUNTER — Ambulatory Visit: Payer: MEDICAID | Admitting: Internal Medicine

## 2023-09-27 ENCOUNTER — Other Ambulatory Visit (HOSPITAL_COMMUNITY): Payer: Self-pay

## 2023-09-30 ENCOUNTER — Other Ambulatory Visit: Payer: Self-pay

## 2023-10-06 ENCOUNTER — Other Ambulatory Visit: Payer: Self-pay

## 2023-11-24 ENCOUNTER — Encounter (INDEPENDENT_AMBULATORY_CARE_PROVIDER_SITE_OTHER): Payer: Self-pay | Admitting: Nurse Practitioner

## 2023-11-24 ENCOUNTER — Encounter (INDEPENDENT_AMBULATORY_CARE_PROVIDER_SITE_OTHER): Payer: MEDICAID

## 2023-12-13 ENCOUNTER — Telehealth: Payer: Self-pay

## 2023-12-13 ENCOUNTER — Other Ambulatory Visit: Payer: Self-pay | Admitting: Internal Medicine

## 2023-12-13 MED ORDER — CETIRIZINE HCL 10 MG PO TABS: 10.0000 mg | ORAL_TABLET | Freq: Every day | ORAL | 2 refills | Status: DC

## 2023-12-13 MED ORDER — FLUTICASONE PROPIONATE 50 MCG/ACT NA SUSP
1.0000 | Freq: Every day | NASAL | 2 refills | Status: DC
Start: 2023-12-13 — End: 2024-03-17

## 2023-12-13 NOTE — Telephone Encounter (Signed)
 Pt called stating she has had severe issues with allergies, sees ENT next month but was asking if there's an allergy shot or allergy pill you can send in for her for the meantime? Please advise

## 2023-12-16 ENCOUNTER — Other Ambulatory Visit (INDEPENDENT_AMBULATORY_CARE_PROVIDER_SITE_OTHER): Payer: Self-pay | Admitting: Nurse Practitioner

## 2023-12-16 DIAGNOSIS — M79605 Pain in left leg: Secondary | ICD-10-CM

## 2023-12-16 DIAGNOSIS — M879 Osteonecrosis, unspecified: Secondary | ICD-10-CM

## 2023-12-21 ENCOUNTER — Telehealth: Payer: Self-pay

## 2023-12-21 NOTE — Telephone Encounter (Signed)
 Pt states she was prescribed zyrtec  at her last appointment and is requesting a higher dose. Please advise.

## 2023-12-22 ENCOUNTER — Other Ambulatory Visit: Payer: Self-pay

## 2023-12-22 MED ORDER — ALBUTEROL SULFATE HFA 108 (90 BASE) MCG/ACT IN AERS: 2.0000 | INHALATION_SPRAY | Freq: Four times a day (QID) | RESPIRATORY_TRACT | 2 refills | Status: DC | PRN

## 2023-12-22 NOTE — Telephone Encounter (Signed)
Pt informed

## 2023-12-22 NOTE — Telephone Encounter (Signed)
 Pt called requesting refill please advise?

## 2023-12-24 ENCOUNTER — Ambulatory Visit: Payer: MEDICAID | Admitting: Internal Medicine

## 2023-12-28 ENCOUNTER — Encounter (INDEPENDENT_AMBULATORY_CARE_PROVIDER_SITE_OTHER): Payer: Self-pay | Admitting: Nurse Practitioner

## 2023-12-28 ENCOUNTER — Encounter (INDEPENDENT_AMBULATORY_CARE_PROVIDER_SITE_OTHER): Payer: MEDICAID

## 2023-12-31 ENCOUNTER — Other Ambulatory Visit: Payer: Self-pay

## 2023-12-31 MED ORDER — ROPINIROLE HCL 2 MG PO TABS: 2.0000 mg | ORAL_TABLET | Freq: Two times a day (BID) | ORAL | 0 refills | Status: DC

## 2024-01-16 ENCOUNTER — Emergency Department
Admission: EM | Admit: 2024-01-16 | Discharge: 2024-01-16 | Disposition: A | Discharge status: Home / Self Care | Payer: MEDICAID | Attending: Emergency Medicine | Admitting: Emergency Medicine

## 2024-01-16 ENCOUNTER — Other Ambulatory Visit: Payer: Self-pay

## 2024-01-16 DIAGNOSIS — X58XXXA Exposure to other specified factors, initial encounter: Secondary | ICD-10-CM | POA: Diagnosis not present

## 2024-01-16 DIAGNOSIS — S0591XA Unspecified injury of right eye and orbit, initial encounter: Secondary | ICD-10-CM | POA: Diagnosis present

## 2024-01-16 DIAGNOSIS — J3489 Other specified disorders of nose and nasal sinuses: Secondary | ICD-10-CM | POA: Diagnosis not present

## 2024-01-16 DIAGNOSIS — E119 Type 2 diabetes mellitus without complications: Secondary | ICD-10-CM | POA: Diagnosis not present

## 2024-01-16 DIAGNOSIS — J029 Acute pharyngitis, unspecified: Secondary | ICD-10-CM | POA: Insufficient documentation

## 2024-01-16 DIAGNOSIS — R09A2 Foreign body sensation, throat: Secondary | ICD-10-CM

## 2024-01-16 DIAGNOSIS — R059 Cough, unspecified: Secondary | ICD-10-CM | POA: Diagnosis not present

## 2024-01-16 DIAGNOSIS — S0501XA Injury of conjunctiva and corneal abrasion without foreign body, right eye, initial encounter: Secondary | ICD-10-CM | POA: Diagnosis not present

## 2024-01-16 DIAGNOSIS — J449 Chronic obstructive pulmonary disease, unspecified: Secondary | ICD-10-CM | POA: Diagnosis not present

## 2024-01-16 MED ORDER — POLYMYXIN B-TRIMETHOPRIM 10000-0.1 UNIT/ML-% OP SOLN
1.0000 [drp] | OPHTHALMIC | 0 refills | Status: DC
Start: 2024-01-16 — End: 2024-01-16

## 2024-01-16 MED ORDER — POLYMYXIN B-TRIMETHOPRIM 10000-0.1 UNIT/ML-% OP SOLN: 1.0000 [drp] | OPHTHALMIC | 0 refills | Status: AC

## 2024-01-16 NOTE — ED Provider Notes (Signed)
 [facility] Provider Note   Event Date/Time   First MD Initiated Contact with Patient 01/16/24 1112     (approximate) History  No chief complaint on file.  HPI OVEDA Shawna [name] is a 58 y.o. female with past medical history of opioid use disorder, anxiety, COPD, type 2 diabetes, and homelessness who presents complaining of sore throat and the sensation of a foreign body as if there is a hair going down the back of her throat that she cannot swallow.  Patient also endorses getting a cat hair into the right eye causing pain.  Patient states that she was able to remove the cat hair however still has pain to the medial aspect of the right eye.  Patient also endorses increased seasonal allergic reaction including rhinorrhea and cough. ROS: Patient currently denies any vision changes, tinnitus, difficulty speaking, facial droop, chest pain, shortness of breath, abdominal pain, nausea/vomiting/diarrhea, dysuria, or weakness/numbness/paresthesias in any extremity   Physical Exam  Triage Vital Signs: ED Triage Vitals  Encounter Vitals Group     BP 01/16/24 1052 (!) 129/92     Girls Systolic BP Percentile --      Girls Diastolic BP Percentile --      Boys Systolic BP Percentile --      Boys Diastolic BP Percentile --      Pulse Rate 01/16/24 1052 (!) 106     Resp 01/16/24 1052 18     Temp 01/16/24 1052 98.1 F (36.7 C)     Temp Source 01/16/24 1052 Oral     SpO2 01/16/24 1059 100 %     Weight --      Height --      Head Circumference --      Peak Flow --      Pain Score 01/16/24 1059 0     Pain Loc --      Pain Education --      Exclude from Growth Chart --    Most recent vital signs: Vitals:   01/16/24 1052 01/16/24 1059  BP: (!) 129/92   Pulse: (!) 106   Resp: 18   Temp: 98.1 F (36.7 C)   SpO2:  100%   General: Awake, oriented x4. CV:  Good peripheral perfusion. Resp:  Normal effort. Abd:  No distention. Other:  Middle-aged well-developed,  well-nourished Caucasian female resting comfortably in no acute distress.  No obvious foreign body around the uvula or in the posterior oropharynx however there is significant erythema.  There is also an obvious corneal abrasion to the 3 o'clock position just medial to the iris on the right eye ED Results / Procedures / Treatments  Labs (all labs ordered are listed, but only abnormal results are displayed) Labs Reviewed - No data to display PROCEDURES: Critical Care performed: No Procedures MEDICATIONS ORDERED IN ED: Medications - No data to display IMPRESSION / MDM / ASSESSMENT AND PLAN / ED COURSE  I reviewed the triage vital signs and the nursing notes.                             The patient is on the cardiac monitor to evaluate for evidence of arrhythmia and/or significant heart rate changes. Patient's presentation is most consistent with acute presentation with potential threat to life or bodily function. Patient is a 58 year old female with the above-stated past medical history who presents complaining of of foreign body to the throat as well as  a obvious corneal abrasion to the right eye.  Upon examination, patient has no evidence of foreign body to the uvula or throat however does have evidence of of likely pharyngitis.  Patient counseled on the importance of follow-up with her primary care physician if this sensation does not improve over the next 3-5 days.  Patient also showing signs of corneal abrasion will place on Polytrim  eyedrops as well as instructions to follow-up at [facility] eye care for any worsening pain, vision changes, or bleeding in this eye.  Patient agrees with plan for discharge at this time and follow-up.  Strict return precautions given and all questions answered prior to discharge  Dispo: Discharge home with PCP and Optho follow-up   FINAL CLINICAL IMPRESSION(S) / ED DIAGNOSES   Final diagnoses:  Foreign body sensation in throat  Pharyngitis, unspecified etiology   Abrasion of right cornea, initial encounter   Rx / DC Orders   ED Discharge Orders          Ordered    trimethoprim -polymyxin b  (POLYTRIM ) ophthalmic solution  Every 4 hours        01/16/24 1138           Note:  This document was prepared using Dragon voice recognition software and may include unintentional dictation errors.   Hailynn Slovacek K, MD 01/16/24 1154

## 2024-01-16 NOTE — ED Triage Notes (Signed)
 Pt to ED via POV from home. Pt reports feels as if a hair is wrapped around her uvula. Pt denies pain.

## 2024-01-17 ENCOUNTER — Other Ambulatory Visit: Payer: Self-pay | Admitting: Internal Medicine

## 2024-01-29 DIAGNOSIS — I70229 Atherosclerosis of native arteries of extremities with rest pain, unspecified extremity: Secondary | ICD-10-CM | POA: Insufficient documentation

## 2024-01-29 DIAGNOSIS — J449 Chronic obstructive pulmonary disease, unspecified: Secondary | ICD-10-CM | POA: Insufficient documentation

## 2024-01-29 NOTE — Progress Notes (Deleted)
 MRN : [id number]  Shawna [name] is a 58 y.o. (06/26/1965) female who presents with chief complaint of check circulation.  History of Present Illness:    The patient is seen for evaluation of painful lower extremities and diminished pulses. Patient notes the pain is always associated with activity and is very consistent day today. Typically, the pain occurs at less than one block, progress is as activity continues to the point that the patient must stop walking. Resting including standing still for several minutes allows the patient to walk a similar distance before being forced to stop again. Uneven terrain and inclines shorten the distance. The pain has been progressive over the past several years. The patient denies any abrupt changes in claudication symptoms.  The patient states the inability to walk is causing problems with daily activities.  The patient denies rest pain or dangling of an extremity off the side of the bed during the night for relief. No open wounds or sores at this time. No prior interventions or surgeries.  No history of back problems or DJD of the lumbar sacral spine.   The patient's blood pressure has been stable and relatively well controlled. The patient denies amaurosis fugax or recent TIA symptoms. There are no recent neurological changes noted. The patient denies history of DVT, PE or superficial thrombophlebitis. The patient denies recent episodes of angina or shortness of breath.   No outpatient medications have been marked as taking for the 01/31/24 encounter (Appointment) with Jama, Cordella MATSU, MD.    Past Medical History:  Diagnosis Date   Anxiety    Asthma    Bulging lumbar disc    Bulging lumbar disc    COPD (chronic obstructive pulmonary disease) (HCC)    GERD (gastroesophageal reflux disease)    Homelessness 11/09/2022   Insomnia    Kidney stone    Pneumonia 03/2015    Sciatic leg pain    Right side   Sciatica of right side    Weakness    Bil knees, and ankles    Past Surgical History:  Procedure Laterality Date   ABDOMINAL HYSTERECTOMY     Partial   ESOPHAGOGASTRODUODENOSCOPY (EGD) WITH PROPOFOL  N/A 11/27/2015   Procedure: ESOPHAGOGASTRODUODENOSCOPY (EGD) WITH PROPOFOL ;  Surgeon: Gladis RAYMOND Mariner, MD;  Location: [facility] ENDOSCOPY;  Service: Endoscopy;  Laterality: N/A;   SHOULDER SURGERY Right    TONSILLECTOMY     TUBAL LIGATION      Social History Social History   Tobacco Use   Smoking status: Every Day    Current packs/day: 1.00    Average packs/day: 1 pack/day for 40.0 years (40.0 ttl pk-yrs)    Types: Cigarettes   Smokeless tobacco: Current  Vaping Use   Vaping status: Some Days   Substances: Nicotine , Flavoring  Substance Use Topics   Alcohol use: Yes    Comment: occ   Drug use: Yes    Types: Marijuana    Comment: once in while    Family History Family History  Problem Relation Age of Onset   Fibromyalgia Mother     Allergies  Allergen  Reactions   Haloperidol      Made pt feel really weird.     Hydrocodone -Acetaminophen  Nausea And Vomiting and Nausea Only   Morphine  Nausea And Vomiting    Patient states this medication makes her have sever flu symptoms.   Morphine  And Codeine Nausea And Vomiting    Patient states this medication makes her have sever flu symptoms.   Pravastatin     CHEST PAIN   Pravastatin Sodium     Unknown    Promethazine  Hcl     Unknown    Fentanyl  Nausea And Vomiting and Other (See Comments)    Patient states this makes her have severe flu symptoms.   Propofol  Nausea And Vomiting     REVIEW OF SYSTEMS (Negative unless checked)  Constitutional: [] Weight loss  [] Fever  [] Chills Cardiac: [] Chest pain   [] Chest pressure   [] Palpitations   [] Shortness of breath when laying flat   [] Shortness of breath with exertion. Vascular:  [x] Pain in legs with walking   [] Pain in legs at rest  [] History of  DVT   [] Phlebitis   [] Swelling in legs   [] Varicose veins   [] Non-healing ulcers Pulmonary:   [] Uses home oxygen   [] Productive cough   [] Hemoptysis   [] Wheeze  [] COPD   [] Asthma Neurologic:  [] Dizziness   [] Seizures   [] History of stroke   [] History of TIA  [] Aphasia   [] Vissual changes   [] Weakness or numbness in arm   [] Weakness or numbness in leg Musculoskeletal:   [] Joint swelling   [] Joint pain   [] Low back pain Hematologic:  [] Easy bruising  [] Easy bleeding   [] Hypercoagulable state   [] Anemic Gastrointestinal:  [] Diarrhea   [] Vomiting  [] Gastroesophageal reflux/heartburn   [] Difficulty swallowing. Genitourinary:  [] Chronic kidney disease   [] Difficult urination  [] Frequent urination   [] Blood in urine Skin:  [] Rashes   [] Ulcers  Psychological:  [] History of anxiety   []  History of major depression.  Physical Examination  There were no vitals filed for this visit. There is no height or weight on file to calculate BMI. Gen: WD/WN, NAD Head: /AT, No temporalis wasting.  Ear/Nose/Throat: Hearing grossly intact, nares w/o erythema or drainage Eyes: PER, EOMI, sclera nonicteric.  Neck: Supple, no masses.  No bruit or JVD.  Pulmonary:  Good air movement, no audible wheezing, no use of accessory muscles.  Cardiac: RRR, normal S1, S2, no Murmurs. Vascular:  mild trophic changes, no open wounds Vessel Right Left  Radial Palpable Palpable  PT Not Palpable Not Palpable  DP Not Palpable Not Palpable  Gastrointestinal: soft, non-distended. No guarding/no peritoneal signs.  Musculoskeletal: M/S 5/5 throughout.  No visible deformity.  Neurologic: CN 2-12 intact. Pain and light touch intact in extremities.  Symmetrical.  Speech is fluent. Motor exam as listed above. Psychiatric: Judgment intact, Mood & affect appropriate for pt's clinical situation. Dermatologic: No rashes or ulcers noted.  No changes consistent with cellulitis.   CBC Lab Results  Component Value Date   WBC 9.8  09/07/2023   HGB 13.6 09/07/2023   HCT 40.5 09/07/2023   MCV 87.9 09/07/2023   PLT 379 09/07/2023    BMET    Component Value Date/Time   NA 137 09/07/2023 1509   NA 150 (H) 03/08/2014 0337   K 4.0 09/07/2023 1509   K 3.3 (L) 03/08/2014 1111   CL 104 09/07/2023 1509   CL 117 (H) 03/08/2014 0337   CO2 24 09/07/2023 1509   CO2 27 03/08/2014 0337   GLUCOSE  178 (H) 09/07/2023 1509   GLUCOSE 103 (H) 03/08/2014 0337   BUN 7 09/07/2023 1509   BUN 10 03/08/2014 0337   CREATININE 0.67 09/07/2023 1509   CREATININE 1.18 03/08/2014 0337   CALCIUM  9.4 09/07/2023 1509   CALCIUM  7.5 (L) 03/08/2014 0337   GFRNONAA >60 09/07/2023 1509   GFRNONAA 52 (L) 03/08/2014 0337   GFRNONAA >60 02/09/2012 1542   GFRAA >60 09/20/2019 2256   GFRAA >60 03/08/2014 0337   GFRAA >60 02/09/2012 1542   CrCl cannot be calculated (Patient's most recent lab result is older than the maximum 21 days allowed.).  COAG Lab Results  Component Value Date   INR 0.9 05/17/2023   INR 1.0 01/03/2021   INR 1.07 08/30/2017    Radiology No results found.   Assessment/Plan There are no diagnoses linked to this encounter.   Cordella Shawl, MD  01/29/2024 12:21 PM

## 2024-01-31 ENCOUNTER — Telehealth: Payer: Self-pay

## 2024-01-31 ENCOUNTER — Encounter (INDEPENDENT_AMBULATORY_CARE_PROVIDER_SITE_OTHER): Payer: MEDICAID

## 2024-01-31 ENCOUNTER — Encounter (INDEPENDENT_AMBULATORY_CARE_PROVIDER_SITE_OTHER): Payer: MEDICAID | Admitting: Vascular Surgery

## 2024-01-31 DIAGNOSIS — E785 Hyperlipidemia, unspecified: Secondary | ICD-10-CM

## 2024-01-31 DIAGNOSIS — J449 Chronic obstructive pulmonary disease, unspecified: Secondary | ICD-10-CM

## 2024-01-31 DIAGNOSIS — I70229 Atherosclerosis of native arteries of extremities with rest pain, unspecified extremity: Secondary | ICD-10-CM

## 2024-01-31 DIAGNOSIS — E1165 Type 2 diabetes mellitus with hyperglycemia: Secondary | ICD-10-CM

## 2024-01-31 NOTE — Telephone Encounter (Signed)
 Patient Shawna [name] stating that the allergy medication you have her on is no longer working and she asked for something different to be sent in

## 2024-02-01 NOTE — Telephone Encounter (Signed)
 Patient called, unable to leave voicemail due to mailbox being full 10/7-2:20pm-BB

## 2024-02-18 ENCOUNTER — Other Ambulatory Visit: Payer: Self-pay | Admitting: Internal Medicine

## 2024-02-18 ENCOUNTER — Ambulatory Visit: Payer: MEDICAID | Admitting: Cardiology

## 2024-02-18 ENCOUNTER — Telehealth: Payer: Self-pay

## 2024-02-18 NOTE — Telephone Encounter (Signed)
 Patient Left multiple messages about her refills  stating that she was running a fever and couldn't get on the bus but she needs her zyrtec , nasal spray and ropinirole  called in

## 2024-02-23 ENCOUNTER — Other Ambulatory Visit: Payer: Self-pay | Admitting: Internal Medicine

## 2024-03-09 ENCOUNTER — Encounter: Payer: Self-pay | Admitting: Ophthalmology

## 2024-03-09 NOTE — Anesthesia Preprocedure Evaluation (Addendum)
 Anesthesia Evaluation  Patient identified by MRN, date of birth, ID band Patient awake    Reviewed: Allergy & Precautions, H&P , NPO status , Patient's Chart, lab work & pertinent test results  Airway Mallampati: III  TM Distance: <3 FB     Dental  (+) Chipped, Loose, Missing, Poor Dentition   Pulmonary asthma , pneumonia, COPD, Current Smoker and Patient abstained from smoking.          Cardiovascular + Peripheral Vascular Disease  negative cardio ROS      Neuro/Psych  PSYCHIATRIC DISORDERS Anxiety Depression     Neuromuscular disease negative neurological ROS  negative psych ROS   GI/Hepatic negative GI ROS, Neg liver ROS,GERD  ,,  Endo/Other  negative endocrine ROSdiabetes    Renal/GU Renal diseasenegative Renal ROS  negative genitourinary   Musculoskeletal negative musculoskeletal ROS (+)    Abdominal   Peds negative pediatric ROS (+)  Hematology negative hematology ROS (+)   Anesthesia Other Findings Sciatica of right side Bulging lumbar disc Insomnia Sciatic leg pain Weakness Bulging lumbar disc Asthma Kidney stone Pneumonia GERD (gastroesophageal reflux disease) Anxiety COPD (chronic obstructive pulmonary disease) (HCC) Homelessness, now living with her father, who has dementia, and is often difficult to deal with, her daughter has POA for the father.      Reproductive/Obstetrics negative OB ROS                              Anesthesia Physical Anesthesia Plan  ASA: 3  Anesthesia Plan: MAC   Post-op Pain Management:    Induction: Intravenous  PONV Risk Score and Plan:   Airway Management Planned: Natural Airway and Nasal Cannula  Additional Equipment:   Intra-op Plan:   Post-operative Plan:   Informed Consent: I have reviewed the patients History and Physical, chart, labs and discussed the procedure including the risks, benefits and alternatives for the proposed  anesthesia with the patient or authorized representative who has indicated his/her understanding and acceptance.     Dental Advisory Given  Plan Discussed with: Anesthesiologist, CRNA and Surgeon  Anesthesia Plan Comments: (Patient consented for risks of anesthesia including but not limited to:  - adverse reactions to medications - damage to eyes, teeth, lips or other oral mucosa - nerve damage due to positioning  - sore throat or hoarseness - Damage to heart, brain, nerves, lungs, other parts of body or loss of life  Patient voiced understanding and assent.)        Anesthesia Quick Evaluation

## 2024-03-14 NOTE — Discharge Instructions (Signed)

## 2024-03-15 ENCOUNTER — Other Ambulatory Visit: Payer: Self-pay

## 2024-03-15 ENCOUNTER — Ambulatory Visit: Payer: MEDICAID | Admitting: Anesthesiology

## 2024-03-15 ENCOUNTER — Encounter: Admission: RE | Disposition: A | Discharge status: Home / Self Care | Payer: Self-pay | Source: Home / Self Care

## 2024-03-15 ENCOUNTER — Ambulatory Visit
Admission: RE | Admit: 2024-03-15 | Discharge: 2024-03-15 | Disposition: A | Discharge status: Home / Self Care | Payer: MEDICAID

## 2024-03-15 DIAGNOSIS — E1151 Type 2 diabetes mellitus with diabetic peripheral angiopathy without gangrene: Secondary | ICD-10-CM | POA: Insufficient documentation

## 2024-03-15 DIAGNOSIS — F17210 Nicotine dependence, cigarettes, uncomplicated: Secondary | ICD-10-CM | POA: Diagnosis not present

## 2024-03-15 DIAGNOSIS — G709 Myoneural disorder, unspecified: Secondary | ICD-10-CM | POA: Diagnosis not present

## 2024-03-15 DIAGNOSIS — F419 Anxiety disorder, unspecified: Secondary | ICD-10-CM | POA: Diagnosis not present

## 2024-03-15 DIAGNOSIS — F32A Depression, unspecified: Secondary | ICD-10-CM | POA: Diagnosis not present

## 2024-03-15 DIAGNOSIS — J4489 Other specified chronic obstructive pulmonary disease: Secondary | ICD-10-CM | POA: Insufficient documentation

## 2024-03-15 DIAGNOSIS — K219 Gastro-esophageal reflux disease without esophagitis: Secondary | ICD-10-CM | POA: Diagnosis not present

## 2024-03-15 DIAGNOSIS — H2511 Age-related nuclear cataract, right eye: Secondary | ICD-10-CM | POA: Diagnosis present

## 2024-03-15 DIAGNOSIS — Z794 Long term (current) use of insulin: Secondary | ICD-10-CM | POA: Diagnosis not present

## 2024-03-15 HISTORY — PX: CATARACT EXTRACTION W/PHACO: SHX586

## 2024-03-15 HISTORY — DX: Personal history of peptic ulcer disease: Z87.11

## 2024-03-15 HISTORY — DX: Hypermobility syndrome: M35.7

## 2024-03-15 HISTORY — DX: Pain in unspecified joint: M25.50

## 2024-03-15 HISTORY — DX: Irritable bowel syndrome, unspecified: K58.9

## 2024-03-15 LAB — GLUCOSE, CAPILLARY: Glucose-Capillary: 129 mg/dL — ABNORMAL HIGH (ref 70–99)

## 2024-03-15 SURGERY — PHACOEMULSIFICATION, CATARACT, WITH IOL INSERTION
Anesthesia: Monitor Anesthesia Care | Laterality: Right

## 2024-03-15 MED ORDER — BALANCED SALT IO SOLN
INTRAOCULAR | Status: DC | PRN
  Administered 2024-03-15: 1 mL via INTRAOCULAR

## 2024-03-15 MED ORDER — MIDAZOLAM HCL 2 MG/2ML IJ SOLN
INTRAMUSCULAR | Status: AC
  Filled 2024-03-15: qty 2

## 2024-03-15 MED ORDER — FENTANYL CITRATE (PF) 100 MCG/2ML IJ SOLN
INTRAMUSCULAR | Status: AC
  Filled 2024-03-15: qty 2

## 2024-03-15 MED ORDER — CYCLOPENTOLATE HCL 2 % OP SOLN
1.0000 [drp] | OPHTHALMIC | Status: AC
  Administered 2024-03-15 (×3): 1 [drp] via OPHTHALMIC

## 2024-03-15 MED ORDER — BRIMONIDINE TARTRATE-TIMOLOL 0.2-0.5 % OP SOLN
OPHTHALMIC | Status: DC | PRN
  Administered 2024-03-15: 1 [drp] via OPHTHALMIC

## 2024-03-15 MED ORDER — SIGHTPATH DOSE#1 NA CHONDROIT SULF-NA HYALURON 20-15 MG/0.5ML IO SOSY
INTRAOCULAR | Status: DC | PRN
  Administered 2024-03-15: .5 mL via INTRAOCULAR

## 2024-03-15 MED ORDER — TETRACAINE HCL 0.5 % OP SOLN
1.0000 [drp] | OPHTHALMIC | Status: AC
  Administered 2024-03-15 (×3): 1 [drp] via OPHTHALMIC

## 2024-03-15 MED ORDER — SIGHTPATH DOSE#1 BSS IO SOLN
INTRAOCULAR | Status: DC | PRN
  Administered 2024-03-15: 176 mL via OPHTHALMIC

## 2024-03-15 MED ORDER — FENTANYL CITRATE (PF) 100 MCG/2ML IJ SOLN
INTRAMUSCULAR | Status: DC | PRN
  Administered 2024-03-15 (×3): 50 ug via INTRAVENOUS

## 2024-03-15 MED ORDER — MIDAZOLAM HCL (PF) 2 MG/2ML IJ SOLN
INTRAMUSCULAR | Status: DC | PRN
  Administered 2024-03-15: 2 mg via INTRAVENOUS
  Administered 2024-03-15 (×2): 1 mg via INTRAVENOUS

## 2024-03-15 MED ORDER — TETRACAINE HCL 0.5 % OP SOLN
OPHTHALMIC | Status: AC
  Filled 2024-03-15: qty 4

## 2024-03-15 MED ORDER — CYCLOPENTOLATE HCL 2 % OP SOLN
OPHTHALMIC | Status: AC
  Filled 2024-03-15: qty 2

## 2024-03-15 MED ORDER — PHENYLEPHRINE HCL 10 % OP SOLN
1.0000 [drp] | OPHTHALMIC | Status: AC
  Administered 2024-03-15 (×3): 1 [drp] via OPHTHALMIC

## 2024-03-15 MED ORDER — TRYPAN BLUE 0.06 % IO SOSY
PREFILLED_SYRINGE | INTRAOCULAR | Status: DC | PRN
  Administered 2024-03-15: .5 mL via INTRAOCULAR

## 2024-03-15 MED ORDER — TETRACAINE 0.5 % OP SOLN OPTIME - NO CHARGE
OPHTHALMIC | Status: DC | PRN
  Administered 2024-03-15: 1 [drp] via OPHTHALMIC

## 2024-03-15 MED ORDER — PHENYLEPHRINE HCL 10 % OP SOLN
OPHTHALMIC | Status: AC
Start: 2024-03-15 — End: 2024-03-15
  Filled 2024-03-15: qty 5

## 2024-03-15 MED ORDER — SIGHTPATH DOSE#1 NA HYALUR & NA CHOND-NA HYALUR IO KIT
PACK | INTRAOCULAR | Status: DC | PRN
  Administered 2024-03-15: 1 via OPHTHALMIC

## 2024-03-15 MED ORDER — SIGHTPATH DOSE#1 BSS IO SOLN
INTRAOCULAR | Status: DC | PRN
  Administered 2024-03-15 (×2): 15 mL via INTRAOCULAR

## 2024-03-15 MED ORDER — MOXIFLOXACIN HCL 0.5 % OP SOLN
OPHTHALMIC | Status: DC | PRN
  Administered 2024-03-15: .2 mL via OPHTHALMIC

## 2024-03-15 MED ORDER — LACTATED RINGERS IV SOLN: INTRAVENOUS | Status: DC

## 2024-03-15 SURGICAL SUPPLY — 11 items
CANNULA HYDRODISSEC NUC 25X7/8 (MISCELLANEOUS) ×1 IMPLANT
DRSG TEGADERM 2-3/8X2-3/4 SM (GAUZE/BANDAGES/DRESSINGS) ×1 IMPLANT
FEE CATARACT SUITE SIGHTPATH (MISCELLANEOUS) ×1 IMPLANT
GLOVE BIOGEL PI IND STRL 8 (GLOVE) ×1 IMPLANT
GLOVE SURG LX STRL 7.5 STRW (GLOVE) ×1 IMPLANT
GLOVE SURG SYN 6.5 PF PI BL (GLOVE) ×1 IMPLANT
LENS IOL CLRN CLEAR 22.0 (Intraocular Lens) IMPLANT
NDL FILTER BLUNT 18X1 1/2 (NEEDLE) ×1 IMPLANT
NEEDLE FILTER BLUNT 18X1 1/2 (NEEDLE) ×1 IMPLANT
SUTURE EHLN 10-0 CS-B-6CS-B-6 (SUTURE) IMPLANT
SYR 3ML LL SCALE MARK (SYRINGE) ×1 IMPLANT

## 2024-03-15 NOTE — Anesthesia Postprocedure Evaluation (Signed)
 Anesthesia Post Note  Patient: Shawna [name]  Procedure(s) Performed: PHACOEMULSIFICATION, CATARACT, WITH IOL INSERTION 13.59, 01:27.4 (Right)  Patient location during evaluation: PACU Anesthesia Type: MAC Level of consciousness: awake and alert Pain management: pain level controlled Vital Signs Assessment: post-procedure vital signs reviewed and stable Respiratory status: spontaneous breathing, nonlabored ventilation, respiratory function stable and patient connected to nasal cannula oxygen Cardiovascular status: stable and blood pressure returned to baseline Postop Assessment: no apparent nausea or vomiting Anesthetic complications: no   No notable events documented.   Last Vitals:  Vitals:   03/15/24 1230 03/15/24 1231  BP:  123/82  Pulse: 73 71  Resp: 18 11  Temp:  36.6 C  SpO2: 96% 95%    Last Pain:  Vitals:   03/15/24 1231  TempSrc:   PainSc: 0-No pain                 Kristapher [place] C Zafir Schauer

## 2024-03-15 NOTE — H&P (Signed)
 [facility]   Primary Care Physician:  Albina GORMAN Dine, MD Ophthalmologist: Dr. Curtistine Fava  Pre-Procedure History & Physical: HPI:  Shawna [name] is a 58 y.o. female here for cataract surgery.   Past Medical History:  Diagnosis Date   Anxiety    Asthma    Benign hypermobility syndrome    Bulging lumbar disc    Bulging lumbar disc    COPD (chronic obstructive pulmonary disease) (HCC)    GERD (gastroesophageal reflux disease)    History of peptic ulcer disease    Homelessness 11/09/2022   IBS (irritable bowel syndrome)    Insomnia    Kidney stone    Pneumonia 03/2015   Polyarthralgia    Sciatic leg pain    Right side   Sciatica of right side    Weakness    Bil knees, and ankles    Past Surgical History:  Procedure Laterality Date   ABDOMINAL HYSTERECTOMY     Partial   ESOPHAGOGASTRODUODENOSCOPY (EGD) WITH PROPOFOL  N/A 11/27/2015   Procedure: ESOPHAGOGASTRODUODENOSCOPY (EGD) WITH PROPOFOL ;  Surgeon: Gladis RAYMOND Mariner, MD;  Location: [facility] ENDOSCOPY;  Service: Endoscopy;  Laterality: N/A;   SHOULDER SURGERY Right    TONSILLECTOMY     TUBAL LIGATION      Prior to Admission medications   Medication Sig Start Date End Date Taking? Authorizing Provider  albuterol  (VENTOLIN  HFA) 108 (90 Base) MCG/ACT inhaler Inhale 2 puffs into the lungs every 6 (six) hours as needed for wheezing or shortness of breath. 12/22/23  Yes Tejan-Sie, GORMAN Dine, MD  Blood Glucose Monitoring Suppl (ACCU-CHEK GUIDE) w/Device KIT Use in the morning, at noon, and at bedtime. 05/19/23  Yes Akula, Vijaya, MD  cetirizine  (ZYRTEC ) 10 MG tablet TAKE 1 TABLET BY MOUTH ONCE DAILY 02/18/24  Yes Albina GORMAN Dine, MD  clonazePAM  (KLONOPIN ) 1 MG tablet Take 1 mg by mouth 2 (two) times daily as needed for anxiety. 10/15/22  Yes [provider]  dexlansoprazole  (DEXILANT ) 60 MG capsule Take 1 capsule (60 mg total) by mouth daily. 05/19/23  Yes Akula, Vijaya, MD  dicyclomine  (BENTYL ) 20 MG tablet Take  1 tablet (20 mg total) by mouth 2 (two) times daily. 05/19/23  Yes Akula, Vijaya, MD  hydrOXYzine  (ATARAX ) 50 MG tablet Take 1-2 tablets (50-100 mg total) by mouth at bedtime as needed for anxiety (sleep). 05/19/23  Yes Akula, Vijaya, MD  insulin  aspart (NOVOLOG ) 100 UNIT/ML FlexPen Use as directed per sliding scale. CBG 70 - 120: 0 units. CBG 121 - 150: 1 unit. CBG 151 - 200: 2 units. CBG 201 - 250: 3 units. CBG 251 - 300: 5 units. CBG 301 - 350: 7 units. CBG 351 - 400: 9 units 05/19/23  Yes Akula, Vijaya, MD  Insulin  Pen Needle 31G X 6 MM MISC Use to inject insulin  as directed 3 (three) times daily before meals. 05/19/23  Yes Akula, Vijaya, MD  lidocaine  (XYLOCAINE ) 5 % ointment Apply 1 Application topically daily as needed for mild pain (pain score 1-3) or moderate pain (pain score 4-6). 08/17/22  Yes [provider]  MAGNESIUM  PO Take 1 tablet by mouth daily.   Yes [provider]  ondansetron  (ZOFRAN -ODT) 4 MG disintegrating tablet DISSOLVE 1 TABLET ON THE TONGUE EVERY 6 HOURS AS NEEDED FOR NAUSEA AND VOMITING 01/17/24  Yes Tejan-Sie, GORMAN Dine, MD  POTASSIUM PO Take 1 tablet by mouth daily.   Yes [provider]  rOPINIRole  (REQUIP ) 2 MG tablet Take 1 tablet (2 mg total)  by mouth in the morning and at bedtime. 12/31/23  Yes Albina GORMAN Dine, MD  atorvastatin  (LIPITOR) 10 MG tablet Take 1 tablet (10 mg total) by mouth at bedtime. Patient not taking: Reported on 09/01/2023 05/19/23   Akula, Vijaya, MD  fluticasone  (FLONASE ) 50 MCG/ACT nasal spray Place 1 spray into both nostrils daily. 12/13/23 03/12/24  Albina GORMAN Dine, MD  gabapentin  (NEURONTIN ) 300 MG capsule Take 1 capsule (300 mg total) by mouth 3 (three) times daily. Patient not taking: Reported on 03/15/2024 05/19/23   Cherlyn Labella, MD  ipratropium-albuterol  (DUONEB) 0.5-2.5 (3) MG/3ML SOLN Take 3 mLs by nebulization every 6 (six) hours as needed. 05/19/23 02/13/24  Akula, Vijaya, MD  Menthol-Methyl Salicylate (MUSCLE RUB)  10-15 % CREA Apply 1 Application topically as needed. 06/06/23   Cyrena Mylar, MD  trimethoprim -polymyxin b  (POLYTRIM ) ophthalmic solution Place 1 drop into the right eye every 4 (four) hours. Patient not taking: Reported on 03/15/2024 01/16/24   Jossie Artist POUR, MD    Allergies as of 02/16/2024 - Review Complete 01/16/2024  Allergen Reaction Noted   Haloperidol   07/12/2016   Hydrocodone -acetaminophen  Nausea And Vomiting and Nausea Only 03/05/2010   Morphine  Nausea And Vomiting 11/15/2013   Morphine  and codeine Nausea And Vomiting 09/16/2014   Pravastatin  04/14/2016   Pravastatin sodium  03/05/2010   Promethazine  hcl  08/30/2017   Fentanyl  Nausea And Vomiting and Other (See Comments) 12/14/2015   Propofol  Nausea And Vomiting 01/16/2016    Family History  Problem Relation Age of Onset   Fibromyalgia Mother     Social History   Socioeconomic History   Marital status: Single    Spouse name: Not on file   Number of children: Not on file   Years of education: Not on file   Highest education level: Not on file  Occupational History   Not on file  Tobacco Use   Smoking status: Every Day    Current packs/day: 1.00    Average packs/day: 1 pack/day for 40.0 years (40.0 ttl pk-yrs)    Types: Cigarettes   Smokeless tobacco: Current  Vaping Use   Vaping status: Some Days   Substances: Nicotine , Flavoring  Substance and Sexual Activity   Alcohol use: Yes    Comment: occ   Drug use: Yes    Types: Marijuana    Comment: once in while   Sexual activity: Not Currently  Other Topics Concern   Not on file  Social History Narrative   Not on file   Social Drivers of Health   Financial Resource Strain: Not on file  Food Insecurity: No Food Insecurity (05/17/2023)   Hunger Vital Sign    Worried About Running Out of Food in the Last Year: Never true    Ran Out of Food in the Last Year: Never true  Transportation Needs: No Transportation Needs (05/17/2023)   PRAPARE - Therapist, Art (Medical): No    Lack of Transportation (Non-Medical): No  Physical Activity: Not on file  Stress: Not on file  Social Connections: Not on file  Intimate Partner Violence: Not At Risk (05/17/2023)   Humiliation, Afraid, Rape, and Kick questionnaire    Fear of Current or Ex-Partner: No    Emotionally Abused: No    Physically Abused: No    Sexually Abused: No    Review of Systems: See HPI, otherwise negative ROS  Physical Exam: BP 126/82   Pulse 81   Temp 98.2 F (36.8 C) (Temporal)  Resp 17   Ht 5' 6 (1.676 m)   Wt 68.5 kg   SpO2 95%   BMI 24.39 kg/m  General:   Alert, cooperative in NAD Head:  Normocephalic and atraumatic. Respiratory:  Normal work of breathing. Cardiovascular:  RRR  Impression/Plan: SHABRIA EGLEY is here for cataract surgery.  Risks, benefits, limitations, and alternatives regarding cataract surgery have been reviewed with the patient.  Questions have been answered.  All parties agreeable.   Curtistine JINNY Fava, MD  03/15/2024, 11:38 AM

## 2024-03-15 NOTE — Transfer of Care (Signed)
 Immediate Anesthesia Transfer of Care Note  Patient: Shawna [name]  Procedure(s) Performed: PHACOEMULSIFICATION, CATARACT, WITH IOL INSERTION 13.59, 01:27.4 (Right)  Patient Location: PACU  Anesthesia Type: MAC  Level of Consciousness: awake, alert  and patient cooperative  Airway and Oxygen Therapy: Patient Spontanous Breathing and Patient connected to supplemental oxygen  Post-op Assessment: Post-op Vital signs reviewed, Patient's Cardiovascular Status Stable, Respiratory Function Stable, Patent Airway and No signs of Nausea or vomiting  Post-op Vital Signs: Reviewed and stable  Complications: No notable events documented.

## 2024-03-15 NOTE — Op Note (Signed)
 PREOPERATIVE DIAGNOSIS:  Nuclear sclerotic cataract of the right eye.   POSTOPERATIVE DIAGNOSIS:  Right Eye Cataract   OPERATIVE PROCEDURE: Phacoemulsification with IOL Implant Right Eye   SURGEON:  Curtistine Fava, MD   ANESTHESIA:  Anesthesiologist: Ola Donny BROCKS, MD CRNA: Jahoo, Sonia, CRNA  Monitored anesthesia care. Topical tetracaine drops followed by 2% Xylocaine  jelly applied in the preoperative holding area 0.74ml of epi-Shugarcaine was instilled in the eye following the paracentesis.   COMPLICATIONS:  None.   TECHNIQUE:   Phacoemulsification divide and conquer   DESCRIPTION OF PROCEDURE:  The patient was examined and consented in the preoperative holding area where the aforementioned topical anesthesia was applied to the right eye and then brought back to the Operating Room where the right eye was prepped and draped in the usual sterile ophthalmic fashion and a lid speculum was placed. A paracentesis was created with the side port blade and the anterior chamber was filled with epi-Shugarcaine followed Trypan Blue  to stain the anterior capsule due to very poor/no visualization of the red reflex, followed by viscoelastic. A clear corneal incision was performed with the steel keratome. A continuous curvilinear capsulorrhexis was performed with a cystotome followed by the capsulorrhexis forceps. Hydrodissection and hydrodelineation were carried out with BSS on a blunt cannula. The lens was removed in a divide and conquer technique and the remaining cortical material was removed with the irrigation-aspiration handpiece. The capsular bag was inflated with viscoelastic and the CC60WF +22.0 lens was placed in the capsular bag without complication. The remaining viscoelastic was removed from the eye with the irrigation-aspiration handpiece. The wounds were hydrated. The anterior chamber was flushed with BSS and the eye was inflated to physiologic pressure. 0.1ml of Vigamox was placed in the  anterior chamber. The wounds were found to be water tight. The eye was dressed with Combigan and covered with a clear shield to be worn until the first postoperative day appointment. The patient was given protective glasses to wear throughout the day. The patient was also given drops with which to begin a drop regimen today and will follow-up with me in one day. Implant Name Type Inv. Item Serial No. Manufacturer Lot No. LRB No. Used Action  LENS IOL CLRN CLEAR 22.0 - [id number] Intraocular Lens LENS IOL CLRN CLEAR 22.0 [id number] [employer]  Right 1 Implanted   Procedure(s): PHACOEMULSIFICATION, CATARACT, WITH IOL INSERTION 13.59, 01:27.4 (Right)  Electronically signed: Curtistine PARAS [facility] 03/15/2024 12:25 PM

## 2024-03-17 ENCOUNTER — Ambulatory Visit (INDEPENDENT_AMBULATORY_CARE_PROVIDER_SITE_OTHER): Payer: MEDICAID | Admitting: Internal Medicine

## 2024-03-17 VITALS — BP 122/72 | HR 103 | Ht 66.0 in | Wt 150.6 lb

## 2024-03-17 DIAGNOSIS — J301 Allergic rhinitis due to pollen: Secondary | ICD-10-CM | POA: Diagnosis not present

## 2024-03-17 DIAGNOSIS — Z0130 Encounter for examination of blood pressure without abnormal findings: Secondary | ICD-10-CM

## 2024-03-17 DIAGNOSIS — E1165 Type 2 diabetes mellitus with hyperglycemia: Secondary | ICD-10-CM

## 2024-03-17 DIAGNOSIS — G2581 Restless legs syndrome: Secondary | ICD-10-CM | POA: Diagnosis not present

## 2024-03-17 LAB — POCT CBG (FASTING - GLUCOSE)-MANUAL ENTRY: Glucose Fasting, POC: 268 mg/dL — AB (ref 70–99)

## 2024-03-17 MED ORDER — ROPINIROLE HCL 2 MG PO TABS: 2.0000 mg | ORAL_TABLET | Freq: Two times a day (BID) | ORAL | 2 refills | Status: AC

## 2024-03-17 MED ORDER — FLUTICASONE PROPIONATE 50 MCG/ACT NA SUSP: 1.0000 | Freq: Every day | NASAL | 2 refills | Status: AC

## 2024-03-17 MED ORDER — CETIRIZINE HCL 10 MG PO TABS
10.0000 mg | ORAL_TABLET | Freq: Every day | ORAL | 2 refills | Status: AC
Start: 2024-03-17 — End: ?

## 2024-03-17 MED ORDER — MONTELUKAST SODIUM 10 MG PO TABS: 10.0000 mg | ORAL_TABLET | Freq: Every day | ORAL | 2 refills | Status: AC

## 2024-03-17 NOTE — Progress Notes (Signed)
 Established Patient Office Visit  Subjective:  Patient ID: Shawna [name], female    DOB: [birth year]  Age: 58 y.o. MRN: [id number]  Chief Complaint  Patient presents with   Follow-up    Allergy med refills    No new complaints, here for lab review and medication refills. Flare of seasonal allergies but symptoms not fully controlled on current Rx. Recenty underwent cataract extraction with lens replacement.    No other concerns at this time.   Past Medical History:  Diagnosis Date   Anxiety    Asthma    Benign hypermobility syndrome    Bulging lumbar disc    Bulging lumbar disc    COPD (chronic obstructive pulmonary disease) (HCC)    GERD (gastroesophageal reflux disease)    History of peptic ulcer disease    Homelessness 11/09/2022   IBS (irritable bowel syndrome)    Insomnia    Kidney stone    Pneumonia 03/2015   Polyarthralgia    Sciatic leg pain    Right side   Sciatica of right side    Weakness    Bil knees, and ankles    Past Surgical History:  Procedure Laterality Date   ABDOMINAL HYSTERECTOMY     Partial   CATARACT EXTRACTION W/PHACO Right 03/15/2024   Procedure: PHACOEMULSIFICATION, CATARACT, WITH IOL INSERTION 13.59, 01:27.4;  Surgeon: Georgia Curtistine PARAS, MD;  Location: [facility];  Service: Ophthalmology;  Laterality: Right;   ESOPHAGOGASTRODUODENOSCOPY (EGD) WITH PROPOFOL  N/A 11/27/2015   Procedure: ESOPHAGOGASTRODUODENOSCOPY (EGD) WITH PROPOFOL ;  Surgeon: Gladis RAYMOND Mariner, MD;  Location: [facility];  Service: Endoscopy;  Laterality: N/A;   SHOULDER SURGERY Right    TONSILLECTOMY     TUBAL LIGATION      Social History   Socioeconomic History   Marital status: Single    Spouse name: Not on file   Number of children: Not on file   Years of education: Not on file   Highest education level: Not on file  Occupational History   Not on file  Tobacco Use   Smoking status: Every Day    Current packs/day: 1.00    Average packs/day: 1  pack/day for 40.0 years (40.0 ttl pk-yrs)    Types: Cigarettes   Smokeless tobacco: Current  Vaping Use   Vaping status: Some Days   Substances: Nicotine , Flavoring  Substance and Sexual Activity   Alcohol use: Yes    Comment: occ   Drug use: Yes    Types: Marijuana    Comment: once in while   Sexual activity: Not Currently  Other Topics Concern   Not on file  Social History Narrative   Not on file   Social Drivers of Health   Financial Resource Strain: Not on file  Food Insecurity: No Food Insecurity (05/17/2023)   Hunger Vital Sign    Worried About Running Out of Food in the Last Year: Never true    Ran Out of Food in the Last Year: Never true  Transportation Needs: No Transportation Needs (05/17/2023)   PRAPARE - Administrator, Civil Service (Medical): No    Lack of Transportation (Non-Medical): No  Physical Activity: Not on file  Stress: Not on file  Social Connections: Not on file  Intimate Partner Violence: Not At Risk (05/17/2023)   Humiliation, Afraid, Rape, and Kick questionnaire    Fear of Current or Ex-Partner: No    Emotionally Abused: No    Physically Abused: No    Sexually Abused:  No    Family History  Problem Relation Age of Onset   Fibromyalgia Mother     Allergies  Allergen Reactions   Haloperidol      Made pt feel really weird.     Hydrocodone -Acetaminophen  Nausea And Vomiting and Nausea Only   Morphine  Nausea And Vomiting    Patient states this medication makes her have sever flu symptoms.   Morphine  And Codeine Nausea And Vomiting    Patient states this medication makes her have sever flu symptoms.   Pravastatin     CHEST PAIN   Pravastatin Sodium     Unknown    Promethazine  Hcl     Unknown     Outpatient Medications Prior to Visit  Medication Sig   albuterol  (VENTOLIN  HFA) 108 (90 Base) MCG/ACT inhaler Inhale 2 puffs into the lungs every 6 (six) hours as needed for wheezing or shortness of breath.   atorvastatin   (LIPITOR) 10 MG tablet Take 1 tablet (10 mg total) by mouth at bedtime. (Patient not taking: Reported on 09/01/2023)   Blood Glucose Monitoring Suppl (ACCU-CHEK GUIDE) w/Device KIT Use in the morning, at noon, and at bedtime.   clonazePAM  (KLONOPIN ) 1 MG tablet Take 1 mg by mouth 2 (two) times daily as needed for anxiety.   dexlansoprazole  (DEXILANT ) 60 MG capsule Take 1 capsule (60 mg total) by mouth daily.   dicyclomine  (BENTYL ) 20 MG tablet Take 1 tablet (20 mg total) by mouth 2 (two) times daily.   gabapentin  (NEURONTIN ) 300 MG capsule Take 1 capsule (300 mg total) by mouth 3 (three) times daily. (Patient not taking: Reported on 03/15/2024)   hydrOXYzine  (ATARAX ) 50 MG tablet Take 1-2 tablets (50-100 mg total) by mouth at bedtime as needed for anxiety (sleep).   insulin  aspart (NOVOLOG ) 100 UNIT/ML FlexPen Use as directed per sliding scale. CBG 70 - 120: 0 units. CBG 121 - 150: 1 unit. CBG 151 - 200: 2 units. CBG 201 - 250: 3 units. CBG 251 - 300: 5 units. CBG 301 - 350: 7 units. CBG 351 - 400: 9 units   Insulin  Pen Needle 31G X 6 MM MISC Use to inject insulin  as directed 3 (three) times daily before meals.   ipratropium-albuterol  (DUONEB) 0.5-2.5 (3) MG/3ML SOLN Take 3 mLs by nebulization every 6 (six) hours as needed.   lidocaine  (XYLOCAINE ) 5 % ointment Apply 1 Application topically daily as needed for mild pain (pain score 1-3) or moderate pain (pain score 4-6).   MAGNESIUM  PO Take 1 tablet by mouth daily.   Menthol-Methyl Salicylate (MUSCLE RUB) 10-15 % CREA Apply 1 Application topically as needed.   ondansetron  (ZOFRAN -ODT) 4 MG disintegrating tablet DISSOLVE 1 TABLET ON THE TONGUE EVERY 6 HOURS AS NEEDED FOR NAUSEA AND VOMITING   POTASSIUM PO Take 1 tablet by mouth daily.   trimethoprim -polymyxin b  (POLYTRIM ) ophthalmic solution Place 1 drop into the right eye every 4 (four) hours. (Patient not taking: Reported on 03/15/2024)   [DISCONTINUED] cetirizine  (ZYRTEC ) 10 MG tablet TAKE 1 TABLET BY  MOUTH ONCE DAILY   [DISCONTINUED] fluticasone  (FLONASE ) 50 MCG/ACT nasal spray Place 1 spray into both nostrils daily.   [DISCONTINUED] rOPINIRole  (REQUIP ) 2 MG tablet Take 1 tablet (2 mg total) by mouth in the morning and at bedtime.   No facility-administered medications prior to visit.    Review of Systems  Constitutional:  Negative for weight loss.  HENT:  Positive for congestion.   Respiratory:  Negative for shortness of breath and wheezing.   Cardiovascular:  Negative for chest pain.  Psychiatric/Behavioral:  The patient does not have insomnia.        Objective:   BP 122/72   Pulse (!) 103   Ht 5' 6 (1.676 m)   Wt 150 lb 9.6 oz (68.3 kg)   SpO2 96%   BMI 24.31 kg/m   Vitals:   03/17/24 1151  BP: 122/72  Pulse: (!) 103  Height: 5' 6 (1.676 m)  Weight: 150 lb 9.6 oz (68.3 kg)  SpO2: 96%  BMI (Calculated): 24.32    Physical Exam Vitals reviewed.  Constitutional:      General: She is not in acute distress. HENT:     Head: Normocephalic.     Nose: Nose normal.     Mouth/Throat:     Mouth: Mucous membranes are moist.  Eyes:     Extraocular Movements: Extraocular movements intact.     Pupils: Pupils are equal, round, and reactive to light.  Cardiovascular:     Rate and Rhythm: Normal rate and regular rhythm.     Heart sounds: No murmur heard. Pulmonary:     Effort: Pulmonary effort is normal.     Breath sounds: No rhonchi or rales.  Abdominal:     General: Abdomen is flat.     Palpations: There is no hepatomegaly, splenomegaly or mass.  Musculoskeletal:        General: Normal range of motion.     Cervical back: Normal range of motion. No tenderness.  Skin:    General: Skin is warm and dry.  Neurological:     General: No focal deficit present.     Mental Status: She is alert and oriented to person, place, and time.     Cranial Nerves: No cranial nerve deficit.     Motor: No weakness.  Psychiatric:        Mood and Affect: Mood normal.         Behavior: Behavior normal.      Results for orders placed or performed in visit on 03/17/24  POCT CBG (Fasting - Glucose)  Result Value Ref Range   Glucose Fasting, POC 268 (A) 70 - 99 mg/dL    Recent Results (from the past 2160 hours)  Glucose, capillary     Status: Abnormal   Collection Time: 03/15/24 10:28 AM  Result Value Ref Range   Glucose-Capillary 129 (H) 70 - 99 mg/dL    Comment: Glucose reference range applies only to samples taken after fasting for at least 8 hours.  POCT CBG (Fasting - Glucose)     Status: Abnormal   Collection Time: 03/17/24 11:57 AM  Result Value Ref Range   Glucose Fasting, POC 268 (A) 70 - 99 mg/dL      Assessment & Plan:  Kinzy was seen today for follow-up.  Type 2 diabetes mellitus with hyperglycemia, without long-term current use of insulin  (HCC) -     POCT CBG (Fasting - Glucose) -     Hemoglobin A1c  Seasonal allergic rhinitis due to pollen -     Cetirizine  HCl; Take 1 tablet (10 mg total) by mouth daily.  Dispense: 30 tablet; Refill: 2 -     Fluticasone  Propionate; Place 1 spray into both nostrils daily.  Dispense: 16 mL; Refill: 2 -     Montelukast  Sodium; Take 1 tablet (10 mg total) by mouth daily.  Dispense: 30 tablet; Refill: 2  Restless legs syndrome (RLS) -     rOPINIRole  HCl; Take 1 tablet (2 mg total)  by mouth in the morning and at bedtime.  Dispense: 60 tablet; Refill: 2    Problem List Items Addressed This Visit       Endocrine   DM2 (diabetes mellitus, type 2) (HCC) - Primary   Relevant Orders   POCT CBG (Fasting - Glucose) (Completed)   Hemoglobin A1c   Other Visit Diagnoses       Seasonal allergic rhinitis due to pollen       Relevant Medications   cetirizine  (ZYRTEC ) 10 MG tablet   fluticasone  (FLONASE ) 50 MCG/ACT nasal spray   montelukast  (SINGULAIR ) 10 MG tablet     Restless legs syndrome (RLS)       Relevant Medications   rOPINIRole  (REQUIP ) 2 MG tablet       No follow-ups on file.   Total time  spent: 20 minutes. This time includes review of previous notes and results and patient face to face interaction during today'Anuja Manka visit.    Sherrill Cinderella Perry, MD  03/17/2024   This document may have been prepared by [facility] Voice Recognition software and as such may include unintentional dictation errors.

## 2024-03-18 LAB — HEMOGLOBIN A1C
Est. average glucose Bld gHb Est-mCnc: 131 mg/dL
Hgb A1c MFr Bld: 6.2 % — ABNORMAL HIGH (ref 4.8–5.6)

## 2024-03-20 ENCOUNTER — Other Ambulatory Visit: Payer: Self-pay | Admitting: Internal Medicine

## 2024-03-21 ENCOUNTER — Other Ambulatory Visit: Payer: Self-pay

## 2024-03-21 ENCOUNTER — Ambulatory Visit: Payer: Self-pay | Admitting: Internal Medicine

## 2024-03-21 MED ORDER — DEXLANSOPRAZOLE 60 MG PO CPDR: 1.0000 | DELAYED_RELEASE_CAPSULE | Freq: Every day | ORAL | 3 refills | Status: AC

## 2024-03-21 NOTE — Progress Notes (Signed)
Pt informed

## 2024-03-22 ENCOUNTER — Telehealth: Payer: Self-pay

## 2024-03-22 NOTE — Telephone Encounter (Signed)
 Pt states [facility] is requesting a PA for her dexilant .

## 2024-03-28 NOTE — Telephone Encounter (Signed)
 Patient called again, spoke with crystal pt advised I just received the PA yesterday after the holiday and I will do it as soon as I can, I do them in the order I get them, Mandie at [facility] called about this as well since the patient hung up with us  and called her immediately and they told her the same thing it will take a few days

## 2024-03-28 NOTE — Telephone Encounter (Signed)
 Pt called again asking for PA, will work on them today

## 2024-04-18 ENCOUNTER — Emergency Department
Admission: EM | Admit: 2024-04-18 | Discharge: 2024-04-18 | Disposition: A | Discharge status: Home / Self Care | Payer: MEDICAID | Source: Ambulatory Visit

## 2024-04-18 NOTE — Progress Notes (Signed)
 Back Pain Patient states that she has back pain.  This is not new, has had this for several months.  Low back pain and bilateral leg pain and hip pain  no numbness, tingling, weakness. No incontinence.  Has seen rheumatology.  She is disabled per patient. Has tried tylenol  and ibuprofen .  Waking up a lot at night due to pain.     Send To ED Sent to ED for 10/10 back pain. Per KL.

## 2024-05-02 ENCOUNTER — Ambulatory Visit: Payer: MEDICAID | Admitting: Nurse Practitioner

## 2024-05-10 ENCOUNTER — Other Ambulatory Visit: Payer: Self-pay | Admitting: Internal Medicine

## 2024-05-15 ENCOUNTER — Emergency Department
Admission: EM | Admit: 2024-05-15 | Discharge: 2024-05-15 | Disposition: A | Discharge status: Home / Self Care | Payer: MEDICAID | Attending: Emergency Medicine | Admitting: Emergency Medicine

## 2024-05-15 ENCOUNTER — Encounter: Payer: Self-pay | Admitting: *Deleted

## 2024-05-15 ENCOUNTER — Other Ambulatory Visit: Payer: Self-pay

## 2024-05-15 DIAGNOSIS — E119 Type 2 diabetes mellitus without complications: Secondary | ICD-10-CM | POA: Insufficient documentation

## 2024-05-15 DIAGNOSIS — J449 Chronic obstructive pulmonary disease, unspecified: Secondary | ICD-10-CM | POA: Diagnosis not present

## 2024-05-15 DIAGNOSIS — M5431 Sciatica, right side: Secondary | ICD-10-CM

## 2024-05-15 DIAGNOSIS — M5441 Lumbago with sciatica, right side: Secondary | ICD-10-CM | POA: Insufficient documentation

## 2024-05-15 DIAGNOSIS — M545 Low back pain, unspecified: Secondary | ICD-10-CM | POA: Diagnosis present

## 2024-05-15 MED ORDER — PREDNISONE 20 MG PO TABS
60.0000 mg | ORAL_TABLET | Freq: Once | ORAL | Status: AC
  Administered 2024-05-15: 60 mg via ORAL
  Filled 2024-05-15: qty 3

## 2024-05-15 MED ORDER — PREDNISONE 10 MG (21) PO TBPK: ORAL_TABLET | ORAL | 0 refills | Status: AC

## 2024-05-15 MED ORDER — LIDOCAINE 5 % EX PTCH
1.0000 | MEDICATED_PATCH | CUTANEOUS | Status: DC
  Administered 2024-05-15: 1 via TRANSDERMAL
  Filled 2024-05-15: qty 1

## 2024-05-15 MED ORDER — ONDANSETRON 4 MG PO TBDP
4.0000 mg | ORAL_TABLET | Freq: Once | ORAL | Status: AC
  Administered 2024-05-15: 4 mg via ORAL
  Filled 2024-05-15: qty 1

## 2024-05-15 MED ORDER — LIDOCAINE 5 % EX PTCH: 1.0000 | MEDICATED_PATCH | CUTANEOUS | 0 refills | Status: AC

## 2024-05-15 NOTE — ED Provider Notes (Addendum)
 "  [facility] Provider Note    Event Date/Time   First MD Initiated Contact with Patient 05/15/24 2102     (approximate)   History   Leg Pain   HPI  Shawna [name] is a 59 y.o. female  with a past medical history of opioid use disorder, anxiety, COPD, MDD, cannabis use disorder, GERD, Type 2 DM, sciatica of right side with bulging lumbar disc, PUD presents to the emergency department with lumbar back pain radiating into the right leg that has been present for approximately 1 month.  Patient denies chest pain, shortness of breath, abdominal pain, fall or injury, saddle anesthesia, bowel or bladder incontinence, urinary retention, fever or chills, numbness of her extremities. She has been using ibuprofen  at home for pain without relief. Reports her blood glucose has been controlled at home for her T2DM. Chart shows last A1c in late November was 6.2.  Physical Exam   Triage Vital Signs: ED Triage Vitals  Encounter Vitals Group     BP 05/15/24 1950 120/75     Girls Systolic BP Percentile --      Girls Diastolic BP Percentile --      Boys Systolic BP Percentile --      Boys Diastolic BP Percentile --      Pulse Rate 05/15/24 1950 79     Resp 05/15/24 1950 18     Temp 05/15/24 1950 97.9 F (36.6 C)     Temp Source 05/15/24 1950 Oral     SpO2 05/15/24 1950 96 %     Weight 05/15/24 1951 157 lb (71.2 kg)     Height 05/15/24 1951 5' 6 (1.676 m)     Head Circumference --      Peak Flow --      Pain Score 05/15/24 1954 9     Pain Loc --      Pain Education --      Exclude from Growth Chart --     Most recent vital signs: Vitals:   05/15/24 1950  BP: 120/75  Pulse: 79  Resp: 18  Temp: 97.9 F (36.6 C)  SpO2: 96%   General: Well-appearing, in no acute distress. Appears stated age. Head: Normocephalic, atraumatic. CV: Regular rate, 79 bpm.  Good peripheral perfusion. Dorsalis pedis pulses 2+ bilaterally. <2 second capillary refill. Respiratory: Breath  sounds clear b/l. No wheezes, rales, or rhonchi. No respiratory distress. Normal respiratory effort. Skin:Warm, dry, intact. No rashes, lesions, or ecchymosis.  Neurological: A&Ox4 to person, place, time, and situation. Sensation intact and equal to L4, L5, and S1.  GI: Soft, non-distended, non-tender. No rebound or guarding.  MSK: No midline lumbar spinal tenderness or paraspinal tenderness. Normal ROM with knee extension, dorsiflexion, and plantarflexion and 5/5 strength in bilateral lower extremities. No swelling or obvious deformities.  Straight leg raise is positive on the right side. No CVA tenderness bilaterally.   ED Results / Procedures / Treatments   Labs (all labs ordered are listed, but only abnormal results are displayed) Labs Reviewed - No data to display   EKG    RADIOLOGY    PROCEDURES:  Critical Care performed: No   Procedures   MEDICATIONS ORDERED IN ED: Medications  predniSONE  (DELTASONE ) tablet 60 mg (has no administration in time range)  ondansetron  (ZOFRAN -ODT) disintegrating tablet 4 mg (has no administration in time range)  lidocaine  (LIDODERM ) 5 % 1 patch (has no administration in time range)     IMPRESSION / MDM / ASSESSMENT  AND PLAN / ED COURSE  I reviewed the triage vital signs and the nursing notes.                              Differential diagnosis includes, but is not limited to, lumbar radiculopathy/sciatica, MSK strain, osteoarthritis, less likely herniated disc  Patient's presentation is most consistent with exacerbation of chronic illness.  Clinical Course as of 05/15/24 2209  Mon May 15, 2024  2118 Patient is here for 1 month of right leg pain radiating from her lumbar region with history of the same. Afebrile. No hx of injury. Able to ambulate on her own. No red flag symptoms of back pain. No foot drop. Normal neurological exam with equal strength and sensation in b/l lower extremities. Overall well appearing. Positive SLR on the  right. Will do Tylenol , lidocaine  patch, short prednisone  taper and have her f/u with her PCP following today's visit. She has not had an allergic reaction with taking Tylenol  in the past; only with Norco. She requested nausea medicine as she states most pain medications give her nausea, so will send Rx for her to use as needed. Told to take medicines with food. [SD]    Clinical Course User Index [SD] Sheron Salm, PA-C   The patient may return to the emergency department for any new, worsening, or concerning symptoms. Patient was given the opportunity to ask questions; all questions were answered. Emergency department return precautions were discussed with the patient.  Patient is in agreement to the treatment plan.  Patient is stable for discharge.   FINAL CLINICAL IMPRESSION(S) / ED DIAGNOSES   Final diagnoses:  Sciatica of right side     Rx / DC Orders   ED Discharge Orders          Ordered    predniSONE  (STERAPRED UNI-PAK 21 TAB) 10 MG (21) TBPK tablet        05/15/24 2143    lidocaine  (LIDODERM ) 5 %  Every 24 hours        05/15/24 2143             Note:  This document was prepared using Dragon voice recognition software and may include unintentional dictation errors.     Sheron Salm, PA-C 05/15/24 2208    Sheron Salm, PA-C 05/15/24 2211    Suzanne Kirsch, MD 05/15/24 2346  "

## 2024-05-15 NOTE — Discharge Instructions (Addendum)
 You were seen in the emergency department for right leg pain.  I do believe this is coming from your back.  Please pick up and take the medications as prescribed.  You may take these medications with over-the-counter Tylenol  and ibuprofen  for pain unless you have an allergy of if your doctor has previously told you not to take them.  I have sent in nausea medication to help as well if these medicines cause you to become nauseous.  Follow-up with your primary care provider following today's visit.   Return to the ED immediately if you develop any of the following problems: -- Leaking urine or difficulty urinating; -- Inability to control your bowels; -- New numbness or weakness in your legs or numbness between your legs; -- Inability to walk -- Fever

## 2024-05-15 NOTE — ED Triage Notes (Signed)
 Pt ambulatory to triage.  Pt has right upper leg pain.  Sx for 1 month.  No known injury  denies urinary sx.   Pt alert  speech clear

## 2024-05-16 ENCOUNTER — Ambulatory Visit: Payer: MEDICAID | Admitting: Internal Medicine

## 2024-05-16 VITALS — BP 126/74 | HR 108 | Temp 97.9°F | Ht 66.0 in | Wt 156.4 lb

## 2024-05-16 DIAGNOSIS — Z0130 Encounter for examination of blood pressure without abnormal findings: Secondary | ICD-10-CM

## 2024-05-16 DIAGNOSIS — M5126 Other intervertebral disc displacement, lumbar region: Secondary | ICD-10-CM

## 2024-05-16 DIAGNOSIS — J449 Chronic obstructive pulmonary disease, unspecified: Secondary | ICD-10-CM

## 2024-05-16 DIAGNOSIS — E1165 Type 2 diabetes mellitus with hyperglycemia: Secondary | ICD-10-CM

## 2024-05-16 LAB — POCT CBG (FASTING - GLUCOSE)-MANUAL ENTRY: Glucose Fasting, POC: 141 mg/dL — AB (ref 70–99)

## 2024-05-16 MED ORDER — CELECOXIB 400 MG PO CAPS: 400.0000 mg | ORAL_CAPSULE | Freq: Every day | ORAL | 2 refills | Status: AC

## 2024-05-16 NOTE — Progress Notes (Signed)
 "  Established Patient Office Visit  Subjective:  Patient ID: Shawna [name], female    DOB: [birth year]  Age: 59 y.o. MRN: [id number]  Chief Complaint  Patient presents with   Acute Visit    Right leg and hip pain. Went to the hospital last night was treated for sciatica. Started around [facility] 04/20/24    ER follow up for lower back pain and right leg sciatica. Received prednisone  and lidoderm  patch with mild improvement but yet to pick up her tapering dose.    No other concerns at this time.   Past Medical History:  Diagnosis Date   Anxiety    Asthma    Benign hypermobility syndrome    Bulging lumbar disc    Bulging lumbar disc    COPD (chronic obstructive pulmonary disease) (HCC)    GERD (gastroesophageal reflux disease)    History of peptic ulcer disease    Homelessness 11/09/2022   IBS (irritable bowel syndrome)    Insomnia    Kidney stone    Pneumonia 03/2015   Polyarthralgia    Sciatic leg pain    Right side   Sciatica of right side    Weakness    Bil knees, and ankles    Past Surgical History:  Procedure Laterality Date   ABDOMINAL HYSTERECTOMY     Partial   CATARACT EXTRACTION W/PHACO Right 03/15/2024   Procedure: PHACOEMULSIFICATION, CATARACT, WITH IOL INSERTION 13.59, 01:27.4;  Surgeon: Georgia Curtistine PARAS, MD;  Location: [facility];  Service: Ophthalmology;  Laterality: Right;   ESOPHAGOGASTRODUODENOSCOPY (EGD) WITH PROPOFOL  N/A 11/27/2015   Procedure: ESOPHAGOGASTRODUODENOSCOPY (EGD) WITH PROPOFOL ;  Surgeon: Gladis RAYMOND Mariner, MD;  Location: [facility];  Service: Endoscopy;  Laterality: N/A;   SHOULDER SURGERY Right    TONSILLECTOMY     TUBAL LIGATION      Social History   Socioeconomic History   Marital status: Single    Spouse name: Not on file   Number of children: Not on file   Years of education: Not on file   Highest education level: Not on file  Occupational History   Not on file  Tobacco Use   Smoking status: Every Day     Current packs/day: 1.00    Average packs/day: 1 pack/day for 40.0 years (40.0 ttl pk-yrs)    Types: Cigarettes   Smokeless tobacco: Current  Vaping Use   Vaping status: Some Days   Substances: Nicotine , Flavoring  Substance and Sexual Activity   Alcohol use: Not Currently    Comment: occ   Drug use: Yes    Types: Marijuana    Comment: once in while   Sexual activity: Not Currently  Other Topics Concern   Not on file  Social History Narrative   Not on file   Social Drivers of Health   Tobacco Use: High Risk (05/15/2024)   Patient History    Smoking Tobacco Use: Every Day    Smokeless Tobacco Use: Current    Passive Exposure: Not on file  Financial Resource Strain: Not on file  Food Insecurity: No Food Insecurity (05/17/2023)   Hunger Vital Sign    Worried About Running Out of Food in the Last Year: Never true    Ran Out of Food in the Last Year: Never true  Transportation Needs: No Transportation Needs (05/17/2023)   PRAPARE - Administrator, Civil Service (Medical): No    Lack of Transportation (Non-Medical): No  Physical Activity: Not on file  Stress: Not  on file  Social Connections: Not on file  Intimate Partner Violence: Not At Risk (05/17/2023)   Humiliation, Afraid, Rape, and Kick questionnaire    Fear of Current or Ex-Partner: No    Emotionally Abused: No    Physically Abused: No    Sexually Abused: No  Depression (PHQ2-9): Not on file  Alcohol Screen: Not on file  Housing: Low Risk (05/17/2023)   Housing Stability Vital Sign    Unable to Pay for Housing in the Last Year: No    Number of Times Moved in the Last Year: 0    Homeless in the Last Year: No  Utilities: Not At Risk (05/17/2023)   [employer] Utilities    Threatened with loss of utilities: No  Health Literacy: Not on file    Family History  Problem Relation Age of Onset   Fibromyalgia Mother     Allergies[1]  Show/hide medication list[2]  Review of Systems  Constitutional:  Negative for  weight loss.  HENT:  Positive for congestion.   Respiratory:  Negative for shortness of breath and wheezing.   Cardiovascular:  Negative for chest pain.  Psychiatric/Behavioral:  The patient does not have insomnia.        Objective:   BP 126/74   Pulse (!) 108   Temp 97.9 F (36.6 C)   Ht 5' 6 (1.676 m)   Wt 156 lb 6.4 oz (70.9 kg)   SpO2 97%   BMI 25.24 kg/m   Vitals:   05/16/24 1549  BP: 126/74  Pulse: (!) 108  Temp: 97.9 F (36.6 C)  Height: 5' 6 (1.676 m)  Weight: 156 lb 6.4 oz (70.9 kg)  SpO2: 97%  BMI (Calculated): 25.26    Physical Exam Vitals reviewed.  Constitutional:      General: She is not in acute distress. HENT:     Head: Normocephalic.     Nose: Nose normal.     Mouth/Throat:     Mouth: Mucous membranes are moist.  Eyes:     Extraocular Movements: Extraocular movements intact.     Pupils: Pupils are equal, round, and reactive to light.  Cardiovascular:     Rate and Rhythm: Normal rate and regular rhythm.     Heart sounds: No murmur heard. Pulmonary:     Effort: Pulmonary effort is normal.     Breath sounds: No rhonchi or rales.  Abdominal:     General: Abdomen is flat.     Palpations: There is no hepatomegaly, splenomegaly or mass.  Musculoskeletal:        General: Normal range of motion.     Cervical back: Normal range of motion. No tenderness.  Skin:    General: Skin is warm and dry.  Neurological:     General: No focal deficit present.     Mental Status: She is alert and oriented to person, place, and time.     Cranial Nerves: No cranial nerve deficit.     Motor: No weakness.  Psychiatric:        Mood and Affect: Mood normal.        Behavior: Behavior normal.      Results for orders placed or performed in visit on 05/16/24  POCT CBG (Fasting - Glucose)  Result Value Ref Range   Glucose Fasting, POC 141 (A) 70 - 99 mg/dL        Assessment & Plan:  Jacee was seen today for acute visit.  Type 2 diabetes mellitus with  hyperglycemia, without  long-term current use of insulin  (HCC) -     POCT CBG (Fasting - Glucose)  Herniated lumbar intervertebral disc -     Celecoxib ; Take 1 capsule (400 mg total) by mouth daily after breakfast.  Dispense: 30 capsule; Refill: 2  Chronic obstructive pulmonary disease, unspecified COPD type (HCC)    Problem List Items Addressed This Visit       Respiratory   COPD (chronic obstructive pulmonary disease) (HCC)     Endocrine   DM2 (diabetes mellitus, type 2) (HCC) - Primary   Relevant Orders   POCT CBG (Fasting - Glucose) (Completed)   Other Visit Diagnoses       Herniated lumbar intervertebral disc       Relevant Medications   celecoxib  (CELEBREX ) 400 MG capsule       Return in about 2 weeks (around 05/30/2024) for Herniated disc f/u.   Total time spent: 20 minutes. This time includes review of previous notes and results and patient face to face interaction during today'Pauline Trainer visit.    Sherrill Cinderella Perry, MD  05/16/2024   This document may have been prepared by [facility] Voice Recognition software and as such may include unintentional dictation errors.     [1]  Allergies Allergen Reactions   Haloperidol      Made pt feel really weird.     Hydrocodone -Acetaminophen  Nausea And Vomiting and Nausea Only   Morphine  Nausea And Vomiting    Patient states this medication makes her have sever flu symptoms.   Morphine  And Codeine Nausea And Vomiting    Patient states this medication makes her have sever flu symptoms.   Pravastatin     CHEST PAIN   Pravastatin Sodium     Unknown    Promethazine  Hcl     Unknown   [2]  Outpatient Medications Prior to Visit  Medication Sig   albuterol  (VENTOLIN  HFA) 108 (90 Base) MCG/ACT inhaler INHALE 2 PUFFS INTO THE LUNGS EVERY 6 HOURS AS NEEDED FOR WHEEZING OR SHORTNESS OF BREATH   Blood Glucose Monitoring Suppl (ACCU-CHEK GUIDE) w/Device KIT Use in the morning, at noon, and at bedtime.   cetirizine  (ZYRTEC ) 10 MG  tablet Take 1 tablet (10 mg total) by mouth daily.   clonazePAM  (KLONOPIN ) 1 MG tablet Take 1 mg by mouth 2 (two) times daily as needed for anxiety.   dexlansoprazole  (DEXILANT ) 60 MG capsule Take 1 capsule (60 mg total) by mouth daily.   dicyclomine  (BENTYL ) 20 MG tablet Take 1 tablet (20 mg total) by mouth 2 (two) times daily.   fluticasone  (FLONASE ) 50 MCG/ACT nasal spray Place 1 spray into both nostrils daily.   hydrOXYzine  (ATARAX ) 50 MG tablet Take 1-2 tablets (50-100 mg total) by mouth at bedtime as needed for anxiety (sleep).   insulin  aspart (NOVOLOG ) 100 UNIT/ML FlexPen Use as directed per sliding scale. CBG 70 - 120: 0 units. CBG 121 - 150: 1 unit. CBG 151 - 200: 2 units. CBG 201 - 250: 3 units. CBG 251 - 300: 5 units. CBG 301 - 350: 7 units. CBG 351 - 400: 9 units   Insulin  Pen Needle 31G X 6 MM MISC Use to inject insulin  as directed 3 (three) times daily before meals.   ipratropium-albuterol  (DUONEB) 0.5-2.5 (3) MG/3ML SOLN Take 3 mLs by nebulization every 6 (six) hours as needed.   lidocaine  (LIDODERM ) 5 % Place 1 patch onto the skin daily for 10 days. Remove & Discard patch within 12 hours or as directed by MD  MAGNESIUM  PO Take 1 tablet by mouth daily.   montelukast  (SINGULAIR ) 10 MG tablet Take 1 tablet (10 mg total) by mouth daily.   ondansetron  (ZOFRAN -ODT) 4 MG disintegrating tablet DISSOLVE 1 TABLET ON THE TONGUE EVERY 6 HOURS AS NEEDED FOR NAUSEA AND VOMITING   POTASSIUM PO Take 1 tablet by mouth daily.   predniSONE  (STERAPRED UNI-PAK 21 TAB) 10 MG (21) TBPK tablet Take six pills on day 1, five pills on day 2, four pills on day 3, three pills on day 4, two pills on day 5, and one pill on day 6.   atorvastatin  (LIPITOR) 10 MG tablet Take 1 tablet (10 mg total) by mouth at bedtime. (Patient not taking: Reported on 05/16/2024)   gabapentin  (NEURONTIN ) 300 MG capsule Take 1 capsule (300 mg total) by mouth 3 (three) times daily. (Patient not taking: Reported on 05/16/2024)    Menthol-Methyl Salicylate (MUSCLE RUB) 10-15 % CREA Apply 1 Application topically as needed. (Patient not taking: Reported on 05/16/2024)   rOPINIRole  (REQUIP ) 2 MG tablet Take 1 tablet (2 mg total) by mouth in the morning and at bedtime. (Patient not taking: Reported on 05/16/2024)   trimethoprim -polymyxin b  (POLYTRIM ) ophthalmic solution Place 1 drop into the right eye every 4 (four) hours. (Patient not taking: Reported on 05/16/2024)   No facility-administered medications prior to visit.   "

## 2024-05-30 ENCOUNTER — Ambulatory Visit: Payer: MEDICAID | Admitting: Internal Medicine

## 2024-07-14 ENCOUNTER — Ambulatory Visit: Payer: MEDICAID | Admitting: Internal Medicine
